# Patient Record
Sex: Male | Born: 1945
Health system: Southern US, Community
[De-identification: ages and names within clinical notes are randomized; demographics above are authoritative.]

## PROBLEM LIST (undated history)

## (undated) DIAGNOSIS — I2510 Atherosclerotic heart disease of native coronary artery without angina pectoris: Secondary | ICD-10-CM

## (undated) DIAGNOSIS — I493 Ventricular premature depolarization: Secondary | ICD-10-CM

## (undated) DIAGNOSIS — I471 Supraventricular tachycardia: Secondary | ICD-10-CM

## (undated) DIAGNOSIS — I712 Thoracic aortic aneurysm, without rupture, unspecified: Secondary | ICD-10-CM

## (undated) DIAGNOSIS — I514 Myocarditis, unspecified: Secondary | ICD-10-CM

## (undated) DIAGNOSIS — M2550 Pain in unspecified joint: Secondary | ICD-10-CM

## (undated) DIAGNOSIS — I452 Bifascicular block: Secondary | ICD-10-CM

## (undated) DIAGNOSIS — M2540 Effusion, unspecified joint: Secondary | ICD-10-CM

## (undated) DIAGNOSIS — M1990 Unspecified osteoarthritis, unspecified site: Secondary | ICD-10-CM

## (undated) DIAGNOSIS — D649 Anemia, unspecified: Secondary | ICD-10-CM

## (undated) DIAGNOSIS — I4719 Other supraventricular tachycardia: Secondary | ICD-10-CM

## (undated) DIAGNOSIS — E785 Hyperlipidemia, unspecified: Secondary | ICD-10-CM

## (undated) DIAGNOSIS — I10 Essential (primary) hypertension: Secondary | ICD-10-CM

## (undated) DIAGNOSIS — G8929 Other chronic pain: Secondary | ICD-10-CM

## (undated) DIAGNOSIS — I491 Atrial premature depolarization: Secondary | ICD-10-CM

## (undated) DIAGNOSIS — M549 Dorsalgia, unspecified: Secondary | ICD-10-CM

## (undated) HISTORY — DX: Thoracic aortic aneurysm, without rupture, unspecified: I71.20

## (undated) HISTORY — PX: COLONOSCOPY: SHX174

## (undated) HISTORY — PX: JOINT REPLACEMENT: SHX530

## (undated) HISTORY — DX: Atrial premature depolarization: I49.1

## (undated) HISTORY — PX: OTHER SURGICAL HISTORY: SHX169

## (undated) HISTORY — DX: Other supraventricular tachycardia: I47.19

## (undated) HISTORY — DX: Atherosclerotic heart disease of native coronary artery without angina pectoris: I25.10

## (undated) HISTORY — DX: Ventricular premature depolarization: I49.3

## (undated) HISTORY — DX: Supraventricular tachycardia: I47.1

## (undated) HISTORY — PX: INGUINAL HERNIA REPAIR: SUR1180

## (undated) HISTORY — DX: Myocarditis, unspecified: I51.4

## (undated) HISTORY — DX: Bifascicular block: I45.2

---

## 2007-06-27 ENCOUNTER — Ambulatory Visit (HOSPITAL_COMMUNITY): Admission: RE | Admit: 2007-06-27 | Discharge: 2007-06-27 | Payer: Self-pay | Admitting: Orthopaedic Surgery

## 2007-06-27 IMAGING — CR DG CHEST 2V
2 series · 2 of 2 positions shown · non-contrast
Comparison: None.

CLINICAL DATA: Knee osteoarthritis and synovitis. Preop respiratory exam. Hypertension. Smoker. 
 CHEST - 2 VIEW:

[view not recorded (1 of 2)]
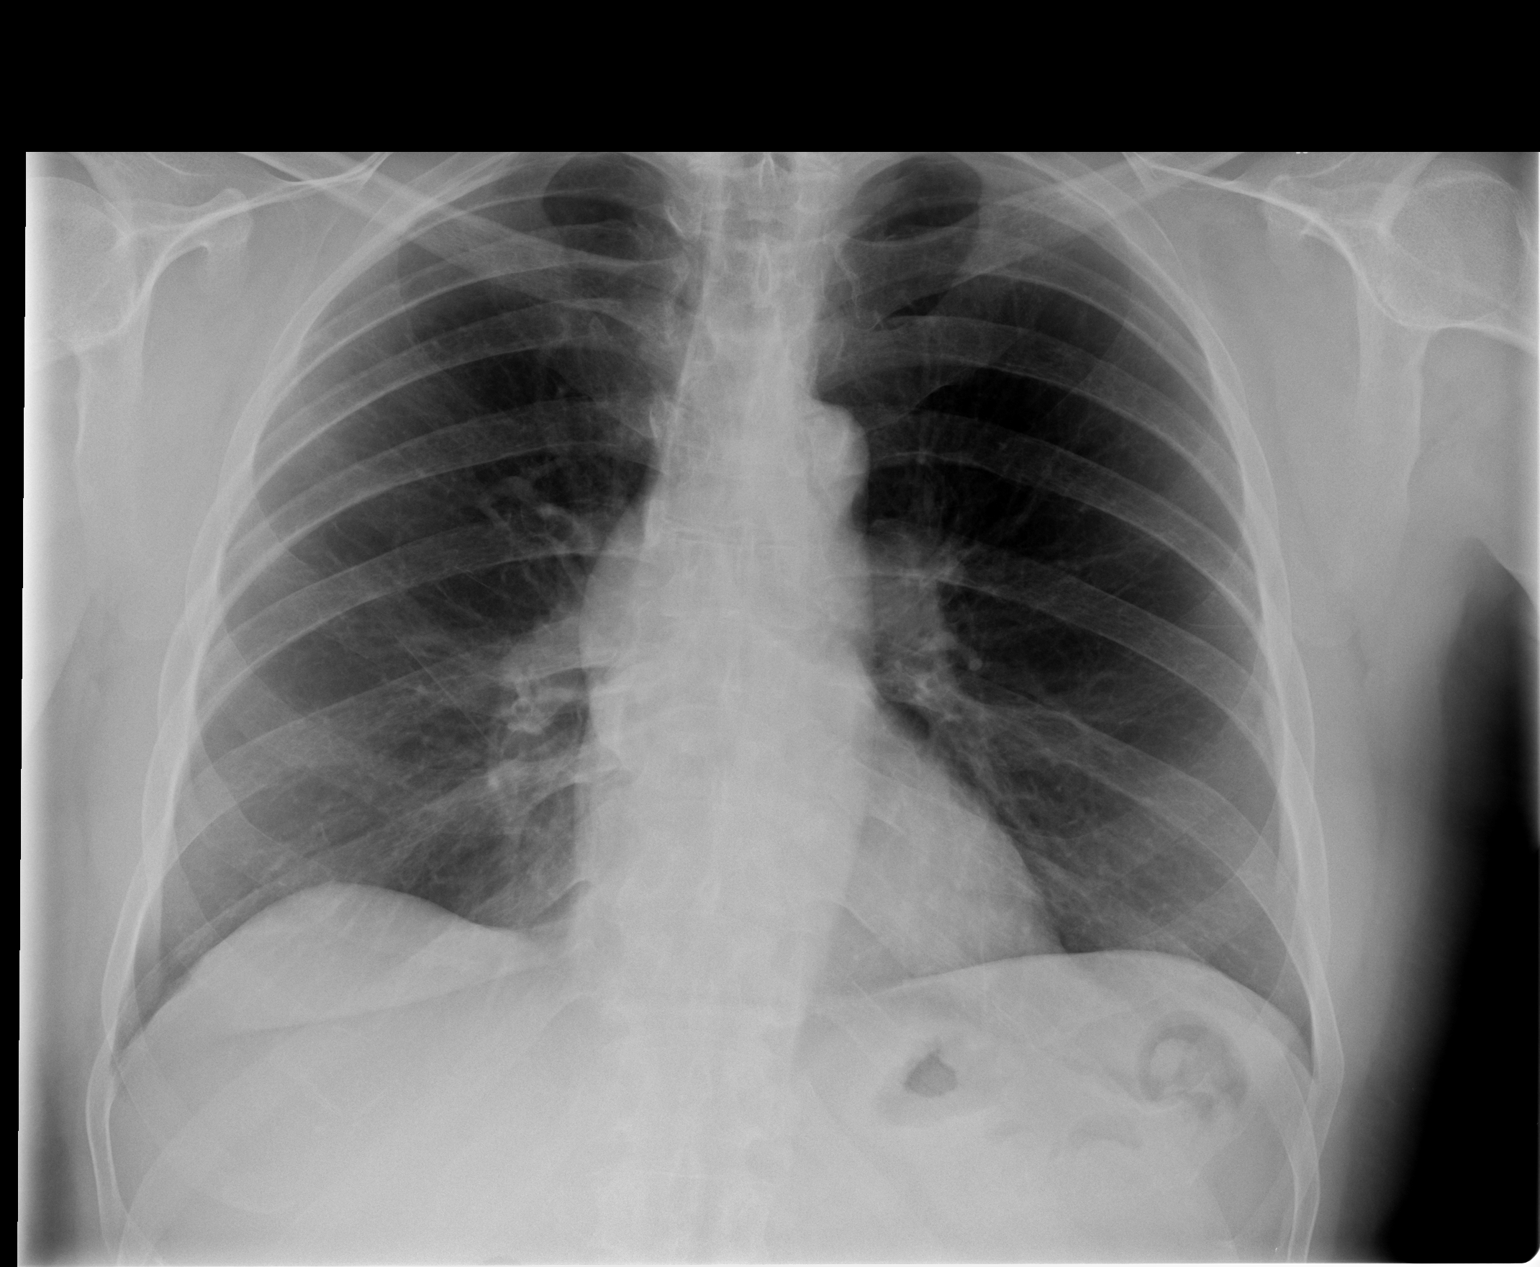

[view not recorded (2 of 2)]
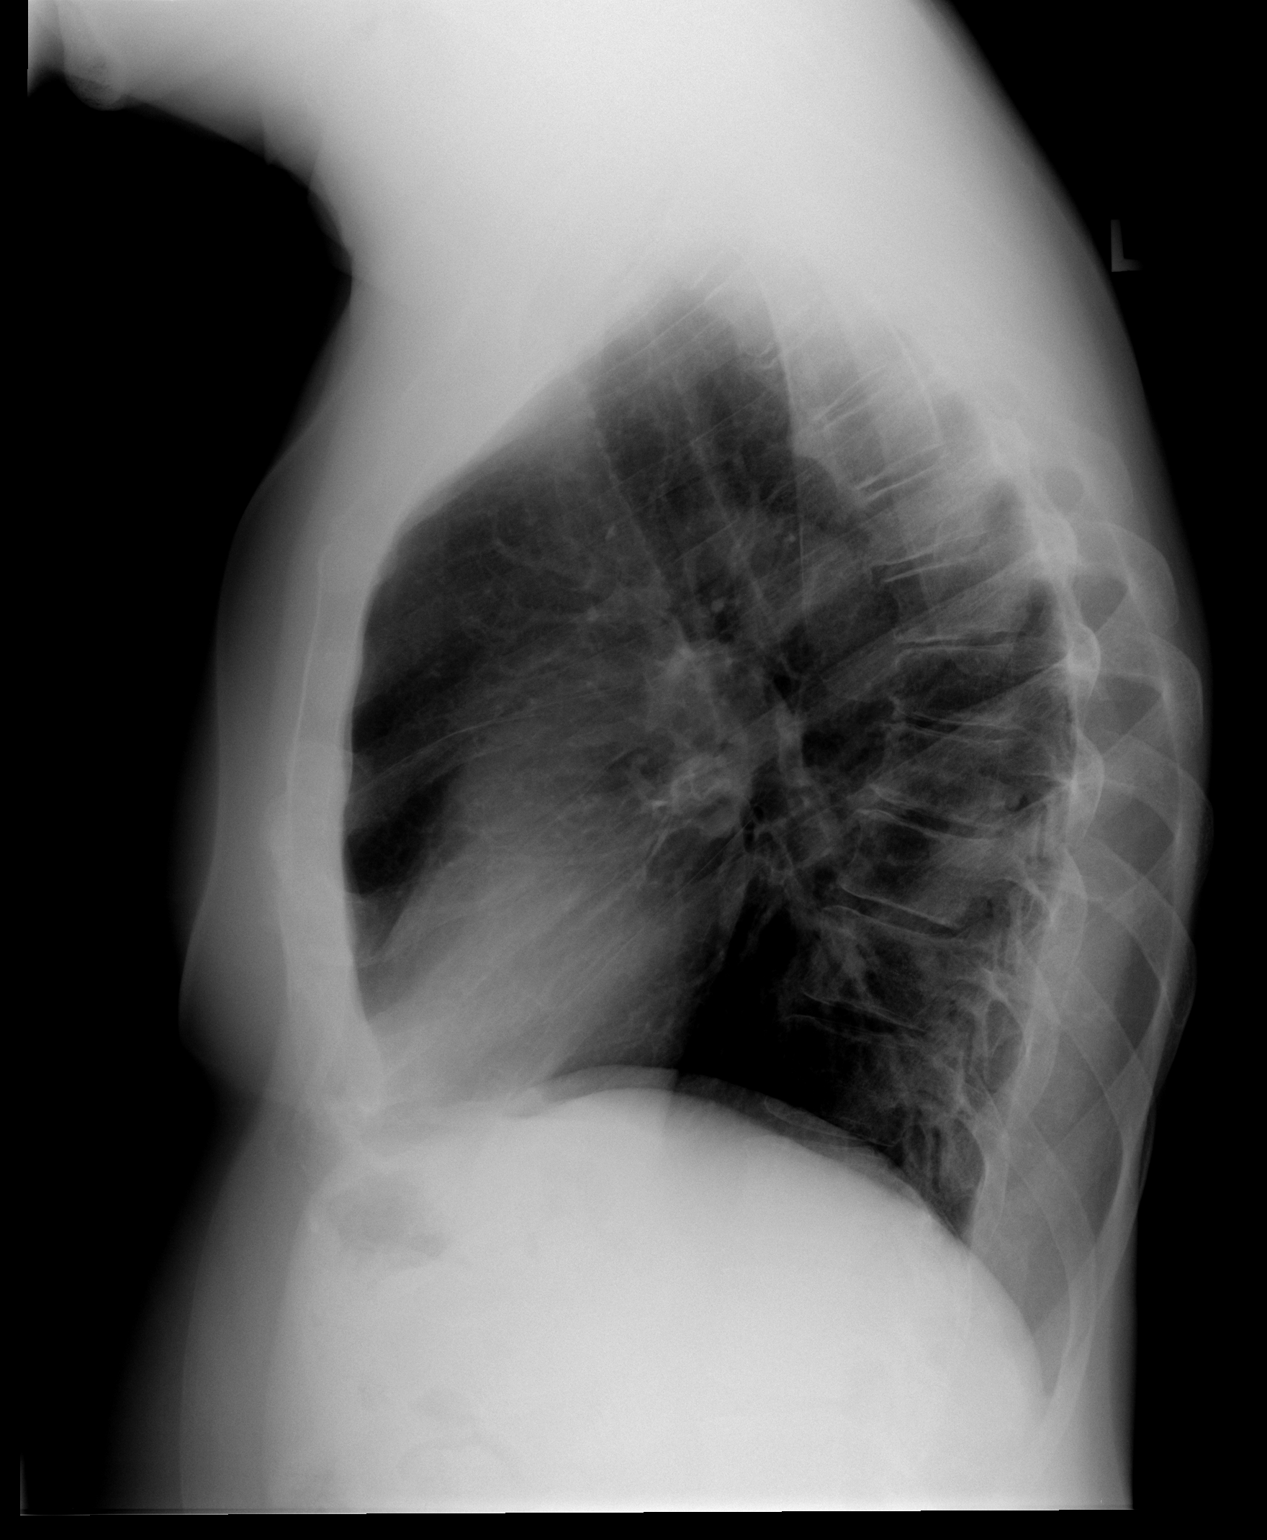

[2 of 2 positions shown; findings below may reference images not displayed]

FINDINGS: The heart size is normal. Mild tortuosity of the thoracic aorta is noted. Both lungs are clear. There is no evidence of pleural effusion. No mass or adenopathy identified.
IMPRESSION: No active cardiopulmonary disease.

## 2008-06-26 ENCOUNTER — Inpatient Hospital Stay (HOSPITAL_COMMUNITY): Admission: RE | Admit: 2008-06-26 | Discharge: 2008-06-30 | Payer: Self-pay | Admitting: Orthopaedic Surgery

## 2008-06-26 IMAGING — CR DG KNEE 1-2V PORT*R*
3 series · 3 of 3 positions shown · non-contrast
Comparison: None

CLINICAL DATA: Severe osteoarthritis of the right knee.  Status
post right knee replacement.

PORTABLE RIGHT KNEE - 1-2 VIEW

[view not recorded (1 of 3)]
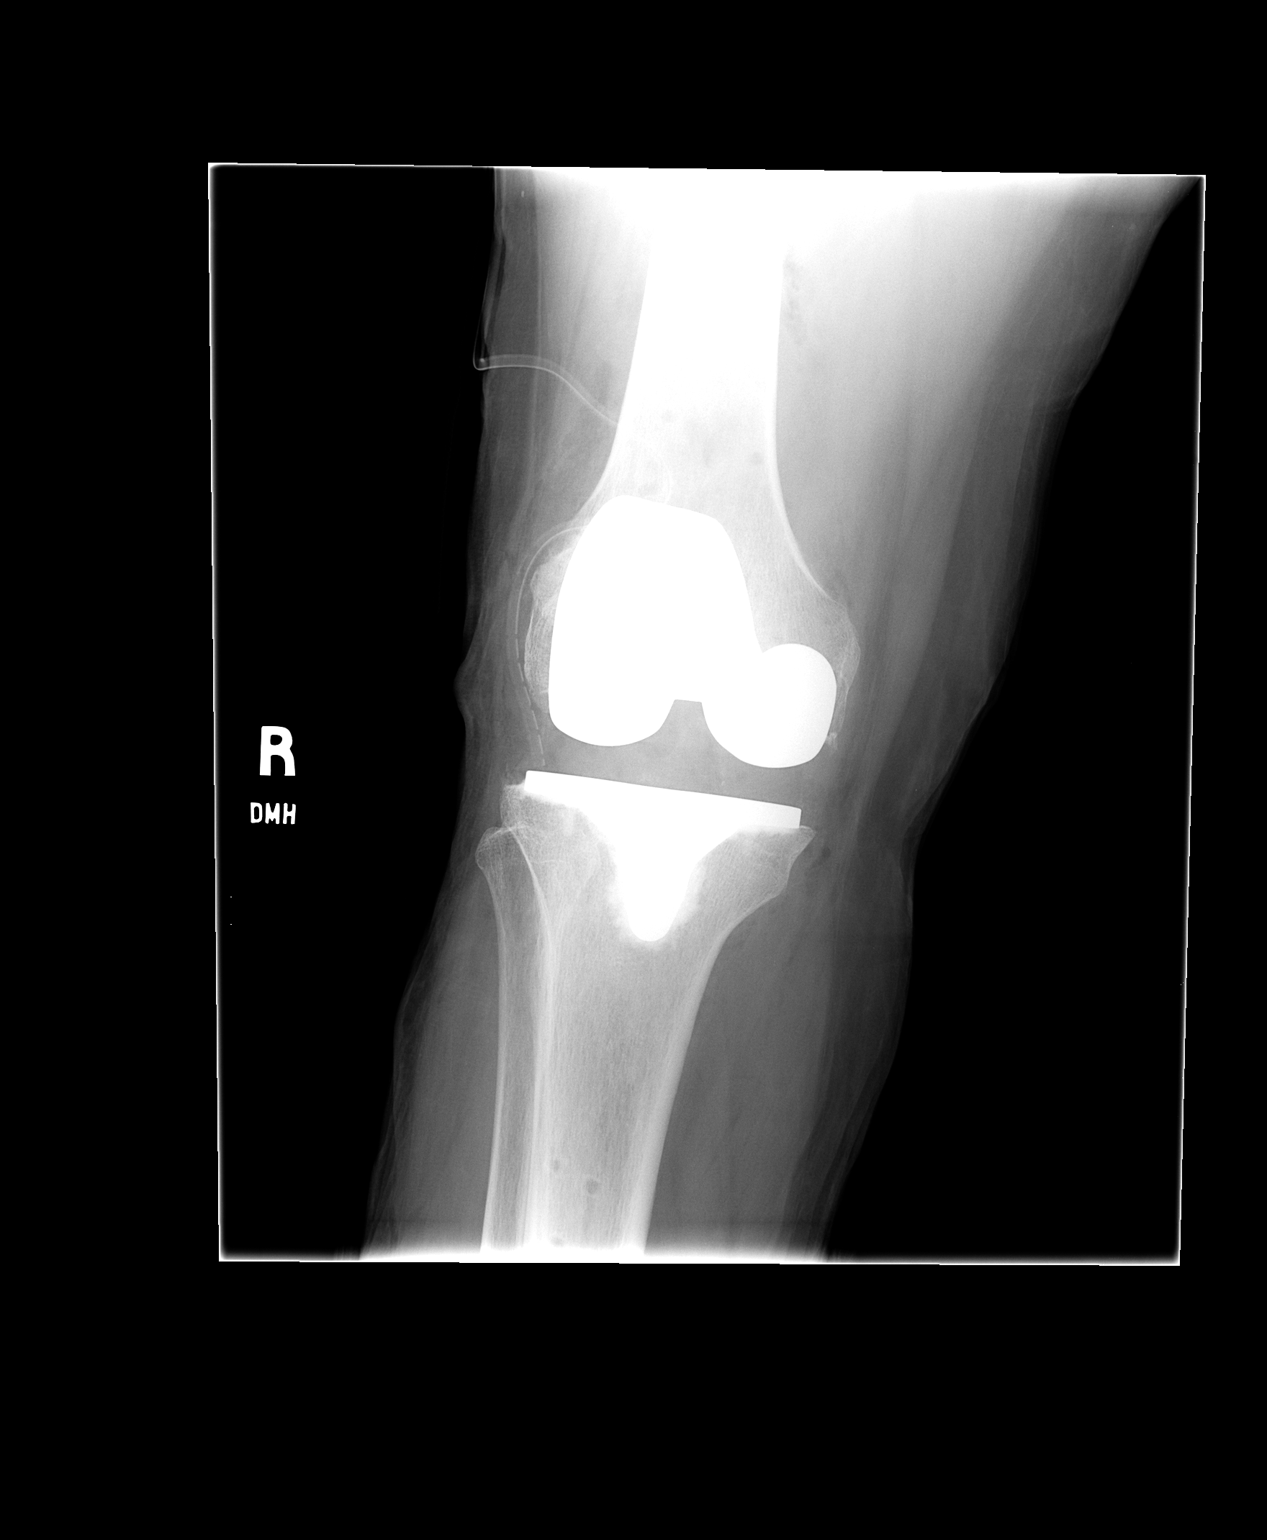

[view not recorded (2 of 3)]
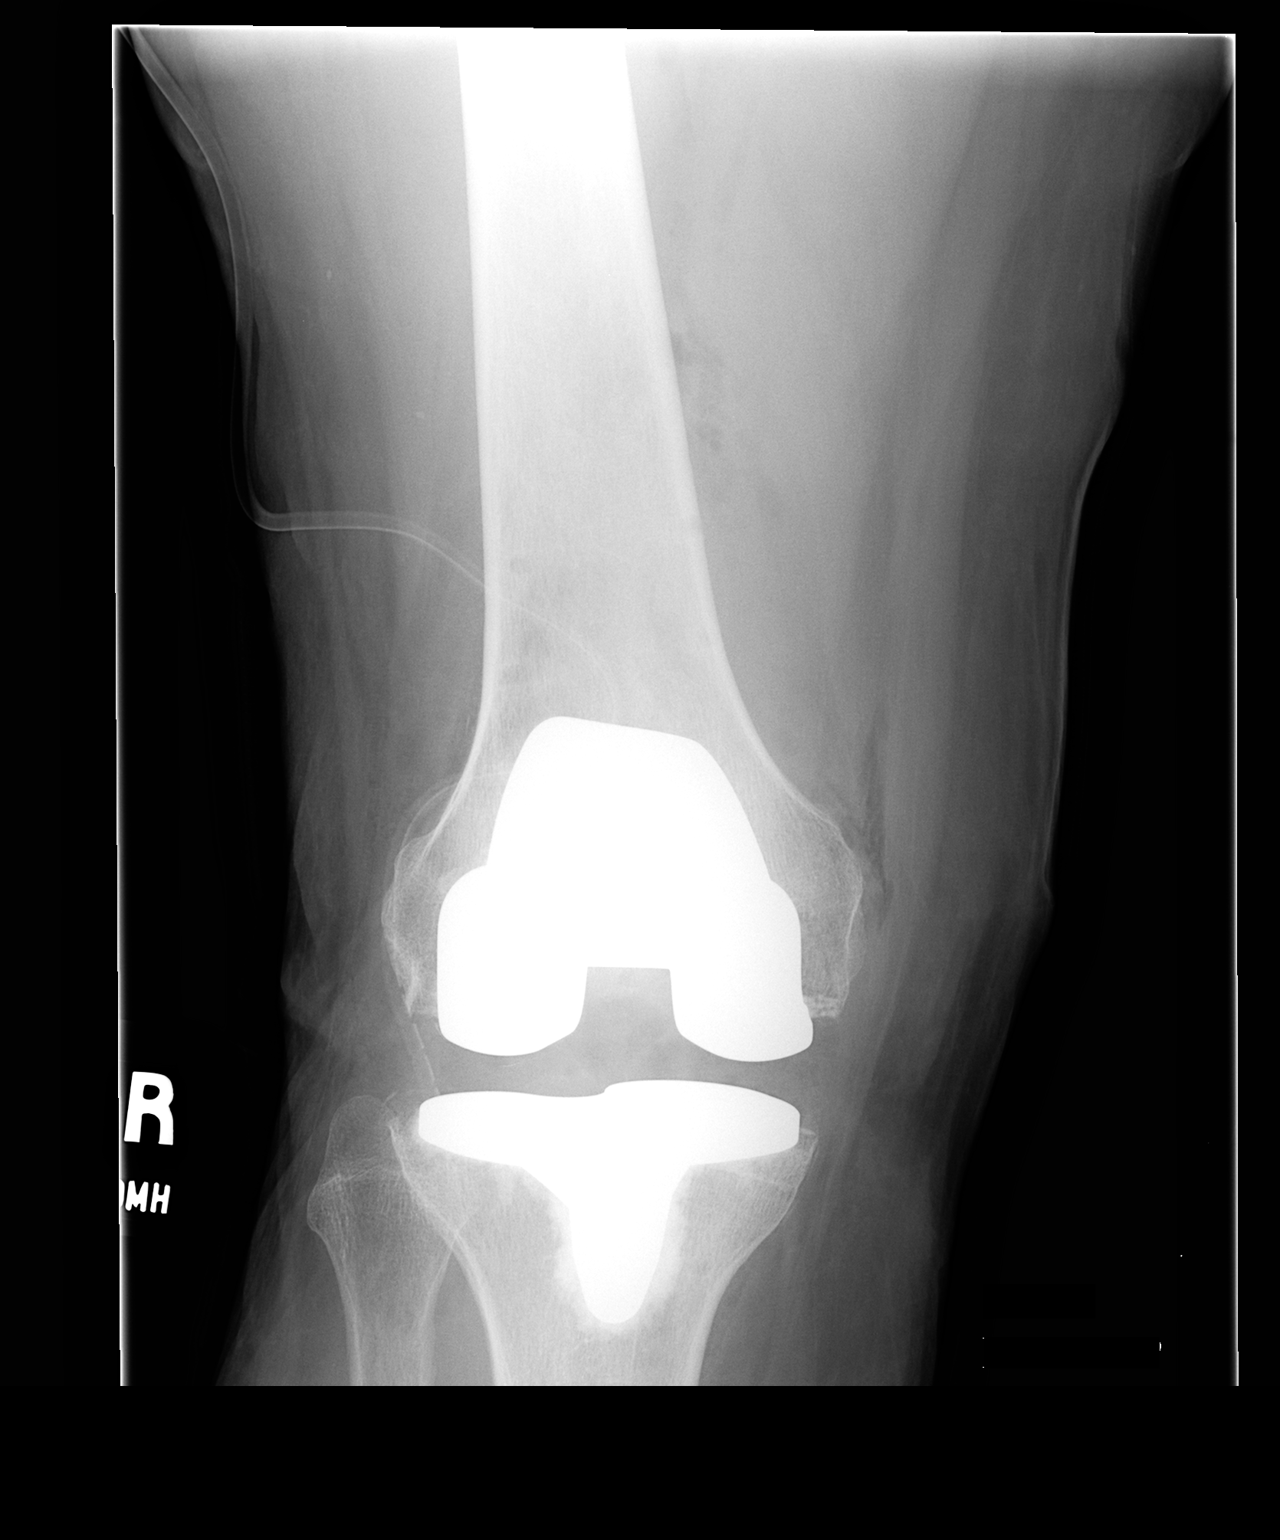

[view not recorded (3 of 3)]
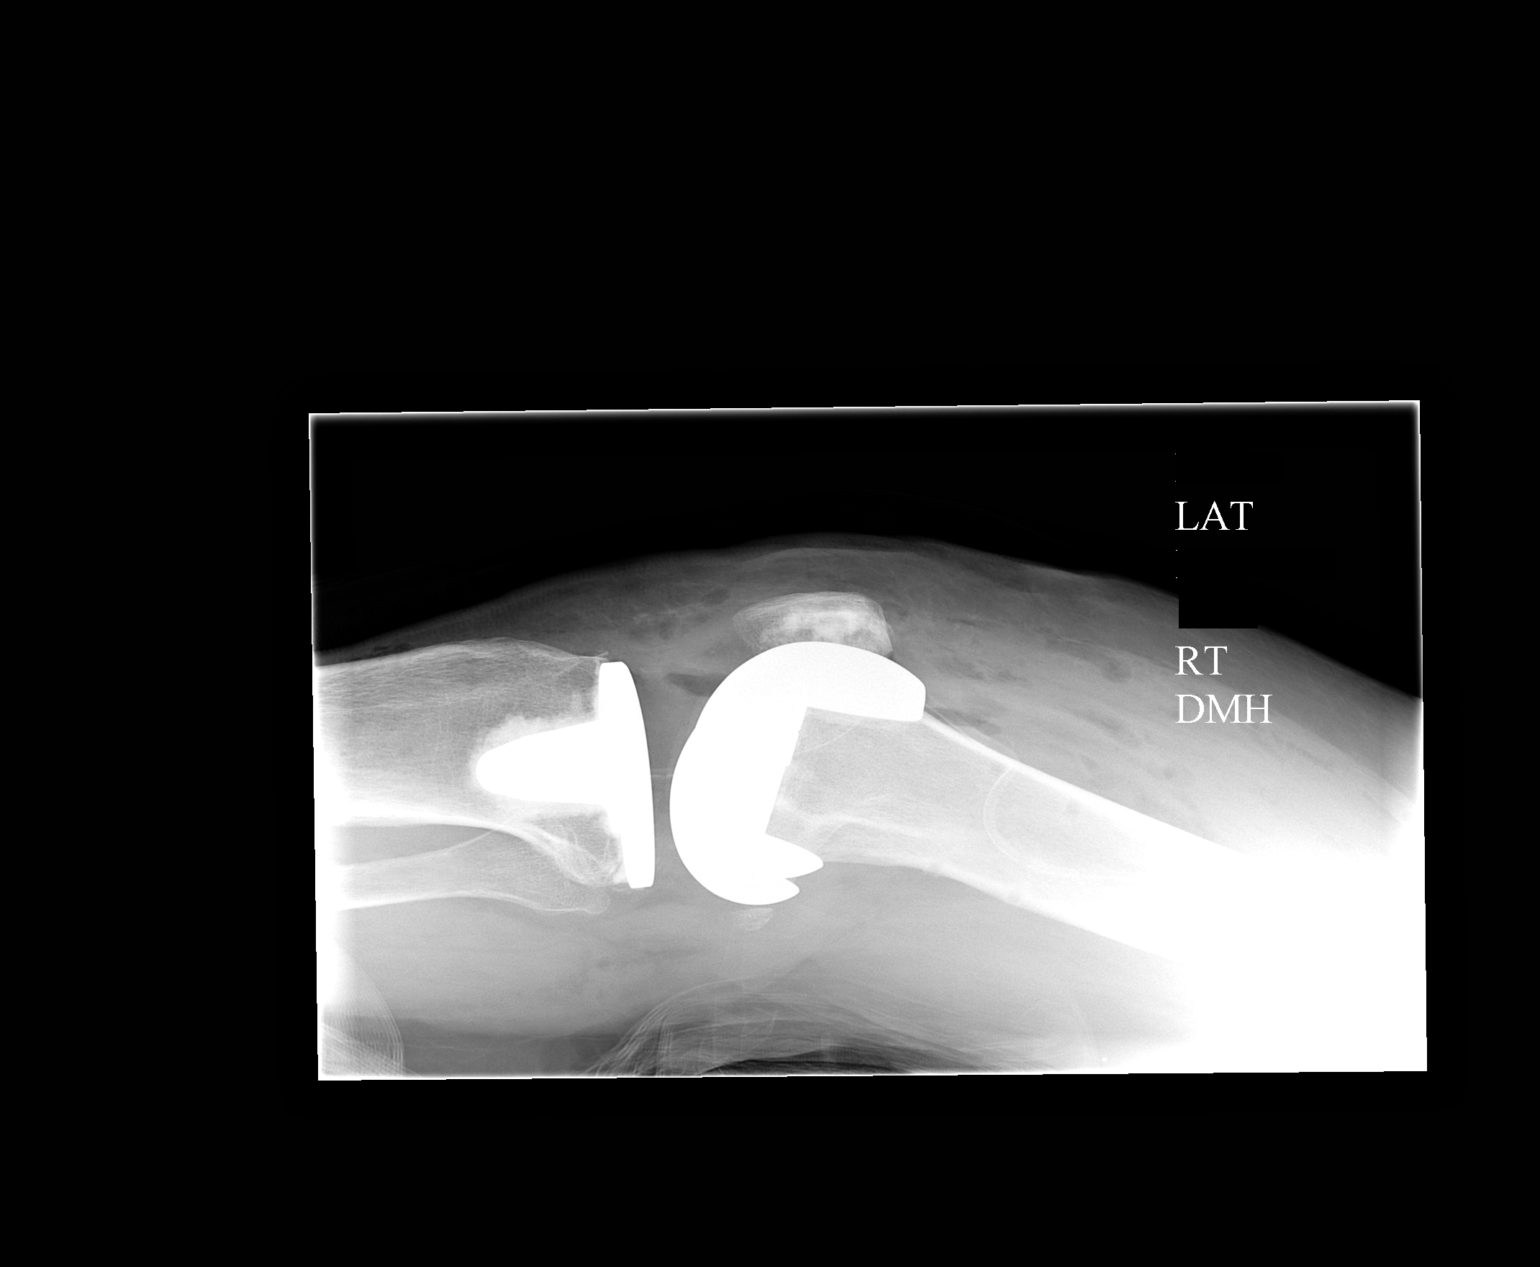

[3 of 3 positions shown; findings below may reference images not displayed]

FINDINGS: The components of the total knee replacement appear in
excellent position.  Soft tissue drain is in place.
IMPRESSION: Satisfactory appearance of the right knee after total knee
replacement.

## 2008-07-29 ENCOUNTER — Encounter: Admission: RE | Admit: 2008-07-29 | Discharge: 2008-10-22 | Payer: Self-pay | Admitting: Orthopaedic Surgery

## 2011-03-07 NOTE — Op Note (Signed)
NAME:  Ryan Craig, Ryan Craig NO.:  [id number]   MEDICAL RECORD NO.:  [id number]          PATIENT TYPE:  INP   LOCATION:  5006                         FACILITY:  [facility]   PHYSICIAN:  Ryan Craig, M.D.DATE OF BIRTH:  03-21-1946   DATE OF PROCEDURE:  06/26/2008  DATE OF DISCHARGE:                               OPERATIVE REPORT   PREOPERATIVE DIAGNOSIS:  Right knee severe degenerative joint disease  and osteoarthritis.   POSTOPERATIVE DIAGNOSIS:  Right knee severe degenerative joint disease  and osteoarthritis.   PROCEDURE:  Right total knee arthroplasty utilizing computer navigation.   IMPLANTS:  DePuy rotating platform knee with size 3 femur, size 3 tibial  tray, 12.5-mm polyethylene insert, and 35-mm polyethylene patellar  button.   SURGEON:  Ryan Panda. Magnus Ivan, MD   ASSISTANTS:  1. Maud Deed, PA-C  2. OR RNFA.   ANESTHESIA:  1. Right femoral block.  2. General.   TOURNIQUET TIME:  1 hour and 35 minutes.   ESTIMATED BLOOD LOSS:  200 mL.   COMPLICATIONS:  None.   ANTIBIOTICS:  IV Ancef 2 grams.   INDICATIONS:  Briefly, Ryan Craig is a 65 year old gentleman well known  to me.  He has had severe debilitating arthritis involving his right  knee with radiographic evidence of bone on bone wear.  Some time ago, I  did perform arthroscopic surgery on the knee to try to temporize his  pain and it helped for a while.  He then developed recurrent effusions  and I have tried to inject his knee on several occasions to get this to  calm down.  He wanted a prolong surgical intervention as long as  possible, but his pain had gotten to a point where it is greatly  affecting the activities of daily living.  It was recommended that he  undergo total knee arthroplasty.  I showed him knee model and explained  the risks and benefits of surgery including the risk of DVT and fatal PE  and he agreed to proceed with surgery after understanding these risks  and benefits.   PROCEDURE IN DETAIL:  After informed consent was obtained, the  appropriate right leg was marked.  Anesthesia was obtained with a  femoral nerve block.  He was brought to the operating room and placed  supine on the operating table.  General anesthesia was then obtained.  A  nonsterile tourniquet was placed around his right upper thigh.  His leg  was prepped and draped with DuraPrep and sterile drapes including a  sterile stockinette.  A time-out was called and he was identified as the  correct patient and the correct right knee.  I then used Esmarch to wrap  out the leg and the tourniquet was inflated to 350 mmHg pressure.  I  then made an in-line incision directly over the patella and carried this  proximally and distally.  I next performed a medial parapatellar  arthrotomy and effusion was encountered and this was suctioned out.  With the knee flexed, I then inverted the patella easily.  I cleaned  osteophytes from the femur, the tibia,  and the patella.  I removed the  ACL, PCL, and freed up tissue on the medial side due to varus deformity.  Next, I proceeded with the computer navigation portion of the case.  Through 2 small separate stab incisions, Steinmann pins were placed in  the anterior medial to posterior lateral direction of the tibia and  through the main incision several pins were put in the same direction in  the metaphysis of the femur.  I then proceed with computer navigation  portion of the case.  Once the models were made of the knee, we started  with the tibia cut.  Using computer and taking 10 mm off the high side,  I made a tibia cut.  This was with a neutral slope as well.  From this,  I was then able to feel the knee was balanced in flexion and extension  just off the tibia cut and this helped me take a distal femoral cut.  Once the cutting guide was placed in the distal femur, I made a distal  femoral cut and then I was balanced in flexion and  extension based on  these 2 cuts.  This felt good in terms of varus and valgus and the  computer showed this as well.  I then made chamfer cuts based on size 3  femur and then made the femoral box cut.  Next, I turned attention back  to the tibia.  Tibia keel and post cuts were made.  I then placed the  size 3 tibial trial followed by the size 3 femoral trial and a 10-mm  polyethylene insert.  This was felt to be unstable, so I placed a 12.5  and this was more stable.  I tried a 15, but I could not get it in, so  the 12.5 definitely felt stable.  The alignment was anatomic in terms of  flexion, extension, varus and valgus on the computer navigation.  I next  chose a size 35-mm patella.  After using calipers to measure the  patella, I measured this as size 23 and I took 40 mm off this and then  backed up with a 35-mm patella.  The holes were made for the patellar  button and I placed the trial component back in place.  With all trial  components, the knee appeared to track well with full flexion and  extension.  I then removed all trial components and copiously irrigated  the knee with pulsatile lavage using 3 L of normal saline solution.  Next, the cement was mixed and the real components were placed.  I  placed first a size 3 tibial tray which was a DePuy rotating platform  tibial tray.  The size 3 femur was placed next and I placed the real  12.5 mm polyethylene insert and cemented a 35-mm patellar button in  place.  Once the cement had dried, tourniquet was let down at 1 hour and  30 minutes and hemostasis was obtained using electrocautery.  I  irrigated the knee again.  A medium Hemovac drain was then placed  through a small lateral stab hole.  I closed the arthrotomy with  interrupted #1 Vicryl suture followed by 0 Vicryl in the deep tissue, 2-  0 Vicryl in the subcutaneous tissue, and then a running 4-0 Vicryl  subcuticular suture.  Steri-Strips were applied followed by well-padded   sterile dressing.  The Steinmann pins were removed and through the  distal tibia Steinmann pin sites were closed with  3-0 nylon interrupted  sutures.  Again under computer navigation of note, the knee appeared  straight and I was pleased with the range of motion.  After the  tourniquet was let down, again hemostasis was obtained and he had a  palpable pulse in his foot.  A well-padded sterile dressing was then  applied.  The patient was awakened, extubated, and taken to the recovery  room in stable condition.  All final counts were correct.  There were no  complications noted.      Ryan Craig, M.D.  Electronically Signed     CYB/MEDQ  D:  06/26/2008  T:  06/27/2008  Job:  [id number]

## 2011-03-07 NOTE — Discharge Summary (Signed)
NAME:  Ryan Craig, Ryan Craig NO.:  [id number]   MEDICAL RECORD NO.:  [id number]          PATIENT TYPE:  INP   LOCATION:  5006                         FACILITY:  [facility]   PHYSICIAN:  Vanita Panda. Magnus Ivan, M.D.DATE OF BIRTH:  [birth year]   DATE OF ADMISSION:  06/26/2008  DATE OF DISCHARGE:  06/30/2008                               DISCHARGE SUMMARY   ADMITTING DIAGNOSES:  Severe degenerative joint disease and  osteoarthritis, right knee.   DISCHARGE DIAGNOSES:  Severe osteoarthritis and degenerative joint  disease, right knee.   PROCEDURES:  Right total knee arthroplasty utilizing computer navigation  on April 2009.   HOSPITAL COURSE:  Briefly, Mr. Trostle is a 65 year old gentleman who was  taken to the operating room on the day of admission to undergo right  total knee arthroplasty, has previous severe degenerative joint disease  and following his right knee osteoarthritis.  He had debilitating  arthritis that did have significant impact on his activities of daily  living.  We tried to temporize this with anti-inflammatory injections  and it got to the point where he wished to proceed with total knee  replacement.  The risks and benefits were explained to him, and well  understood.  He was taken to the operating room on the day of admission  where he underwent the aforementioned procedure without complications.  For detailed description of the operation, please refer to the dictated  operative note and patient's medical record.  Postoperatively, he was  admitted to a regular orthopedic bed and started on CPM for motion of  his knee as well as DVT prophylaxis, and was started on Coumadin as  well.  Postoperatively, he began working with physical therapy and  occupation therapy and progressed along with an uneventful postoperative  course.  By the day of discharge, he was tolerating oral pain medicines  as well as oral diet.  His INR was stabilizing, and it was felt he  could  be discharged safely to home.   DISPOSITION:  To home.   DISCHARGE INSTRUCTIONS:  Advised home health, continue his CPM, and  continue working with home health physical therapy with range of motion  of his knee as well as gait training, balance, and coordination.  Home  health will also perform biweekly lab draws for adjusting his Coumadin  for a target INR of 2.0 to 3.0.  Follow up will be at the office in 2  weeks.   DISCHARGE MEDICATIONS:  1. Accupril.  2. Amlodipine.  3. Aspirin.  4. Lipitor.  5. Coumadin.  6. Oxycodone.  7. Robaxin.      Vanita Panda. Magnus Ivan, M.D.  Electronically Signed     CYB/MEDQ  D:  07/28/2008  T:  07/29/2008  Job:  [id number]

## 2011-03-07 NOTE — Op Note (Signed)
NAME:  TAVIOUS, GRIESINGER NO.:  [id number]   MEDICAL RECORD NO.:  [id number]          PATIENT TYPE:  AMB   LOCATION:  SDS                          FACILITY:  [facility]   PHYSICIAN:  Vanita Panda. Magnus Ivan, M.D.DATE OF BIRTH:  [birth year]   DATE OF PROCEDURE:  06/27/2007  DATE OF DISCHARGE:                               OPERATIVE REPORT   PREOPERATIVE DIAGNOSIS:  Right knee internal derangement with likely  chronic medial meniscal tearing and degenerative joint disease.   POSTOPERATIVE DIAGNOSES:  1. 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  2. 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   PROCEDURE:  1. Right knee arthroscopy with debridement.  2. Right knee arthroscopic chondroplasty medial femoral condyle and      patella.  3. Right knee arthroscopic partial medial meniscectomy.   SURGEON:  Doneen Poisson, MD   ANESTHESIA:  General.   ANTIBIOTICS:  1 gram IV Ancef.   BLOOD LOSS:  Minimal.   COMPLICATIONS:  None.   INDICATION:  Briefly Mr. Mcmichael is a 65 year old with exquisite medial  sided joint pain of his right knee.  I tried aspirating effusion several  times of the knee and placed a steroid injection and his pain was  intensified.  His mobility was excellent and it seems like more of his  pain is focused on the medial side.  It was recommended he undergo  arthroscopic surgery with debridement of the medial meniscus.  The risks  and benefits of this were explained to him and well understood.  He  agreed to proceed with surgery.   PROCEDURE DESCRIPTION:  After informed was obtained, the appropriate  right leg was marked.  He was brought to the operating room, placed  supine on the operating table.  General anesthesia was then obtained.  His right leg was prepped and draped with DuraPrep and sterile drapes.  A nonsterile tourniquet placed on his upper thigh but was never utilized  during the case.  A sterile stockinette  was used as well.  With the bed  raised and the knee flexed off side of the table, a lateral leg post was  utilized.  A time-out was called and he was identified as correct  patient correct extremity.  I then made anterior lateral portal just  lateral to the patellar tendon at level of inferior pole patella and our  arthroscopic cannula was inserted.  A large effusion was aspirated from  the knee.  I then put the camera in the knee and went to the medial side  and created a anterior medial portal just medial to the patellar tendon  at the same level.  Right away could see there was grade 4  chondromalacia changes of medial femoral condyle and chronic tearing  involving the entire posterior horn up to the midbody of the meniscus.  Up cutting biters and arthroscopic shaver were inserted through the  medial portal and extensive partial medial meniscectomy was carried out  and I debrided the meniscus back to stable margin.  I also used the  shaver to perform a chondroplasty of loose cartilage edges  on the medial  femoral condyle and was able to remove the loose body as well.  The ACL  was assessed and found to be intact and then went to the lateral  compartment in a figure 4 position and was able to see that the lateral  meniscus and lateral cartilage was intact.  Finally with the knee  extended, there was some just mild grade 2 to grade 3 chondromalacia  changes of the patella and I debrided this as well.  The trochlear  groove itself had minimal changes.  I then allowed fluid to lavage  through the knee and removed all instrumentation as well as aspirated  effusion from the knee.  The arthroscopy portal sites was closed with  interrupted 3-0 nylon suture with one suture in each portal site.  The  knee was then infiltrated with a mixture of 4 mg of morphine and then  0.5% Marcaine epinephrine mixture and Xeroform followed by sterile  dressing and ice pack were placed on the knee.  The patient  was  awakened, extubated and taken to recovery room in stable condition.  All  final counts were correct and there were no complications noted.  Postoperatively I will allow him to weight bear as tolerated and slowly  increase activities.      Vanita Panda. Magnus Ivan, M.D.  Electronically Signed     CYB/MEDQ  D:  06/27/2007  T:  06/27/2007  Job:  [id number]

## 2011-07-26 LAB — CBC
HCT: 24.2 — ABNORMAL LOW
HCT: 25.7 — ABNORMAL LOW
HCT: 26.6 — ABNORMAL LOW
HCT: 30.9 — ABNORMAL LOW
HCT: 36.9 — ABNORMAL LOW
Hemoglobin: 10.4 — ABNORMAL LOW
Hemoglobin: 12.4 — ABNORMAL LOW
Hemoglobin: 8.2 — ABNORMAL LOW
Hemoglobin: 8.6 — ABNORMAL LOW
Hemoglobin: 8.9 — ABNORMAL LOW
MCHC: 33.5
MCHC: 33.5
MCHC: 33.7
MCHC: 33.7
MCHC: 34
MCV: 88
MCV: 88.6
MCV: 88.9
MCV: 89.1
MCV: 90.1
Platelets: 173
Platelets: 176
Platelets: 190
Platelets: 226
Platelets: 259
RBC: 2.74 — ABNORMAL LOW
RBC: 2.89 — ABNORMAL LOW
RBC: 2.95 — ABNORMAL LOW
RBC: 3.49 — ABNORMAL LOW
RBC: 4.14 — ABNORMAL LOW
RDW: 12.6
RDW: 12.9
RDW: 12.9
RDW: 13
RDW: 13.1
WBC: 10
WBC: 10
WBC: 11.3 — ABNORMAL HIGH
WBC: 7.2
WBC: 9.2

## 2011-07-26 LAB — PROTIME-INR
INR: 1
INR: 1
INR: 1.3
INR: 1.8 — ABNORMAL HIGH
INR: 2.4 — ABNORMAL HIGH
Prothrombin Time: 13.5
Prothrombin Time: 13.8
Prothrombin Time: 16.5 — ABNORMAL HIGH
Prothrombin Time: 22.3 — ABNORMAL HIGH
Prothrombin Time: 27.9 — ABNORMAL HIGH

## 2011-07-26 LAB — BASIC METABOLIC PANEL
BUN: 11
BUN: 15
BUN: 7
BUN: 8
CO2: 23
CO2: 23
CO2: 25
CO2: 26
Calcium: 8.5
Calcium: 8.5
Calcium: 8.6
Calcium: 9.7
Chloride: 108
Chloride: 109
Chloride: 110
Chloride: 111
Creatinine, Ser: 0.79
Creatinine, Ser: 0.81
Creatinine, Ser: 0.82
Creatinine, Ser: 0.96
GFR calc Af Amer: >60
GFR calc Af Amer: >60
GFR calc Af Amer: >60
GFR calc Af Amer: >60
GFR calc non Af Amer: >60
GFR calc non Af Amer: >60
GFR calc non Af Amer: >60
GFR calc non Af Amer: >60
Glucose, Bld: 105 — ABNORMAL HIGH
Glucose, Bld: 112 — ABNORMAL HIGH
Glucose, Bld: 119 — ABNORMAL HIGH
Glucose, Bld: 120 — ABNORMAL HIGH
Potassium: 3.5
Potassium: 3.8
Potassium: 4
Potassium: 4.9
Sodium: 138
Sodium: 139
Sodium: 140
Sodium: 142

## 2011-08-04 LAB — CBC
HCT: 37.2 — ABNORMAL LOW
Hemoglobin: 12.5 — ABNORMAL LOW
MCHC: 33.6
MCV: 89
Platelets: 374
RBC: 4.18 — ABNORMAL LOW
RDW: 13.1
WBC: 9.9

## 2011-08-04 LAB — BASIC METABOLIC PANEL
BUN: 31 — ABNORMAL HIGH
CO2: 24
Calcium: 9.8
Chloride: 103
Creatinine, Ser: 1.32
GFR calc Af Amer: >60
GFR calc non Af Amer: 55 — ABNORMAL LOW
Glucose, Bld: 118 — ABNORMAL HIGH
Potassium: 5
Sodium: 135

## 2011-10-25 DIAGNOSIS — M25569 Pain in unspecified knee: Secondary | ICD-10-CM | POA: Diagnosis not present

## 2011-12-25 DIAGNOSIS — M25569 Pain in unspecified knee: Secondary | ICD-10-CM | POA: Diagnosis not present

## 2012-01-18 DIAGNOSIS — M545 Low back pain, unspecified: Secondary | ICD-10-CM | POA: Diagnosis not present

## 2012-01-18 DIAGNOSIS — M5126 Other intervertebral disc displacement, lumbar region: Secondary | ICD-10-CM | POA: Diagnosis not present

## 2012-01-18 DIAGNOSIS — M542 Cervicalgia: Secondary | ICD-10-CM | POA: Diagnosis not present

## 2012-02-08 DIAGNOSIS — M545 Low back pain, unspecified: Secondary | ICD-10-CM | POA: Diagnosis not present

## 2012-03-07 DIAGNOSIS — I10 Essential (primary) hypertension: Secondary | ICD-10-CM | POA: Diagnosis not present

## 2012-03-07 DIAGNOSIS — E785 Hyperlipidemia, unspecified: Secondary | ICD-10-CM | POA: Diagnosis not present

## 2012-03-07 DIAGNOSIS — Z1212 Encounter for screening for malignant neoplasm of rectum: Secondary | ICD-10-CM | POA: Diagnosis not present

## 2012-03-07 DIAGNOSIS — R7309 Other abnormal glucose: Secondary | ICD-10-CM | POA: Diagnosis not present

## 2012-03-07 DIAGNOSIS — Z96659 Presence of unspecified artificial knee joint: Secondary | ICD-10-CM | POA: Diagnosis not present

## 2012-03-07 DIAGNOSIS — Z79899 Other long term (current) drug therapy: Secondary | ICD-10-CM | POA: Diagnosis not present

## 2012-03-07 DIAGNOSIS — Z0000 Encounter for general adult medical examination without abnormal findings: Secondary | ICD-10-CM | POA: Diagnosis not present

## 2012-03-07 DIAGNOSIS — Z125 Encounter for screening for malignant neoplasm of prostate: Secondary | ICD-10-CM | POA: Diagnosis not present

## 2012-03-07 DIAGNOSIS — E559 Vitamin D deficiency, unspecified: Secondary | ICD-10-CM | POA: Diagnosis not present

## 2012-03-25 DIAGNOSIS — M25569 Pain in unspecified knee: Secondary | ICD-10-CM | POA: Diagnosis not present

## 2012-03-26 ENCOUNTER — Other Ambulatory Visit (HOSPITAL_COMMUNITY): Payer: Self-pay | Admitting: Orthopaedic Surgery

## 2012-03-26 DIAGNOSIS — M549 Dorsalgia, unspecified: Secondary | ICD-10-CM

## 2012-03-26 DIAGNOSIS — T84039A Mechanical loosening of unspecified internal prosthetic joint, initial encounter: Secondary | ICD-10-CM

## 2012-03-26 DIAGNOSIS — M2341 Loose body in knee, right knee: Secondary | ICD-10-CM

## 2012-03-27 DIAGNOSIS — M5126 Other intervertebral disc displacement, lumbar region: Secondary | ICD-10-CM | POA: Diagnosis not present

## 2012-03-27 DIAGNOSIS — M545 Low back pain, unspecified: Secondary | ICD-10-CM | POA: Diagnosis not present

## 2012-04-03 ENCOUNTER — Other Ambulatory Visit (HOSPITAL_COMMUNITY): Payer: Self-pay

## 2012-04-03 ENCOUNTER — Encounter (HOSPITAL_COMMUNITY): Payer: Self-pay

## 2012-04-04 ENCOUNTER — Encounter (HOSPITAL_COMMUNITY)
Admission: RE | Admit: 2012-04-04 | Discharge: 2012-04-04 | Disposition: A | Discharge status: Home / Self Care | Payer: Medicare Other | Source: Ambulatory Visit | Attending: Orthopaedic Surgery | Admitting: Orthopaedic Surgery

## 2012-04-04 DIAGNOSIS — M25569 Pain in unspecified knee: Secondary | ICD-10-CM | POA: Insufficient documentation

## 2012-04-04 DIAGNOSIS — T84039A Mechanical loosening of unspecified internal prosthetic joint, initial encounter: Secondary | ICD-10-CM

## 2012-04-04 DIAGNOSIS — M549 Dorsalgia, unspecified: Secondary | ICD-10-CM | POA: Insufficient documentation

## 2012-04-04 DIAGNOSIS — Z96659 Presence of unspecified artificial knee joint: Secondary | ICD-10-CM | POA: Insufficient documentation

## 2012-04-04 DIAGNOSIS — M2341 Loose body in knee, right knee: Secondary | ICD-10-CM

## 2012-04-04 IMAGING — NM NM BONE WHOLE BODY
2 series · 12 of 12 positions shown · non-contrast
Comparison: None

CLINICAL DATA: Knee pain.  Evaluate for loosening of the
prosthesis.  Back pain.  Question of fracture.

NUCLEAR MEDICINE WHOLE BODY BONE SCINTIGRAPHY
TECHNIQUE: Whole body anterior and posterior images were obtained
approximately 3 hours after intravenous injection of
radiopharmaceutical.
Radiopharmaceutical: [DE] KEI TECHNETIUM TC 99M
MEDRONATE IV KIT

[Series 1: fl flow and static · 4.75mm/px · 6 of 40 frames shown (1 of 2)]
[frame 4/40  full-range]
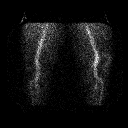
[frame 10/40  full-range]
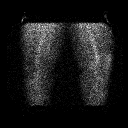
[frame 17/40  full-range]
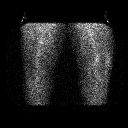
[frame 24/40  full-range]
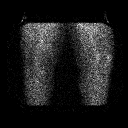
[frame 30/40  full-range]
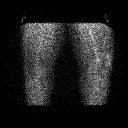
[frame 37/40  full-range]
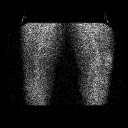

[Series 1: fl flow and static · 4.75mm/px · 6 of 40 frames shown (2 of 2)]
[frame 4/40  full-range]
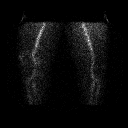
[frame 10/40  full-range]
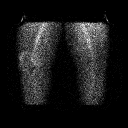
[frame 17/40  full-range]
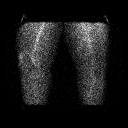
[frame 24/40  full-range]
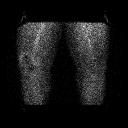
[frame 30/40  full-range]
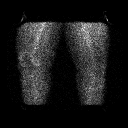
[frame 37/40  full-range]
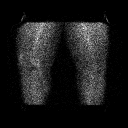

[12 of 12 positions shown; findings below may reference images not displayed]

FINDINGS: On the blood flow and perfusion images, there is no
asymmetric flow in the region of the knees.

On static images of the knees, there is increased activity in the
region of the right knee prosthesis, suggesting loosening or
infection.  Mild increased activity the region of the left knee
favors degenerative changes.

Whole body images were also performed, showing degenerative changes
in the spine.  No evidence for fracture or bone lesion.

Bilateral renal activity and bladder activity are present.
IMPRESSION: 1.  Activity in the region of the right knee prosthesis, suggesting
loosening or infection.
2.  No evidence for fracture or bone lesion within the spine.
There is activity consistent with degenerative changes in the mid
thoracic levels.

## 2012-04-04 MED ORDER — TECHNETIUM TC 99M MEDRONATE IV KIT
23.9000 | PACK | Freq: Once | INTRAVENOUS | Status: AC | PRN
Start: 2012-04-04 — End: 2012-04-04
  Administered 2012-04-04: 23.9 via INTRAVENOUS

## 2012-04-17 DIAGNOSIS — M25569 Pain in unspecified knee: Secondary | ICD-10-CM | POA: Diagnosis not present

## 2012-05-02 DIAGNOSIS — M5126 Other intervertebral disc displacement, lumbar region: Secondary | ICD-10-CM | POA: Diagnosis not present

## 2012-05-02 DIAGNOSIS — M25569 Pain in unspecified knee: Secondary | ICD-10-CM | POA: Diagnosis not present

## 2012-06-25 DIAGNOSIS — E785 Hyperlipidemia, unspecified: Secondary | ICD-10-CM | POA: Diagnosis not present

## 2012-06-25 DIAGNOSIS — Z79899 Other long term (current) drug therapy: Secondary | ICD-10-CM | POA: Diagnosis not present

## 2012-06-25 DIAGNOSIS — R6889 Other general symptoms and signs: Secondary | ICD-10-CM | POA: Diagnosis not present

## 2012-07-03 DIAGNOSIS — M66329 Spontaneous rupture of flexor tendons, unspecified upper arm: Secondary | ICD-10-CM | POA: Diagnosis not present

## 2012-07-03 DIAGNOSIS — M25569 Pain in unspecified knee: Secondary | ICD-10-CM | POA: Diagnosis not present

## 2012-09-09 DIAGNOSIS — I10 Essential (primary) hypertension: Secondary | ICD-10-CM | POA: Diagnosis not present

## 2012-09-09 DIAGNOSIS — Z79899 Other long term (current) drug therapy: Secondary | ICD-10-CM | POA: Diagnosis not present

## 2012-09-09 DIAGNOSIS — R6889 Other general symptoms and signs: Secondary | ICD-10-CM | POA: Diagnosis not present

## 2012-09-09 DIAGNOSIS — R7989 Other specified abnormal findings of blood chemistry: Secondary | ICD-10-CM | POA: Diagnosis not present

## 2012-09-09 DIAGNOSIS — E785 Hyperlipidemia, unspecified: Secondary | ICD-10-CM | POA: Diagnosis not present

## 2012-09-12 DIAGNOSIS — M66329 Spontaneous rupture of flexor tendons, unspecified upper arm: Secondary | ICD-10-CM | POA: Diagnosis not present

## 2012-09-30 DIAGNOSIS — M66329 Spontaneous rupture of flexor tendons, unspecified upper arm: Secondary | ICD-10-CM | POA: Diagnosis not present

## 2012-09-30 DIAGNOSIS — M25569 Pain in unspecified knee: Secondary | ICD-10-CM | POA: Diagnosis not present

## 2013-01-09 DIAGNOSIS — I10 Essential (primary) hypertension: Secondary | ICD-10-CM | POA: Diagnosis not present

## 2013-01-09 DIAGNOSIS — Z79899 Other long term (current) drug therapy: Secondary | ICD-10-CM | POA: Diagnosis not present

## 2013-01-09 DIAGNOSIS — E785 Hyperlipidemia, unspecified: Secondary | ICD-10-CM | POA: Diagnosis not present

## 2013-04-22 DIAGNOSIS — S83419A Sprain of medial collateral ligament of unspecified knee, initial encounter: Secondary | ICD-10-CM | POA: Diagnosis not present

## 2013-04-22 DIAGNOSIS — M7040 Prepatellar bursitis, unspecified knee: Secondary | ICD-10-CM | POA: Diagnosis not present

## 2013-04-22 DIAGNOSIS — M25569 Pain in unspecified knee: Secondary | ICD-10-CM | POA: Diagnosis not present

## 2013-04-24 DIAGNOSIS — E559 Vitamin D deficiency, unspecified: Secondary | ICD-10-CM | POA: Diagnosis not present

## 2013-04-24 DIAGNOSIS — Z79899 Other long term (current) drug therapy: Secondary | ICD-10-CM | POA: Diagnosis not present

## 2013-04-24 DIAGNOSIS — Z1212 Encounter for screening for malignant neoplasm of rectum: Secondary | ICD-10-CM | POA: Diagnosis not present

## 2013-04-24 DIAGNOSIS — I10 Essential (primary) hypertension: Secondary | ICD-10-CM | POA: Diagnosis not present

## 2013-04-24 DIAGNOSIS — Z0100 Encounter for examination of eyes and vision without abnormal findings: Secondary | ICD-10-CM | POA: Diagnosis not present

## 2013-04-24 DIAGNOSIS — E785 Hyperlipidemia, unspecified: Secondary | ICD-10-CM | POA: Diagnosis not present

## 2013-04-24 DIAGNOSIS — R7309 Other abnormal glucose: Secondary | ICD-10-CM | POA: Diagnosis not present

## 2013-04-24 DIAGNOSIS — Z23 Encounter for immunization: Secondary | ICD-10-CM | POA: Diagnosis not present

## 2013-04-24 DIAGNOSIS — Z125 Encounter for screening for malignant neoplasm of prostate: Secondary | ICD-10-CM | POA: Diagnosis not present

## 2013-04-24 DIAGNOSIS — Z0000 Encounter for general adult medical examination without abnormal findings: Secondary | ICD-10-CM | POA: Diagnosis not present

## 2013-04-29 DIAGNOSIS — K7689 Other specified diseases of liver: Secondary | ICD-10-CM | POA: Diagnosis not present

## 2013-04-29 DIAGNOSIS — R7989 Other specified abnormal findings of blood chemistry: Secondary | ICD-10-CM | POA: Diagnosis not present

## 2013-04-29 DIAGNOSIS — R109 Unspecified abdominal pain: Secondary | ICD-10-CM | POA: Diagnosis not present

## 2013-06-03 DIAGNOSIS — M25569 Pain in unspecified knee: Secondary | ICD-10-CM | POA: Diagnosis not present

## 2013-10-06 DIAGNOSIS — M25469 Effusion, unspecified knee: Secondary | ICD-10-CM | POA: Diagnosis not present

## 2013-10-06 DIAGNOSIS — M25569 Pain in unspecified knee: Secondary | ICD-10-CM | POA: Diagnosis not present

## 2013-10-31 DIAGNOSIS — M1710 Unilateral primary osteoarthritis, unspecified knee: Secondary | ICD-10-CM | POA: Diagnosis not present

## 2013-10-31 DIAGNOSIS — I10 Essential (primary) hypertension: Secondary | ICD-10-CM | POA: Diagnosis not present

## 2013-10-31 DIAGNOSIS — Z79899 Other long term (current) drug therapy: Secondary | ICD-10-CM | POA: Diagnosis not present

## 2013-10-31 DIAGNOSIS — IMO0002 Reserved for concepts with insufficient information to code with codable children: Secondary | ICD-10-CM | POA: Diagnosis not present

## 2013-10-31 DIAGNOSIS — E785 Hyperlipidemia, unspecified: Secondary | ICD-10-CM | POA: Diagnosis not present

## 2013-11-03 DIAGNOSIS — M25469 Effusion, unspecified knee: Secondary | ICD-10-CM | POA: Diagnosis not present

## 2013-11-03 DIAGNOSIS — M25569 Pain in unspecified knee: Secondary | ICD-10-CM | POA: Diagnosis not present

## 2013-11-13 DIAGNOSIS — M1710 Unilateral primary osteoarthritis, unspecified knee: Secondary | ICD-10-CM | POA: Diagnosis not present

## 2013-11-17 DIAGNOSIS — M25569 Pain in unspecified knee: Secondary | ICD-10-CM | POA: Diagnosis not present

## 2014-02-20 DIAGNOSIS — M23329 Other meniscus derangements, posterior horn of medial meniscus, unspecified knee: Secondary | ICD-10-CM | POA: Diagnosis not present

## 2014-02-23 ENCOUNTER — Encounter (HOSPITAL_COMMUNITY): Payer: Self-pay | Admitting: *Deleted

## 2014-02-23 ENCOUNTER — Other Ambulatory Visit (HOSPITAL_COMMUNITY): Payer: Self-pay | Admitting: Orthopaedic Surgery

## 2014-02-23 ENCOUNTER — Encounter (HOSPITAL_COMMUNITY): Payer: Self-pay | Admitting: Pharmacy Technician

## 2014-02-23 NOTE — Progress Notes (Signed)
02/23/14 1712  OBSTRUCTIVE SLEEP APNEA  Have you ever been diagnosed with sleep apnea through a sleep study? No  Do you snore loudly (loud enough to be heard through closed doors)?  1  Do you often feel tired, fatigued, or sleepy during the daytime? 0  Has anyone observed you stop breathing during your sleep? 0  Do you have, or are you being treated for high blood pressure? 1  BMI more than 35 kg/m2? 0  Age over 70 years old? 1  Neck circumference greater than 40 cm/16 inches? 1  Gender: 1  Obstructive Sleep Apnea Score 5  Score 4 or greater  Results sent to PCP

## 2014-02-24 ENCOUNTER — Encounter (HOSPITAL_COMMUNITY): Payer: Medicare Other | Admitting: Anesthesiology

## 2014-02-24 ENCOUNTER — Ambulatory Visit (HOSPITAL_COMMUNITY)
Admission: RE | Admit: 2014-02-24 | Discharge: 2014-02-24 | Disposition: A | Discharge status: Home / Self Care | Payer: Medicare Other | Source: Ambulatory Visit | Attending: Orthopaedic Surgery | Admitting: Orthopaedic Surgery

## 2014-02-24 ENCOUNTER — Ambulatory Visit (HOSPITAL_COMMUNITY): Payer: Medicare Other

## 2014-02-24 ENCOUNTER — Encounter (HOSPITAL_COMMUNITY): Payer: Self-pay | Admitting: *Deleted

## 2014-02-24 ENCOUNTER — Encounter (HOSPITAL_COMMUNITY)
Admission: RE | Disposition: A | Discharge status: Home / Self Care | Payer: Self-pay | Source: Ambulatory Visit | Attending: Orthopaedic Surgery

## 2014-02-24 ENCOUNTER — Ambulatory Visit (HOSPITAL_COMMUNITY): Payer: Medicare Other | Admitting: Anesthesiology

## 2014-02-24 DIAGNOSIS — I10 Essential (primary) hypertension: Secondary | ICD-10-CM | POA: Diagnosis not present

## 2014-02-24 DIAGNOSIS — M23329 Other meniscus derangements, posterior horn of medial meniscus, unspecified knee: Secondary | ICD-10-CM | POA: Diagnosis not present

## 2014-02-24 DIAGNOSIS — Z79899 Other long term (current) drug therapy: Secondary | ICD-10-CM | POA: Insufficient documentation

## 2014-02-24 DIAGNOSIS — IMO0002 Reserved for concepts with insufficient information to code with codable children: Secondary | ICD-10-CM | POA: Diagnosis not present

## 2014-02-24 DIAGNOSIS — Z96659 Presence of unspecified artificial knee joint: Secondary | ICD-10-CM | POA: Insufficient documentation

## 2014-02-24 DIAGNOSIS — M2240 Chondromalacia patellae, unspecified knee: Secondary | ICD-10-CM | POA: Diagnosis not present

## 2014-02-24 DIAGNOSIS — Z7982 Long term (current) use of aspirin: Secondary | ICD-10-CM | POA: Diagnosis not present

## 2014-02-24 DIAGNOSIS — Z87891 Personal history of nicotine dependence: Secondary | ICD-10-CM | POA: Diagnosis not present

## 2014-02-24 DIAGNOSIS — D649 Anemia, unspecified: Secondary | ICD-10-CM | POA: Insufficient documentation

## 2014-02-24 DIAGNOSIS — Z91041 Radiographic dye allergy status: Secondary | ICD-10-CM | POA: Diagnosis not present

## 2014-02-24 DIAGNOSIS — S83242A Other tear of medial meniscus, current injury, left knee, initial encounter: Secondary | ICD-10-CM

## 2014-02-24 HISTORY — DX: Anemia, unspecified: D64.9

## 2014-02-24 HISTORY — DX: Essential (primary) hypertension: I10

## 2014-02-24 HISTORY — PX: KNEE ARTHROSCOPY: SHX127

## 2014-02-24 LAB — BASIC METABOLIC PANEL
BUN: 22 mg/dL (ref 6–23)
CO2: 17 mEq/L — ABNORMAL LOW (ref 19–32)
Calcium: 9.7 mg/dL (ref 8.4–10.5)
Chloride: 102 mEq/L (ref 96–112)
Creatinine, Ser: 0.98 mg/dL (ref 0.50–1.35)
GFR calc Af Amer: >90 mL/min (ref >90)
GFR calc non Af Amer: 81 mL/min — ABNORMAL LOW (ref >90)
Glucose, Bld: 103 mg/dL — ABNORMAL HIGH (ref 70–99)
Potassium: 6.1 mEq/L — ABNORMAL HIGH (ref 3.7–5.3)
Sodium: 137 mEq/L (ref 137–147)

## 2014-02-24 LAB — CBC
HCT: 42.4 % (ref 39.0–52.0)
Hemoglobin: 14.2 g/dL (ref 13.0–17.0)
MCH: 29.2 pg (ref 26.0–34.0)
MCHC: 33.5 g/dL (ref 30.0–36.0)
MCV: 87.1 fL (ref 78.0–100.0)
Platelets: 279 10*3/uL (ref 150–400)
RBC: 4.87 MIL/uL (ref 4.22–5.81)
RDW: 12.9 % (ref 11.5–15.5)
WBC: 6.7 10*3/uL (ref 4.0–10.5)

## 2014-02-24 IMAGING — CR DG CHEST 2V
2 series · 2 of 2 positions shown · non-contrast
Comparison: Prior radiograph from [DATE]

CLINICAL DATA: Perioperative evaluation

EXAM:
CHEST  2 VIEW

[w chest pa]
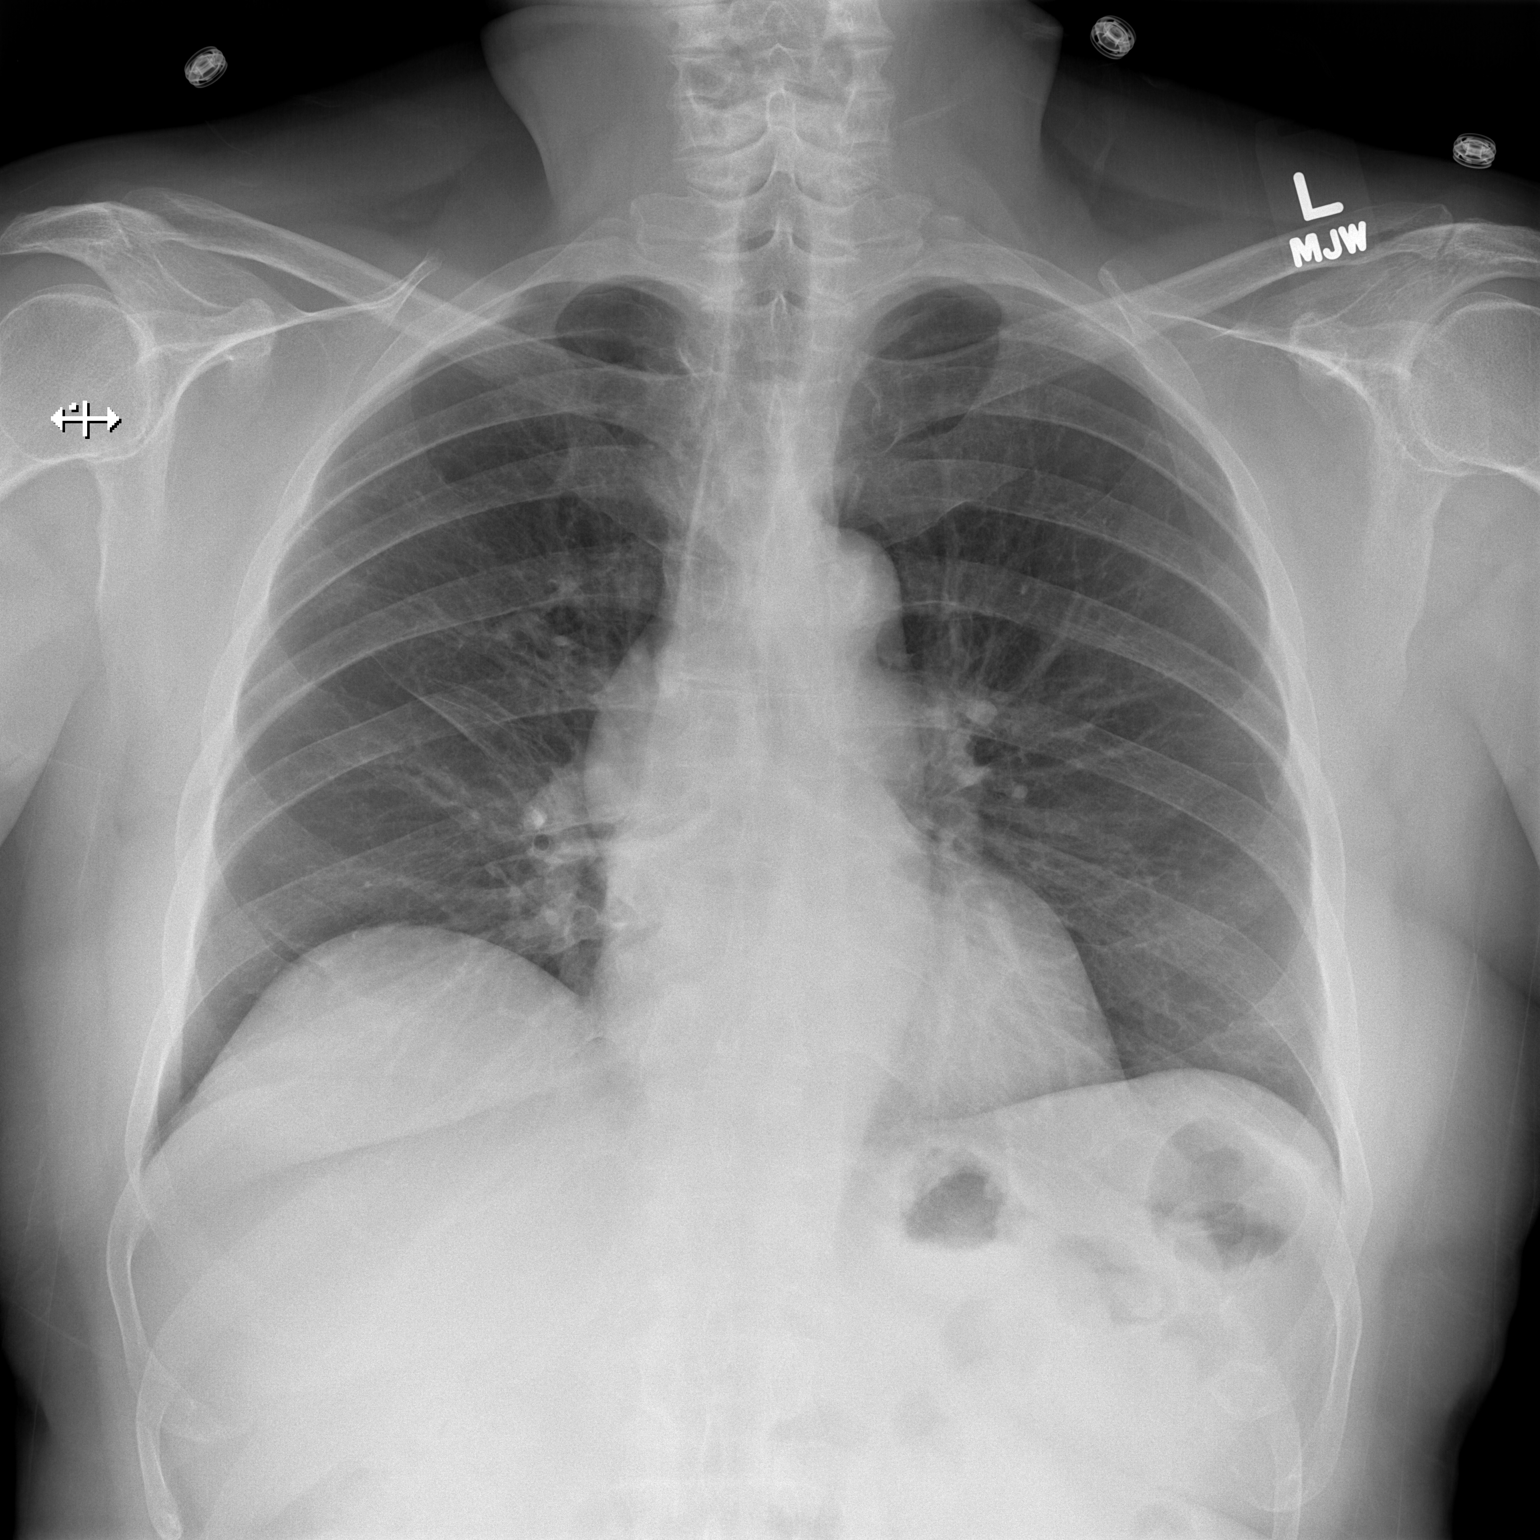

[w chest lat]
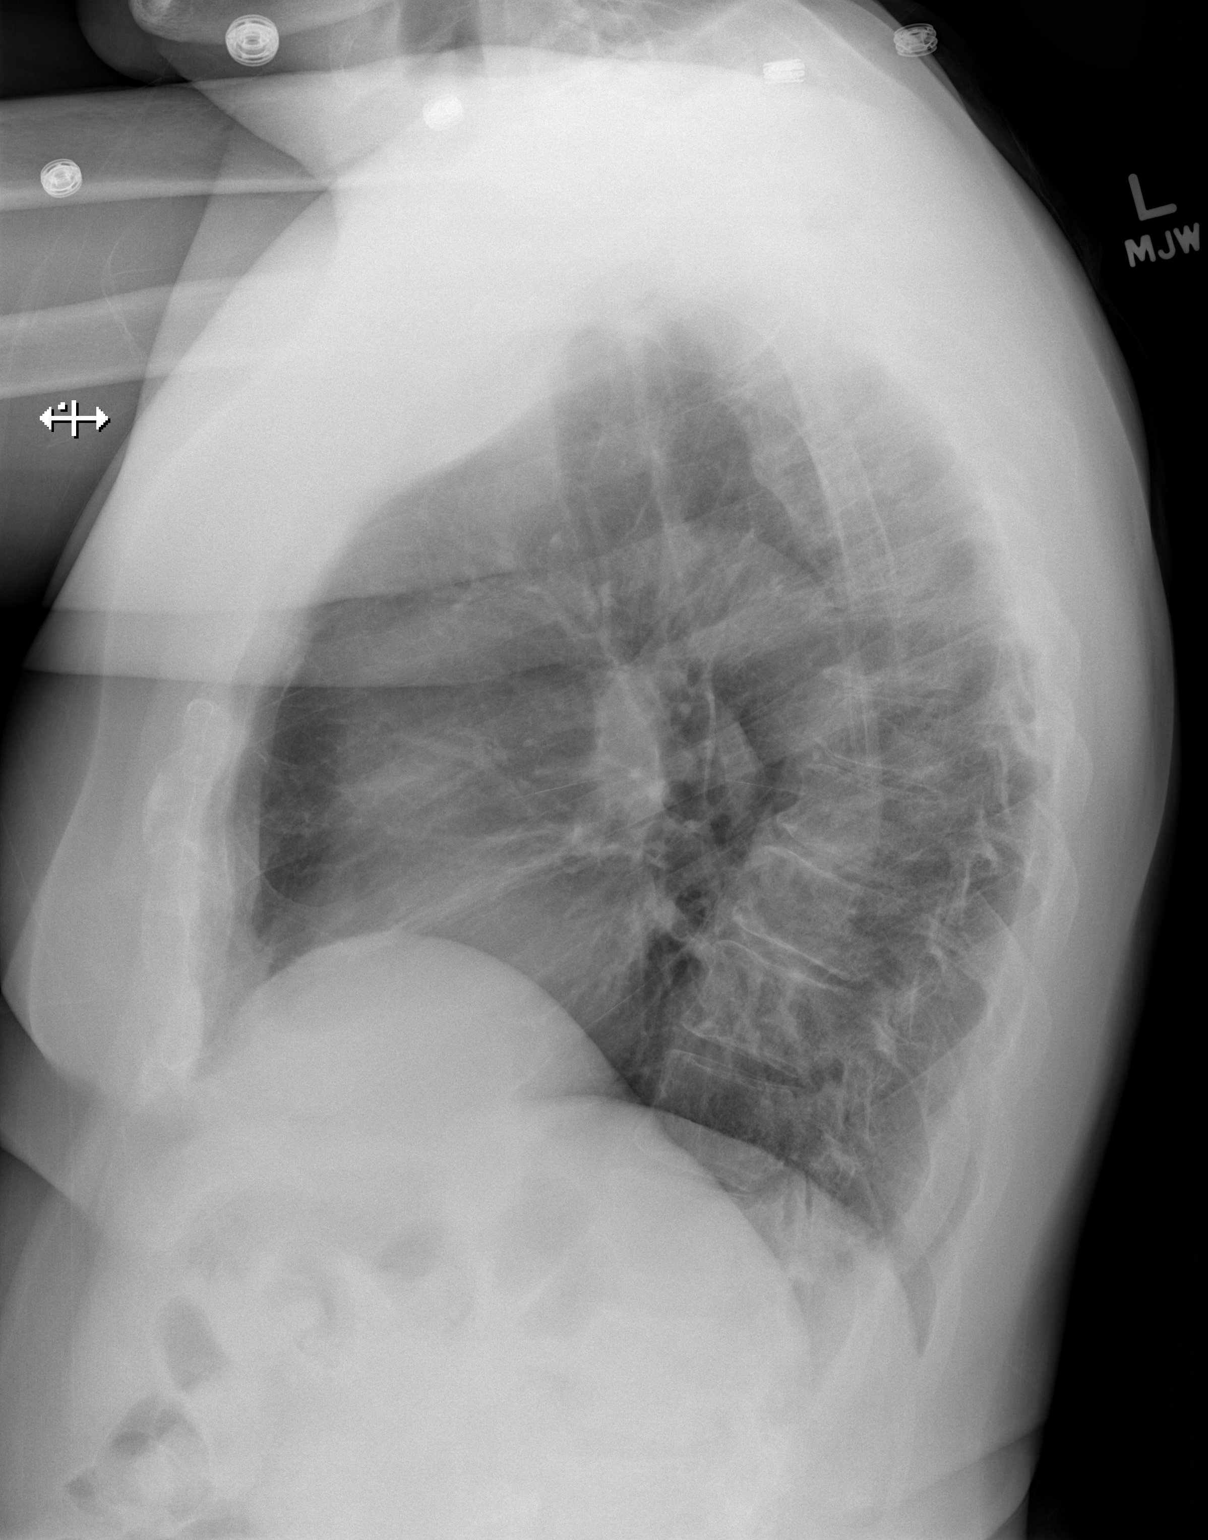

[2 of 2 positions shown; findings below may reference images not displayed]

FINDINGS: The cardiac and mediastinal silhouettes are stable in size and
contour, and remain within normal limits.

The lungs are normally inflated. No airspace consolidation, pleural
effusion, or pulmonary edema is identified. There is no
pneumothorax.

No acute osseous abnormality identified. Multilevel degenerative
changes noted within the mid thoracic spine.
IMPRESSION: No active cardiopulmonary disease.

## 2014-02-24 SURGERY — ARTHROSCOPY, KNEE
Anesthesia: General | Site: Knee | Laterality: Left

## 2014-02-24 MED ORDER — PROPOFOL 10 MG/ML IV BOLUS
INTRAVENOUS | Status: AC
  Filled 2014-02-24: qty 20

## 2014-02-24 MED ORDER — MIDAZOLAM HCL 2 MG/2ML IJ SOLN
INTRAMUSCULAR | Status: AC
  Filled 2014-02-24: qty 2

## 2014-02-24 MED ORDER — ARTIFICIAL TEARS OP OINT
TOPICAL_OINTMENT | OPHTHALMIC | Status: DC | PRN
  Administered 2014-02-24: 1 via OPHTHALMIC

## 2014-02-24 MED ORDER — FENTANYL CITRATE 0.05 MG/ML IJ SOLN
25.0000 ug | INTRAMUSCULAR | Status: DC | PRN
  Administered 2014-02-24 (×4): 25 ug via INTRAVENOUS

## 2014-02-24 MED ORDER — FENTANYL CITRATE 0.05 MG/ML IJ SOLN
INTRAMUSCULAR | Status: AC
  Filled 2014-02-24: qty 5

## 2014-02-24 MED ORDER — ROCURONIUM BROMIDE 50 MG/5ML IV SOLN
INTRAVENOUS | Status: AC
  Filled 2014-02-24: qty 1

## 2014-02-24 MED ORDER — 0.9 % SODIUM CHLORIDE (POUR BTL) OPTIME
TOPICAL | Status: DC | PRN
  Administered 2014-02-24: 1000 mL

## 2014-02-24 MED ORDER — LIDOCAINE HCL (CARDIAC) 20 MG/ML IV SOLN
INTRAVENOUS | Status: DC | PRN
  Administered 2014-02-24: 30 mg via INTRAVENOUS

## 2014-02-24 MED ORDER — CEFAZOLIN SODIUM-DEXTROSE 2-3 GM-% IV SOLR
INTRAVENOUS | Status: AC
  Administered 2014-02-24: 2 g via INTRAVENOUS
  Filled 2014-02-24: qty 50

## 2014-02-24 MED ORDER — PROPOFOL 10 MG/ML IV BOLUS
INTRAVENOUS | Status: DC | PRN
  Administered 2014-02-24: 40 mg via INTRAVENOUS
  Administered 2014-02-24: 150 mg via INTRAVENOUS

## 2014-02-24 MED ORDER — FENTANYL CITRATE 0.05 MG/ML IJ SOLN
INTRAMUSCULAR | Status: AC
  Filled 2014-02-24: qty 2

## 2014-02-24 MED ORDER — SODIUM CHLORIDE 0.9 % IR SOLN
Status: DC | PRN
  Administered 2014-02-24 (×2): 3000 mL

## 2014-02-24 MED ORDER — OXYCODONE HCL 5 MG/5ML PO SOLN: 5.0000 mg | Freq: Once | ORAL | Status: DC | PRN

## 2014-02-24 MED ORDER — OXYCODONE HCL 5 MG PO TABS: 5.0000 mg | ORAL_TABLET | Freq: Once | ORAL | Status: DC | PRN

## 2014-02-24 MED ORDER — LACTATED RINGERS IV SOLN
INTRAVENOUS | Status: DC
  Administered 2014-02-24: 50 mL/h via INTRAVENOUS

## 2014-02-24 MED ORDER — ONDANSETRON HCL 4 MG/2ML IJ SOLN
INTRAMUSCULAR | Status: DC | PRN
  Administered 2014-02-24: 4 mg via INTRAVENOUS

## 2014-02-24 MED ORDER — MEPERIDINE HCL 25 MG/ML IJ SOLN: 6.2500 mg | INTRAMUSCULAR | Status: DC | PRN

## 2014-02-24 MED ORDER — MORPHINE SULFATE 4 MG/ML IJ SOLN
INTRAMUSCULAR | Status: DC | PRN
  Administered 2014-02-24: 4 mg

## 2014-02-24 MED ORDER — FENTANYL CITRATE 0.05 MG/ML IJ SOLN
INTRAMUSCULAR | Status: DC | PRN
  Administered 2014-02-24 (×2): 50 ug via INTRAVENOUS

## 2014-02-24 MED ORDER — BUPIVACAINE HCL (PF) 0.25 % IJ SOLN
INTRAMUSCULAR | Status: DC | PRN
  Administered 2014-02-24: 30 mL

## 2014-02-24 MED ORDER — MORPHINE SULFATE 4 MG/ML IJ SOLN
INTRAMUSCULAR | Status: AC
  Filled 2014-02-24: qty 1

## 2014-02-24 MED ORDER — MIDAZOLAM HCL 5 MG/5ML IJ SOLN
INTRAMUSCULAR | Status: DC | PRN
  Administered 2014-02-24: 2 mg via INTRAVENOUS

## 2014-02-24 MED ORDER — MIDAZOLAM HCL 2 MG/2ML IJ SOLN: 0.5000 mg | Freq: Once | INTRAMUSCULAR | Status: DC | PRN

## 2014-02-24 MED ORDER — OXYCODONE-ACETAMINOPHEN 5-325 MG PO TABS: 1.0000 | ORAL_TABLET | ORAL | Status: DC | PRN

## 2014-02-24 MED ORDER — NEOSTIGMINE METHYLSULFATE 10 MG/10ML IV SOLN
INTRAVENOUS | Status: AC
  Filled 2014-02-24: qty 1

## 2014-02-24 MED ORDER — LIDOCAINE HCL (CARDIAC) 20 MG/ML IV SOLN
INTRAVENOUS | Status: AC
  Filled 2014-02-24: qty 5

## 2014-02-24 MED ORDER — CEFAZOLIN SODIUM-DEXTROSE 2-3 GM-% IV SOLR: 2.0000 g | INTRAVENOUS | Status: DC

## 2014-02-24 MED ORDER — LACTATED RINGERS IV SOLN
INTRAVENOUS | Status: DC | PRN
  Administered 2014-02-24: 14:00:00 via INTRAVENOUS

## 2014-02-24 MED ORDER — BUPIVACAINE-EPINEPHRINE (PF) 0.25% -1:200000 IJ SOLN
INTRAMUSCULAR | Status: AC
  Filled 2014-02-24: qty 30

## 2014-02-24 MED ORDER — PROMETHAZINE HCL 25 MG/ML IJ SOLN: 6.2500 mg | INTRAMUSCULAR | Status: DC | PRN

## 2014-02-24 MED ORDER — BUPIVACAINE HCL (PF) 0.25 % IJ SOLN
INTRAMUSCULAR | Status: AC
  Filled 2014-02-24: qty 30

## 2014-02-24 MED ORDER — BUPIVACAINE HCL (PF) 0.5 % IJ SOLN
INTRAMUSCULAR | Status: AC
  Filled 2014-02-24: qty 10

## 2014-02-24 SURGICAL SUPPLY — 32 items
BANDAGE ELASTIC 6 VELCRO ST LF (GAUZE/BANDAGES/DRESSINGS) ×2 IMPLANT
BLADE 10 SAFETY STRL DISP (BLADE) ×2 IMPLANT
BLADE CUTTER GATOR 3.5 (BLADE) ×2 IMPLANT
COVER SURGICAL LIGHT HANDLE (MISCELLANEOUS) ×2 IMPLANT
DRAPE ARTHROSCOPY W/POUCH 114 (DRAPES) ×2 IMPLANT
DRAPE U-SHAPE 47X51 STRL (DRAPES) ×2 IMPLANT
DRSG PAD ABDOMINAL 8X10 ST (GAUZE/BANDAGES/DRESSINGS) IMPLANT
DURAPREP 26ML APPLICATOR (WOUND CARE) ×2 IMPLANT
GAUZE XEROFORM 1X8 LF (GAUZE/BANDAGES/DRESSINGS) ×2 IMPLANT
GLOVE BIO SURGEON STRL SZ8 (GLOVE) ×2 IMPLANT
GLOVE BIOGEL PI IND STRL 8 (GLOVE) ×1 IMPLANT
GLOVE BIOGEL PI INDICATOR 8 (GLOVE) ×1
GLOVE ORTHO TXT STRL SZ7.5 (GLOVE) ×2 IMPLANT
GOWN STRL REUS W/ TWL LRG LVL3 (GOWN DISPOSABLE) ×2 IMPLANT
GOWN STRL REUS W/ TWL XL LVL3 (GOWN DISPOSABLE) ×4 IMPLANT
GOWN STRL REUS W/TWL LRG LVL3 (GOWN DISPOSABLE) ×2
GOWN STRL REUS W/TWL XL LVL3 (GOWN DISPOSABLE) ×4
KIT ROOM TURNOVER OR (KITS) ×2 IMPLANT
MANIFOLD NEPTUNE II (INSTRUMENTS) ×2 IMPLANT
NS IRRIG 1000ML POUR BTL (IV SOLUTION) ×2 IMPLANT
PACK ARTHROSCOPY DSU (CUSTOM PROCEDURE TRAY) ×2 IMPLANT
PAD ARMBOARD 7.5X6 YLW CONV (MISCELLANEOUS) ×2 IMPLANT
PADDING CAST ABS 6INX4YD NS (CAST SUPPLIES) ×1
PADDING CAST ABS COTTON 6X4 NS (CAST SUPPLIES) ×1 IMPLANT
PADDING CAST COTTON 6X4 STRL (CAST SUPPLIES) ×2 IMPLANT
SET ARTHROSCOPY TUBING (MISCELLANEOUS) ×1
SET ARTHROSCOPY TUBING LN (MISCELLANEOUS) ×1 IMPLANT
SPONGE GAUZE 4X4 12PLY (GAUZE/BANDAGES/DRESSINGS) ×2 IMPLANT
SPONGE LAP 4X18 X RAY DECT (DISPOSABLE) ×2 IMPLANT
SUT ETHILON 3 0 PS 1 (SUTURE) ×2 IMPLANT
TOWEL OR 17X26 10 PK STRL BLUE (TOWEL DISPOSABLE) ×2 IMPLANT
WRAP KNEE MAXI GEL POST OP (GAUZE/BANDAGES/DRESSINGS) ×2 IMPLANT

## 2014-02-24 NOTE — Discharge Instructions (Signed)
Increase your activities as comfort allows. You may put all of your weight on your left leg as tolerated. Elevation and ice as needed over the next several days. Leave your dressing on until tomorrow afternoon 02/25/14. Tomorrow afternoon, you can shower and get your incisions wet daily. Band-aids over your sutures daily.

## 2014-02-24 NOTE — Brief Op Note (Signed)
02/24/2014  2:49 PM  PATIENT:  [name]  68 y.o. male  PRE-OPERATIVE DIAGNOSIS:  Left knee medial meniscal tear  POST-OPERATIVE DIAGNOSIS:  Left knee medial meniscal tear  PROCEDURE:  Procedure(s): LEFT KNEE ARTHROSCOPY WITH PARTIAL MEDIAL MENISCECTOMY (Left)  SURGEON:  Surgeon(s) and Role:    * Mcarthur Rossetti, MD - Primary  PHYSICIAN ASSISTANT: Benita Stabile, PA-C  ANESTHESIA:   local and general  EBL:  Total I/O In: 500 [I.V.:500] Out: -   BLOOD ADMINISTERED:none  DRAINS: none   LOCAL MEDICATIONS USED:  MARCAINE     SPECIMEN:  No Specimen  DISPOSITION OF SPECIMEN:  N/A  COUNTS:  YES  TOURNIQUET:    DICTATION: .Other Dictation: Dictation Number [id number]  PLAN OF CARE: Discharge to home after PACU  PATIENT DISPOSITION:  PACU - hemodynamically stable.   Delay start of Pharmacological VTE agent (>24hrs) due to surgical blood loss or risk of bleeding: not applicable

## 2014-02-24 NOTE — H&P (Signed)
Ryan Craig is an 68 y.o. male.   Chief Complaint:   Left knee pain with locking/catching HPI:   67 yo male well-known to me with a symptomatic left knee medial meniscal tear confirmed with a MRI.  He has failed conservative treatment and now wishes to proceed with surgery.  Past Medical History  Diagnosis Date  . Hypertension   . Anemia     Past Surgical History  Procedure Laterality Date  . Joint replacement Right     knee  . Arthroscopic knee surgery Right   . Colonoscopy    . Inguinal hernia repair Bilateral     History reviewed. No pertinent family history. Social History:  reports that he quit smoking about 6 years ago. He has never used smokeless tobacco. He reports that he does not drink alcohol or use illicit drugs.  Allergies:  Allergies  Allergen Reactions  . Contrast Media [Iodinated Diagnostic Agents] Anaphylaxis    Medications Prior to Admission  Medication Sig Dispense Refill  . amLODipine (NORVASC) 10 MG tablet Take 10 mg by mouth daily.      Marland Kitchen aspirin EC 81 MG tablet Take 81 mg by mouth daily.      . Cholecalciferol (VITAMIN D-3 PO) Take 1 tablet by mouth daily.      Marland Kitchen HYDROcodone-acetaminophen (NORCO/VICODIN) 5-325 MG per tablet Take 1 tablet by mouth every 6 (six) hours as needed for moderate pain.      Marland Kitchen Pyridoxine HCl (VITAMIN B-6 PO) Take 1 tablet by mouth daily.      . quinapril (ACCUPRIL) 20 MG tablet Take 20 mg by mouth daily.        No results found for this or any previous visit (from the past 48 hour(s)). No results found.  Review of Systems  Musculoskeletal: Positive for joint pain.  All other systems reviewed and are negative.   There were no vitals taken for this visit. Physical Exam  Constitutional: He is oriented to person, place, and time. He appears well-developed and well-nourished.  HENT:  Head: Normocephalic and atraumatic.  Eyes: EOM are normal. Pupils are equal, round, and reactive to light.  Neck: Normal range of motion.  Neck supple.  Cardiovascular: Normal rate and regular rhythm.   Respiratory: Effort normal and breath sounds normal.  GI: Soft. Bowel sounds are normal.  Musculoskeletal:       Left knee: He exhibits decreased range of motion and swelling. Tenderness found.  Neurological: He is alert and oriented to person, place, and time.  Skin: Skin is warm and dry.  Psychiatric: He has a normal mood and affect.    Positive McMurry sign of the left knee medial side   Assessment/Plan Symptomatic medial meniscal tear left knee 1)  Given his continued knee pain with swelling, locking and catching as well as his MRI findings showing a large oblique medical meniscal tear, he wishes to proceed with arthroscopic surgery on his left knee today as an outpatient.  He understands fully the risks and benefits involved.  Ryan Craig 02/24/2014, 11:14 AM

## 2014-02-24 NOTE — Transfer of Care (Signed)
Immediate Anesthesia Transfer of Care Note  Patient: Ryan Craig  Procedure(s) Performed: Procedure(s): LEFT KNEE ARTHROSCOPY WITH PARTIAL MEDIAL MENISCECTOMY (Left)  Patient Location: PACU  Anesthesia Type:General  Level of Consciousness: awake and alert   Airway & Oxygen Therapy: Patient Spontanous Breathing and Patient connected to nasal cannula oxygen  Post-op Assessment: Report given to PACU RN and Post -op Vital signs reviewed and stable  Post vital signs: Reviewed and stable  Complications: No apparent anesthesia complications

## 2014-02-24 NOTE — Anesthesia Postprocedure Evaluation (Signed)
  Anesthesia Post-op Note  Patient: [name]  Procedure(s) Performed: Procedure(s): LEFT KNEE ARTHROSCOPY WITH PARTIAL MEDIAL MENISCECTOMY (Left)  Patient Location: PACU  Anesthesia Type:General  Level of Consciousness: awake, alert , oriented and patient cooperative  Airway and Oxygen Therapy: Patient Spontanous Breathing  Post-op Pain: none  Post-op Assessment: Post-op Vital signs reviewed, Patient's Cardiovascular Status Stable, Respiratory Function Stable, Patent Airway, No signs of Nausea or vomiting and Pain level controlled  Post-op Vital Signs: Reviewed and stable  Last Vitals:  Filed Vitals:   02/24/14 1606  BP: 150/82  Pulse: 76  Temp:   Resp:     Complications: No apparent anesthesia complications

## 2014-02-24 NOTE — Progress Notes (Signed)
Orthopedic Tech Progress Note Patient Details:  Ryan Craig 02-May-1946 [id number]  Ortho Devices Type of Ortho Device: Crutches Ortho Device/Splint Interventions: Application Viewed order from rn order list  Ryan Craig 02/24/2014, 3:37 PM

## 2014-02-24 NOTE — Anesthesia Preprocedure Evaluation (Addendum)
Anesthesia Evaluation  Patient identified by MRN, date of birth, ID band Patient awake    Reviewed: Allergy & Precautions, H&P , NPO status , Patient's Chart, lab work & pertinent test results  History of Anesthesia Complications Negative for: history of anesthetic complications  Airway Mallampati: I TM Distance: >3 FB Neck ROM: Full    Dental  (+) Edentulous Upper, Edentulous Lower   Pulmonary former smoker (quit '09),  breath sounds clear to auscultation  Pulmonary exam normal       Cardiovascular hypertension, Pt. on medications - anginaRhythm:Regular Rate:Normal     Neuro/Psych negative neurological ROS     GI/Hepatic negative GI ROS, Neg liver ROS,   Endo/Other  negative endocrine ROS  Renal/GU negative Renal ROS     Musculoskeletal   Abdominal (+) + obese,   Peds  Hematology negative hematology ROS (+)   Anesthesia Other Findings   Reproductive/Obstetrics                         Anesthesia Physical Anesthesia Plan  ASA: II  Anesthesia Plan: General   Post-op Pain Management:    Induction: Intravenous  Airway Management Planned: LMA  Additional Equipment:   Intra-op Plan:   Post-operative Plan:   Informed Consent: I have reviewed the patients History and Physical, chart, labs and discussed the procedure including the risks, benefits and alternatives for the proposed anesthesia with the patient or authorized representative who has indicated his/her understanding and acceptance.     Plan Discussed with: CRNA and Surgeon  Anesthesia Plan Comments: (Plan routine monitors, GA- LMA OK)       Anesthesia Quick Evaluation

## 2014-02-25 NOTE — Op Note (Signed)
NAME:  Ryan Craig, Ryan Craig NO.:  [id number]  MEDICAL RECORD NO.:  [id number]  LOCATION:  [facility]                         FACILITY:  [facility]  PHYSICIAN:  Lind Guest. Ninfa Linden, M.D.DATE OF BIRTH:  04/22/1946  DATE OF PROCEDURE:  02/24/2014 DATE OF DISCHARGE:  02/24/2014                              OPERATIVE REPORT   PREOPERATIVE DIAGNOSIS:  Left knee complex medial meniscal tear.  POSTOPERATIVE DIAGNOSIS:  Left knee complex medial meniscal tear.  PROCEDURE:  Left knee arthroscopy with partial medial meniscectomy.  SURGEON:  Lind Guest. Ninfa Linden, MD  ASSISTANT:  Erskine Emery, PA  ANESTHESIA: 1. General. 2. Local with mixture of 0.25% plain Sensorcaine and 4 mg of morphine.  BLOOD LOSS:  Minimal.  COMPLICATIONS:  None.  INDICATIONS:  Mr. Band is a 68 year old patient, last seen for many years now.  He has had a lot of locking and catching in his left knee and medial joint line tenderness.  Had a very conservative treatment. An MRI was obtained.  It did show a complex posterior horn to midbody medial meniscal tear with significant meniscal fragment.  This is a degenerative type of tear, but now it has become symptomatic.  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  At this point, he does wish to proceed with arthroscopic intervention given his locking, catching, daily pain, and recurrent effusions.  The risks and benefits of the surgery were explained to him in detail and he does wish to proceed.  PROCEDURE DESCRIPTION:  After informed consent was obtained, appropriate left knee was marked.  He was brought to the operating room, placed supine on the operating table.  General anesthesia was then obtained. We then prepped and draped the left thigh, knee and ankle with DuraPrep and sterile drapes including sterile stockinette.  With the bed raised a lateral leg post utilized. The left knee was flexed off the table.  Time- out was called  and he was identified as the correct patient, correct left knee.  We then made an anterolateral arthroscopy portal and inserted the cannula in the knee.  We drained fluid from the knee and then placed the camera. We did went to the medial compartment and then made an anteromedial incision.  Here we could see there was a complex medial meniscal tear, and there were areas of significant cartilage thinning on the medial femoral condyle and the medial tibial plateau. Using up cutting biter as well as Arthritic shaver we were able to debride the meniscus back performing a partial medial meniscectomy to a stable margin.  I then probed cartilage throughout the medial compartment of the knee and found to have just some areas of loose cartilage with no exposed bone.  I smoothed these out to a stable margin performing chondroplasty of the medial compartment.  The ACL had some degenerative tearing but was otherwise intact, the PCL was intact.  I went to the lateral compartment and found it to be for the most part intact.  Finally, the patellofemoral joint was assessed and it looked pretty good with minimal cartilage changes.  I then allowed fluid to lavage to the knee and drained fluid from the knee.  We  closed the portal sites with interrupted nylon suture and inserted a mixture of morphine and Marcaine into the knee joint of the portal sites.  Well- padded sterile dressing was applied.  He was awakened, extubated, and taken to recovery room in stable condition.  All final counts were correct.  There were no complications noted.  Postoperatively, we will allow him to increase his activity as tolerated and weightbearing as tolerated.  I will see him back in the office in a week.     Lind Guest. Ninfa Linden, M.D.     CYB/MEDQ  D:  02/24/2014  T:  02/25/2014  Job:  [id number]

## 2014-02-27 ENCOUNTER — Encounter (HOSPITAL_COMMUNITY): Payer: Self-pay | Admitting: Orthopaedic Surgery

## 2014-04-14 DIAGNOSIS — M1710 Unilateral primary osteoarthritis, unspecified knee: Secondary | ICD-10-CM | POA: Diagnosis not present

## 2014-04-28 DIAGNOSIS — M1710 Unilateral primary osteoarthritis, unspecified knee: Secondary | ICD-10-CM | POA: Diagnosis not present

## 2014-05-25 DIAGNOSIS — Z125 Encounter for screening for malignant neoplasm of prostate: Secondary | ICD-10-CM | POA: Diagnosis not present

## 2014-05-25 DIAGNOSIS — Z0000 Encounter for general adult medical examination without abnormal findings: Secondary | ICD-10-CM | POA: Diagnosis not present

## 2014-05-25 DIAGNOSIS — R252 Cramp and spasm: Secondary | ICD-10-CM | POA: Diagnosis not present

## 2014-05-25 DIAGNOSIS — Z79899 Other long term (current) drug therapy: Secondary | ICD-10-CM | POA: Diagnosis not present

## 2014-05-25 DIAGNOSIS — I10 Essential (primary) hypertension: Secondary | ICD-10-CM | POA: Diagnosis not present

## 2014-06-02 DIAGNOSIS — M79609 Pain in unspecified limb: Secondary | ICD-10-CM | POA: Diagnosis not present

## 2014-07-07 DIAGNOSIS — M1710 Unilateral primary osteoarthritis, unspecified knee: Secondary | ICD-10-CM | POA: Diagnosis not present

## 2014-09-07 DIAGNOSIS — M1712 Unilateral primary osteoarthritis, left knee: Secondary | ICD-10-CM | POA: Diagnosis not present

## 2014-09-25 DIAGNOSIS — Z79899 Other long term (current) drug therapy: Secondary | ICD-10-CM | POA: Diagnosis not present

## 2014-09-25 DIAGNOSIS — I10 Essential (primary) hypertension: Secondary | ICD-10-CM | POA: Diagnosis not present

## 2014-09-25 DIAGNOSIS — Z0000 Encounter for general adult medical examination without abnormal findings: Secondary | ICD-10-CM | POA: Diagnosis not present

## 2014-09-25 DIAGNOSIS — E559 Vitamin D deficiency, unspecified: Secondary | ICD-10-CM | POA: Diagnosis not present

## 2014-11-09 ENCOUNTER — Other Ambulatory Visit (HOSPITAL_COMMUNITY): Payer: Self-pay | Admitting: Orthopaedic Surgery

## 2014-11-09 DIAGNOSIS — M179 Osteoarthritis of knee, unspecified: Secondary | ICD-10-CM | POA: Diagnosis not present

## 2014-12-10 NOTE — Pre-Procedure Instructions (Signed)
Ryan Craig  12/10/2014   Your procedure is scheduled on:  Tues, Mar 1 @ 1:00 PM  Report to [facility]  at 11:00 AM.  Call this number if you have problems the morning of surgery: [phone]   Remember:   Do not eat food or drink liquids after midnight.   Take these medicines the morning of surgery with A SIP OF WATER: Amlodipine(Norvasc) and Pain Pill(if needed)              Stop taking your Aspirin a week before surgery. No Goody's,BC's,Aleve,Ibuprofen,Fish Oil,or any Herbal Medications   Do not wear jewelry  Do not wear lotions, powders, or colognes. You may wear deodorant.  Men may shave face and neck.  Do not bring valuables to the hospital.  [facility] is not responsible                  for any belongings or valuables.               Contacts, dentures or bridgework may not be worn into surgery.  Leave suitcase in the car. After surgery it may be brought to your room.  For patients admitted to the hospital, discharge time is determined by your                treatment team.               Special Instructions:  [facility] - Preparing for Surgery  Before surgery, you can play an important role.  Because skin is not sterile, your skin needs to be as free of germs as possible.  You can reduce the number of germs on you skin by washing with CHG (chlorahexidine gluconate) soap before surgery.  CHG is an antiseptic cleaner which kills germs and bonds with the skin to continue killing germs even after washing.  Please DO NOT use if you have an allergy to CHG or antibacterial soaps.  If your skin becomes reddened/irritated stop using the CHG and inform your nurse when you arrive at Short Stay.  Do not shave (including legs and underarms) for at least 48 hours prior to the first CHG shower.  You may shave your face.  Please follow these instructions carefully:   1.  Shower with CHG Soap the night before surgery and the                                morning of  Surgery.  2.  If you choose to wash your hair, wash your hair first as usual with your       normal shampoo.  3.  After you shampoo, rinse your hair and body thoroughly to remove the                      Shampoo.  4.  Use CHG as you would any other liquid soap.  You can apply chg directly       to the skin and wash gently with scrungie or a clean washcloth.  5.  Apply the CHG Soap to your body ONLY FROM THE NECK DOWN.        Do not use on open wounds or open sores.  Avoid contact with your eyes,       ears, mouth and genitals (private parts).  Wash genitals (private parts)       with your normal soap.  6.  Wash thoroughly, paying special attention to the area where your surgery        will be performed.  7.  Thoroughly rinse your body with warm water from the neck down.  8.  DO NOT shower/wash with your normal soap after using and rinsing off       the CHG Soap.  9.  Pat yourself dry with a clean towel.            10.  Wear clean pajamas.            11.  Place clean sheets on your bed the night of your first shower and do not        sleep with pets.  Day of Surgery  Do not apply any lotions/deoderants the morning of surgery.  Please wear clean clothes to the hospital/surgery center.     Please read over the following fact sheets that you were given: Pain Booklet, Coughing and Deep Breathing, MRSA Information and Surgical Site Infection Prevention

## 2014-12-11 ENCOUNTER — Encounter (HOSPITAL_COMMUNITY): Payer: Self-pay

## 2014-12-11 ENCOUNTER — Encounter (HOSPITAL_COMMUNITY)
Admission: RE | Admit: 2014-12-11 | Discharge: 2014-12-11 | Disposition: A | Discharge status: Home / Self Care | Payer: Medicare Other | Source: Ambulatory Visit | Attending: Orthopaedic Surgery | Admitting: Orthopaedic Surgery

## 2014-12-11 DIAGNOSIS — Z01812 Encounter for preprocedural laboratory examination: Secondary | ICD-10-CM | POA: Diagnosis not present

## 2014-12-11 DIAGNOSIS — M1712 Unilateral primary osteoarthritis, left knee: Secondary | ICD-10-CM | POA: Diagnosis not present

## 2014-12-11 HISTORY — DX: Effusion, unspecified joint: M25.40

## 2014-12-11 HISTORY — DX: Hyperlipidemia, unspecified: E78.5

## 2014-12-11 HISTORY — DX: Pain in unspecified joint: M25.50

## 2014-12-11 HISTORY — DX: Other chronic pain: G89.29

## 2014-12-11 HISTORY — DX: Unspecified osteoarthritis, unspecified site: M19.90

## 2014-12-11 HISTORY — DX: Dorsalgia, unspecified: M54.9

## 2014-12-11 LAB — BASIC METABOLIC PANEL
Anion gap: 8 (ref 5–15)
BUN: 12 mg/dL (ref 6–23)
CO2: 23 mmol/L (ref 19–32)
Calcium: 9.7 mg/dL (ref 8.4–10.5)
Chloride: 109 mmol/L (ref 96–112)
Creatinine, Ser: 0.96 mg/dL (ref 0.50–1.35)
GFR calc Af Amer: >90 mL/min (ref >90)
GFR calc non Af Amer: 81 mL/min — ABNORMAL LOW (ref >90)
Glucose, Bld: 93 mg/dL (ref 70–99)
Potassium: 4.7 mmol/L (ref 3.5–5.1)
Sodium: 140 mmol/L (ref 135–145)

## 2014-12-11 LAB — CBC
HCT: 39.1 % (ref 39.0–52.0)
Hemoglobin: 13 g/dL (ref 13.0–17.0)
MCH: 29 pg (ref 26.0–34.0)
MCHC: 33.2 g/dL (ref 30.0–36.0)
MCV: 87.1 fL (ref 78.0–100.0)
Platelets: 271 10*3/uL (ref 150–400)
RBC: 4.49 MIL/uL (ref 4.22–5.81)
RDW: 12.6 % (ref 11.5–15.5)
WBC: 5.6 10*3/uL (ref 4.0–10.5)

## 2014-12-11 LAB — URINALYSIS, ROUTINE W REFLEX MICROSCOPIC
Bilirubin Urine: NEGATIVE
Glucose, UA: NEGATIVE mg/dL
Hgb urine dipstick: NEGATIVE
Ketones, ur: NEGATIVE mg/dL
Leukocytes, UA: NEGATIVE
Nitrite: NEGATIVE
Protein, ur: NEGATIVE mg/dL
Specific Gravity, Urine: 1.024 (ref 1.005–1.030)
Urobilinogen, UA: 0.2 mg/dL (ref 0.0–1.0)
pH: 5.5 (ref 5.0–8.0)

## 2014-12-11 LAB — SURGICAL PCR SCREEN
MRSA, PCR: NEGATIVE
Staphylococcus aureus: NEGATIVE

## 2014-12-11 LAB — PROTIME-INR
INR: 0.94 (ref 0.00–1.49)
Prothrombin Time: 12.6 seconds (ref 11.6–15.2)

## 2014-12-11 LAB — APTT: aPTT: 29 seconds (ref 24–37)

## 2014-12-11 NOTE — Progress Notes (Addendum)
Medical Md is Dr.Robyn Sanders  EKG and CXR in epic from 02-24-14  Denies ever having a heart cath  Stress test and echo done several yrs ago

## 2014-12-21 MED ORDER — CEFAZOLIN SODIUM-DEXTROSE 2-3 GM-% IV SOLR
2.0000 g | INTRAVENOUS | Status: AC
  Administered 2014-12-22: 2 g via INTRAVENOUS
  Filled 2014-12-21: qty 50

## 2014-12-21 NOTE — Anesthesia Preprocedure Evaluation (Addendum)
Anesthesia Evaluation  Patient identified by MRN, date of birth, ID band Patient awake    Reviewed: Allergy & Precautions, NPO status , Patient's Chart, lab work & pertinent test results, reviewed documented beta blocker date and time   Airway Mallampati: II   Neck ROM: Full    Dental  (+) Edentulous Upper, Edentulous Lower   Pulmonary former smoker,  breath sounds clear to auscultation        Cardiovascular hypertension, Pt. on medications Rhythm:Regular     Neuro/Psych    GI/Hepatic negative GI ROS, Neg liver ROS,   Endo/Other  negative endocrine ROS  Renal/GU negative Renal ROS     Musculoskeletal   Abdominal (+)  Abdomen: soft.    Peds  Hematology 13/39   Anesthesia Other Findings   Reproductive/Obstetrics                           Anesthesia Physical Anesthesia Plan  ASA: II  Anesthesia Plan: General   Post-op Pain Management: MAC Combined w/ Regional for Post-op pain   Induction: Intravenous  Airway Management Planned:   Additional Equipment:   Intra-op Plan:   Post-operative Plan: Extubation in OR  Informed Consent: I have reviewed the patients History and Physical, chart, labs and discussed the procedure including the risks, benefits and alternatives for the proposed anesthesia with the patient or authorized representative who has indicated his/her understanding and acceptance.     Plan Discussed with:   Anesthesia Plan Comments:         Anesthesia Quick Evaluation

## 2014-12-21 NOTE — Progress Notes (Signed)
Pt notified of time change;to arrive at 1030

## 2014-12-22 ENCOUNTER — Inpatient Hospital Stay (HOSPITAL_COMMUNITY): Payer: Medicare Other | Admitting: Anesthesiology

## 2014-12-22 ENCOUNTER — Inpatient Hospital Stay (HOSPITAL_COMMUNITY)
Admission: RE | Admit: 2014-12-22 | Discharge: 2014-12-25 | DRG: 470 | Disposition: A | Discharge status: Home Health | Payer: Medicare Other | Source: Ambulatory Visit | Attending: Orthopaedic Surgery | Admitting: Orthopaedic Surgery

## 2014-12-22 ENCOUNTER — Encounter (HOSPITAL_COMMUNITY): Payer: Self-pay | Admitting: *Deleted

## 2014-12-22 ENCOUNTER — Encounter (HOSPITAL_COMMUNITY)
Admission: RE | Disposition: A | Discharge status: Home Health | Payer: Self-pay | Source: Ambulatory Visit | Attending: Orthopaedic Surgery

## 2014-12-22 ENCOUNTER — Inpatient Hospital Stay (HOSPITAL_COMMUNITY): Payer: Medicare Other

## 2014-12-22 DIAGNOSIS — Z471 Aftercare following joint replacement surgery: Secondary | ICD-10-CM | POA: Diagnosis not present

## 2014-12-22 DIAGNOSIS — Z79899 Other long term (current) drug therapy: Secondary | ICD-10-CM | POA: Diagnosis not present

## 2014-12-22 DIAGNOSIS — Z96651 Presence of right artificial knee joint: Secondary | ICD-10-CM | POA: Diagnosis present

## 2014-12-22 DIAGNOSIS — D649 Anemia, unspecified: Secondary | ICD-10-CM | POA: Diagnosis not present

## 2014-12-22 DIAGNOSIS — I10 Essential (primary) hypertension: Secondary | ICD-10-CM | POA: Diagnosis not present

## 2014-12-22 DIAGNOSIS — M549 Dorsalgia, unspecified: Secondary | ICD-10-CM | POA: Diagnosis present

## 2014-12-22 DIAGNOSIS — G8929 Other chronic pain: Secondary | ICD-10-CM | POA: Diagnosis present

## 2014-12-22 DIAGNOSIS — Z87891 Personal history of nicotine dependence: Secondary | ICD-10-CM

## 2014-12-22 DIAGNOSIS — G8918 Other acute postprocedural pain: Secondary | ICD-10-CM | POA: Diagnosis not present

## 2014-12-22 DIAGNOSIS — Z7982 Long term (current) use of aspirin: Secondary | ICD-10-CM

## 2014-12-22 DIAGNOSIS — E785 Hyperlipidemia, unspecified: Secondary | ICD-10-CM | POA: Diagnosis not present

## 2014-12-22 DIAGNOSIS — M1712 Unilateral primary osteoarthritis, left knee: Secondary | ICD-10-CM

## 2014-12-22 DIAGNOSIS — M25562 Pain in left knee: Secondary | ICD-10-CM | POA: Diagnosis not present

## 2014-12-22 DIAGNOSIS — Z96652 Presence of left artificial knee joint: Secondary | ICD-10-CM

## 2014-12-22 DIAGNOSIS — Z7901 Long term (current) use of anticoagulants: Secondary | ICD-10-CM | POA: Diagnosis not present

## 2014-12-22 HISTORY — PX: TOTAL KNEE ARTHROPLASTY: SHX125

## 2014-12-22 IMAGING — CR DG KNEE 1-2V PORT*L*
2 series · 2 of 2 positions shown · non-contrast
Comparison: Left knee MRI [DATE]

CLINICAL DATA: Status post total knee arthroplasty

EXAM:
PORTABLE LEFT KNEE - 1-2 VIEW

[AP]
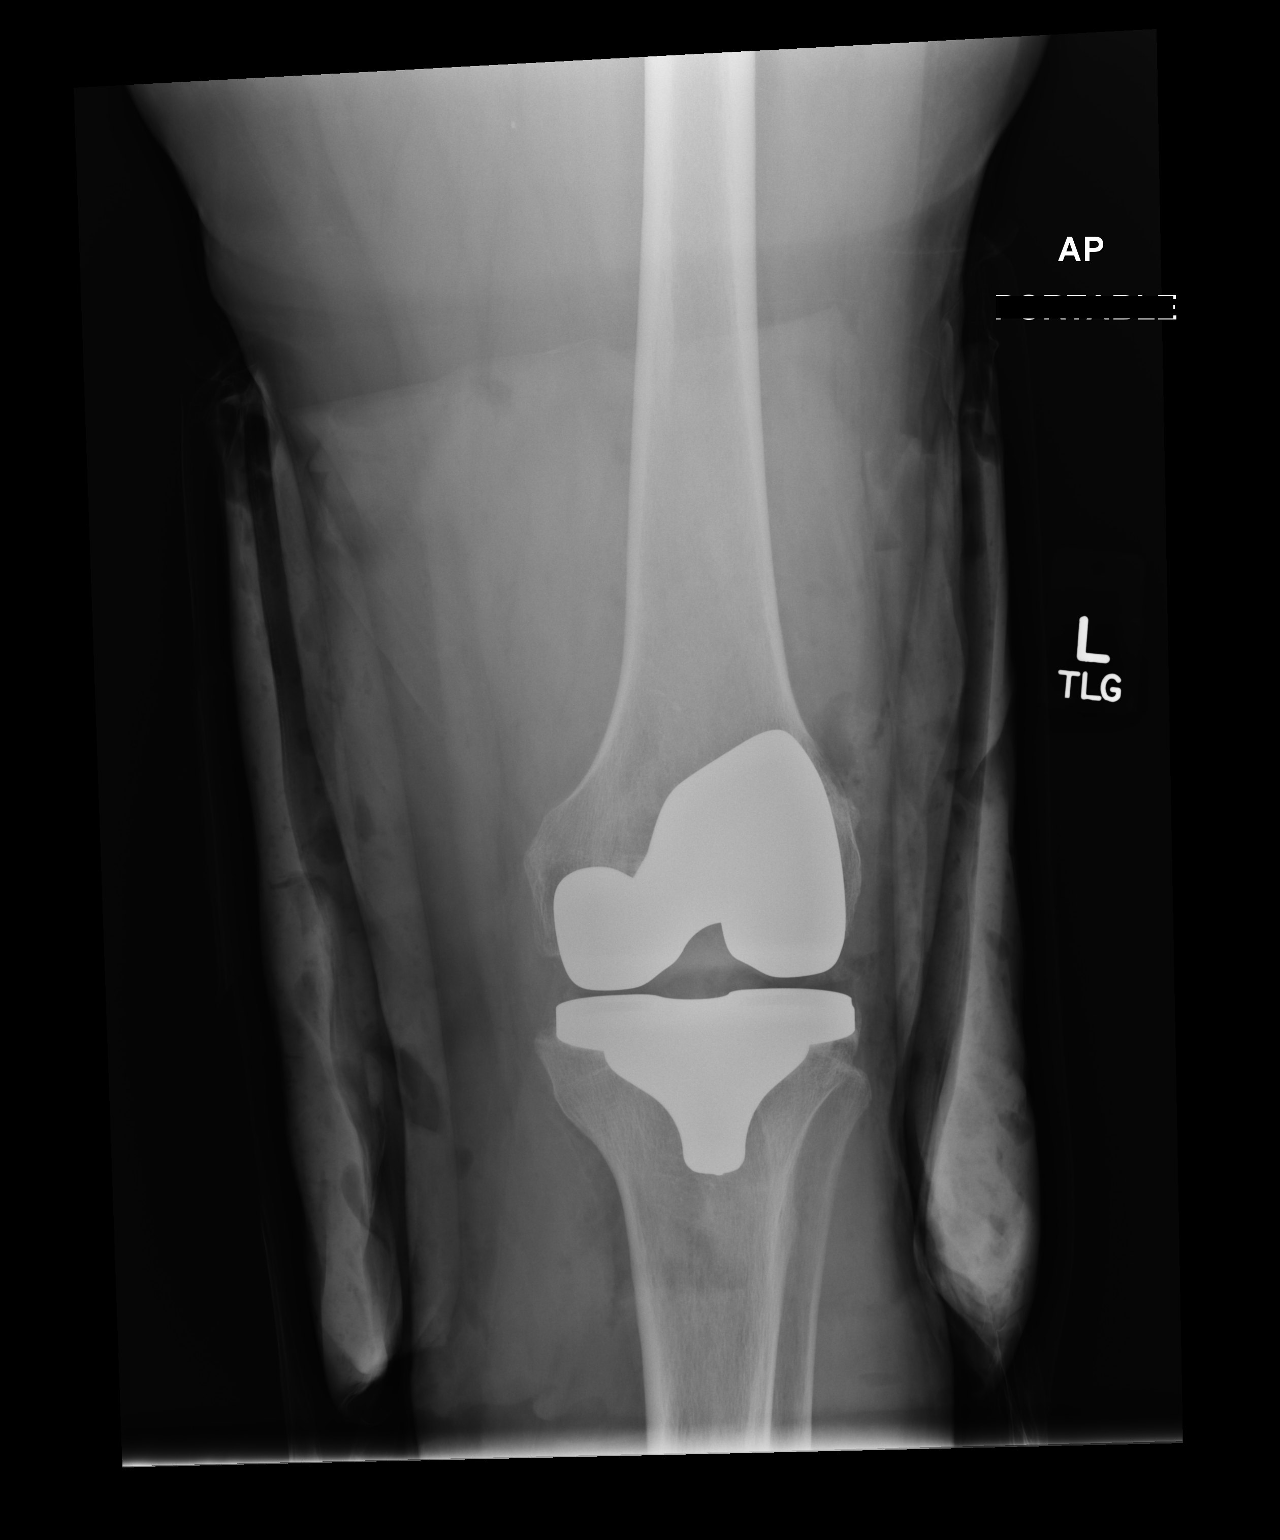

[xtable lateral]
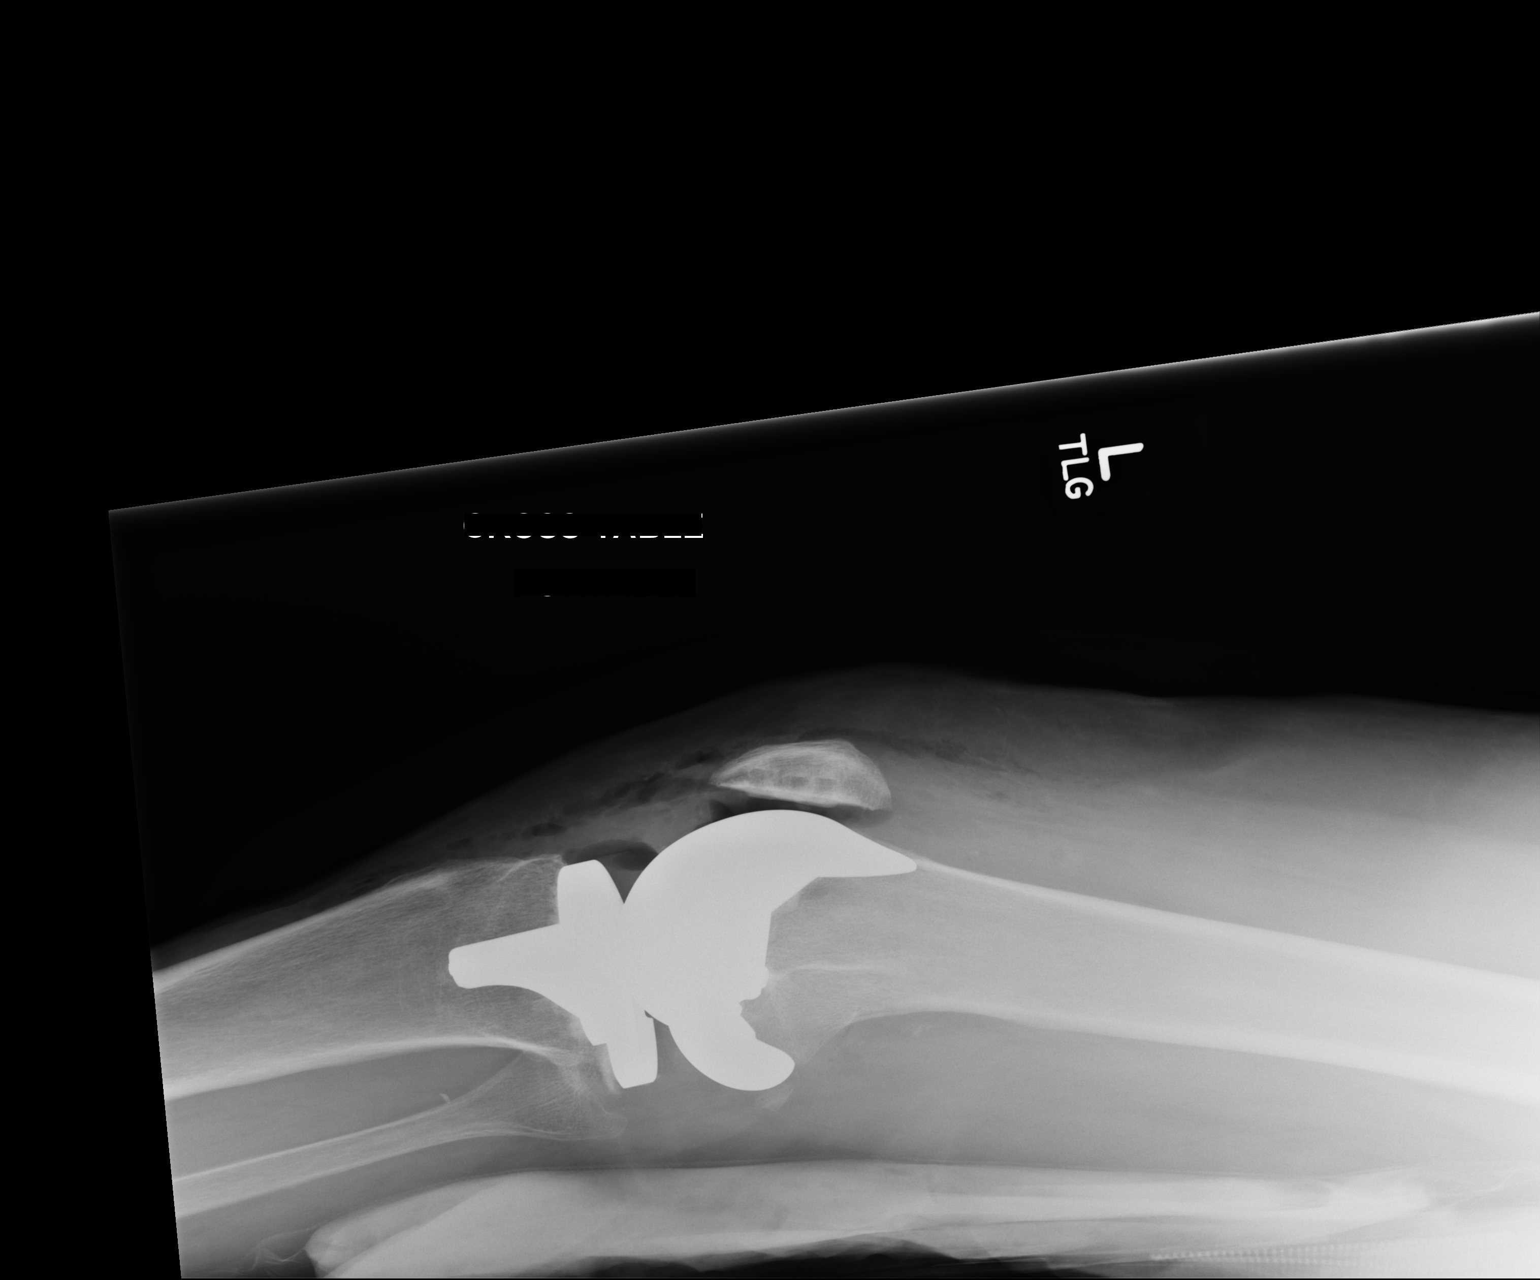

[2 of 2 positions shown; findings below may reference images not displayed]

FINDINGS: Frontal and lateral views were obtained. Patient is status post
total knee arthroplasty with femoral and tibial components appearing
well-seated. No acute fracture or dislocation. Air within the joint
is an expected postoperative finding.
IMPRESSION: Prosthetic components appear well seated. No acute fracture or
dislocation.

## 2014-12-22 SURGERY — ARTHROPLASTY, KNEE, TOTAL
Anesthesia: General | Laterality: Left

## 2014-12-22 MED ORDER — ONDANSETRON HCL 4 MG PO TABS
4.0000 mg | ORAL_TABLET | Freq: Four times a day (QID) | ORAL | Status: DC | PRN
  Administered 2014-12-23: 4 mg via ORAL
  Filled 2014-12-22: qty 1

## 2014-12-22 MED ORDER — BUPIVACAINE-EPINEPHRINE (PF) 0.5% -1:200000 IJ SOLN
INTRAMUSCULAR | Status: DC | PRN
  Administered 2014-12-22: 30 mL via PERINEURAL

## 2014-12-22 MED ORDER — ZOLPIDEM TARTRATE 5 MG PO TABS: 5.0000 mg | ORAL_TABLET | Freq: Every evening | ORAL | Status: DC | PRN

## 2014-12-22 MED ORDER — BUPIVACAINE LIPOSOME 1.3 % IJ SUSP
INTRAMUSCULAR | Status: DC | PRN
Start: 2014-12-22 — End: 2014-12-22
  Administered 2014-12-22: 20 mL

## 2014-12-22 MED ORDER — ACETAMINOPHEN 650 MG RE SUPP: 650.0000 mg | Freq: Four times a day (QID) | RECTAL | Status: DC | PRN

## 2014-12-22 MED ORDER — OXYCODONE HCL 5 MG PO TABS
5.0000 mg | ORAL_TABLET | ORAL | Status: DC | PRN
  Administered 2014-12-22: 10 mg via ORAL
  Administered 2014-12-22: 5 mg via ORAL
  Administered 2014-12-23 – 2014-12-24 (×11): 10 mg via ORAL
  Administered 2014-12-24: 5 mg via ORAL
  Administered 2014-12-25 (×2): 10 mg via ORAL
  Filled 2014-12-22 (×17): qty 2

## 2014-12-22 MED ORDER — FENTANYL CITRATE 0.05 MG/ML IJ SOLN
INTRAMUSCULAR | Status: AC
  Filled 2014-12-22: qty 2

## 2014-12-22 MED ORDER — MIDAZOLAM HCL 2 MG/2ML IJ SOLN
INTRAMUSCULAR | Status: AC
  Administered 2014-12-22: 2 mg via INTRAVENOUS
  Filled 2014-12-22: qty 2

## 2014-12-22 MED ORDER — ROCURONIUM BROMIDE 50 MG/5ML IV SOLN
INTRAVENOUS | Status: AC
  Filled 2014-12-22: qty 1

## 2014-12-22 MED ORDER — BUPIVACAINE LIPOSOME 1.3 % IJ SUSP
20.0000 mL | Freq: Once | INTRAMUSCULAR | Status: DC
  Filled 2014-12-22: qty 20

## 2014-12-22 MED ORDER — 0.9 % SODIUM CHLORIDE (POUR BTL) OPTIME
TOPICAL | Status: DC | PRN
  Administered 2014-12-22: 1000 mL

## 2014-12-22 MED ORDER — METOCLOPRAMIDE HCL 10 MG PO TABS: 5.0000 mg | ORAL_TABLET | Freq: Three times a day (TID) | ORAL | Status: DC | PRN

## 2014-12-22 MED ORDER — ACETAMINOPHEN 10 MG/ML IV SOLN
1000.0000 mg | Freq: Once | INTRAVENOUS | Status: AC
  Administered 2014-12-22: 1000 mg via INTRAVENOUS

## 2014-12-22 MED ORDER — SODIUM CHLORIDE 0.9 % IV SOLN
INTRAVENOUS | Status: DC
  Administered 2014-12-22 – 2014-12-24 (×3): via INTRAVENOUS

## 2014-12-22 MED ORDER — FENTANYL CITRATE 0.05 MG/ML IJ SOLN
INTRAMUSCULAR | Status: AC
  Filled 2014-12-22: qty 5

## 2014-12-22 MED ORDER — ONDANSETRON HCL 4 MG/2ML IJ SOLN
INTRAMUSCULAR | Status: AC
  Filled 2014-12-22: qty 2

## 2014-12-22 MED ORDER — QUINAPRIL HCL 10 MG PO TABS
40.0000 mg | ORAL_TABLET | Freq: Every day | ORAL | Status: DC
  Administered 2014-12-23 – 2014-12-24 (×2): 40 mg via ORAL
  Filled 2014-12-22 (×3): qty 4

## 2014-12-22 MED ORDER — LACTATED RINGERS IV SOLN
INTRAVENOUS | Status: DC | PRN
  Administered 2014-12-22 (×3): via INTRAVENOUS

## 2014-12-22 MED ORDER — AMLODIPINE BESYLATE 10 MG PO TABS
10.0000 mg | ORAL_TABLET | Freq: Every day | ORAL | Status: DC
  Administered 2014-12-23 – 2014-12-24 (×2): 10 mg via ORAL
  Filled 2014-12-22 (×3): qty 1

## 2014-12-22 MED ORDER — METHOCARBAMOL 500 MG PO TABS
ORAL_TABLET | ORAL | Status: AC
  Filled 2014-12-22: qty 1

## 2014-12-22 MED ORDER — SODIUM CHLORIDE 0.9 % IR SOLN
Status: DC | PRN
  Administered 2014-12-22: 3000 mL

## 2014-12-22 MED ORDER — PHENOL 1.4 % MT LIQD: 1.0000 | OROMUCOSAL | Status: DC | PRN

## 2014-12-22 MED ORDER — DIPHENHYDRAMINE HCL 12.5 MG/5ML PO ELIX: 12.5000 mg | ORAL_SOLUTION | ORAL | Status: DC | PRN

## 2014-12-22 MED ORDER — PHENYLEPHRINE HCL 10 MG/ML IJ SOLN
INTRAMUSCULAR | Status: DC | PRN
  Administered 2014-12-22 (×8): 40 ug via INTRAVENOUS
  Administered 2014-12-22: 80 ug via INTRAVENOUS
  Administered 2014-12-22: 40 ug via INTRAVENOUS
  Administered 2014-12-22 (×2): 80 ug via INTRAVENOUS

## 2014-12-22 MED ORDER — METOCLOPRAMIDE HCL 5 MG/ML IJ SOLN
5.0000 mg | Freq: Three times a day (TID) | INTRAMUSCULAR | Status: DC | PRN
  Administered 2014-12-22 – 2014-12-23 (×3): 10 mg via INTRAVENOUS
  Filled 2014-12-22 (×3): qty 2

## 2014-12-22 MED ORDER — LIDOCAINE HCL (CARDIAC) 20 MG/ML IV SOLN
INTRAVENOUS | Status: AC
  Filled 2014-12-22: qty 5

## 2014-12-22 MED ORDER — ASPIRIN EC 81 MG PO TBEC
81.0000 mg | DELAYED_RELEASE_TABLET | Freq: Every day | ORAL | Status: DC
  Administered 2014-12-22 – 2014-12-25 (×4): 81 mg via ORAL
  Filled 2014-12-22 (×4): qty 1

## 2014-12-22 MED ORDER — PROPOFOL 10 MG/ML IV BOLUS
INTRAVENOUS | Status: DC | PRN
  Administered 2014-12-22: 180 mg via INTRAVENOUS

## 2014-12-22 MED ORDER — ONDANSETRON HCL 4 MG/2ML IJ SOLN
INTRAMUSCULAR | Status: DC | PRN
  Administered 2014-12-22: 4 mg via INTRAVENOUS

## 2014-12-22 MED ORDER — VITAMIN D-3 25 MCG (1000 UT) PO CAPS: ORAL_CAPSULE | Freq: Every day | ORAL | Status: DC

## 2014-12-22 MED ORDER — DEXAMETHASONE SODIUM PHOSPHATE 4 MG/ML IJ SOLN
INTRAMUSCULAR | Status: AC
  Filled 2014-12-22: qty 1

## 2014-12-22 MED ORDER — HYDROMORPHONE HCL 1 MG/ML IJ SOLN
INTRAMUSCULAR | Status: AC
  Filled 2014-12-22: qty 1

## 2014-12-22 MED ORDER — ONDANSETRON HCL 4 MG/2ML IJ SOLN
4.0000 mg | Freq: Four times a day (QID) | INTRAMUSCULAR | Status: DC | PRN
  Administered 2014-12-22 – 2014-12-23 (×3): 4 mg via INTRAVENOUS
  Filled 2014-12-22 (×3): qty 2

## 2014-12-22 MED ORDER — CEFAZOLIN SODIUM 1-5 GM-% IV SOLN
1.0000 g | Freq: Four times a day (QID) | INTRAVENOUS | Status: AC
  Administered 2014-12-22 – 2014-12-23 (×2): 1 g via INTRAVENOUS
  Filled 2014-12-22 (×2): qty 50

## 2014-12-22 MED ORDER — ACETAMINOPHEN 325 MG PO TABS
650.0000 mg | ORAL_TABLET | Freq: Four times a day (QID) | ORAL | Status: DC | PRN
  Administered 2014-12-23: 650 mg via ORAL
  Filled 2014-12-22: qty 2

## 2014-12-22 MED ORDER — FENTANYL CITRATE 0.05 MG/ML IJ SOLN
25.0000 ug | INTRAMUSCULAR | Status: DC | PRN
  Administered 2014-12-22 (×3): 50 ug via INTRAVENOUS

## 2014-12-22 MED ORDER — LACTATED RINGERS IV SOLN
INTRAVENOUS | Status: DC
  Administered 2014-12-22: 11:00:00 via INTRAVENOUS

## 2014-12-22 MED ORDER — OXYCODONE HCL ER 10 MG PO T12A
20.0000 mg | EXTENDED_RELEASE_TABLET | Freq: Two times a day (BID) | ORAL | Status: DC
Start: 2014-12-22 — End: 2014-12-25
  Administered 2014-12-22 – 2014-12-25 (×6): 20 mg via ORAL
  Filled 2014-12-22 (×6): qty 2

## 2014-12-22 MED ORDER — METHOCARBAMOL 500 MG PO TABS
500.0000 mg | ORAL_TABLET | Freq: Four times a day (QID) | ORAL | Status: DC | PRN
Start: 2014-12-22 — End: 2014-12-25
  Administered 2014-12-22 – 2014-12-24 (×4): 500 mg via ORAL
  Filled 2014-12-22 (×5): qty 1

## 2014-12-22 MED ORDER — MEPERIDINE HCL 25 MG/ML IJ SOLN: 6.2500 mg | INTRAMUSCULAR | Status: DC | PRN

## 2014-12-22 MED ORDER — LIDOCAINE HCL (CARDIAC) 20 MG/ML IV SOLN
INTRAVENOUS | Status: DC | PRN
  Administered 2014-12-22: 100 mg via INTRAVENOUS

## 2014-12-22 MED ORDER — VITAMIN B-6 100 MG PO TABS
100.0000 mg | ORAL_TABLET | Freq: Every day | ORAL | Status: DC
  Administered 2014-12-22 – 2014-12-24 (×3): 100 mg via ORAL
  Filled 2014-12-22 (×6): qty 1

## 2014-12-22 MED ORDER — FENTANYL CITRATE 0.05 MG/ML IJ SOLN
INTRAMUSCULAR | Status: DC | PRN
  Administered 2014-12-22: 50 ug via INTRAVENOUS
  Administered 2014-12-22: 100 ug via INTRAVENOUS
  Administered 2014-12-22: 50 ug via INTRAVENOUS
  Administered 2014-12-22: 100 ug via INTRAVENOUS
  Administered 2014-12-22 (×2): 50 ug via INTRAVENOUS

## 2014-12-22 MED ORDER — MENTHOL 3 MG MT LOZG: 1.0000 | LOZENGE | OROMUCOSAL | Status: DC | PRN

## 2014-12-22 MED ORDER — PROMETHAZINE HCL 25 MG/ML IJ SOLN: 6.2500 mg | INTRAMUSCULAR | Status: DC | PRN

## 2014-12-22 MED ORDER — PHENYLEPHRINE 40 MCG/ML (10ML) SYRINGE FOR IV PUSH (FOR BLOOD PRESSURE SUPPORT)
PREFILLED_SYRINGE | INTRAVENOUS | Status: AC
  Filled 2014-12-22: qty 10

## 2014-12-22 MED ORDER — TRANEXAMIC ACID 100 MG/ML IV SOLN
1000.0000 mg | INTRAVENOUS | Status: DC
  Filled 2014-12-22: qty 10

## 2014-12-22 MED ORDER — PROPOFOL 10 MG/ML IV BOLUS
INTRAVENOUS | Status: AC
  Filled 2014-12-22: qty 20

## 2014-12-22 MED ORDER — MIDAZOLAM HCL 2 MG/2ML IJ SOLN
INTRAMUSCULAR | Status: AC
  Filled 2014-12-22: qty 2

## 2014-12-22 MED ORDER — FENTANYL CITRATE 0.05 MG/ML IJ SOLN
INTRAMUSCULAR | Status: AC
  Administered 2014-12-22: 50 ug via INTRAVENOUS
  Filled 2014-12-22: qty 2

## 2014-12-22 MED ORDER — HYDROMORPHONE HCL 1 MG/ML IJ SOLN
1.0000 mg | INTRAMUSCULAR | Status: DC | PRN
  Administered 2014-12-22 – 2014-12-23 (×4): 1 mg via INTRAVENOUS
  Filled 2014-12-22 (×4): qty 1

## 2014-12-22 MED ORDER — OXYCODONE HCL 5 MG PO TABS
ORAL_TABLET | ORAL | Status: AC
  Filled 2014-12-22: qty 1

## 2014-12-22 MED ORDER — DOCUSATE SODIUM 100 MG PO CAPS
100.0000 mg | ORAL_CAPSULE | Freq: Two times a day (BID) | ORAL | Status: DC
  Administered 2014-12-22 – 2014-12-25 (×6): 100 mg via ORAL
  Filled 2014-12-22 (×6): qty 1

## 2014-12-22 MED ORDER — RIVAROXABAN 10 MG PO TABS
10.0000 mg | ORAL_TABLET | Freq: Every day | ORAL | Status: DC
  Administered 2014-12-23 – 2014-12-25 (×3): 10 mg via ORAL
  Filled 2014-12-22 (×3): qty 1

## 2014-12-22 MED ORDER — ALUM & MAG HYDROXIDE-SIMETH 200-200-20 MG/5ML PO SUSP: 30.0000 mL | ORAL | Status: DC | PRN

## 2014-12-22 MED ORDER — HYDROMORPHONE HCL 1 MG/ML IJ SOLN
0.2500 mg | INTRAMUSCULAR | Status: DC | PRN
  Administered 2014-12-22 (×4): 0.5 mg via INTRAVENOUS

## 2014-12-22 MED ORDER — SODIUM CHLORIDE 0.9 % IJ SOLN
INTRAMUSCULAR | Status: DC | PRN
  Administered 2014-12-22: 40 mL via INTRAVENOUS

## 2014-12-22 MED ORDER — TRANEXAMIC ACID 100 MG/ML IV SOLN
1000.0000 mg | INTRAVENOUS | Status: AC
  Administered 2014-12-22: 1000 mg via INTRAVENOUS

## 2014-12-22 MED ORDER — DEXTROSE 5 % IV SOLN
500.0000 mg | Freq: Four times a day (QID) | INTRAVENOUS | Status: DC | PRN
  Administered 2014-12-23: 500 mg via INTRAVENOUS
  Filled 2014-12-22 (×2): qty 5

## 2014-12-22 SURGICAL SUPPLY — 63 items
BANDAGE ELASTIC 6 VELCRO ST LF (GAUZE/BANDAGES/DRESSINGS) ×2 IMPLANT
BANDAGE ESMARK 6X9 LF (GAUZE/BANDAGES/DRESSINGS) ×1 IMPLANT
BENZOIN TINCTURE PRP APPL 2/3 (GAUZE/BANDAGES/DRESSINGS) ×2 IMPLANT
BLADE SAG 18X100X1.27 (BLADE) ×2 IMPLANT
BNDG ESMARK 6X9 LF (GAUZE/BANDAGES/DRESSINGS) ×2
BOWL SMART MIX CTS (DISPOSABLE) IMPLANT
CAPT KNEE TOTAL 3 ×2 IMPLANT
CEMENT BONE SIMPLEX SPEEDSET (Cement) ×4 IMPLANT
COVER SURGICAL LIGHT HANDLE (MISCELLANEOUS) ×2 IMPLANT
CUFF TOURNIQUET SINGLE 34IN LL (TOURNIQUET CUFF) ×2 IMPLANT
DRAPE IMP U-DRAPE 54X76 (DRAPES) ×2 IMPLANT
DRAPE INCISE IOBAN 66X45 STRL (DRAPES) ×2 IMPLANT
DRAPE ORTHO SPLIT 77X108 STRL (DRAPES) ×2
DRAPE PROXIMA HALF (DRAPES) ×2 IMPLANT
DRAPE SURG ORHT 6 SPLT 77X108 (DRAPES) ×2 IMPLANT
DRAPE U-SHAPE 47X51 STRL (DRAPES) ×2 IMPLANT
DRSG PAD ABDOMINAL 8X10 ST (GAUZE/BANDAGES/DRESSINGS) ×2 IMPLANT
DURAPREP 26ML APPLICATOR (WOUND CARE) ×2 IMPLANT
ELECT REM PT RETURN 9FT ADLT (ELECTROSURGICAL) ×2
ELECTRODE REM PT RTRN 9FT ADLT (ELECTROSURGICAL) ×1 IMPLANT
EVACUATOR 1/8 PVC DRAIN (DRAIN) IMPLANT
FACESHIELD WRAPAROUND (MASK) ×4 IMPLANT
GAUZE SPONGE 4X4 12PLY STRL (GAUZE/BANDAGES/DRESSINGS) ×2 IMPLANT
GAUZE XEROFORM 1X8 LF (GAUZE/BANDAGES/DRESSINGS) ×2 IMPLANT
GLOVE BIO SURGEON STRL SZ8 (GLOVE) IMPLANT
GLOVE BIOGEL PI IND STRL 8 (GLOVE) IMPLANT
GLOVE BIOGEL PI INDICATOR 8 (GLOVE)
GLOVE ECLIPSE 7.0 STRL STRAW (GLOVE) IMPLANT
GLOVE ORTHO TXT STRL SZ7.5 (GLOVE) IMPLANT
GLOVE SURG SS PI 7.0 STRL IVOR (GLOVE) ×4 IMPLANT
GLOVE SURG SS PI 7.5 STRL IVOR (GLOVE) ×4 IMPLANT
GLOVE SURG SS PI 8.0 STRL IVOR (GLOVE) ×4 IMPLANT
GOWN STRL REUS W/ TWL LRG LVL3 (GOWN DISPOSABLE) ×1 IMPLANT
GOWN STRL REUS W/ TWL XL LVL3 (GOWN DISPOSABLE) ×2 IMPLANT
GOWN STRL REUS W/TWL LRG LVL3 (GOWN DISPOSABLE) ×1
GOWN STRL REUS W/TWL XL LVL3 (GOWN DISPOSABLE) ×2
HANDPIECE INTERPULSE COAX TIP (DISPOSABLE) ×1
IMMOBILIZER KNEE 22 UNIV (SOFTGOODS) ×2 IMPLANT
KIT BASIN OR (CUSTOM PROCEDURE TRAY) ×2 IMPLANT
KIT ROOM TURNOVER OR (KITS) ×2 IMPLANT
MANIFOLD NEPTUNE II (INSTRUMENTS) ×2 IMPLANT
NS IRRIG 1000ML POUR BTL (IV SOLUTION) ×2 IMPLANT
PACK TOTAL JOINT (CUSTOM PROCEDURE TRAY) ×2 IMPLANT
PACK UNIVERSAL I (CUSTOM PROCEDURE TRAY) ×2 IMPLANT
PAD ARMBOARD 7.5X6 YLW CONV (MISCELLANEOUS) ×4 IMPLANT
PADDING CAST COTTON 6X4 STRL (CAST SUPPLIES) ×2 IMPLANT
SET HNDPC FAN SPRY TIP SCT (DISPOSABLE) ×1 IMPLANT
SET PAD KNEE POSITIONER (MISCELLANEOUS) ×2 IMPLANT
STAPLER VISISTAT 35W (STAPLE) IMPLANT
STRIP CLOSURE SKIN 1/2X4 (GAUZE/BANDAGES/DRESSINGS) ×2 IMPLANT
SUCTION FRAZIER TIP 10 FR DISP (SUCTIONS) IMPLANT
SUT VIC AB 0 CT1 27 (SUTURE) ×1
SUT VIC AB 0 CT1 27XBRD ANBCTR (SUTURE) ×1 IMPLANT
SUT VIC AB 1 CT1 27 (SUTURE) ×2
SUT VIC AB 1 CT1 27XBRD ANBCTR (SUTURE) ×2 IMPLANT
SUT VIC AB 2-0 CT1 27 (SUTURE) ×2
SUT VIC AB 2-0 CT1 TAPERPNT 27 (SUTURE) ×2 IMPLANT
SUT VIC AB 4-0 PS2 27 (SUTURE) ×2 IMPLANT
TOWEL OR 17X24 6PK STRL BLUE (TOWEL DISPOSABLE) ×2 IMPLANT
TOWEL OR 17X26 10 PK STRL BLUE (TOWEL DISPOSABLE) ×2 IMPLANT
TRAY FOLEY CATH 16FRSI W/METER (SET/KITS/TRAYS/PACK) IMPLANT
WATER STERILE IRR 1000ML POUR (IV SOLUTION) ×4 IMPLANT
WRAP KNEE MAXI GEL POST OP (GAUZE/BANDAGES/DRESSINGS) ×2 IMPLANT

## 2014-12-22 NOTE — Transfer of Care (Signed)
Immediate Anesthesia Transfer of Care Note  Patient: [name]  Procedure(s) Performed: Procedure(s): LEFT TOTAL KNEE ARTHROPLASTY (Left)  Patient Location: PACU  Anesthesia Type:General  Level of Consciousness: awake, oriented and patient cooperative  Airway & Oxygen Therapy: Patient Spontanous Breathing and Patient connected to face mask oxygen  Post-op Assessment: Report given to RN and Post -op Vital signs reviewed and stable  Post vital signs: Reviewed  Last Vitals:  Filed Vitals:   12/22/14 1113  BP: 149/72  Pulse: 63  Temp:   Resp: 20    Complications: No apparent anesthesia complications

## 2014-12-22 NOTE — Progress Notes (Signed)
Orthopedic Tech Progress Note Patient Details:  Ryan Craig [birth year] [id number]  CPM Left Knee CPM Left Knee: On Left Knee Flexion (Degrees): 90 Left Knee Extension (Degrees): 0 Additional Comments: trapeze bar patient helper Viewed order from doctor's order list  Hildred Priest 12/22/2014, 3:15 PM

## 2014-12-22 NOTE — Anesthesia Procedure Notes (Addendum)
Anesthesia Regional Block:  Adductor canal block  Pre-Anesthetic Checklist: ,, timeout performed, Correct Patient, Correct Site, Correct Laterality, Correct Procedure, Correct Position, site marked, Risks and benefits discussed,  Surgical consent,  Pre-op evaluation,  At surgeon's request and post-op pain management  Laterality: Left and Lower  Prep: Maximum Sterile Barrier Precautions used       Needles:  Injection technique: Single-shot  Needle Type: Echogenic Stimulator Needle     Needle Length: 9cm 9 cm Needle Gauge: 21 and 21 G    Additional Needles:  Procedures: ultrasound guided (picture in chart) Adductor canal block Narrative:  Start time: 12/22/2014 11:32 AM End time: 12/22/2014 11:42 AM Injection made incrementally with aspirations every 5 mL.  Performed by: Personally  Anesthesiologist: Alexis Frock  Additional Notes: L AC block done with [place], 44ml .5% marcaine with epi, multiple asp, talked to patient throughout, no complications   Procedure Name: LMA Insertion Date/Time: 12/22/2014 12:38 PM Performed by: Luciana Axe K Pre-anesthesia Checklist: Patient identified, Emergency Drugs available, Suction available, Patient being monitored and Timeout performed Patient Re-evaluated:Patient Re-evaluated prior to inductionOxygen Delivery Method: Circle system utilized Preoxygenation: Pre-oxygenation with 100% oxygen Intubation Type: IV induction Ventilation: Mask ventilation without difficulty LMA: LMA inserted LMA Size: 4.0 Number of attempts: 1 Placement Confirmation: positive ETCO2,  CO2 detector and breath sounds checked- equal and bilateral Tube secured with: Tape Dental Injury: Teeth and Oropharynx as per pre-operative assessment

## 2014-12-22 NOTE — Brief Op Note (Signed)
12/22/2014  2:11 PM  PATIENT:  Ryan Craig  69 y.o. male  PRE-OPERATIVE DIAGNOSIS:  primary osteoarthritis left knee  POST-OPERATIVE DIAGNOSIS:  primary osteoarthritis left knee  PROCEDURE:  Procedure(s): LEFT TOTAL KNEE ARTHROPLASTY (Left)  SURGEON:  Surgeon(s) and Role:    * Mcarthur Rossetti, MD - Primary  PHYSICIAN ASSISTANT: Benita Stabile, PA-C  ANESTHESIA:   local, regional and general  EBL:  Total I/O In: 1000 [I.V.:1000] Out: -   BLOOD ADMINISTERED:none  DRAINS: none   LOCAL MEDICATIONS USED:  OTHER Experil  SPECIMEN:  No Specimen  DISPOSITION OF SPECIMEN:  N/A  COUNTS:  YES  TOURNIQUET:   Total Tourniquet Time Documented: Thigh (laterality) - 53 minutes Total: Thigh (laterality) - 53 minutes   DICTATION: .Other Dictation: Dictation Number [id number]  PLAN OF CARE: Admit to inpatient   PATIENT DISPOSITION:  PACU - hemodynamically stable.   Delay start of Pharmacological VTE agent (>24hrs) due to surgical blood loss or risk of bleeding: no

## 2014-12-22 NOTE — Progress Notes (Signed)
PACU IV Fentanyl not providing pain relief for pt. Dr Kalman [facility] gone) updated, new order for IV Dilaudid. Will cont to  Monitor.

## 2014-12-22 NOTE — H&P (Signed)
TOTAL KNEE ADMISSION H&P  Patient is being admitted for left total knee arthroplasty.  Subjective:  Chief Complaint:left knee pain.  HPI: Ryan Craig, 69 y.o. male, has a history of pain and functional disability in the left knee due to arthritis and has failed non-surgical conservative treatments for greater than 12 weeks to includeNSAID's and/or analgesics, corticosteriod injections, flexibility and strengthening excercises and activity modification.  Onset of symptoms was gradual, starting 3 years ago with gradually worsening course since that time. The patient noted prior procedures on the knee to include  arthroscopy on the left knee(s).  Patient currently rates pain in the left knee(s) at 10 out of 10 with activity. Patient has night pain, worsening of pain with activity and weight bearing, pain that interferes with activities of daily living, pain with passive range of motion, crepitus and joint swelling.  Patient has evidence of subchondral sclerosis, periarticular osteophytes and joint space narrowing by imaging studies. There is no active infection.  Patient Active Problem List   Diagnosis Date Noted  . Osteoarthritis of left knee 12/22/2014  . Acute medial meniscus tear of left knee 02/24/2014   Past Medical History  Diagnosis Date  . Hypertension     takes Amlodipine and Quinapril daily  . Hyperlipidemia     was on Lipitor but was taken off 3-67yrs ago by medical md bc running liver enzymes up  . Arthritis   . Joint pain   . Joint swelling   . Chronic back pain     deteriorating  . Anemia     no on any meds    Past Surgical History  Procedure Laterality Date  . Arthroscopic knee surgery Right   . Colonoscopy    . Knee arthroscopy Left 02/24/2014    Procedure: LEFT KNEE ARTHROSCOPY WITH PARTIAL MEDIAL MENISCECTOMY;  Surgeon: Mcarthur Rossetti, MD;  Location: [facility];  Service: Orthopedics;  Laterality: Left;  . Inguinal hernia repair Bilateral     x 4  . Joint  replacement Right 6 yrs ago    knee    Prescriptions prior to admission  Medication Sig Dispense Refill Last Dose  . amLODipine (NORVASC) 10 MG tablet Take 10 mg by mouth daily.   12/22/2014 at Unknown time  . aspirin EC 81 MG tablet Take 81 mg by mouth daily.   Past Week at Unknown time  . Cholecalciferol (VITAMIN D-3 PO) Take 2 tablets by mouth daily.    12/21/2014 at Unknown time  . HYDROcodone-acetaminophen (NORCO/VICODIN) 5-325 MG per tablet Take 1 tablet by mouth every 6 (six) hours as needed for moderate pain.   12/22/2014 at Unknown time  . Pyridoxine HCl (VITAMIN B-6 PO) Take 100 mg by mouth daily.    12/21/2014 at Unknown time  . quinapril (ACCUPRIL) 40 MG tablet Take 40 mg by mouth at bedtime.   12/22/2014 at Unknown time  . oxyCODONE-acetaminophen (ROXICET) 5-325 MG per tablet Take 1-2 tablets by mouth every 4 (four) hours as needed for severe pain. 60 tablet 0    Allergies  Allergen Reactions  . Contrast Media [Iodinated Diagnostic Agents] Anaphylaxis  . Latex Rash    History  Substance Use Topics  . Smoking status: Former Research scientist (life sciences)  . Smokeless tobacco: Never Used     Comment: quit smoking about 6-7ytrs ago  . Alcohol Use: No     Comment: drank over 40 years ago    History reviewed. No pertinent family history.   Review of Systems  Musculoskeletal: Positive for  joint pain.  All other systems reviewed and are negative.   Objective:  Physical Exam  Constitutional: He is oriented to person, place, and time. He appears well-developed and well-nourished.  HENT:  Head: Normocephalic and atraumatic.  Eyes: EOM are normal. Pupils are equal, round, and reactive to light.  Neck: Normal range of motion. Neck supple.  Cardiovascular: Normal rate and regular rhythm.   Respiratory: Effort normal and breath sounds normal.  GI: Soft. Bowel sounds are normal.  Musculoskeletal:       Left knee: He exhibits decreased range of motion, swelling and bony tenderness. Tenderness found. Medial  joint line and lateral joint line tenderness noted.  Neurological: He is alert and oriented to person, place, and time.  Skin: Skin is warm and dry.  Psychiatric: He has a normal mood and affect.    Vital signs in last 24 hours: Temp:  [98.1 F (36.7 C)-98.7 F (37.1 C)] 98.3 F (36.8 C) (03/01 1032) Pulse Rate:  [63-77] 63 (03/01 1113) Resp:  [12-20] 20 (03/01 1113) BP: (112-149)/(66-74) 149/72 mmHg (03/01 1113) SpO2:  [98 %-100 %] 100 % (03/01 1113) Weight:  [98.113 kg (216 lb 4.8 oz)] 98.113 kg (216 lb 4.8 oz) (03/01 0953)  Labs:   Estimated body mass index is 33.87 kg/(m^2) as calculated from the following:   Height as of this encounter: 5\' 7"  (1.702 m).   Weight as of this encounter: 98.113 kg (216 lb 4.8 oz).   Imaging Review Plain radiographs demonstrate severe degenerative joint disease of the left knee(s). The overall alignment ismild varus. The bone quality appears to be good for age and reported activity level.  Assessment/Plan:  End stage arthritis, left knee   The patient history, physical examination, clinical judgment of the provider and imaging studies are consistent with end stage degenerative joint disease of the left knee(s) and total knee arthroplasty is deemed medically necessary. The treatment options including medical management, injection therapy arthroscopy and arthroplasty were discussed at length. The risks and benefits of total knee arthroplasty were presented and reviewed. The risks due to aseptic loosening, infection, stiffness, patella tracking problems, thromboembolic complications and other imponderables were discussed. The patient acknowledged the explanation, agreed to proceed with the plan and consent was signed. Patient is being admitted for inpatient treatment for surgery, pain control, PT, OT, prophylactic antibiotics, VTE prophylaxis, progressive ambulation and ADL's and discharge planning. The patient is planning to be discharged home with home  health services

## 2014-12-23 ENCOUNTER — Encounter (HOSPITAL_COMMUNITY): Payer: Self-pay | Admitting: Orthopaedic Surgery

## 2014-12-23 LAB — BASIC METABOLIC PANEL
Anion gap: 10 (ref 5–15)
BUN: 10 mg/dL (ref 6–23)
CO2: 22 mmol/L (ref 19–32)
Calcium: 9 mg/dL (ref 8.4–10.5)
Chloride: 104 mmol/L (ref 96–112)
Creatinine, Ser: 0.85 mg/dL (ref 0.50–1.35)
GFR calc Af Amer: >90 mL/min (ref >90)
GFR calc non Af Amer: 86 mL/min — ABNORMAL LOW (ref >90)
Glucose, Bld: 136 mg/dL — ABNORMAL HIGH (ref 70–99)
Potassium: 4.4 mmol/L (ref 3.5–5.1)
Sodium: 136 mmol/L (ref 135–145)

## 2014-12-23 LAB — CBC
HCT: 37 % — ABNORMAL LOW (ref 39.0–52.0)
Hemoglobin: 12.2 g/dL — ABNORMAL LOW (ref 13.0–17.0)
MCH: 28.8 pg (ref 26.0–34.0)
MCHC: 33 g/dL (ref 30.0–36.0)
MCV: 87.5 fL (ref 78.0–100.0)
Platelets: 274 10*3/uL (ref 150–400)
RBC: 4.23 MIL/uL (ref 4.22–5.81)
RDW: 12.4 % (ref 11.5–15.5)
WBC: 13.2 10*3/uL — ABNORMAL HIGH (ref 4.0–10.5)

## 2014-12-23 NOTE — Progress Notes (Signed)
Physical Therapy Treatment Patient Details Name: Ryan Craig MRN: [id number] DOB: [birth year] Today's Date: 12/23/2014    History of Present Illness 69 y.o. male admitted to [facility] on 12/22/14 for elective L TKA.  Pt with significant PMHx of HTN, chronic back pain, anemia, and R TKA.      PT Comments    Pt is progressing well with his mobility.  He was able to progress gait into the hallway and preform more of his knee exercises this PM. He continues to make strides towards d/c home every session.  We will initiate stair training tomorrow AM.    Follow Up Recommendations  Home health PT     Equipment Recommendations  None recommended by PT    Recommendations for Other Services   NA     Precautions / Restrictions Precautions Precautions: Knee Precaution Booklet Issued: Yes (comment) Precaution Comments: reviewed no pillow under operative knee.   Required Braces or Orthoses: Knee Immobilizer - Left Knee Immobilizer - Left: Other (comment) (not ordered, but used, poor quad set) Restrictions LLE Weight Bearing: Weight bearing as tolerated    Mobility  Bed Mobility Overal bed mobility: Needs Assistance Bed Mobility: Supine to Sit;Sit to Supine     Supine to sit: Min assist Sit to supine: Min assist   General bed mobility comments: Min assist to help progress left leg over EOB.  Pt wanting wife to help pull him up to sitting and will need encouragement to do it on his own (he can physically do it).   Transfers Overall transfer level: Needs assistance Equipment used: Rolling walker (2 wheeled) Transfers: Sit to/from Stand Sit to Stand: Supervision         General transfer comment: supervision for safety. Verbal cues for reminders of safe hand placement during transitions as he likes to push off of the RW to stand.   Ambulation/Gait Ambulation/Gait assistance: Min guard Ambulation Distance (Feet): 55 Feet Assistive device: Rolling walker (2 wheeled) Gait  Pattern/deviations: Step-to pattern;Antalgic;Trunk flexed     General Gait Details: Pt with moderately antalgic gait pattern, verbal cues needed for upright posture.  Assist needed for safety due to gait pattern and the occational shap pain when he missteps.           Balance Overall balance assessment: Needs assistance Sitting-balance support: Feet supported;No upper extremity supported Sitting balance-Leahy Scale: Good     Standing balance support: Bilateral upper extremity supported;No upper extremity supported;Single extremity supported Standing balance-Leahy Scale: Fair                      Cognition Arousal/Alertness: Awake/alert Behavior During Therapy: WFL for tasks assessed/performed Overall Cognitive Status: Within Functional Limits for tasks assessed                      Exercises Total Joint Exercises Short Arc QuadSinclair Craig;Left;10 reps;Supine Hip ABduction/ADduction: AAROM;Left;10 reps;Supine Straight Leg Raises: AAROM;Left;10 reps;Supine        Pertinent Vitals/Pain Pain Assessment: 0-10 Pain Score: 7  Pain Location: left knee Pain Descriptors / Indicators: Aching;Burning Pain Intervention(s): Limited activity within patient's tolerance;Monitored during session;Premedicated before session;Repositioned;Ice applied           PT Goals (current goals can now be found in the care plan section) Acute Rehab PT Goals Patient Stated Goal: to go home at discharge Progress towards PT goals: Progressing toward goals    Frequency  7X/week    PT Plan Current plan remains appropriate  End of Session Equipment Utilized During Treatment: Left knee immobilizer Activity Tolerance: Patient limited by pain Patient left: in CPM;with call bell/phone within reach;in bed;with family/visitor present     Time: 8937-3428 PT Time Calculation (min) (ACUTE ONLY): 33 min  Charges:  $Gait Training: 8-22 mins $Therapeutic Exercise: 8-22 mins                      Ryan Craig, [place], DPT [phone]   12/23/2014, 5:08 PM

## 2014-12-23 NOTE — Progress Notes (Signed)
OT Cancellation Note  Patient Details Name: Ryan Craig MRN: [id number] DOB: [birth year]   Cancelled Treatment:    Reason Eval/Treat Not Completed: Pain limiting ability to participate. OT will follow up tomorrow to evaluate pt.   [place] Herb M   Cyndie Chime, OTR/L Occupational Therapist [phone] (pager)  12/23/2014, 5:56 PM

## 2014-12-23 NOTE — Care Management Note (Signed)
CARE MANAGEMENT NOTE 12/23/2014  Patient:  Ryan Craig, Ryan Craig   Account Number:  [id number]  Date Initiated:  12/23/2014  Documentation initiated by:  Ricki Miller  Subjective/Objective Assessment:   69 yr old male admitted with left knee osteoarthritis. Patient underwent a left total knee arthroplasty. Lives home with wife.     Action/Plan:   CM spoke with patient and wife concerning home health and DME needs . Preoperatively setup with [facility], no changes. Patient has RW and 3in1. Has support at discharge.   Anticipated DC Date:  12/24/2014   Anticipated DC Plan:  [place]  CM consult      [facility] Choice  HOME HEALTH   Choice offered to / List presented to:  C-1 Patient   DME arranged  NA        [place] arranged  HH-2 PT      [place]   Status of service:  Completed, signed off Medicare Important Message given?  NA - LOS <3 / Initial given by admissions (If response is "NO", the following Medicare IM given date fields will be blank) Date Medicare IM given:   Medicare IM given by:   Date Additional Medicare IM given:   Additional Medicare IM given by:    Discharge Disposition:  [place]  Per UR Regulation:  Reviewed for med. necessity/level of care/duration of stay  If discussed at [place] of Stay Meetings, dates discussed:    Comments:

## 2014-12-23 NOTE — Anesthesia Postprocedure Evaluation (Signed)
  Anesthesia Post-op Note  Patient: Ryan Craig  Procedure(s) Performed: Procedure(s): LEFT TOTAL KNEE ARTHROPLASTY (Left)  Patient Location: PACU  Anesthesia Type:General and block  Level of Consciousness: awake and alert   Airway and Oxygen Therapy: Patient Spontanous Breathing  Post-op Pain: moderate  Post-op Assessment: Post-op Vital signs reviewed, Patient's Cardiovascular Status Stable and Respiratory Function Stable  Post-op Vital Signs: Reviewed  Filed Vitals:   12/23/14 0521  BP: 132/69  Pulse: 81  Temp: 36.7 C  Resp: 15    Complications: No apparent anesthesia complications

## 2014-12-23 NOTE — Op Note (Signed)
NAME:  Ryan Craig, Ryan Craig NO.:  [id number]  MEDICAL RECORD NO.:  [id number]  LOCATION:  [id number]                        FACILITY:  [facility]  PHYSICIAN:  Lind Guest. Ninfa Linden, M.D.DATE OF BIRTH:  09-Jan-1946  DATE OF PROCEDURE:  12/22/2014 DATE OF DISCHARGE:                              OPERATIVE REPORT   PREOPERATIVE DIAGNOSES:  Primary osteoarthritis and degenerative joint disease of the left knee.  POSTOPERATIVE DIAGNOSES:  Primary osteoarthritis and degenerative joint disease of the left knee.  PROCEDURE:  Left total knee arthroplasty.  IMPLANTS:  Stryker Triathlon knee with size 4 femur, size 5 tibial tray, 11 mm polyethylene insert, size 35 patellar button.  SURGEON:  Lind Guest. Ninfa Linden, M.D.  ASSISTANT:  Erskine Emery, PA-C  ANESTHESIA: 1. Adductor canal block, left leg. 2. General. 3. Exparel intra-articular injection.  ANTIBIOTICS:  2 g IV Ancef.  TOURNIQUET TIME:  Less than 1 hour.  BLOOD LOSS:  Less than 200 mL.  COMPLICATIONS:  None.  INDICATIONS:  Mr. Teale is a 69 year old gentleman that I have known for many years now.  He has had a right total knee arthroplasty by me in the past.  His left knee has been painful for about 3 or 4 years now.  I have provided numerous injections of that knee and had him on pain medicines and finally performed an arthroscopic intervention.  This showed significant arthritic changes.  This helped him minimally.  At this point, he wished to proceed with a total knee arthroplasty because his daily pain has decreased mobility and has decreased quality of life. He understands the risks of acute blood loss anemia, nerve and vessel injury, fracture, infection, and DVT.  He understands the goals are decreased pain, improved mobility, and overall improved quality of life.  PROCEDURE DESCRIPTION:  After informed consent was obtained, appropriate left leg was marked.  Anesthesia obtained with adductor canal block.   He was then brought to the operating room, placed supine on the operating table.  General anesthesia was then obtained.  A nonsterile tourniquet was placed around his upper left leg.  His left leg was prepped and draped from the thigh down the toes with DuraPrep and sterile drapes including sterile stockinette.  Time-out was called and he was identified as the correct patient, correct left leg.  I then used Esmarch to wrap out the leg and tourniquet was inflated to 300 mm of pressure.  I then made a midline incision over the patella and carried this proximally and distally.  I dissected down the knee joint and performed a medial parapatellar arthrotomy.  We drained fluid from the knee and then put the knee in a flexed position.  We  removed ACL, PCL, medial and lateral meniscus, as well as osteophytes from the knee. Using the extramedullary guide for making our tibial cut, we set our tibial cut referencing off the high side, taking 9 mm off correcting for varus and valgus and neutral slope.  We then made this cut without difficulty.  We moved those guide pins.  We went to the femur and using an intramedullary hole through the notch, we set our femoral cutting guide for the left knee 5 degrees  externally rotated.  I made a 10 mm distal femoral cut.  We brought the leg and knee back down in extension and with an 11 mm extension block, I was very pleased with his extension.  We went back to the femur and used the femoral sizing guide based off the epicondylar axis and Whitesides line.  We chose a size 4 femur.  Based on this, we put the 4:1 cutting block for a 4 femur and made our anterior and posterior cuts followed by our chamfer cuts.  We then made our femoral box cut based on a size 4.  We then went back to the tibia and set our rotation of the tibial tubercle.  We trialed for a size 5 tibia and we set the tibial plate on and then made our keel punch over this.  With the trial femur and  the trial tibia in place, we put an 11 mm trial polyethylene insert.  I was pleased with the stability and range of motion.  We then made a 10 mm patellar cut and for the undersurface of the patella and drilled 3 holes for patella button size 35.  We then removed all trial components.  I irrigated the knee with normal saline solution.  We infiltrated the knee with a mixture of 20 mL of Exparel with 40 mL of saline.  We then mixed our cement and cemented the real Stryker Triathlon size 5 tibial tray, followed by the size 4 femur.  We placed the real fix bearing size 11 mm polyethylene insert and cemented the patellar button.  We removed the cement debris from the knee.  Once the cement had hardened, we irrigated the knee again with normal saline solution.  The tourniquet was let down.  Hemostasis was obtained with electrocautery.  We then closed the arthrotomy with interrupted #1 Vicryl suture followed by 0 Vicryl in the deep tissue, 2- 0 Vicryl in subcutaneous tissue, 4-0 Monocryl subcuticular stitch and Steri-Strips on the skin.  Well-padded sterile dressing was applied.  He was awakened, extubated and taken to recovery room in stable condition. All final counts were correct.  There were no complications noted.  Of note, Erskine Emery, PA-C assisted me during the entire case and his assistance was crucial for facilitating all aspects of this case.     Lind Guest. Ninfa Linden, M.D.     CYB/MEDQ  D:  12/22/2014  T:  12/23/2014  Job:  [id number]

## 2014-12-23 NOTE — Progress Notes (Signed)
Utilization review completed.  

## 2014-12-23 NOTE — Evaluation (Signed)
Physical Therapy Evaluation Patient Details Name: Ryan Craig MRN: [id number] DOB: 09-Jun-1946 Today's Date: 12/23/2014   History of Present Illness  69 y.o. male admitted to [facility] on 12/22/14 for elective L TKA.  Pt with significant PMHx of HTN, chronic back pain, anemia, and R TKA.    Clinical Impression  Pt is POD #1 and is moving well, although limited by pain and nausea this AM.  He will likely progress well enough to go home with wife's assist and HHPT at discharge.   PT to follow acutely for deficits listed below.       Follow Up Recommendations Home health PT    Equipment Recommendations  None recommended by PT    Recommendations for Other Services   NA    Precautions / Restrictions Precautions Precautions: Knee Precaution Booklet Issued: Yes (comment) Precaution Comments: handout given, WBAT status and KI donning reviewed.  Required Braces or Orthoses: Knee Immobilizer - Left Knee Immobilizer - Left: Other (comment) (no order for, on when PT entered, used for 1st session. ) Restrictions LLE Weight Bearing: Weight bearing as tolerated      Mobility  Bed Mobility Overal bed mobility: Needs Assistance Bed Mobility: Supine to Sit     Supine to sit: Min assist     General bed mobility comments: Min assist to support left leg during transition to EOB.  Pt using bedrail to pull to sitting.   Transfers Overall transfer level: Needs assistance Equipment used: Rolling walker (2 wheeled) Transfers: Sit to/from Stand Sit to Stand: Min assist         General transfer comment: Min assist to support trunk during transitions.  Pt relying heavily on upper extremity support for control.  Verbal cues for safe hand placement and RW use.   Ambulation/Gait Ambulation/Gait assistance: Min guard Ambulation Distance (Feet): 8 Feet Assistive device: Rolling walker (2 wheeled) Gait Pattern/deviations: Step-to pattern;Antalgic     General Gait Details: Pt with moderately antalgic  gait pattern.  Verbal cues for correct LE sequencing and Min guard assist for safety due to abnormal gait pattern.          Balance Overall balance assessment: Needs assistance Sitting-balance support: Feet supported;No upper extremity supported Sitting balance-Leahy Scale: Good     Standing balance support: Bilateral upper extremity supported Standing balance-Leahy Scale: Poor                               Pertinent Vitals/Pain Pain Assessment: 0-10 Pain Score: 8  Pain Location: left knee Pain Descriptors / Indicators: Aching;Burning Pain Intervention(s): Limited activity within patient's tolerance;Monitored during session;Repositioned    Home Living Family/patient expects to be discharged to:: Private residence Living Arrangements: Spouse/significant other Available Help at Discharge: Family;Available 24 hours/day Type of Home: House Home Access: Stairs to enter Entrance Stairs-Rails: None Entrance Stairs-Number of Steps: 1 (1 step in the garage, 2 in the front) Home Layout: One level Home Equipment: Walker - 2 wheels;Crutches;Cane - single point;Bedside commode;Shower seat      Prior Function Level of Independence: Independent with assistive device(s)         Comments: uses the can at times     Hand Dominance   Dominant Hand: Right    Extremity/Trunk Assessment   Upper Extremity Assessment: Defer to OT evaluation           Lower Extremity Assessment: LLE deficits/detail   LLE Deficits / Details: left leg with normal post op  pain and weakness.  Ankle 3/5, knee 2/5, hip 2+/5  Cervical / Trunk Assessment: Other exceptions  Communication   Communication: No difficulties  Cognition Arousal/Alertness: Awake/alert Behavior During Therapy: WFL for tasks assessed/performed Overall Cognitive Status: Within Functional Limits for tasks assessed                         Exercises Total Joint Exercises Ankle Circles/Pumps: AROM;Both;20  reps;Supine Quad Sets: AROM;Left;10 reps;Supine Towel Squeeze: AROM;Both;10 reps;Supine Heel Slides: AAROM;Left;10 reps;Supine      Assessment/Plan    PT Assessment Patient needs continued PT services  PT Diagnosis Difficulty walking;Generalized weakness;Abnormality of gait;Acute pain   PT Problem List Decreased strength;Decreased range of motion;Decreased activity tolerance;Decreased balance;Decreased mobility;Decreased knowledge of use of DME;Decreased knowledge of precautions;Pain  PT Treatment Interventions DME instruction;Gait training;Functional mobility training;Stair training;Therapeutic activities;Therapeutic exercise;Balance training;Neuromuscular re-education;Patient/family education;Manual techniques;Modalities   PT Goals (Current goals can be found in the Care Plan section) Acute Rehab PT Goals Patient Stated Goal: to go home at discharge PT Goal Formulation: With patient/family Time For Goal Achievement: 12/30/14 Potential to Achieve Goals: Good    Frequency 7X/week           End of Session Equipment Utilized During Treatment: Left knee immobilizer Activity Tolerance: Patient limited by fatigue;Patient limited by pain (by nausea) Patient left: in chair;with call bell/phone within reach;with family/visitor present Nurse Communication: Mobility status;Other (comment) (IV beeping)         Time: 3817-7116 PT Time Calculation (min) (ACUTE ONLY): 31 min   Charges:   PT Evaluation $Initial PT Evaluation Tier I: 1 Procedure PT Treatments $Therapeutic Activity: 8-22 mins        Donovon Micheletti B. [place], [place], DPT [phone]   12/23/2014, 12:11 PM

## 2014-12-23 NOTE — Progress Notes (Signed)
Subjective: 1 Day Post-Op Procedure(s) (LRB): LEFT TOTAL KNEE ARTHROPLASTY (Left) Patient reports pain as moderate.    Objective: Vital signs in last 24 hours: Temp:  [97 F (36.1 C)-98.7 F (37.1 C)] 98.1 F (36.7 C) (03/02 0521) Pulse Rate:  [63-93] 81 (03/02 0521) Resp:  [9-20] 15 (03/02 0521) BP: (112-149)/(61-82) 132/69 mmHg (03/02 0521) SpO2:  [94 %-100 %] 100 % (03/02 0521) Weight:  [98.113 kg (216 lb 4.8 oz)] 98.113 kg (216 lb 4.8 oz) (03/01 0953)  Intake/Output from previous day: 03/01 0701 - 03/02 0700 In: 3763.8 [P.O.:500; I.V.:3213.8; IV Piggyback:50] Out: 1925 [CVKFM:4037] Intake/Output this shift:     Recent Labs  12/23/14 0650  HGB 12.2*    Recent Labs  12/23/14 0650  WBC 13.2*  RBC 4.23  HCT 37.0*  PLT 274   No results for input(s): NA, K, CL, CO2, BUN, CREATININE, GLUCOSE, CALCIUM in the last 72 hours. No results for input(s): LABPT, INR in the last 72 hours.  Sensation intact distally Intact pulses distally Dorsiflexion/Plantar flexion intact Incision: dressing C/D/I  Assessment/Plan: 1 Day Post-Op Procedure(s) (LRB): LEFT TOTAL KNEE ARTHROPLASTY (Left) Up with therapy  Kyleena Scheirer Y 12/23/2014, 7:40 AM

## 2014-12-23 NOTE — Discharge Instructions (Signed)
Information on my medicine - XARELTO (Rivaroxaban)  This medication education was reviewed with me or my healthcare representative as part of my discharge preparation.  The pharmacist that spoke with me during my hospital stay was:  Sindy Guadeloupe, [facility]  Why was Xarelto prescribed for you? Xarelto was prescribed for you to reduce the risk of blood clots forming after orthopedic surgery. The medical term for these abnormal blood clots is venous thromboembolism (VTE).  What do you need to know about xarelto ? Take your Xarelto ONCE DAILY at the same time every day. You may take it either with or without food.  If you have difficulty swallowing the tablet whole, you may crush it and mix in applesauce just prior to taking your dose.  Take Xarelto exactly as prescribed by your doctor and DO NOT stop taking Xarelto without talking to the doctor who prescribed the medication.  Stopping without other VTE prevention medication to take the place of Xarelto may increase your risk of developing a clot.  After discharge, you should have regular check-up appointments with your healthcare provider that is prescribing your Xarelto.    What do you do if you miss a dose? If you miss a dose, take it as soon as you remember on the same day then continue your regularly scheduled once daily regimen the next day. Do not take two doses of Xarelto on the same day.   Important Safety Information A possible side effect of Xarelto is bleeding. You should call your healthcare provider right away if you experience any of the following: ? Bleeding from an injury or your nose that does not stop. ? Unusual colored urine (red or dark brown) or unusual colored stools (red or black). ? Unusual bruising for unknown reasons. ? A serious fall or if you hit your head (even if there is no bleeding).  Some medicines may interact with Xarelto and might increase your risk of bleeding while on Xarelto. To help avoid  this, consult your healthcare provider or pharmacist prior to using any new prescription or non-prescription medications, including herbals, vitamins, non-steroidal anti-inflammatory drugs (NSAIDs) and supplements.  This website has more information on Xarelto: [email or web address].[email or web address]

## 2014-12-23 NOTE — Plan of Care (Signed)
Problem: Consults Goal: Diagnosis- Total Joint Replacement Primary Total Knee     

## 2014-12-24 LAB — CBC
HCT: 33.7 % — ABNORMAL LOW (ref 39.0–52.0)
Hemoglobin: 11.3 g/dL — ABNORMAL LOW (ref 13.0–17.0)
MCH: 28.7 pg (ref 26.0–34.0)
MCHC: 33.5 g/dL (ref 30.0–36.0)
MCV: 85.5 fL (ref 78.0–100.0)
Platelets: 275 10*3/uL (ref 150–400)
RBC: 3.94 MIL/uL — ABNORMAL LOW (ref 4.22–5.81)
RDW: 12.6 % (ref 11.5–15.5)
WBC: 15.9 10*3/uL — ABNORMAL HIGH (ref 4.0–10.5)

## 2014-12-24 MED ORDER — OXYCODONE-ACETAMINOPHEN 5-325 MG PO TABS: 1.0000 | ORAL_TABLET | ORAL | Status: DC | PRN

## 2014-12-24 MED ORDER — CYCLOBENZAPRINE HCL 10 MG PO TABS: 10.0000 mg | ORAL_TABLET | Freq: Three times a day (TID) | ORAL | Status: DC | PRN

## 2014-12-24 MED ORDER — RIVAROXABAN 10 MG PO TABS: 10.0000 mg | ORAL_TABLET | Freq: Every day | ORAL | Status: DC

## 2014-12-24 NOTE — Progress Notes (Signed)
Subjective: 2 Days Post-Op Procedure(s) (LRB): LEFT TOTAL KNEE ARTHROPLASTY (Left) Patient reports pain as moderate.  No complaints. Voiding well positive flatus. Diet liquids thus far to advance today.  Objective: Vital signs in last 24 hours: Temp:  [98.7 F (37.1 C)-100.3 F (37.9 C)] 98.7 F (37.1 C) (03/03 0507) Pulse Rate:  [90-103] 103 (03/03 0507) Resp:  [14-19] 17 (03/03 0800) BP: (130-140)/(68-77) 130/77 mmHg (03/03 0507) SpO2:  [93 %-95 %] 93 % (03/03 0800)  Intake/Output from previous day: 03/02 0701 - 03/03 0700 In: 990 [P.O.:240; I.V.:750] Out: 1800 [Urine:1700; Emesis/NG output:100] Intake/Output this shift: Total I/O In: 240 [P.O.:240] Out: -    Recent Labs  12/23/14 0650 12/24/14 0558  HGB 12.2* 11.3*    Recent Labs  12/23/14 0650 12/24/14 0558  WBC 13.2* 15.9*  RBC 4.23 3.94*  HCT 37.0* 33.7*  PLT 274 275    Recent Labs  12/23/14 0650  NA 136  K 4.4  CL 104  CO2 22  BUN 10  CREATININE 0.85  GLUCOSE 136*  CALCIUM 9.0   No results for input(s): LABPT, INR in the last 72 hours.  Left leg: Sensation intact distally Intact pulses distally Dorsiflexion/Plantar flexion intact Incision: dressing C/D/I Compartment soft  Assessment/Plan: 2 Days Post-Op Procedure(s) (LRB): LEFT TOTAL KNEE ARTHROPLASTY (Left) Up with therapy  Encourage incentive spirometry Monitor WBC, temp. Leukocytosis most likely due to de margination  Probable discharge to home tomorrow.  [place], [place] 12/24/2014, 10:01 AM

## 2014-12-24 NOTE — Progress Notes (Signed)
Physical Therapy Treatment Patient Details Name: Ryan Craig MRN: [id number] DOB: February 11, 1946 Today's Date: 12/24/2014    History of Present Illness 69 y.o. male admitted to [facility] on 12/22/14 for elective L TKA.  Pt with significant PMHx of HTN, chronic back pain, anemia, and R TKA.      PT Comments    Pt was able to ambulate this afternoon w/o L knee immobilizer.  It is unclear if pt's hesitancy with ambulating farther and performing more exercises is due to unwillingness, pain, or fatigue; pt is a poor historian.  Pt will benefit from further therapy to increase independence w/ ambulatory activities.  Pt is anticipated to d/c tomorrow w/ HHPT.   Follow Up Recommendations  Home health PT     Equipment Recommendations  None recommended by PT    Recommendations for Other Services       Precautions / Restrictions Precautions Precautions: Knee Required Braces or Orthoses:  (pt did not wear KI while ambulating today) Knee Immobilizer - Left: On when out of bed or walking Restrictions Weight Bearing Restrictions: Yes LLE Weight Bearing: Weight bearing as tolerated    Mobility  Bed Mobility Overal bed mobility: Needs Assistance Bed Mobility: Supine to Sit Rolling: Min assist (with bringing LLE off of bed)            Transfers Overall transfer level: Needs assistance Equipment used: Rolling walker (2 wheeled) Transfers: Sit to/from Stand Sit to Stand: Min guard         General transfer comment: Cues for correct hand placement on walker, pt continues to want to push of center rail  Ambulation/Gait Ambulation/Gait assistance: Min guard Ambulation Distance (Feet): 60 Feet Assistive device: Rolling walker (2 wheeled) Gait Pattern/deviations: Step-through pattern;Antalgic;Decreased step length - left Gait velocity: decreased Gait velocity interpretation: Below normal speed for age/gender General Gait Details: verbal cues to straighten trunk and to keep hands on handrests  of RW instead of flexing trunk to rest.  Required several standing rest breaks 2/2 pain and fatigue of LLE.   Stairs            Wheelchair Mobility    Modified Rankin (Stroke Patients Only)       Balance Overall balance assessment: Needs assistance Sitting-balance support: Feet supported;Bilateral upper extremity supported Sitting balance-Leahy Scale: Good     Standing balance support: Bilateral upper extremity supported Standing balance-Leahy Scale: Fair                      Cognition Arousal/Alertness: Awake/alert Behavior During Therapy: WFL for tasks assessed/performed Overall Cognitive Status: Within Functional Limits for tasks assessed                      Exercises Total Joint Exercises Ankle Circles/Pumps: AROM;Both;20 reps;Seated Towel Squeeze: AROM;Both;5 reps;Seated (5 second holds) Heel Slides: AAROM;Seated;10 reps;Left Straight Leg Raises: 5 reps;AAROM;Seated;Left Knee Flexion: Seated;AROM;5 reps;Left    General Comments General comments (skin integrity, edema, etc.): It is unclear if pt's hesitancy w/ exercises and walking is 2/2 fear, pain, or fatigue, pt is a poor historian       Pertinent Vitals/Pain Pain Assessment: 0-10 Pain Score: 5  Pain Location: left knee Pain Descriptors / Indicators: Aching;Discomfort;Grimacing;Moaning Pain Intervention(s): Limited activity within patient's tolerance;Monitored during session;Premedicated before session;Repositioned;Ice applied    Home Living                      Prior Function  PT Goals (current goals can now be found in the care plan section) Progress towards PT goals: Progressing toward goals    Frequency  7X/week    PT Plan Current plan remains appropriate    Co-evaluation             End of Session Equipment Utilized During Treatment: Gait belt Activity Tolerance: Patient limited by fatigue;Patient limited by pain Patient left: in chair;with  call bell/phone within reach;with family/visitor present     Time: 1430-1510 PT Time Calculation (min) (ACUTE ONLY): 40 min  Charges:  $Gait Training: 23-37 mins $Therapeutic Exercise: 8-22 mins                    G CodesJoslyn Hy PT, [place] [phone] 12/24/2014, 4:02 PM

## 2014-12-24 NOTE — Progress Notes (Signed)
Physical Therapy Treatment Patient Details Name: Ryan Craig MRN: [id number] DOB: [birth year] Today's Date: 12/24/2014    History of Present Illness 69 y.o. male admitted to [facility] on 12/22/14 for elective L TKA.  Pt with significant PMHx of HTN, chronic back pain, anemia, and R TKA.      PT Comments    Pt was able to tolerate ambulatory (120 ft) and B TKA exercises well today.  Pt limited his ambulatory distance this morning as he knew he would be going home soon and he wanted to save his energy.  Pt is anticipating d/c home w/ HHPT today.    Follow Up Recommendations  Home health PT     Equipment Recommendations  None recommended by PT    Recommendations for Other Services       Precautions / Restrictions Precautions Precautions: Knee Precaution Comments: reviewed no pillow under operative knee.   Required Braces or Orthoses: Knee Immobilizer - Left Knee Immobilizer - Left: Other (comment) (used 2/2 pt's complaint of pain and status from PT yesterday) Restrictions Weight Bearing Restrictions: Yes LLE Weight Bearing: Weight bearing as tolerated    Mobility  Bed Mobility Overal bed mobility: Needs Assistance Bed Mobility: Sit to Supine Rolling: Min assist Sidelying to sit: Min assist   Sit to supine: Min assist (required assistance w/ bring L leg onto bed)   General bed mobility comments: Pt demonstrated grimacing and moaning during bed mobility, "Just help me"  Transfers Overall transfer level: Needs assistance Equipment used: Rolling walker (2 wheeled) Transfers: Sit to/from Stand Sit to Stand: Min assist         General transfer comment: Cues required for hand placement and overall safety  Ambulation/Gait Ambulation/Gait assistance: Min guard Ambulation Distance (Feet): 50 Feet Assistive device: Rolling walker (2 wheeled) Gait Pattern/deviations: Antalgic;Step-through pattern;Decreased step length - left;Trunk flexed Gait velocity: decreased Gait velocity  interpretation: Below normal speed for age/gender General Gait Details: verbal cues to straighten trunk and to keep hands on handrests of RW instead of flexing trunk to rest.  Required several standing rest breaks 2/2 pain and fatigue of LLE.   Stairs Stairs: Yes Stairs assistance: Min assist Stair Management: No rails;Step to pattern;Forwards;With walker (w/ walker during ascent/descent and hand held assist at top ) Number of Stairs: 1 General stair comments: Pt was hesitant to use RW for support.  Wife assisted by holding RW in place.  Verbal cues to stand upright.  Wheelchair Mobility    Modified Rankin (Stroke Patients Only)       Balance Overall balance assessment: Needs assistance Sitting-balance support: Feet supported;Single extremity supported Sitting balance-Leahy Scale: Good     Standing balance support: Bilateral upper extremity supported Standing balance-Leahy Scale: Fair                      Cognition Arousal/Alertness: Awake/alert Behavior During Therapy: WFL for tasks assessed/performed Overall Cognitive Status: Within Functional Limits for tasks assessed                      Exercises Total Joint Exercises Heel Slides: AAROM;Supine;10 reps;Left Knee Flexion: AROM;AAROM;Left;5 reps;Seated Goniometric ROM: 80 deg Flexion    General Comments General comments (skin integrity, edema, etc.): Pt became nauseous after ambulating, likely 2/2 pain medication and lack of eating (per wife), pt was encouraged to eat lunch so he feels better in the afternoon      Pertinent Vitals/Pain Pain Assessment: 0-10 Pain Score: 8  Pain Location: left  knee Pain Descriptors / Indicators: Aching;Constant;Discomfort;Grimacing;Guarding;Moaning Pain Intervention(s): Limited activity within patient's tolerance;Monitored during session;Premedicated before session;Repositioned;RN gave pain meds during session    [place] expects to be discharged  to:: Private residence Living Arrangements: Spouse/significant other Available Help at Discharge: Family;Available 24 hours/day Type of Home: House Home Access: Stairs to enter Entrance Stairs-Rails: None Home Layout: One level Home Equipment: Walker - 2 wheels;Crutches;Cane - single point;Bedside commode;Shower seat - built in      Prior Function Level of Independence: Independent with assistive device(s)      Comments: uses the can at times   PT Goals (current goals can now be found in the care plan section) Acute Rehab PT Goals Patient Stated Goal: go home tomorrow Progress towards PT goals: Progressing toward goals (moving slowly w/ L TKA exercises but otherwise, progressing )    Frequency  7X/week    PT Plan Current plan remains appropriate    Co-evaluation             End of Session Equipment Utilized During Treatment: Left knee immobilizer;Gait belt Activity Tolerance: Patient limited by fatigue;Patient limited by pain (Pt expresses 8/10 pain in L knee despite premedication) Patient left: in bed;with SCD's reapplied;with family/visitor present     Time: 1026-1100 PT Time Calculation (min) (ACUTE ONLY): 34 min  Charges:  $Gait Training: 8-22 mins $Therapeutic Exercise: 8-22 mins                    G CodesJoslyn Hy PT, [place] [phone] 12/24/2014, 11:41 AM

## 2014-12-24 NOTE — Progress Notes (Signed)
Patient has refused to wear the knee immobilizer through my shift. Stressed the importance of him wearing it. He promised he would wear it later today. Also spoke with the patient's wife about the importance of wearing the brace. She has been encouraging him to wear the brace as well

## 2014-12-24 NOTE — Evaluation (Signed)
Occupational Therapy Evaluation Patient Details Name: Ryan Craig MRN: [id number] DOB: Oct 07, 1946 Today's Date: 12/24/2014    History of Present Illness 69 y.o. male admitted to [facility] on 12/22/14 for elective L TKA.  Pt with significant PMHx of HTN, chronic back pain, anemia, and R TKA.     Clinical Impression   Patient independent> mod I PTA. Patient currently requires up to total assist for LB ADLs and min assist for functional mobility. Patient will benefit from acute OT to increase overall independence in the areas of ADLs, functional mobility, and overall safety in order to safely discharge home with wife.     Follow Up Recommendations  No OT follow up;Supervision/Assistance - 24 hour    Equipment Recommendations  Other (comment) (AE - reacher, sock aid, LH sponge, LH shoe horn)    Recommendations for Other Services  None at this time     Precautions / Restrictions Precautions Precautions: Knee Precaution Comments: reviewed no pillow under operative knee.   Required Braces or Orthoses: Knee Immobilizer - Left Restrictions Weight Bearing Restrictions: Yes LLE Weight Bearing: Weight bearing as tolerated      Mobility Bed Mobility Overal bed mobility: Needs Assistance Bed Mobility: Rolling;Sidelying to Sit Rolling: Min assist Sidelying to sit: Min assist       General bed mobility comments: Wife assisted with LLE management ("I want to get practice before I take him home"). Patient required max encouragement to engage in bed mobiliy and get OOB.   Transfers Overall transfer level: Needs assistance Equipment used: Rolling walker (2 wheeled) Transfers: Sit to/from Stand Sit to Stand: Min assist         General transfer comment: Cues required for hand placement and overall safety    Balance Overall balance assessment: Needs assistance Sitting-balance support: Feet supported;No upper extremity supported Sitting balance-Leahy Scale: Good     Standing balance  support: Bilateral upper extremity supported;During functional activity Standing balance-Leahy Scale: Fair    ADL Overall ADL's : Needs assistance/impaired Eating/Feeding: Independent;Sitting   Grooming: Set up;Sitting   Upper Body Bathing: Set up;Sitting   Lower Body Bathing: Total assistance;Sit to/from stand   Upper Body Dressing : Set up;Sitting   Lower Body Dressing: Total assistance;Sit to/from stand   Toilet Transfer: Minimal assistance;RW;Ambulation;BSC General ADL Comments: Patient with increased pain which limited his ability to participate in therapy. Wife reports patient has a "low tolerance for pain". Patient unable to reach BLEs for LB ADLs. Educated pt and wife on AE and plan to introduce and demonstrate their usage during next OT session. Also plan to educate and demonstrate walk-in shower transfers using shower seat.     Pertinent Vitals/Pain Pain Assessment: 0-10 Pain Score: 8  Pain Location: left knee Pain Descriptors / Indicators: Aching;Burning Pain Intervention(s): Monitored during session;Limited activity within patient's tolerance;Repositioned     Hand Dominance Right   Extremity/Trunk Assessment Upper Extremity Assessment Upper Extremity Assessment: Overall WFL for tasks assessed   Lower Extremity Assessment Lower Extremity Assessment: Defer to PT evaluation       Communication Communication Communication: No difficulties   Cognition Arousal/Alertness: Awake/alert Behavior During Therapy: WFL for tasks assessed/performed Overall Cognitive Status: Within Functional Limits for tasks assessed              Home Living Family/patient expects to be discharged to:: Private residence Living Arrangements: Spouse/significant other Available Help at Discharge: Family;Available 24 hours/day Type of Home: House Home Access: Stairs to enter [employer] of Steps: 1 Entrance Stairs-Rails: None Home Layout:  One level     Bathroom  Shower/Tub: Walk-in shower;Door   Bathroom Toilet: Standard     Home Equipment: Environmental consultant - 2 wheels;Crutches;Cane - single point;Bedside commode;Shower seat - built in          Prior Functioning/Environment Level of Independence: Independent with assistive device(s)        Comments: uses the can at times    OT Diagnosis: Generalized weakness;Acute pain   OT Problem List: Decreased strength;Decreased range of motion;Decreased activity tolerance;Impaired balance (sitting and/or standing);Decreased safety awareness;Decreased knowledge of use of DME or AE;Decreased knowledge of precautions;Pain   OT Treatment/Interventions: Self-care/ADL training;Therapeutic exercise;Energy conservation;DME and/or AE instruction;Balance training;Patient/family education;Therapeutic activities    OT Goals(Current goals can be found in the care plan section) Acute Rehab OT Goals Patient Stated Goal: go home tomorrow OT Goal Formulation: With patient/family Time For Goal Achievement: 12/31/14 Potential to Achieve Goals: Good ADL Goals Pt Will Perform Grooming: with modified independence;standing Pt Will Perform Upper Body Bathing: Independently;sitting Pt Will Perform Lower Body Bathing: with modified independence;sit to/from stand;with adaptive equipment Pt Will Perform Upper Body Dressing: Independently;sitting Pt Will Perform Lower Body Dressing: with modified independence;sit to/from stand;with adaptive equipment Pt Will Transfer to Toilet: with modified independence;ambulating;bedside commode Pt Will Perform Toileting - Clothing Manipulation and hygiene: with modified independence;sit to/from stand Pt Will Perform Tub/Shower Transfer: with modified independence;3 in 1;rolling walker;ambulating;Shower transfer Additional ADL Goal #1: Patient will independently adhere and verbalize knee precautions 100% of the time  OT Frequency: Min 2X/week   Barriers to D/C: None known at this time           End of Session Equipment Utilized During Treatment: Rolling walker;Left knee immobilizer CPM Left Knee CPM Left Knee: Off  Activity Tolerance: Patient limited by pain Patient left: in chair;with call bell/phone within reach;with family/visitor present   Time: 2482-5003 OT Time Calculation (min): 23 min Charges:  OT General Charges $OT Visit: 1 Procedure OT Evaluation $Initial OT Evaluation Tier I: 1 Procedure OT Treatments $Self Care/Home Management : 8-22 mins  Lasheba Stevens , MS, OTR/L, CLT Pager: [phone]  12/24/2014, 9:03 AM

## 2014-12-24 NOTE — Progress Notes (Signed)
Physical Therapy Treatment Patient Details Name: Ryan Craig MRN: [id number] DOB: 07/25/1946 Today's Date: 12/24/2014    History of Present Illness 69 y.o. male admitted to [facility] on 12/22/14 for elective L TKA.  Pt with significant PMHx of HTN, chronic back pain, anemia, and R TKA.      PT Comments    Pt's session was limited by pt's report of 8/10 pain despite premedication.  Pt ambulated 50 ft and ascend/descend 1 stair.  Attempt more exercises this afternoon as pt was limited by pain this morning.  Follow Up Recommendations  Home health PT     Equipment Recommendations  None recommended by PT    Recommendations for Other Services       Precautions / Restrictions Precautions Precautions: Knee Precaution Comments: reviewed no pillow under operative knee.   Required Braces or Orthoses: Knee Immobilizer - Left Knee Immobilizer - Left: Other (comment) (used 2/2 pt's complaint of pain and status from PT yesterday) Restrictions Weight Bearing Restrictions: Yes LLE Weight Bearing: Weight bearing as tolerated    Mobility  Bed Mobility Overal bed mobility: Needs Assistance Bed Mobility: Sit to Supine Rolling: Min assist Sidelying to sit: Min assist   Sit to supine: Min assist (required assistance w/ bring L leg onto bed)   General bed mobility comments: Pt demonstrated grimacing and moaning during bed mobility, "Just help me"  Transfers Overall transfer level: Needs assistance Equipment used: Rolling walker (2 wheeled) Transfers: Sit to/from Stand Sit to Stand: Min assist         General transfer comment: Cues required for hand placement and overall safety  Ambulation/Gait Ambulation/Gait assistance: Min guard Ambulation Distance (Feet): 50 Feet Assistive device: Rolling walker (2 wheeled) Gait Pattern/deviations: Antalgic;Step-through pattern;Decreased step length - left;Trunk flexed Gait velocity: decreased Gait velocity interpretation: Below normal speed for  age/gender General Gait Details: verbal cues to straighten trunk and to keep hands on handrests of RW instead of flexing trunk to rest.  Required several standing rest breaks 2/2 pain and fatigue of LLE.   Stairs Stairs: Yes Stairs assistance: Min assist Stair Management: No rails;Step to pattern;Forwards;With walker (w/ walker during ascent/descent and hand held assist at top ) Number of Stairs: 1 General stair comments: Pt was hesitant to use RW for support.  Wife assisted by holding RW in place.  Verbal cues to stand upright.  Wheelchair Mobility    Modified Rankin (Stroke Patients Only)       Balance Overall balance assessment: Needs assistance Sitting-balance support: Feet supported;Single extremity supported Sitting balance-Leahy Scale: Good     Standing balance support: Bilateral upper extremity supported Standing balance-Leahy Scale: Fair                      Cognition Arousal/Alertness: Awake/alert Behavior During Therapy: WFL for tasks assessed/performed Overall Cognitive Status: Within Functional Limits for tasks assessed                      Exercises Total Joint Exercises Heel Slides: AAROM;Supine;10 reps;Left Knee Flexion: AROM;AAROM;Left;5 reps;Seated Goniometric ROM: 80 deg Flexion    General Comments General comments (skin integrity, edema, etc.): Pt became nauseous after ambulating, likely 2/2 pain medication and lack of eating (per wife), pt was encouraged to eat lunch so he feels better in the afternoon      Pertinent Vitals/Pain Pain Assessment: 0-10 Pain Score: 8  Pain Location: left knee Pain Descriptors / Indicators: Aching;Constant;Discomfort;Grimacing;Guarding;Moaning Pain Intervention(s): Limited activity within patient's tolerance;Monitored during  session;Premedicated before session;Repositioned;RN gave pain meds during session    [place] expects to be discharged to:: Private residence Living  Arrangements: Spouse/significant other Available Help at Discharge: Family;Available 24 hours/day Type of Home: House Home Access: Stairs to enter Entrance Stairs-Rails: None Home Layout: One level Home Equipment: Walker - 2 wheels;Crutches;Cane - single point;Bedside commode;Shower seat - built in      Prior Function Level of Independence: Independent with assistive device(s)      Comments: uses the can at times   PT Goals (current goals can now be found in the care plan section) Acute Rehab PT Goals Patient Stated Goal: go home tomorrow Progress towards PT goals: Progressing toward goals (moving slowly w/ L TKA exercises but otherwise, progressing )    Frequency  7X/week    PT Plan Current plan remains appropriate    Co-evaluation             End of Session Equipment Utilized During Treatment: Left knee immobilizer;Gait belt Activity Tolerance: Patient limited by fatigue;Patient limited by pain (Pt expresses 8/10 pain in L knee despite premedication) Patient left: in bed;with SCD's reapplied;with family/visitor present     Time: 1026-1100 PT Time Calculation (min) (ACUTE ONLY): 34 min  Charges:  $Gait Training: 8-22 mins $Therapeutic Exercise: 8-22 mins                    G CodesJoslyn Hy PT, [place] [phone] 12/24/2014, 11:26 AM

## 2014-12-25 LAB — CBC
HCT: 30.3 % — ABNORMAL LOW (ref 39.0–52.0)
Hemoglobin: 10.1 g/dL — ABNORMAL LOW (ref 13.0–17.0)
MCH: 29 pg (ref 26.0–34.0)
MCHC: 33.3 g/dL (ref 30.0–36.0)
MCV: 87.1 fL (ref 78.0–100.0)
Platelets: 247 10*3/uL (ref 150–400)
RBC: 3.48 MIL/uL — ABNORMAL LOW (ref 4.22–5.81)
RDW: 12.9 % (ref 11.5–15.5)
WBC: 14.1 10*3/uL — ABNORMAL HIGH (ref 4.0–10.5)

## 2014-12-25 NOTE — Progress Notes (Signed)
Occupational Therapy Treatment Patient Details Name: Ryan Craig MRN: [id number] DOB: [birth year] Today's Date: 12/25/2014    History of present illness 69 y.o. male admitted to [facility] on 12/22/14 for elective L TKA.  Pt with significant PMHx of HTN, chronic back pain, anemia, and R TKA.     OT comments  Patient progressing towards OT goals, continue plan of care for now. Patient's wife present during session and supportive, stating she will be available to assist 24/7. Discharge plan changed > HHOT secondary to patient's increased need of assistance with tasks due to decreased strength and increased pain.    Follow Up Recommendations  Home health OT;Supervision/Assistance - 24 hour    Equipment Recommendations  Other (comment) (AE - reacher, sock aid, LH shoe horn, LH sponge)    Recommendations for Other Services  None at this time    Precautions / Restrictions Precautions Precautions: Knee Precaution Comments: reviewed no pillow under operative knee.   Restrictions Weight Bearing Restrictions: Yes LLE Weight Bearing: Weight bearing as tolerated     Mobility Bed Mobility Overal bed mobility: Needs Assistance Bed Mobility: Rolling;Sidelying to Sit Rolling: Min assist Sidelying to sit: Min assist       General bed mobility comments: Min assist needed for LLE management and cues needed for technique and overall safety  Transfers Overall transfer level: Needs assistance Equipment used: Rolling walker (2 wheeled) Transfers: Sit to/from Stand Sit to Stand: Min guard General transfer comment: Cues needed for correct hand placement and overall safety     Balance Overall balance assessment: Needs assistance Sitting-balance support: No upper extremity supported;Feet supported Sitting balance-Leahy Scale: Good     Standing balance support: Bilateral upper extremity supported;During functional activity Standing balance-Leahy Scale: Poor    ADL   Tub/ Shower Transfer: Moderate  assistance;3 in 1;Ambulation;Rolling walker;Walk-in shower     General ADL Comments: Demonstrated and had patient teach back use of AE (reacher, sock aid, LH shoe horn, LH sponge). Patient then performed simulated walk-in shower using RW and BSC. Wife present and states she can assist at home. Patient requires up to mod assist for shower stall transfer. Believe patient will benefit from [facility] to increase overall independence with BADLs and IADLs and improve overall safety in these areas.      Cognition   Behavior During Therapy: WFL for tasks assessed/performed Overall Cognitive Status: Within Functional Limits for tasks assessed                Pertinent Vitals/ Pain       Pain Assessment: 0-10 Pain Score: 8  Pain Location: lef knee Pain Descriptors / Indicators: Aching;Discomfort;Grimacing;Sore Pain Intervention(s): Monitored during session;Repositioned         Frequency Min 2X/week     Progress Toward Goals  OT Goals(current goals can now be found in the care plan section)  Progress towards OT goals: Progressing toward goals     Plan Discharge plan needs to be updated       End of Session Equipment Utilized During Treatment: Rolling walker CPM Left Knee CPM Left Knee: On Left Knee Flexion (Degrees): 50 Left Knee Extension (Degrees): 0   Activity Tolerance Patient tolerated treatment well   Patient Left in chair;with call bell/phone within reach;with family/visitor present     Time: 4967-5916 OT Time Calculation (min): 26 min  Charges: OT General Charges $OT Visit: 1 Procedure OT Treatments $Self Care/Home Management : 23-37 mins  Elana Jian , MS, OTR/L, CLT Pager: [phone]  12/25/2014, 9:01 AM

## 2014-12-25 NOTE — Progress Notes (Signed)
Physical Therapy Treatment Patient Details Name: Ryan Craig MRN: [id number] DOB: 1946/03/07 Today's Date: 12/25/2014    History of Present Illness 69 y.o. male admitted to [facility] on 12/22/14 for elective L TKA.  Pt with significant PMHx of HTN, chronic back pain, anemia, and R TKA.      PT Comments    Pt demonstrated improvement in pain tolerance today and was able to ambulate farther (90 ft) this session.  PT emphasized to pt the importance of allowing his L knee to bend during sit<>stand as this remains one of pt's greatest deficits.  Pt is anticipating d/c this morning.   Follow Up Recommendations  Home health PT     Equipment Recommendations  None recommended by PT    Recommendations for Other Services       Precautions / Restrictions Precautions Precautions: Knee Precaution Comments: reviewed no pillow under operative knee.   Restrictions Weight Bearing Restrictions: Yes LLE Weight Bearing: Weight bearing as tolerated    Mobility  Bed Mobility Overal bed mobility: Needs Assistance Bed Mobility: Rolling;Sidelying to Sit Rolling: Min assist Sidelying to sit: Min assist       General bed mobility comments: Min assist needed for LLE management and cues needed for technique and overall safety  Transfers Overall transfer level: Needs assistance Equipment used: Rolling walker (2 wheeled) Transfers: Sit to/from Stand Sit to Stand: Min guard         General transfer comment: Several verbal cues to allow L knee to bend some during sit<>stand; pt is reluctant to do so 2/2 pain, emphasized importance of working toward goal of 90 deg F of L knee  Ambulation/Gait Ambulation/Gait assistance: Min guard Ambulation Distance (Feet): 90 Feet Assistive device: Rolling walker (2 wheeled) Gait Pattern/deviations: Step-through pattern;Decreased step length - right;Decreased step length - left;Antalgic;Trunk flexed Gait velocity: decreased Gait velocity interpretation: Below  normal speed for age/gender General Gait Details: verbal cues to straighten trunk   Stairs            Wheelchair Mobility    Modified Rankin (Stroke Patients Only)       Balance Overall balance assessment: Needs assistance Sitting-balance support: No upper extremity supported;Feet supported Sitting balance-Leahy Scale: Good     Standing balance support: No upper extremity supported (while standing) Standing balance-Leahy Scale: Fair                      Cognition Arousal/Alertness: Awake/alert Behavior During Therapy: WFL for tasks assessed/performed Overall Cognitive Status: Within Functional Limits for tasks assessed                      Exercises Total Joint Exercises Ankle Circles/Pumps: AROM;Both;20 reps;Seated Towel Squeeze: AROM;Left;10 reps;Seated (5 second holds) Heel Slides: AAROM;Seated;10 reps;Left Straight Leg Raises: 5 reps;AAROM;Seated;Left Knee Flexion: AROM;AAROM;Left;5 reps;Seated Goniometric ROM: 8-70    General Comments        Pertinent Vitals/Pain Pain Assessment: 0-10 Pain Score: 6  Pain Location: L knee Pain Descriptors / Indicators: Aching;Grimacing;Discomfort Pain Intervention(s): Limited activity within patient's tolerance;Monitored during session;Repositioned    Home Living                      Prior Function            PT Goals (current goals can now be found in the care plan section) Acute Rehab PT Goals Patient Stated Goal: go home today Progress towards PT goals: Progressing toward goals    Frequency  7X/week    PT Plan Current plan remains appropriate    Co-evaluation             End of Session Equipment Utilized During Treatment: Gait belt Activity Tolerance: Patient limited by fatigue;Patient limited by pain Patient left: in chair;with call bell/phone within reach;with family/visitor present     Time: 4709-6283 PT Time Calculation (min) (ACUTE ONLY): 33 min  Charges:   $Gait Training: 8-22 mins $Therapeutic Exercise: 8-22 mins                    G CodesJoslyn Craig PT, [place] [phone] 12/25/2014, 10:38 AM

## 2014-12-25 NOTE — Progress Notes (Signed)
Subjective: 3 Days Post-Op Procedure(s) (LRB): LEFT TOTAL KNEE ARTHROPLASTY (Left) Patient reports pain as moderate.    Objective: Vital signs in last 24 hours: Temp:  [99.2 F (37.3 C)-100.8 F (38.2 C)] 99.2 F (37.3 C) (03/04 0539) Pulse Rate:  [100-114] 100 (03/04 0539) Resp:  [15-18] 15 (03/04 0539) BP: (101-118)/(48-67) 105/48 mmHg (03/04 0539) SpO2:  [92 %-94 %] 93 % (03/04 0539)  Intake/Output from previous day: 03/03 0701 - 03/04 0700 In: 580 [P.O.:580] Out: 350 [Urine:350] Intake/Output this shift:     Recent Labs  12/23/14 0650 12/24/14 0558  HGB 12.2* 11.3*    Recent Labs  12/23/14 0650 12/24/14 0558  WBC 13.2* 15.9*  RBC 4.23 3.94*  HCT 37.0* 33.7*  PLT 274 275    Recent Labs  12/23/14 0650  NA 136  K 4.4  CL 104  CO2 22  BUN 10  CREATININE 0.85  GLUCOSE 136*  CALCIUM 9.0   No results for input(s): LABPT, INR in the last 72 hours.  Sensation intact distally Intact pulses distally Dorsiflexion/Plantar flexion intact Incision: scant drainage No cellulitis present Compartment soft  Assessment/Plan: 3 Days Post-Op Procedure(s) (LRB): LEFT TOTAL KNEE ARTHROPLASTY (Left) Up with therapy Discharge home with home health today.  Wilmar Prabhakar Y 12/25/2014, 6:56 AM

## 2014-12-25 NOTE — Discharge Summary (Signed)
Patient ID: Ryan Craig MRN: [id number] DOB/AGE: [birth year] 69 y.o.  Admit date: 12/22/2014 Discharge date: 12/25/2014  Admission Diagnoses:  Principal Problem:   Osteoarthritis of left knee Active Problems:   Status post total left knee replacement   Discharge Diagnoses:  Same  Past Medical History  Diagnosis Date  . Hypertension     takes Amlodipine and Quinapril daily  . Hyperlipidemia     was on Lipitor but was taken off 3-45yrs ago by medical md bc running liver enzymes up  . Arthritis   . Joint pain   . Joint swelling   . Chronic back pain     deteriorating  . Anemia     no on any meds    Surgeries: Procedure(s): LEFT TOTAL KNEE ARTHROPLASTY on 12/22/2014   Consultants:    Discharged Condition: Improved  Hospital Course: Ryan Craig is an 69 y.o. male who was admitted 12/22/2014 for operative treatment ofOsteoarthritis of left knee. Patient has severe unremitting pain that affects sleep, daily activities, and work/hobbies. After pre-op clearance the patient was taken to the operating room on 12/22/2014 and underwent  Procedure(s): LEFT TOTAL KNEE ARTHROPLASTY.    Patient was given perioperative antibiotics: Anti-infectives    Start     Dose/Rate Route Frequency Ordered Stop   12/22/14 1700  ceFAZolin (ANCEF) IVPB 1 g/50 mL premix     1 g 100 mL/hr over 30 Minutes Intravenous Every 6 hours 12/22/14 1653 12/23/14 0131   12/22/14 0600  ceFAZolin (ANCEF) IVPB 2 g/50 mL premix     2 g 100 mL/hr over 30 Minutes Intravenous On call to O.R. 12/21/14 1409 12/22/14 1238       Patient was given sequential compression devices, early ambulation, and chemoprophylaxis to prevent DVT.  Patient benefited maximally from hospital stay and there were no complications.    Recent vital signs: Patient Vitals for the past 24 hrs:  BP Temp Temp src Pulse Resp SpO2  12/25/14 0539 (!) 105/48 mmHg 99.2 F (37.3 C) Oral 100 15 93 %  12/25/14 0400 - - - - 17 -  12/25/14 0000 - - - -  16 -  12/24/14 2000 - - - - 17 -  12/24/14 1923 118/67 mmHg 99.9 F (37.7 C) Oral (!) 107 18 93 %  12/24/14 1429 101/62 mmHg (!) 100.8 F (38.2 C) Oral (!) 114 18 92 %  12/24/14 1200 - - - - 16 94 %  12/24/14 0800 - - - - 17 93 %     Recent laboratory studies:  Recent Labs  12/23/14 0650 12/24/14 0558  WBC 13.2* 15.9*  HGB 12.2* 11.3*  HCT 37.0* 33.7*  PLT 274 275  NA 136  --   K 4.4  --   CL 104  --   CO2 22  --   BUN 10  --   CREATININE 0.85  --   GLUCOSE 136*  --   CALCIUM 9.0  --      Discharge Medications:     Medication List    STOP taking these medications        HYDROcodone-acetaminophen 5-325 MG per tablet  Commonly known as:  NORCO/VICODIN      TAKE these medications        amLODipine 10 MG tablet  Commonly known as:  NORVASC  Take 10 mg by mouth daily.     aspirin EC 81 MG tablet  Take 81 mg by mouth daily.     cyclobenzaprine 10 MG tablet  Commonly known as:  FLEXERIL  Take 1 tablet (10 mg total) by mouth 3 (three) times daily as needed for muscle spasms.     oxyCODONE-acetaminophen 5-325 MG per tablet  Commonly known as:  ROXICET  Take 1-2 tablets by mouth every 4 (four) hours as needed for severe pain.     quinapril 40 MG tablet  Commonly known as:  ACCUPRIL  Take 40 mg by mouth at bedtime.     rivaroxaban 10 MG Tabs tablet  Commonly known as:  XARELTO  Take 1 tablet (10 mg total) by mouth daily with breakfast.     VITAMIN B-6 PO  Take 100 mg by mouth daily.     VITAMIN D-3 PO  Take 2 tablets by mouth daily.        Diagnostic Studies: Dg Knee Left Port  12/22/2014   CLINICAL DATA:  Status post total knee arthroplasty  EXAM: PORTABLE LEFT KNEE - 1-2 VIEW  COMPARISON:  Left knee MRI November 13, 2013  FINDINGS: Frontal and lateral views were obtained. Patient is status post total knee arthroplasty with femoral and tibial components appearing well-seated. No acute fracture or dislocation. Air within the joint is an expected  postoperative finding.  IMPRESSION: Prosthetic components appear well seated. No acute fracture or dislocation.   Electronically Signed   By: Lowella Grip III M.D.   On: 12/22/2014 15:19    Disposition: 01-Home or Self Care      Discharge Instructions    Call MD / Call 911    Complete by:  As directed   If you experience chest pain or shortness of breath, CALL 911 and be transported to the hospital emergency room.  If you develope a fever above 101 F, pus (white drainage) or increased drainage or redness at the wound, or calf pain, call your surgeon's office.     Constipation Prevention    Complete by:  As directed   Drink plenty of fluids.  Prune juice may be helpful.  You may use a stool softener, such as Colace (over the counter) 100 mg twice a day.  Use MiraLax (over the counter) for constipation as needed.     Diet - low sodium heart healthy    Complete by:  As directed      Discharge instructions    Complete by:  As directed   Increase activities as comfort allows. Work on knee motion as much as possible. Do take an over the counter stool softener (colace or miralax) twice daily. You can get your current knee dressing wet daily in the shower. Leave your current dressing on until your outpatient follow-up.     Discharge patient    Complete by:  As directed      Increase activity slowly as tolerated    Complete by:  As directed            Follow-up Information    Follow up with Mcarthur Rossetti, MD In 2 weeks.   Specialty:  Orthopedic Surgery   Contact information:   [place] [address] [phone]        Signed: Mcarthur Rossetti 12/25/2014, 6:57 AM

## 2014-12-26 DIAGNOSIS — Z471 Aftercare following joint replacement surgery: Secondary | ICD-10-CM | POA: Diagnosis not present

## 2014-12-26 DIAGNOSIS — I10 Essential (primary) hypertension: Secondary | ICD-10-CM | POA: Diagnosis not present

## 2014-12-26 DIAGNOSIS — M1990 Unspecified osteoarthritis, unspecified site: Secondary | ICD-10-CM | POA: Diagnosis not present

## 2014-12-26 DIAGNOSIS — Z87891 Personal history of nicotine dependence: Secondary | ICD-10-CM | POA: Diagnosis not present

## 2014-12-26 DIAGNOSIS — Z6833 Body mass index (BMI) 33.0-33.9, adult: Secondary | ICD-10-CM | POA: Diagnosis not present

## 2014-12-26 DIAGNOSIS — Z96653 Presence of artificial knee joint, bilateral: Secondary | ICD-10-CM | POA: Diagnosis not present

## 2014-12-28 DIAGNOSIS — Z96653 Presence of artificial knee joint, bilateral: Secondary | ICD-10-CM | POA: Diagnosis not present

## 2014-12-28 DIAGNOSIS — Z471 Aftercare following joint replacement surgery: Secondary | ICD-10-CM | POA: Diagnosis not present

## 2014-12-28 DIAGNOSIS — M1990 Unspecified osteoarthritis, unspecified site: Secondary | ICD-10-CM | POA: Diagnosis not present

## 2014-12-28 DIAGNOSIS — Z87891 Personal history of nicotine dependence: Secondary | ICD-10-CM | POA: Diagnosis not present

## 2014-12-28 DIAGNOSIS — I10 Essential (primary) hypertension: Secondary | ICD-10-CM | POA: Diagnosis not present

## 2014-12-28 DIAGNOSIS — Z6833 Body mass index (BMI) 33.0-33.9, adult: Secondary | ICD-10-CM | POA: Diagnosis not present

## 2014-12-30 DIAGNOSIS — Z96653 Presence of artificial knee joint, bilateral: Secondary | ICD-10-CM | POA: Diagnosis not present

## 2014-12-30 DIAGNOSIS — M1990 Unspecified osteoarthritis, unspecified site: Secondary | ICD-10-CM | POA: Diagnosis not present

## 2014-12-30 DIAGNOSIS — Z6833 Body mass index (BMI) 33.0-33.9, adult: Secondary | ICD-10-CM | POA: Diagnosis not present

## 2014-12-30 DIAGNOSIS — Z471 Aftercare following joint replacement surgery: Secondary | ICD-10-CM | POA: Diagnosis not present

## 2014-12-30 DIAGNOSIS — Z87891 Personal history of nicotine dependence: Secondary | ICD-10-CM | POA: Diagnosis not present

## 2014-12-30 DIAGNOSIS — I10 Essential (primary) hypertension: Secondary | ICD-10-CM | POA: Diagnosis not present

## 2015-01-01 DIAGNOSIS — Z6833 Body mass index (BMI) 33.0-33.9, adult: Secondary | ICD-10-CM | POA: Diagnosis not present

## 2015-01-01 DIAGNOSIS — Z96653 Presence of artificial knee joint, bilateral: Secondary | ICD-10-CM | POA: Diagnosis not present

## 2015-01-01 DIAGNOSIS — Z87891 Personal history of nicotine dependence: Secondary | ICD-10-CM | POA: Diagnosis not present

## 2015-01-01 DIAGNOSIS — Z471 Aftercare following joint replacement surgery: Secondary | ICD-10-CM | POA: Diagnosis not present

## 2015-01-01 DIAGNOSIS — I10 Essential (primary) hypertension: Secondary | ICD-10-CM | POA: Diagnosis not present

## 2015-01-01 DIAGNOSIS — M1990 Unspecified osteoarthritis, unspecified site: Secondary | ICD-10-CM | POA: Diagnosis not present

## 2015-01-04 DIAGNOSIS — I10 Essential (primary) hypertension: Secondary | ICD-10-CM | POA: Diagnosis not present

## 2015-01-04 DIAGNOSIS — Z471 Aftercare following joint replacement surgery: Secondary | ICD-10-CM | POA: Diagnosis not present

## 2015-01-04 DIAGNOSIS — M1990 Unspecified osteoarthritis, unspecified site: Secondary | ICD-10-CM | POA: Diagnosis not present

## 2015-01-04 DIAGNOSIS — Z96653 Presence of artificial knee joint, bilateral: Secondary | ICD-10-CM | POA: Diagnosis not present

## 2015-01-04 DIAGNOSIS — Z6833 Body mass index (BMI) 33.0-33.9, adult: Secondary | ICD-10-CM | POA: Diagnosis not present

## 2015-01-04 DIAGNOSIS — Z87891 Personal history of nicotine dependence: Secondary | ICD-10-CM | POA: Diagnosis not present

## 2015-01-05 DIAGNOSIS — Z96652 Presence of left artificial knee joint: Secondary | ICD-10-CM | POA: Diagnosis not present

## 2015-01-06 DIAGNOSIS — M1990 Unspecified osteoarthritis, unspecified site: Secondary | ICD-10-CM | POA: Diagnosis not present

## 2015-01-06 DIAGNOSIS — Z6833 Body mass index (BMI) 33.0-33.9, adult: Secondary | ICD-10-CM | POA: Diagnosis not present

## 2015-01-06 DIAGNOSIS — Z96653 Presence of artificial knee joint, bilateral: Secondary | ICD-10-CM | POA: Diagnosis not present

## 2015-01-06 DIAGNOSIS — Z87891 Personal history of nicotine dependence: Secondary | ICD-10-CM | POA: Diagnosis not present

## 2015-01-06 DIAGNOSIS — Z471 Aftercare following joint replacement surgery: Secondary | ICD-10-CM | POA: Diagnosis not present

## 2015-01-06 DIAGNOSIS — I10 Essential (primary) hypertension: Secondary | ICD-10-CM | POA: Diagnosis not present

## 2015-01-08 DIAGNOSIS — Z96653 Presence of artificial knee joint, bilateral: Secondary | ICD-10-CM | POA: Diagnosis not present

## 2015-01-08 DIAGNOSIS — Z87891 Personal history of nicotine dependence: Secondary | ICD-10-CM | POA: Diagnosis not present

## 2015-01-08 DIAGNOSIS — M1990 Unspecified osteoarthritis, unspecified site: Secondary | ICD-10-CM | POA: Diagnosis not present

## 2015-01-08 DIAGNOSIS — I10 Essential (primary) hypertension: Secondary | ICD-10-CM | POA: Diagnosis not present

## 2015-01-08 DIAGNOSIS — Z471 Aftercare following joint replacement surgery: Secondary | ICD-10-CM | POA: Diagnosis not present

## 2015-01-08 DIAGNOSIS — Z6833 Body mass index (BMI) 33.0-33.9, adult: Secondary | ICD-10-CM | POA: Diagnosis not present

## 2015-01-11 DIAGNOSIS — Z6833 Body mass index (BMI) 33.0-33.9, adult: Secondary | ICD-10-CM | POA: Diagnosis not present

## 2015-01-11 DIAGNOSIS — Z87891 Personal history of nicotine dependence: Secondary | ICD-10-CM | POA: Diagnosis not present

## 2015-01-11 DIAGNOSIS — Z471 Aftercare following joint replacement surgery: Secondary | ICD-10-CM | POA: Diagnosis not present

## 2015-01-11 DIAGNOSIS — Z96653 Presence of artificial knee joint, bilateral: Secondary | ICD-10-CM | POA: Diagnosis not present

## 2015-01-11 DIAGNOSIS — M1990 Unspecified osteoarthritis, unspecified site: Secondary | ICD-10-CM | POA: Diagnosis not present

## 2015-01-11 DIAGNOSIS — I10 Essential (primary) hypertension: Secondary | ICD-10-CM | POA: Diagnosis not present

## 2015-01-13 DIAGNOSIS — Z96653 Presence of artificial knee joint, bilateral: Secondary | ICD-10-CM | POA: Diagnosis not present

## 2015-01-13 DIAGNOSIS — I10 Essential (primary) hypertension: Secondary | ICD-10-CM | POA: Diagnosis not present

## 2015-01-13 DIAGNOSIS — Z87891 Personal history of nicotine dependence: Secondary | ICD-10-CM | POA: Diagnosis not present

## 2015-01-13 DIAGNOSIS — M1990 Unspecified osteoarthritis, unspecified site: Secondary | ICD-10-CM | POA: Diagnosis not present

## 2015-01-13 DIAGNOSIS — Z6833 Body mass index (BMI) 33.0-33.9, adult: Secondary | ICD-10-CM | POA: Diagnosis not present

## 2015-01-13 DIAGNOSIS — Z471 Aftercare following joint replacement surgery: Secondary | ICD-10-CM | POA: Diagnosis not present

## 2015-01-15 DIAGNOSIS — Z6833 Body mass index (BMI) 33.0-33.9, adult: Secondary | ICD-10-CM | POA: Diagnosis not present

## 2015-01-15 DIAGNOSIS — I10 Essential (primary) hypertension: Secondary | ICD-10-CM | POA: Diagnosis not present

## 2015-01-15 DIAGNOSIS — Z87891 Personal history of nicotine dependence: Secondary | ICD-10-CM | POA: Diagnosis not present

## 2015-01-15 DIAGNOSIS — M1990 Unspecified osteoarthritis, unspecified site: Secondary | ICD-10-CM | POA: Diagnosis not present

## 2015-01-15 DIAGNOSIS — Z471 Aftercare following joint replacement surgery: Secondary | ICD-10-CM | POA: Diagnosis not present

## 2015-01-15 DIAGNOSIS — Z96653 Presence of artificial knee joint, bilateral: Secondary | ICD-10-CM | POA: Diagnosis not present

## 2015-01-18 ENCOUNTER — Ambulatory Visit: Payer: Medicare Other | Attending: Orthopaedic Surgery

## 2015-01-18 DIAGNOSIS — R262 Difficulty in walking, not elsewhere classified: Secondary | ICD-10-CM | POA: Diagnosis not present

## 2015-01-18 DIAGNOSIS — M25562 Pain in left knee: Secondary | ICD-10-CM | POA: Insufficient documentation

## 2015-01-18 DIAGNOSIS — R29898 Other symptoms and signs involving the musculoskeletal system: Secondary | ICD-10-CM | POA: Diagnosis not present

## 2015-01-18 DIAGNOSIS — R609 Edema, unspecified: Secondary | ICD-10-CM | POA: Diagnosis not present

## 2015-01-18 NOTE — Therapy (Signed)
[place], [place], [address] Phone: [phone]   Fax:  [phone]  Physical Therapy Evaluation  Patient Details  Name: Ryan Craig MRN: [id number] Date of Birth: [birth year] Referring Provider:  Mcarthur Rossetti*  Encounter Date: 01/18/2015      PT End of Session - 01/18/15 0951    Visit Number 1   Number of Visits 24   Date for PT Re-Evaluation 03/29/15   PT Start Time 0920   PT Stop Time 1010   PT Time Calculation (min) 50 min   Activity Tolerance Patient tolerated treatment well;Patient limited by pain   Behavior During Therapy [facility] for tasks assessed/performed      Past Medical History  Diagnosis Date  . Hypertension     takes Amlodipine and Quinapril daily  . Hyperlipidemia     was on Lipitor but was taken off 3-51yrs ago by medical md bc running liver enzymes up  . Arthritis   . Joint pain   . Joint swelling   . Chronic back pain     deteriorating  . Anemia     no on any meds    Past Surgical History  Procedure Laterality Date  . Arthroscopic knee surgery Right   . Colonoscopy    . Knee arthroscopy Left 02/24/2014    Procedure: LEFT KNEE ARTHROSCOPY WITH PARTIAL MEDIAL MENISCECTOMY;  Surgeon: Mcarthur Rossetti, MD;  Location: [place];  Service: Orthopedics;  Laterality: Left;  . Inguinal hernia repair Bilateral     x 4  . Joint replacement Right 6 yrs ago    knee  . Total knee arthroplasty Left 12/22/2014    Procedure: LEFT TOTAL KNEE ARTHROPLASTY;  Surgeon: Mcarthur Rossetti, MD;  Location: [place];  Service: Orthopedics;  Laterality: Left;    There were no vitals filed for this visit.  Visit Diagnosis:  Pain, joint, knee, left - Plan: PT plan of care cert/re-cert  Edema - Plan: PT plan of care cert/re-cert  Difficulty walking - Plan: PT plan of care cert/re-cert  Weakness of left lower extremity - Plan: PT plan of care cert/re-cert      Subjective Assessment  - 01/18/15 0920    Symptoms Pain in LT anee from surgery.    Pertinent History LT knee problems for a while. He had a scope in past summer 2/15 and  now TKA on LT. Also has had RT TKA   Limitations Walking;Standing   How long can you sit comfortably? All day   How long can you stand comfortably? 15 min   How long can you walk comfortably? 15 min   Patient Stated Goals Get knee to work right to return to gardening, yard work   Currently in Pain? Yes   Pain Score 5   no pain at rest with knee extended sitting   Pain Location Knee   Pain Orientation Left   Pain Descriptors / Indicators Aching   Pain Type Surgical pain   Pain Onset 1 to 4 weeks ago   Pain Frequency Constant   Aggravating Factors  Standing and walking, stretching   Pain Relieving Factors Medicaition, rest, ice   Multiple Pain Sites No            [facility] PT Assessment - 01/18/15 0925    Assessment   Medical Diagnosis LT TKA   Onset Date 12/22/14   Prior Therapy HHPT   Precautions   Precautions None   Restrictions   Weight  Bearing Restrictions No   Balance Screen   Has the patient fallen in the past 6 months Yes   How many times? 3  LT knee buckled   Has the patient had a decrease in activity level because of a fear of falling?  Yes   Is the patient reluctant to leave their home because of a fear of falling?  Yes   [place] Private residence   Living Arrangements Spouse/significant other   Type of [place]   Prior Function   Level of [place] with basic ADLs   Leisure Gardening   Cognition   Overall Cognitive Status Within Functional Limits for tasks assessed   Observation/Other Assessments   Observations Edema LT knee 50 CM  rt 46 cm   ROM / Strength   AROM / PROM / Strength AROM;PROM;Strength   AROM   AROM Assessment Site Knee   Right/Left Knee Right;Left   Right Knee Extension -10   Left Knee Extension -28   Left Knee Flexion 88   PROM   PROM  Assessment Site Knee   Right/Left Knee Left   Left Knee Extension -8   Left Knee Flexion 90   Strength   Strength Assessment Site Knee   Right/Left Knee Left   Left Knee Flexion 4+/5   Left Knee Extension 4-/5   Palpation   Palpation decreased patella mobility all planes and swelling noted   Ambulation/Gait   Ambulation/Gait Yes   Ambulation/Gait Assistance 6: Modified independent (Device/Increase time)   Assistive device Straight cane   Ambulation Surface Level;Indoor   Gait velocity 3.6 ft/sec   Functional Gait  Assessment   Gait assessed  Yes                   [facility] Adult PT Treatment/Exercise - 01/18/15 0947    Exercises   Exercises Knee/Hip   Knee/Hip Exercises: Aerobic   Stationary Bike Nustep L5  LE only 5 min   Modalities   Modalities Cryotherapy   Cryotherapy   Number Minutes Cryotherapy 15 Minutes   Cryotherapy Location Knee   Type of Cryotherapy --  game ready                PT Education - 01/18/15 0951    Education provided Yes   Education Details POC   Person(s) Educated Patient   Methods Explanation   Comprehension Verbalized understanding          PT Short Term Goals - 01/18/15 0957    PT SHORT TERM GOAL #1   Title Independent wiht intial HEp   Baseline Need review of HHPT program   Time 2   Period Weeks   Status New   PT SHORT TERM GOAL #2   Title Active LT knee extension to -10 degrees for improved stability with walking   Time 4   Period Weeks   Status New   PT SHORT TERM GOAL #3   Title Active LT knee flexion to 110 degrees for improved gait   Time 4   Period Weeks   Status New   PT SHORT TERM GOAL #4   Title Decreased edema to 48 cm Lt knee   Time 4   Period Weeks   Status New           PT Long Term Goals - 01/18/15 9562    PT LONG TERM GOAL #1   Title independent with all HEP issued as of last visit  Time 10   Period Weeks   Status New   PT LONG TERM GOAL #2   Title walk without device in and  out of home   Time 10   Period Weeks   Status New   PT LONG TERM GOAL #3   Title Improve active LT knee flexion to 120 degrees to allow return to gardening with squatting and mowing grass   Time 10   Period Weeks   Status New   PT LONG TERM GOAL #4   Title Active LT knee exteniosn to -5 degrees to demo improved strength and knee stability with walking.    PT LONG TERM GOAL #5   Title Edema reduced to 46 CM to allow for increased LT knee flexion   Time 10   Period Weeks   Status New   Additional Long Term Goals   Additional Long Term Goals Yes   PT LONG TERM GOAL #6   Title He will be able to walk and stand for 45 min on more for home/yard tasks   Time 10   Period Weeks   Status New               Plan - 2015/02/11 2992    Clinical Impression Statement Mr Salzman with edema, limited range andstrength limiting activity at home   Pt will benefit from skilled therapeutic intervention in order to improve on the following deficits Difficulty walking;Decreased strength;Decreased range of motion;Decreased endurance;Pain;Increased edema   Rehab Potential Good   PT Frequency 3x / week   PT Duration 4 weeks  then 2x/week for 6 weeks   PT Treatment/Interventions Therapeutic exercise;Gait training;Patient/family education;Passive range of motion;Manual techniques;Manual lymph drainage;Cryotherapy;Neuromuscular re-education   PT Next Visit Plan REview HEP from HHPT and progress stretching   PT Home Exercise Plan Cont HhPT program   Consulted and Agree with Plan of Care Patient          G-Codes - 02/11/15 1003    Functional Assessment Tool Used FOTO   Functional Limitation Mobility: Walking and moving around   Mobility: Walking and Moving Around Current Status [id number]) At least 20 percent but less than 40 percent impaired, limited or restricted   Mobility: Walking and Moving Around Goal Status [phone]) At least 1 percent but less than 20 percent impaired, limited or restricted        Problem List Patient Active Problem List   Diagnosis Date Noted  . Osteoarthritis of left knee 12/22/2014  . Status post total left knee replacement 12/22/2014  . Acute medial meniscus tear of left knee 02/24/2014    Darrel Hoover PT 02-11-15, 10:06 AM  [facility] [address] [place], [place], [address] Phone: [phone]   Fax:  [phone]

## 2015-01-18 NOTE — Patient Instructions (Signed)
Asked patient to bring  In HEP

## 2015-01-20 ENCOUNTER — Ambulatory Visit: Payer: Medicare Other | Admitting: Physical Therapy

## 2015-01-20 DIAGNOSIS — R29898 Other symptoms and signs involving the musculoskeletal system: Secondary | ICD-10-CM

## 2015-01-20 DIAGNOSIS — R609 Edema, unspecified: Secondary | ICD-10-CM | POA: Diagnosis not present

## 2015-01-20 DIAGNOSIS — M25562 Pain in left knee: Secondary | ICD-10-CM | POA: Diagnosis not present

## 2015-01-20 DIAGNOSIS — R262 Difficulty in walking, not elsewhere classified: Secondary | ICD-10-CM | POA: Diagnosis not present

## 2015-01-20 NOTE — Therapy (Addendum)
[place], [place], [address] Phone: [phone]   Fax:  [phone]  Physical Therapy Treatment  Patient Details  Name: Ryan Craig MRN: [id number] Date of Birth: November 18, 1945 Referring Provider:  Glendale Chard, MD  Encounter Date: 01/20/2015        PT End of Session - 01/20/15 1536    Activity Tolerance Patient tolerated treatment well;Patient limited by pain    Visit #:2 Number of visits: 24 Date of re-eval:  03/29/2015 Time in 14 15  Time out 24  Past Medical History  Diagnosis Date  . Hypertension     takes Amlodipine and Quinapril daily  . Hyperlipidemia     was on Lipitor but was taken off 3-3yrs ago by medical md bc running liver enzymes up  . Arthritis   . Joint pain   . Joint swelling   . Chronic back pain     deteriorating  . Anemia     no on any meds    Past Surgical History  Procedure Laterality Date  . Arthroscopic knee surgery Right   . Colonoscopy    . Knee arthroscopy Left 02/24/2014    Procedure: LEFT KNEE ARTHROSCOPY WITH PARTIAL MEDIAL MENISCECTOMY;  Surgeon: Mcarthur Rossetti, MD;  Location: [place];  Service: Orthopedics;  Laterality: Left;  . Inguinal hernia repair Bilateral     x 4  . Joint replacement Right 6 yrs ago    knee  . Total knee arthroplasty Left 12/22/2014    Procedure: LEFT TOTAL KNEE ARTHROPLASTY;  Surgeon: Mcarthur Rossetti, MD;  Location: [place];  Service: Orthopedics;  Laterality: Left;    There were no vitals filed for this visit.  Visit Diagnosis:  Edema  Pain, joint, knee, left  Weakness of left lower extremity      Subjective Assessment - 01/20/15 1438    Symptoms Not sleeping well, doing home exercises. 2-3/10 pain   Currently in Pain? Yes   Pain Score 3    Pain Location Knee   Pain Orientation Left   Pain Descriptors / Indicators Aching   Pain Type Surgical pain   Aggravating Factors  Standing,                         [facility] Adult PT Treatment/Exercise - 01/20/15 1420    Knee/Hip Exercises: Aerobic   Stationary Bike Nu step  focus on stretches using arms   Knee/Hip Exercises: Seated   Heel Slides 10 reps  foot on towel   Knee/Hip Exercises: Supine   Quad Sets 10 reps  small then smaller rolled towel under knee 10 reps 2 sets,    Short Arc Quad Sets 3 sets  0,2,5 LBS.   Heel Slides 10 reps  with overpressure   Patellar Mobs Taught wife how to do   Knee Extension --  10 reps passive and AA ROM   Cryotherapy   Number Minutes Cryotherapy 15 Minutes   Cryotherapy Location Knee   Type of Cryotherapy --  cold pack   Manual Therapy   Manual Therapy --  PROM Flex/Ext,patellar mobs, retrograde start proximal thigh                PT Education - 01/20/15 1533    Education provided Yes   Education Details Sleeping positions, use of pillows, remove tape if irritating, Standing step step   Person(s) Educated Patient;Spouse   Methods Explanation;Demonstration;Handout   Comprehension Verbalized understanding  PT Short Term Goals - 01/20/15 1539    PT SHORT TERM GOAL #1   Title Independent wiht intial HEp   Time 2   Period Weeks   Status Achieved   PT SHORT TERM GOAL #2   Title Active LT knee extension to -10 degrees for improved stability with walking   Time 4   Period Weeks   Status On-going   PT SHORT TERM GOAL #3   Time 4   Period Weeks   Status On-going   PT SHORT TERM GOAL #4   Title Decreased edema to 48 cm Lt knee   Time 4   Period Weeks   Status Unable to assess           PT Long Term Goals - 01/18/15 0959    PT LONG TERM GOAL #1   Title independent with all HEP issued as of last visit   Time 10   Period Weeks   Status New   PT LONG TERM GOAL #2   Title walk without device in and out of home   Time 10   Period Weeks   Status New   PT LONG TERM GOAL #3   Title Improve active LT knee flexion to 120 degrees to  allow return to gardening with squatting and mowing grass   Time 10   Period Weeks   Status New   PT LONG TERM GOAL #4   Title Active LT knee exteniosn to -5 degrees to demo improved strength and knee stability with walking.    PT LONG TERM GOAL #5   Title Edema reduced to 46 CM to allow for increased LT knee flexion   Time 10   Period Weeks   Status New   Additional Long Term Goals   Additional Long Term Goals Yes   PT LONG TERM GOAL #6   Title He will be able to walk and stand for 45 min on more for home/yard tasks   Time 10   Period Weeks   Status New               Plan - 01/20/15 1536    Clinical Impression Statement Premedicated for pain, PT is still painful, 6/10 pain post session.  Patella more mobile. 85-90 best flexion.  Passive extension good,  Quads inhibited..Wife helpful.   PT Next Visit Plan assess taping, re tape if needed.  Try step ups, standing step stretch, strap mobs for flexion   Consulted and Agree with Plan of Care Patient        Problem List Patient Active Problem List   Diagnosis Date Noted  . Osteoarthritis of left knee 12/22/2014  . Status post total left knee replacement 12/22/2014  . Acute medial meniscus tear of left knee 02/24/2014   Melvenia Needles, PTA 01/20/2015 3:42 PM Phone: [phone] Fax: [phone]   [facility] 01/20/2015, 3:42 PM  [facility] [address] [place], [place], [address] Phone: [phone]   Fax:  [phone]

## 2015-01-25 ENCOUNTER — Ambulatory Visit: Payer: Medicare Other | Attending: Orthopaedic Surgery | Admitting: Physical Therapy

## 2015-01-25 DIAGNOSIS — R262 Difficulty in walking, not elsewhere classified: Secondary | ICD-10-CM | POA: Insufficient documentation

## 2015-01-25 DIAGNOSIS — R609 Edema, unspecified: Secondary | ICD-10-CM | POA: Insufficient documentation

## 2015-01-25 DIAGNOSIS — M25562 Pain in left knee: Secondary | ICD-10-CM | POA: Insufficient documentation

## 2015-01-25 DIAGNOSIS — R29898 Other symptoms and signs involving the musculoskeletal system: Secondary | ICD-10-CM | POA: Insufficient documentation

## 2015-01-25 NOTE — Therapy (Signed)
[place], [place], [address] Phone: [phone]   Fax:  [phone]  Physical Therapy Treatment  Patient Details  Name: Ryan Craig MRN: [id number] Date of Birth: 06-17-1946 Referring Provider:  Glendale Chard, MD  Encounter Date: 01/25/2015      PT End of Session - 01/25/15 0855    Visit Number 3   Number of Visits 24   Date for PT Re-Evaluation 03/29/15   PT Start Time 0845   PT Stop Time 0940   PT Time Calculation (min) 55 min   Activity Tolerance Patient tolerated treatment well   Behavior During Therapy [facility] for tasks assessed/performed      Past Medical History  Diagnosis Date  . Hypertension     takes Amlodipine and Quinapril daily  . Hyperlipidemia     was on Lipitor but was taken off 3-59yrs ago by medical md bc running liver enzymes up  . Arthritis   . Joint pain   . Joint swelling   . Chronic back pain     deteriorating  . Anemia     no on any meds    Past Surgical History  Procedure Laterality Date  . Arthroscopic knee surgery Right   . Colonoscopy    . Knee arthroscopy Left 02/24/2014    Procedure: LEFT KNEE ARTHROSCOPY WITH PARTIAL MEDIAL MENISCECTOMY;  Surgeon: Mcarthur Rossetti, MD;  Location: [place];  Service: Orthopedics;  Laterality: Left;  . Inguinal hernia repair Bilateral     x 4  . Joint replacement Right 6 yrs ago    knee  . Total knee arthroplasty Left 12/22/2014    Procedure: LEFT TOTAL KNEE ARTHROPLASTY;  Surgeon: Mcarthur Rossetti, MD;  Location: [place];  Service: Orthopedics;  Laterality: Left;    There were no vitals filed for this visit.  Visit Diagnosis:  Edema  Pain, joint, knee, left  Weakness of left lower extremity  Difficulty walking      Subjective Assessment - 01/25/15 0851    Subjective things are about the same since the last visit. " I still have pain in the knee at night making it difficult to sleep"   Currently in Pain? Yes   Pain Score 4   took pain pill this morning   Pain Location Knee   Pain Orientation Left   Pain Descriptors / Indicators Aching;Sore;Tightness   Pain Type Surgical pain   Pain Onset 1 to 4 weeks ago   Pain Frequency Constant            [facility] PT Assessment - 01/25/15 0001    AROM   Left Knee Extension -2   Left Knee Flexion 83                   [facility] Adult PT Treatment/Exercise - 01/25/15 0853    Knee/Hip Exercises: Aerobic   Stationary Bike Nu Step L5 x 8 min   Knee/Hip Exercises: Supine   Quad Sets 10 reps;AROM;Strengthening;Left  holding for 3 sec with towel under heel.    Short Arc Quad Sets AROM;Strengthening;Left;10 reps;2 sets  with 2#   Heel Slides 15 reps;AAROM;Left  with strap   Modalities   Modalities Cryotherapy   Cryotherapy   Number Minutes Cryotherapy 15 Minutes   Cryotherapy Location Knee   Type of Cryotherapy Other (comment)   Manual Therapy   Manual Therapy Joint mobilization;Massage   Joint Mobilization grade 2 tibiofem grade 2 A<>P, Grade 2-3 patellar mobs in  all dir   Massage cross friction massage                 PT Education - 01/25/15 1101    Education provided Yes   Education Details flexing and straightening knee in the car   Person(s) Educated Patient;Spouse   Methods Explanation   Comprehension Verbalized understanding          PT Short Term Goals - 01/20/15 1539    PT SHORT TERM GOAL #1   Title Independent wiht intial HEp   Time 2   Period Weeks   Status Achieved   PT SHORT TERM GOAL #2   Title Active LT knee extension to -10 degrees for improved stability with walking   Time 4   Period Weeks   Status On-going   PT SHORT TERM GOAL #3   Time 4   Period Weeks   Status On-going   PT SHORT TERM GOAL #4   Title Decreased edema to 48 cm Lt knee   Time 4   Period Weeks   Status Unable to assess           PT Long Term Goals - 01/18/15 0959    PT LONG TERM GOAL #1   Title independent with all HEP  issued as of last visit   Time 10   Period Weeks   Status New   PT LONG TERM GOAL #2   Title walk without device in and out of home   Time 10   Period Weeks   Status New   PT LONG TERM GOAL #3   Title Improve active LT knee flexion to 120 degrees to allow return to gardening with squatting and mowing grass   Time 10   Period Weeks   Status New   PT LONG TERM GOAL #4   Title Active LT knee exteniosn to -5 degrees to demo improved strength and knee stability with walking.    PT LONG TERM GOAL #5   Title Edema reduced to 46 CM to allow for increased LT knee flexion   Time 10   Period Weeks   Status New   Additional Long Term Goals   Additional Long Term Goals Yes   PT LONG TERM GOAL #6   Title He will be able to walk and stand for 45 min on more for home/yard tasks   Time 10   Period Weeks   Status New               Plan - 01/25/15 1103    Clinical Impression Statement Tr[place] continues to demonstrate pain with L knee at end range at -2 to 83 with significant edema. Performed some cross friction massage to assist with incision mobility. PT asked if pt could come in shorts next visit  to help with better access for STM and edema reduction techniques.    PT Next Visit Plan assess taping, re tape if needed.  Try step ups, standing step stretch, strap mobs for flexion   PT Home Exercise Plan same as previous        Problem List Patient Active Problem List   Diagnosis Date Noted  . Osteoarthritis of left knee 12/22/2014  . Status post total left knee replacement 12/22/2014  . Acute medial meniscus tear of left knee 02/24/2014   Starr Lake PT, DPT, LAT, ATC  01/25/2015  12:09 PM    [facility] [address] [place], [place], [address] Phone:  [phone]   Fax:  [phone]

## 2015-01-27 ENCOUNTER — Ambulatory Visit: Payer: Medicare Other

## 2015-01-27 DIAGNOSIS — R609 Edema, unspecified: Secondary | ICD-10-CM

## 2015-01-27 DIAGNOSIS — M25662 Stiffness of left knee, not elsewhere classified: Secondary | ICD-10-CM

## 2015-01-27 DIAGNOSIS — R262 Difficulty in walking, not elsewhere classified: Secondary | ICD-10-CM

## 2015-01-27 DIAGNOSIS — M25562 Pain in left knee: Secondary | ICD-10-CM

## 2015-01-27 DIAGNOSIS — R29898 Other symptoms and signs involving the musculoskeletal system: Secondary | ICD-10-CM | POA: Diagnosis not present

## 2015-01-27 NOTE — Therapy (Signed)
[place], [place], [address] Phone: [phone]   Fax:  [phone]  Physical Therapy Treatment  Patient Details  Name: Ryan Craig MRN: [id number] Date of Birth: 1946/05/06 Referring Provider:  Glendale Chard, MD  Encounter Date: 01/27/2015      PT End of Session - 01/27/15 0842    Visit Number 4   Date for PT Re-Evaluation 03/29/15   PT Start Time 0840   PT Stop Time 0935   PT Time Calculation (min) 55 min   Activity Tolerance Patient limited by lethargy   Behavior During Therapy [facility] for tasks assessed/performed      Past Medical History  Diagnosis Date  . Hypertension     takes Amlodipine and Quinapril daily  . Hyperlipidemia     was on Lipitor but was taken off 3-30yrs ago by medical md bc running liver enzymes up  . Arthritis   . Joint pain   . Joint swelling   . Chronic back pain     deteriorating  . Anemia     no on any meds    Past Surgical History  Procedure Laterality Date  . Arthroscopic knee surgery Right   . Colonoscopy    . Knee arthroscopy Left 02/24/2014    Procedure: LEFT KNEE ARTHROSCOPY WITH PARTIAL MEDIAL MENISCECTOMY;  Surgeon: Mcarthur Rossetti, MD;  Location: [place];  Service: Orthopedics;  Laterality: Left;  . Inguinal hernia repair Bilateral     x 4  . Joint replacement Right 6 yrs ago    knee  . Total knee arthroplasty Left 12/22/2014    Procedure: LEFT TOTAL KNEE ARTHROPLASTY;  Surgeon: Mcarthur Rossetti, MD;  Location: [place];  Service: Orthopedics;  Laterality: Left;    There were no vitals filed for this visit.  Visit Diagnosis:  Pain, joint, knee, left  Edema  Weakness of left lower extremity  Difficulty walking  Joint stiffness of left lower leg      Subjective Assessment - 01/27/15 0846    Subjective Took pain pill so pain 4-5/10 now   Currently in Pain? Yes   Pain Score 5    Multiple Pain Sites No                        [facility] Adult PT Treatment/Exercise - 01/27/15 0849    Knee/Hip Exercises: Aerobic   Stationary Bike Nu Step L5 x 8 min   Knee/Hip Exercises: Supine   Short Arc Quad Sets AROM;Strengthening;Left;1 set   Short Arc Quad Sets Limitations 30 reps 5 sec hold and 15 reps hold 5 sec 4 pounds   Heel Slides AROM;Left;1 set;5 reps   Heel Slides Limitations 30 sec hold   Knee Flexion Left;1 set;5 reps  30 sec stretch   Cryotherapy   Number Minutes Cryotherapy 12 Minutes   Cryotherapy Location Knee   Type of Cryotherapy Ice pack   Manual Therapy   Manual Therapy Massage   Joint Mobilization patella mobs all directions   Massage STW to LT quad with also  retro grade massage                  PT Short Term Goals - 01/20/15 1539    PT SHORT TERM GOAL #1   Title Independent wiht intial HEp   Time 2   Period Weeks   Status Achieved   PT SHORT TERM GOAL #2   Title Active LT knee extension to -  10 degrees for improved stability with walking   Time 4   Period Weeks   Status On-going   PT SHORT TERM GOAL #3   Time 4   Period Weeks   Status On-going   PT SHORT TERM GOAL #4   Title Decreased edema to 48 cm Lt knee   Time 4   Period Weeks   Status Unable to assess           PT Long Term Goals - 01/18/15 0959    PT LONG TERM GOAL #1   Title independent with all HEP issued as of last visit   Time 10   Period Weeks   Status New   PT LONG TERM GOAL #2   Title walk without device in and out of home   Time 10   Period Weeks   Status New   PT LONG TERM GOAL #3   Title Improve active LT knee flexion to 120 degrees to allow return to gardening with squatting and mowing grass   Time 10   Period Weeks   Status New   PT LONG TERM GOAL #4   Title Active LT knee exteniosn to -5 degrees to demo improved strength and knee stability with walking.    PT LONG TERM GOAL #5   Title Edema reduced to 46 CM to allow for increased LT knee flexion   Time 10   Period Weeks   Status New    Additional Long Term Goals   Additional Long Term Goals Yes   PT LONG TERM GOAL #6   Title He will be able to walk and stand for 45 min on more for home/yard tasks   Time 10   Period Weeks   Status New               Plan - 01/27/15 0920    Clinical Impression Statement Flexion range improved. PAin limiting  but he is able to work with [place] during session.    PT Next Visit Plan Contnue manual and cold. Try some closed chain exercises.    Consulted and Agree with Plan of Care Patient        Problem List Patient Active Problem List   Diagnosis Date Noted  . Osteoarthritis of left knee 12/22/2014  . Status post total left knee replacement 12/22/2014  . Acute medial meniscus tear of left knee 02/24/2014    Darrel Hoover PT 01/27/2015, 9:23 AM  [place] [address] [place], [place], [address] Phone: [phone]   Fax:  [phone]

## 2015-01-29 ENCOUNTER — Ambulatory Visit: Payer: Medicare Other

## 2015-01-29 DIAGNOSIS — R609 Edema, unspecified: Secondary | ICD-10-CM

## 2015-01-29 DIAGNOSIS — R29898 Other symptoms and signs involving the musculoskeletal system: Secondary | ICD-10-CM | POA: Diagnosis not present

## 2015-01-29 DIAGNOSIS — M25562 Pain in left knee: Secondary | ICD-10-CM

## 2015-01-29 DIAGNOSIS — R262 Difficulty in walking, not elsewhere classified: Secondary | ICD-10-CM | POA: Diagnosis not present

## 2015-01-29 NOTE — Therapy (Signed)
[place], [place], [address] Phone: [phone]   Fax:  [phone]  Physical Therapy Treatment  Patient Details  Name: Ryan Craig MRN: [id number] Date of Birth: August 14, 1946 Referring Provider:  Glendale Chard, MD  Encounter Date: 01/29/2015      PT End of Session - 01/29/15 0915    Visit Number 5   Number of Visits 24   Date for PT Re-Evaluation 03/29/15   PT Start Time 0830   PT Stop Time 0932   PT Time Calculation (min) 62 min   Activity Tolerance Patient tolerated treatment well   Behavior During Therapy [facility] for tasks assessed/performed      Past Medical History  Diagnosis Date  . Hypertension     takes Amlodipine and Quinapril daily  . Hyperlipidemia     was on Lipitor but was taken off 3-59yrs ago by medical md bc running liver enzymes up  . Arthritis   . Joint pain   . Joint swelling   . Chronic back pain     deteriorating  . Anemia     no on any meds    Past Surgical History  Procedure Laterality Date  . Arthroscopic knee surgery Right   . Colonoscopy    . Knee arthroscopy Left 02/24/2014    Procedure: LEFT KNEE ARTHROSCOPY WITH PARTIAL MEDIAL MENISCECTOMY;  Surgeon: Mcarthur Rossetti, MD;  Location: [place];  Service: Orthopedics;  Laterality: Left;  . Inguinal hernia repair Bilateral     x 4  . Joint replacement Right 6 yrs ago    knee  . Total knee arthroplasty Left 12/22/2014    Procedure: LEFT TOTAL KNEE ARTHROPLASTY;  Surgeon: Mcarthur Rossetti, MD;  Location: [place];  Service: Orthopedics;  Laterality: Left;    There were no vitals filed for this visit.  Visit Diagnosis:  Pain, joint, knee, left  Edema  Weakness of left lower extremity      Subjective Assessment - 01/29/15 0833    Subjective Sore  and haven't slept well.    Patient is accompained by: Family member   Currently in Pain? Yes   Pain Score 6    Multiple Pain Sites No                        [facility] Adult PT Treatment/Exercise - 01/29/15 0832    Knee/Hip Exercises: Aerobic   Stationary Bike Nu Step L5 x 8 min   Knee/Hip Exercises: Seated   Long Arc Quad Strengthening;Left;1 set;15 reps   Long Arc Quad Weight 3 lbs.   Knee/Hip Exercises: Supine   Quad Sets 10 reps;AROM;Strengthening;Left   Other Supine Knee Exercises legs on ball flexion and extension   Knee/Hip Exercises: Prone   Hamstring Curl 1 set;15 reps   Hamstring Curl Limitations 3 pounds   Straight Leg Raises Strengthening;Left;1 set  30 reps   Straight Leg Raises Limitations 3 pounds   Cryotherapy   Number Minutes Cryotherapy 15 Minutes   Cryotherapy Location Knee   Type of Cryotherapy --  game ready   Manual Therapy   Massage STW with retro massage and patella mobs and scar tissue mobs. He stated his leg felt pretty good after                  PT Short Term Goals - 01/20/15 1539    PT SHORT TERM GOAL #1   Title Independent wiht intial HEp   Time  2   Period Weeks   Status Achieved   PT SHORT TERM GOAL #2   Title Active LT knee extension to -10 degrees for improved stability with walking   Time 4   Period Weeks   Status On-going   PT SHORT TERM GOAL #3   Time 4   Period Weeks   Status On-going   PT SHORT TERM GOAL #4   Title Decreased edema to 48 cm Lt knee   Time 4   Period Weeks   Status Unable to assess           PT Long Term Goals - 01/18/15 0959    PT LONG TERM GOAL #1   Title independent with all HEP issued as of last visit   Time 10   Period Weeks   Status New   PT LONG TERM GOAL #2   Title walk without device in and out of home   Time 10   Period Weeks   Status New   PT LONG TERM GOAL #3   Title Improve active LT knee flexion to 120 degrees to allow return to gardening with squatting and mowing grass   Time 10   Period Weeks   Status New   PT LONG TERM GOAL #4   Title Active LT knee exteniosn to -5 degrees to demo improved  strength and knee stability with walking.    PT LONG TERM GOAL #5   Title Edema reduced to 46 CM to allow for increased LT knee flexion   Time 10   Period Weeks   Status New   Additional Long Term Goals   Additional Long Term Goals Yes   PT LONG TERM GOAL #6   Title He will be able to walk and stand for 45 min on more for home/yard tasks   Time 10   Period Weeks   Status New               Plan - 01/29/15 8182    Clinical Impression Statement Limited with range today as I felt he would be better served with pain reduction and strengthening today. He appeared to be more comfortable post session   PT Next Visit Plan Contnue manual and cold. Try some closed chain exercises. Strengthening, closed chain exer   PT Home Exercise Plan same as previous   Consulted and Agree with Plan of Care Patient        Problem List Patient Active Problem List   Diagnosis Date Noted  . Osteoarthritis of left knee 12/22/2014  . Status post total left knee replacement 12/22/2014  . Acute medial meniscus tear of left knee 02/24/2014    Darrel Hoover PT 01/29/2015, 9:19 AM  [facility] [address] [place], [place], [address] Phone: [phone]   Fax:  [phone]

## 2015-02-01 ENCOUNTER — Ambulatory Visit: Payer: Medicare Other

## 2015-02-01 DIAGNOSIS — R609 Edema, unspecified: Secondary | ICD-10-CM

## 2015-02-01 DIAGNOSIS — R29898 Other symptoms and signs involving the musculoskeletal system: Secondary | ICD-10-CM | POA: Diagnosis not present

## 2015-02-01 DIAGNOSIS — M25562 Pain in left knee: Secondary | ICD-10-CM

## 2015-02-01 DIAGNOSIS — R262 Difficulty in walking, not elsewhere classified: Secondary | ICD-10-CM | POA: Diagnosis not present

## 2015-02-01 DIAGNOSIS — M25662 Stiffness of left knee, not elsewhere classified: Secondary | ICD-10-CM

## 2015-02-01 NOTE — Therapy (Signed)
[place], [place], [address] Phone: [phone]   Fax:  [phone]  Physical Therapy Treatment  Patient Details  Name: Ryan Craig MRN: [id number] Date of Birth: May 14, 1946 Referring Provider:  Glendale Chard, MD  Encounter Date: 02/01/2015      PT End of Session - 02/01/15 0916    Visit Number 6   Date for PT Re-Evaluation 03/29/15   PT Start Time 0830   PT Stop Time 0930   PT Time Calculation (min) 60 min   Activity Tolerance Patient tolerated treatment well   Behavior During Therapy [facility] for tasks assessed/performed      Past Medical History  Diagnosis Date  . Hypertension     takes Amlodipine and Quinapril daily  . Hyperlipidemia     was on Lipitor but was taken off 3-71yrs ago by medical md bc running liver enzymes up  . Arthritis   . Joint pain   . Joint swelling   . Chronic back pain     deteriorating  . Anemia     no on any meds    Past Surgical History  Procedure Laterality Date  . Arthroscopic knee surgery Right   . Colonoscopy    . Knee arthroscopy Left 02/24/2014    Procedure: LEFT KNEE ARTHROSCOPY WITH PARTIAL MEDIAL MENISCECTOMY;  Surgeon: Mcarthur Rossetti, MD;  Location: [place];  Service: Orthopedics;  Laterality: Left;  . Inguinal hernia repair Bilateral     x 4  . Joint replacement Right 6 yrs ago    knee  . Total knee arthroplasty Left 12/22/2014    Procedure: LEFT TOTAL KNEE ARTHROPLASTY;  Surgeon: Mcarthur Rossetti, MD;  Location: [place];  Service: Orthopedics;  Laterality: Left;    There were no vitals filed for this visit.  Visit Diagnosis:  Pain, joint, knee, left  Edema  Weakness of left lower extremity  Difficulty walking  Joint stiffness of left lower leg      Subjective Assessment - 02/01/15 0845    Subjective Doing ok today. Won't win any races.    Patient is accompained by: Family member   Currently in Pain? Yes   Pain Score 4    Multiple Pain Sites No                       [facility] Adult PT Treatment/Exercise - 02/01/15 0851    Knee/Hip Exercises: Aerobic   Stationary Bike Worked on ROM with bike x 8 min   Knee/Hip Exercises: Standing   Wall Squat 15 reps;1 set;5 seconds   Other Standing Knee Exercises single leg hip hinge x 8 RT and LT , sidestepping LT andRT 430 feet x 3 each. RT and LT.    Knee/Hip Exercises: Supine   Other Supine Knee Exercises LE extension  and flexion x 20  with legs on orange ball   Cryotherapy   Number Minutes Cryotherapy 15 Minutes   Cryotherapy Location Knee   Type of Cryotherapy --  Game ready   Manual Therapy   Manual Therapy Passive ROM   Joint Mobilization patella mobs all directions and with AP glides for flexion Gr 3-4   Passive ROM Extension range with lower leg on ball x 50 reps 5-10 sec each and withQS x 50 reps                  PT Short Term Goals - 01/20/15 1539    PT SHORT TERM  GOAL #1   Title Independent wiht intial HEp   Time 2   Period Weeks   Status Achieved   PT SHORT TERM GOAL #2   Title Active LT knee extension to -10 degrees for improved stability with walking   Time 4   Period Weeks   Status On-going   PT SHORT TERM GOAL #3   Time 4   Period Weeks   Status On-going   PT SHORT TERM GOAL #4   Title Decreased edema to 48 cm Lt knee   Time 4   Period Weeks   Status Unable to assess           PT Long Term Goals - 01/18/15 0959    PT LONG TERM GOAL #1   Title independent with all HEP issued as of last visit   Time 10   Period Weeks   Status New   PT LONG TERM GOAL #2   Title walk without device in and out of home   Time 10   Period Weeks   Status New   PT LONG TERM GOAL #3   Title Improve active LT knee flexion to 120 degrees to allow return to gardening with squatting and mowing grass   Time 10   Period Weeks   Status New   PT LONG TERM GOAL #4   Title Active LT knee exteniosn to -5 degrees to demo improved  strength and knee stability with walking.    PT LONG TERM GOAL #5   Title Edema reduced to 46 CM to allow for increased LT knee flexion   Time 10   Period Weeks   Status New   Additional Long Term Goals   Additional Long Term Goals Yes   PT LONG TERM GOAL #6   Title He will be able to walk and stand for 45 min on more for home/yard tasks   Time 10   Period Weeks   Status New               Plan - 02/01/15 0916    Clinical Impression Statement Less pain to start today so did not do heavy stetching . Extension passive -5 or better and flexion 100 degree s or better.    PT Next Visit Plan Continue closed chain exercises, game ready and manual . Measure edema dna ROM   PT Home Exercise Plan Add closed chain exercises next visit   Consulted and Agree with Plan of Care Patient;Family member/caregiver        Problem List Patient Active Problem List   Diagnosis Date Noted  . Osteoarthritis of left knee 12/22/2014  . Status post total left knee replacement 12/22/2014  . Acute medial meniscus tear of left knee 02/24/2014    Darrel Hoover PT 02/01/2015, 9:19 AM  [facility] [address] [place], [place], [address] Phone: [phone]   Fax:  [phone]

## 2015-02-03 ENCOUNTER — Ambulatory Visit: Payer: Medicare Other

## 2015-02-03 DIAGNOSIS — R609 Edema, unspecified: Secondary | ICD-10-CM

## 2015-02-03 DIAGNOSIS — R262 Difficulty in walking, not elsewhere classified: Secondary | ICD-10-CM

## 2015-02-03 DIAGNOSIS — R29898 Other symptoms and signs involving the musculoskeletal system: Secondary | ICD-10-CM | POA: Diagnosis not present

## 2015-02-03 DIAGNOSIS — M25662 Stiffness of left knee, not elsewhere classified: Secondary | ICD-10-CM

## 2015-02-03 DIAGNOSIS — M25562 Pain in left knee: Secondary | ICD-10-CM

## 2015-02-03 NOTE — Therapy (Signed)
[place], [place], [address] Phone: [phone]   Fax:  [phone]  Physical Therapy Treatment  Patient Details  Name: Ryan Craig MRN: [id number] Date of Birth: [birth year] Referring Provider:  Glendale Chard, MD  Encounter Date: 02/03/2015      PT End of Session - 02/03/15 0917    Visit Number 7   Number of Visits 24   Date for PT Re-Evaluation 03/29/15   PT Start Time 0845   PT Stop Time 0930   PT Time Calculation (min) 45 min   Activity Tolerance Patient tolerated treatment well   Behavior During Therapy [facility] for tasks assessed/performed      Past Medical History  Diagnosis Date  . Hypertension     takes Amlodipine and Quinapril daily  . Hyperlipidemia     was on Lipitor but was taken off 3-5yrs ago by medical md bc running liver enzymes up  . Arthritis   . Joint pain   . Joint swelling   . Chronic back pain     deteriorating  . Anemia     no on any meds    Past Surgical History  Procedure Laterality Date  . Arthroscopic knee surgery Right   . Colonoscopy    . Knee arthroscopy Left 02/24/2014    Procedure: LEFT KNEE ARTHROSCOPY WITH PARTIAL MEDIAL MENISCECTOMY;  Surgeon: Mcarthur Rossetti, MD;  Location: [place];  Service: Orthopedics;  Laterality: Left;  . Inguinal hernia repair Bilateral     x 4  . Joint replacement Right 6 yrs ago    knee  . Total knee arthroplasty Left 12/22/2014    Procedure: LEFT TOTAL KNEE ARTHROPLASTY;  Surgeon: Mcarthur Rossetti, MD;  Location: [place];  Service: Orthopedics;  Laterality: Left;    There were no vitals filed for this visit.  Visit Diagnosis:  Pain, joint, knee, left  Edema  Weakness of left lower extremity  Difficulty walking  Joint stiffness of left lower leg      Subjective Assessment - 02/03/15 0917    Subjective Doing ok today . stiff   Currently in Pain? Yes   Pain Score 3    Multiple Pain Sites No                        [facility] Adult PT Treatment/Exercise - 02/03/15 0857    Transfers   Transfers Sit to Stand;Stand to Sit   Sit to Stand 6: Modified independent (Device/Increase time)  10 reps   Stand to Sit 6: Modified independent (Device/Increase time)  10 reps   Knee/Hip Exercises: Aerobic   Stationary Bike Nustep L4 10 min LE only   Knee/Hip Exercises: Standing   Lateral Step Up Left;15 reps;Hand Hold: 1;Step Height: 4";1 set   Knee/Hip Exercises: Seated   Long Arc Quad 1 set;Strengthening;Left  25 reps   Long Arc Quad Weight 5 lbs.   Knee/Hip Exercises: Supine   Quad Sets AROM;Strengthening;Left;15 reps  10 sec hold   Knee/Hip Exercises: Prone   Hamstring Curl 1 set;15 reps   Hamstring Curl Limitations 5 pounds   Cryotherapy   Number Minutes Cryotherapy 12 Minutes   Cryotherapy Location Knee   Type of Cryotherapy --  game ready   Manual Therapy   Passive ROM flexion stretching i n prone with active flexion 108 degrees post                  PT Short  Term Goals - 01/20/15 1539    PT SHORT TERM GOAL #1   Title Independent wiht intial HEp   Time 2   Period Weeks   Status Achieved   PT SHORT TERM GOAL #2   Title Active LT knee extension to -10 degrees for improved stability with walking   Time 4   Period Weeks   Status On-going   PT SHORT TERM GOAL #3   Time 4   Period Weeks   Status On-going   PT SHORT TERM GOAL #4   Title Decreased edema to 48 cm Lt knee   Time 4   Period Weeks   Status Unable to assess           PT Long Term Goals - 01/18/15 0959    PT LONG TERM GOAL #1   Title independent with all HEP issued as of last visit   Time 10   Period Weeks   Status New   PT LONG TERM GOAL #2   Title walk without device in and out of home   Time 10   Period Weeks   Status New   PT LONG TERM GOAL #3   Title Improve active LT knee flexion to 120 degrees to allow return to gardening with squatting and mowing grass   Time 10    Period Weeks   Status New   PT LONG TERM GOAL #4   Title Active LT knee exteniosn to -5 degrees to demo improved strength and knee stability with walking.    PT LONG TERM GOAL #5   Title Edema reduced to 46 CM to allow for increased LT knee flexion   Time 10   Period Weeks   Status New   Additional Long Term Goals   Additional Long Term Goals Yes   PT LONG TERM GOAL #6   Title He will be able to walk and stand for 45 min on more for home/yard tasks   Time 10   Period Weeks   Status New               Plan - 02/03/15 0917    Clinical Impression Statement Active flexion range improved and pain less. Progressing toward goals   PT Next Visit Plan Continue closed chain exercises, game ready and manual . Measure edema dna ROM   Consulted and Agree with Plan of Care Patient        Problem List Patient Active Problem List   Diagnosis Date Noted  . Osteoarthritis of left knee 12/22/2014  . Status post total left knee replacement 12/22/2014  . Acute medial meniscus tear of left knee 02/24/2014    Darrel Hoover PT 02/03/2015, 9:19 AM  [facility] [address] [place], [place], [address] Phone: [phone]   Fax:  [phone]

## 2015-02-05 ENCOUNTER — Ambulatory Visit: Payer: Medicare Other

## 2015-02-05 DIAGNOSIS — M25562 Pain in left knee: Secondary | ICD-10-CM

## 2015-02-05 DIAGNOSIS — R609 Edema, unspecified: Secondary | ICD-10-CM

## 2015-02-05 DIAGNOSIS — R262 Difficulty in walking, not elsewhere classified: Secondary | ICD-10-CM | POA: Diagnosis not present

## 2015-02-05 DIAGNOSIS — R29898 Other symptoms and signs involving the musculoskeletal system: Secondary | ICD-10-CM | POA: Diagnosis not present

## 2015-02-05 DIAGNOSIS — M25662 Stiffness of left knee, not elsewhere classified: Secondary | ICD-10-CM

## 2015-02-05 NOTE — Patient Instructions (Signed)
Asked spouse to pressure knee in to extension at home for 1-2 min 3x/day.

## 2015-02-05 NOTE — Therapy (Signed)
[place], [place], [address] Phone: [phone]   Fax:  [phone]  Physical Therapy Treatment  Patient Details  Name: Ryan Craig MRN: [id number] Date of Birth: 1946/08/01 Referring Provider:  Glendale Chard, MD  Encounter Date: 02/05/2015      PT End of Session - 02/05/15 4196    Activity Tolerance Patient tolerated treatment well   Behavior During Therapy [facility] for tasks assessed/performed      Past Medical History  Diagnosis Date  . Hypertension     takes Amlodipine and Quinapril daily  . Hyperlipidemia     was on Lipitor but was taken off 3-32yrs ago by medical md bc running liver enzymes up  . Arthritis   . Joint pain   . Joint swelling   . Chronic back pain     deteriorating  . Anemia     no on any meds    Past Surgical History  Procedure Laterality Date  . Arthroscopic knee surgery Right   . Colonoscopy    . Knee arthroscopy Left 02/24/2014    Procedure: LEFT KNEE ARTHROSCOPY WITH PARTIAL MEDIAL MENISCECTOMY;  Surgeon: Mcarthur Rossetti, MD;  Location: [place];  Service: Orthopedics;  Laterality: Left;  . Inguinal hernia repair Bilateral     x 4  . Joint replacement Right 6 yrs ago    knee  . Total knee arthroplasty Left 12/22/2014    Procedure: LEFT TOTAL KNEE ARTHROPLASTY;  Surgeon: Mcarthur Rossetti, MD;  Location: [place];  Service: Orthopedics;  Laterality: Left;    There were no vitals filed for this visit.  Visit Diagnosis:  Edema  Pain, joint, knee, left  Joint stiffness of left lower leg          [facility] PT Assessment - 02/05/15 0854    AROM   Left Knee Extension -12   Left Knee Flexion 112   PROM   Left Knee Extension 0   Left Knee Flexion 115                   [facility] Adult PT Treatment/Exercise - 02/05/15 0851    Knee/Hip Exercises: Aerobic   Stationary Bike ustep L4 11 min LE only   Cryotherapy   Number Minutes Cryotherapy 15 Minutes   Cryotherapy Location Knee   Type of Cryotherapy --  game ready   Manual Therapy   Joint Mobilization patella mobs all directions and with AP glides for flexion Gr 3-4   Passive ROM flexion stretching in prone with oscilations at end range to tolerance with int tib rotation                PT Education - 02/05/15 0923    Education provided Yes   Education Details extension stretch   Person(s) Educated Patient;Spouse   Methods Explanation;Demonstration;Verbal cues   Comprehension Returned demonstration          PT Short Term Goals - 01/20/15 1539    PT SHORT TERM GOAL #1   Title Independent wiht intial HEp   Time 2   Period Weeks   Status Achieved   PT SHORT TERM GOAL #2   Title Active LT knee extension to -10 degrees for improved stability with walking   Time 4   Period Weeks   Status On-going   PT SHORT TERM GOAL #3   Time 4   Period Weeks   Status On-going   PT SHORT TERM GOAL #4   Title  Decreased edema to 48 cm Lt knee   Time 4   Period Weeks   Status Unable to assess           PT Long Term Goals - 01/18/15 0959    PT LONG TERM GOAL #1   Title independent with all HEP issued as of last visit   Time 10   Period Weeks   Status New   PT LONG TERM GOAL #2   Title walk without device in and out of home   Time 10   Period Weeks   Status New   PT LONG TERM GOAL #3   Title Improve active LT knee flexion to 120 degrees to allow return to gardening with squatting and mowing grass   Time 10   Period Weeks   Status New   PT LONG TERM GOAL #4   Title Active LT knee exteniosn to -5 degrees to demo improved strength and knee stability with walking.    PT LONG TERM GOAL #5   Title Edema reduced to 46 CM to allow for increased LT knee flexion   Time 10   Period Weeks   Status New   Additional Long Term Goals   Additional Long Term Goals Yes   PT LONG TERM GOAL #6   Title He will be able to walk and stand for 45 min on more for home/yard tasks   Time 10    Period Weeks   Status New               Plan - 02/05/15 3704    Clinical Impression Statement Range continues to progress  with end range pain. Cont with edema. Progressing toward goals   PT Next Visit Plan Continue closed chain exercises, game ready and manual . Measure edema and ROM   Consulted and Agree with Plan of Care Patient        Problem List Patient Active Problem List   Diagnosis Date Noted  . Osteoarthritis of left knee 12/22/2014  . Status post total left knee replacement 12/22/2014  . Acute medial meniscus tear of left knee 02/24/2014    Darrel Hoover PT 02/05/2015, 10:07 AM  [facility] [address] [place], [place], [address] Phone: [phone]   Fax:  [phone]

## 2015-02-08 ENCOUNTER — Ambulatory Visit: Payer: Medicare Other

## 2015-02-08 DIAGNOSIS — R29898 Other symptoms and signs involving the musculoskeletal system: Secondary | ICD-10-CM

## 2015-02-08 DIAGNOSIS — M25562 Pain in left knee: Secondary | ICD-10-CM | POA: Diagnosis not present

## 2015-02-08 DIAGNOSIS — R262 Difficulty in walking, not elsewhere classified: Secondary | ICD-10-CM | POA: Diagnosis not present

## 2015-02-08 DIAGNOSIS — R609 Edema, unspecified: Secondary | ICD-10-CM | POA: Diagnosis not present

## 2015-02-08 DIAGNOSIS — M25662 Stiffness of left knee, not elsewhere classified: Secondary | ICD-10-CM

## 2015-02-08 NOTE — Patient Instructions (Signed)
Strengthening: Wall Slide   Leaning on wall, slowly lower buttocks until thighs are parallel to floor. Hold __5-10__ seconds. Tighten thigh muscles and return. Repeat _15-25___ times per set. Do _1-2___ sets per session. Do _1-2Gluteus Medius Strength: Wall Push   Bend uninvolved knee up and press against wall. Bend involved knee slightly and squeeze buttocks. Repeat _5-15___ times or for ____ minutes. Do __1-2__ sessions per day.HOLD 5-10 SEC  http://cc.exer.us/9   Copyright  VHI. All rights reserved.  Gluteus Medius Strength: Wall Push   Bend uninvolved knee up and press against wall. Bend involved knee slightly and squeeze buttocks. Repeat ____ times or for ____ minutes. Do ____ sessions per day.  http://cc.exer.us/9   Copyright  VHI. All rights reserved.  ___ sessions per day.  http://orth.exer.us/630   Copyright  VHI. All rights reserved.

## 2015-02-08 NOTE — Therapy (Signed)
[place], [place], [address] Phone: [phone]   Fax:  [phone]  Physical Therapy Treatment  Patient Details  Name: Ryan Craig MRN: [id number] Date of Birth: 05-22-1946 Referring Provider:  Glendale Chard, MD  Encounter Date: 02/08/2015      PT End of Session - 02/08/15 1333    Visit Number 9   Number of Visits 24   Date for PT Re-Evaluation 03/29/15   PT Start Time 0125   PT Stop Time 0215   PT Time Calculation (min) 50 min   Activity Tolerance Patient tolerated treatment well   Behavior During Therapy [facility] for tasks assessed/performed      Past Medical History  Diagnosis Date  . Hypertension     takes Amlodipine and Quinapril daily  . Hyperlipidemia     was on Lipitor but was taken off 3-8yr ago by medical md bc running liver enzymes up  . Arthritis   . Joint pain   . Joint swelling   . Chronic back pain     deteriorating  . Anemia     no on any meds    Past Surgical History  Procedure Laterality Date  . Arthroscopic knee surgery Right   . Colonoscopy    . Knee arthroscopy Left 02/24/2014    Procedure: LEFT KNEE ARTHROSCOPY WITH PARTIAL MEDIAL MENISCECTOMY;  Surgeon: CMcarthur Rossetti MD;  [place]  Service: Orthopedics;  Laterality: Left;  . Inguinal hernia repair Bilateral     x 4  . Joint replacement Right 6 yrs ago    knee  . Total knee arthroplasty Left 12/22/2014    Procedure: LEFT TOTAL KNEE ARTHROPLASTY;  Surgeon: CMcarthur Rossetti MD;  [place]  Service: Orthopedics;  Laterality: Left;    There were no vitals filed for this visit.  Visit Diagnosis:  Edema  Joint stiffness of left lower leg  Weakness of left lower extremity      Subjective Assessment - 02/08/15 1327    Subjective Leg doing good. One problem. Back went out for a day.  MD said this was last week of PT as he is doing well. Back OK now. .     Currently in Pain? Yes   Pain  Score 3   today   Multiple Pain Sites No                         [facility] Adult PT Treatment/Exercise - 02/08/15 1330    Knee/Hip Exercises: Aerobic   Stationary Bike Nustep L3 8 min LE only   Knee/Hip Exercises: Standing   Lateral Step Up Left;1 set;20 reps;Hand Hold: 1   Wall Squat 1 set;15 reps;5 seconds  isssued for HEP   Other Standing Knee Exercises single leg hip abduciton x10 reps RT and LT 3-5 sec    Knee/Hip Exercises: Seated   Long Arc Quad Strengthening;Left;1 set  30 reps   Long Arc Quad Weight 6 lbs.   Knee/Hip Exercises: Supine   Bridges Strengthening;Both;1 set;15 reps   Manual Therapy   Passive ROM extension stretching and patella mobs.                 PT Education - 02/08/15 1351    Education provided Yes   Education Details closed chain exercise.   Person(s) Educated Patient;Spouse   Methods Explanation;Demonstration;Handout;Verbal cues   Comprehension Returned demonstration          PT  Short Term Goals - 02/08/15 1334    PT SHORT TERM GOAL #2   Title Active LT knee extension to -10 degrees for improved stability with walking   Status On-going   PT SHORT TERM GOAL #3   Title Active LT knee flexion to 110 degrees for improved gait   Status Achieved   PT SHORT TERM GOAL #4   Title Decreased edema to 48 cm Lt knee  49 cm today   Status On-going           PT Long Term Goals - 02/08/15 1338    PT LONG TERM GOAL #2   Title walk without device in and out of home  walking with cand in community for safety/balance   Status Partially Met   PT LONG TERM GOAL #3   Title Improve active LT knee flexion to 120 degrees to allow return to gardening with squatting and mowing grass  112   Status Partially Met   PT LONG TERM GOAL #4   Title Active LT knee exteniosn to -5 degrees to demo improved strength and knee stability with walking.   -15   Status On-going   PT LONG TERM GOAL #5   Title Edema reduced to 46 CM to allow for  increased LT knee flexion   Status On-going   PT LONG TERM GOAL #6   Title He will be able to walk and stand for 45 min on more for home/yard tasks   Status Achieved               Plan - 02/08/15 1334    Clinical Impression Statement Finish after this week per MD.   PT Next Visit Plan Continue closed chain exercises, game ready and manual . Measure edema and ROM   Consulted and Agree with Plan of Care Patient        Problem List Patient Active Problem List   Diagnosis Date Noted  . Osteoarthritis of left knee 12/22/2014  . Status post total left knee replacement 12/22/2014  . Acute medial meniscus tear of left knee 02/24/2014    Darrel Hoover PT 02/08/2015, 2:12 PM  [facility] [address] [place], [place], [address] Phone: [phone]   Fax:  [phone]

## 2015-02-10 ENCOUNTER — Ambulatory Visit: Payer: Medicare Other

## 2015-02-10 DIAGNOSIS — R262 Difficulty in walking, not elsewhere classified: Secondary | ICD-10-CM | POA: Diagnosis not present

## 2015-02-10 DIAGNOSIS — M25562 Pain in left knee: Secondary | ICD-10-CM | POA: Diagnosis not present

## 2015-02-10 DIAGNOSIS — R29898 Other symptoms and signs involving the musculoskeletal system: Secondary | ICD-10-CM | POA: Diagnosis not present

## 2015-02-10 DIAGNOSIS — R609 Edema, unspecified: Secondary | ICD-10-CM

## 2015-02-10 NOTE — Therapy (Signed)
[place], [place], [address] Phone: [phone]   Fax:  [phone]  Physical Therapy Treatment  Patient Details  Name: Ryan Craig MRN: [id number] Date of Birth: May 28, 1946 Referring Provider:  Glendale Chard, MD  Encounter Date: 02/10/2015      PT End of Session - 02/10/15 0913    Visit Number 10   Number of Visits 24   Date for PT Re-Evaluation 03/29/15   PT Start Time 0835   PT Stop Time 0930   PT Time Calculation (min) 55 min   Activity Tolerance Patient tolerated treatment well   Behavior During Therapy [facility] for tasks assessed/performed      Past Medical History  Diagnosis Date  . Hypertension     takes Amlodipine and Quinapril daily  . Hyperlipidemia     was on Lipitor but was taken off 3-5yr ago by medical md bc running liver enzymes up  . Arthritis   . Joint pain   . Joint swelling   . Chronic back pain     deteriorating  . Anemia     no on any meds    Past Surgical History  Procedure Laterality Date  . Arthroscopic knee surgery Right   . Colonoscopy    . Knee arthroscopy Left 02/24/2014    Procedure: LEFT KNEE ARTHROSCOPY WITH PARTIAL MEDIAL MENISCECTOMY;  Surgeon: CMcarthur Rossetti MD;  [place]  Service: Orthopedics;  Laterality: Left;  . Inguinal hernia repair Bilateral     x 4  . Joint replacement Right 6 yrs ago    knee  . Total knee arthroplasty Left 12/22/2014    Procedure: LEFT TOTAL KNEE ARTHROPLASTY;  Surgeon: CMcarthur Rossetti MD;  [place]  Service: Orthopedics;  Laterality: Left;    There were no vitals filed for this visit.  Visit Diagnosis:  Edema  Weakness of left lower extremity  Pain, joint, knee, left  Difficulty walking      Subjective Assessment - 02/10/15 0850    Subjective Doing OK . Discharge end of week.                          [facility] PT Treatment/Exercise - 02/10/15 0850    Knee/Hip  Exercises: Aerobic   Stationary Bike Nustep L3 8 min LE only   Knee/Hip Exercises: Standing   Lateral Step Up Left;1 set;20 reps;Hand Hold: 1;Step Height: 8"   Wall Squat 1 set;15 reps;5 seconds   Wall Squat Limitations also did TKE at wall after wall squat x15   Other Standing Knee Exercises single leg hip abduciton x10 reps RT and LT 3-5 sec    Knee/Hip Exercises: Seated   Long Arc Quad Strengthening;Left;1 set   LIllinois Tool WorksWeight 8 lbs.  40 reps and 5 sec hold   Knee/Hip Exercises: Supine   Bridges Strengthening;Both;1 set;10 reps   Bridges Limitations Also did 10 reps  single leg hip lifts RT and LT    Cryotherapy   Number Minutes Cryotherapy 15 Minutes   Cryotherapy Location Knee   Type of Cryotherapy --  game ready                  PT Short Term Goals - 02/08/15 1334    PT SHORT TERM GOAL #2   Title Active LT knee extension to -10 degrees for improved stability with walking   Status On-going   PT SHORT TERM GOAL #  3   Title Active LT knee flexion to 110 degrees for improved gait   Status Achieved   PT SHORT TERM GOAL #4   Title Decreased edema to 48 cm Lt knee  49 cm today   Status On-going           PT Long Term Goals - 02/08/15 1338    PT LONG TERM GOAL #2   Title walk without device in and out of home  walking with cand in community for safety/balance   Status Partially Met   PT LONG TERM GOAL #3   Title Improve active LT knee flexion to 120 degrees to allow return to gardening with squatting and mowing grass  112   Status Partially Met   PT LONG TERM GOAL #4   Title Active LT knee exteniosn to -5 degrees to demo improved strength and knee stability with walking.   -15   Status On-going   PT LONG TERM GOAL #5   Title Edema reduced to 46 CM to allow for increased LT knee flexion   Status On-going   PT LONG TERM GOAL #6   Title He will be able to walk and stand for 45 min on more for home/yard tasks   Status Achieved                Plan - February 13, 2015 0914    Clinical Impression Statement Doing well. Continue with edema and pain but functionally progressing.    PT Frequency 1x / week   PT Next Visit Plan Discharge next visit   Consulted and Agree with Plan of Care Patient          G-Codes - 2015-02-13 5615    Functional Assessment Tool Used FOTO   Functional Limitation Mobility: Walking and moving around   Mobility: Walking and Moving Around Current Status [phone]) At least 40 percent but less than 60 percent impaired, limited or restricted   Mobility: Walking and Moving Around Goal Status [phone]) At least 40 percent but less than 60 percent impaired, limited or restricted      Problem List Patient Active Problem List   Diagnosis Date Noted  . Osteoarthritis of left knee 12/22/2014  . Status post total left knee replacement 12/22/2014  . Acute medial meniscus tear of left knee 02/24/2014    Darrel Hoover PT Feb 13, 2015, 9:35 AM  [facility] [address] [place], [place], [address] Phone: [phone]   Fax:  [phone]

## 2015-02-12 ENCOUNTER — Ambulatory Visit: Payer: Medicare Other | Admitting: Physical Therapy

## 2015-02-12 DIAGNOSIS — R262 Difficulty in walking, not elsewhere classified: Secondary | ICD-10-CM | POA: Diagnosis not present

## 2015-02-12 DIAGNOSIS — M25662 Stiffness of left knee, not elsewhere classified: Secondary | ICD-10-CM

## 2015-02-12 DIAGNOSIS — R29898 Other symptoms and signs involving the musculoskeletal system: Secondary | ICD-10-CM

## 2015-02-12 DIAGNOSIS — M25562 Pain in left knee: Secondary | ICD-10-CM

## 2015-02-12 DIAGNOSIS — R609 Edema, unspecified: Secondary | ICD-10-CM

## 2015-02-12 NOTE — Patient Instructions (Signed)
   Warrene Kapfer PT, DPT, LAT, ATC  [facility] Phone: [phone]     

## 2015-02-12 NOTE — Therapy (Signed)
[place], [place], [address] Phone: [phone]   Fax:  [phone]  Physical Therapy Treatment/ Discharge summary  Patient Details  Name: Ryan Craig MRN: [id number] Date of Birth: 1946/07/07 Referring Provider:  Glendale Chard, MD  Encounter Date: 02/12/2015      PT End of Session - 02/12/15 0910    Visit Number 11   Number of Visits 24   Date for PT Re-Evaluation 03/29/15   PT Start Time 0845   PT Stop Time 0915   PT Time Calculation (min) 30 min   Activity Tolerance Patient tolerated treatment well   Behavior During Therapy [facility] for tasks assessed/performed      Past Medical History  Diagnosis Date  . Hypertension     takes Amlodipine and Quinapril daily  . Hyperlipidemia     was on Lipitor but was taken off 3-61yr ago by medical md bc running liver enzymes up  . Arthritis   . Joint pain   . Joint swelling   . Chronic back pain     deteriorating  . Anemia     no on any meds    Past Surgical History  Procedure Laterality Date  . Arthroscopic knee surgery Right   . Colonoscopy    . Knee arthroscopy Left 02/24/2014    Procedure: LEFT KNEE ARTHROSCOPY WITH PARTIAL MEDIAL MENISCECTOMY;  Surgeon: CMcarthur Rossetti MD;  [place]  Service: Orthopedics;  Laterality: Left;  . Inguinal hernia repair Bilateral     x 4  . Joint replacement Right 6 yrs ago    knee  . Total knee arthroplasty Left 12/22/2014    Procedure: LEFT TOTAL KNEE ARTHROPLASTY;  Surgeon: CMcarthur Rossetti MD;  [place]  Service: Orthopedics;  Laterality: Left;    There were no vitals filed for this visit.  Visit Diagnosis:  Edema  Weakness of left lower extremity  Pain, joint, knee, left  Difficulty walking  Joint stiffness of left lower leg      Subjective Assessment - 02/12/15 0849    Subjective pt reports that he is doing well, but takes a pain pill before treatment but the pain  isn't what it used to do.    How long can you sit comfortably? all day   How long can you stand comfortably? 45 min   How long can you walk comfortably? 1 hour   Currently in Pain? Yes   Pain Score 3    Pain Location Knee   Pain Orientation Left   Pain Descriptors / Indicators Aching;Sore;Tightness   Pain Type Surgical pain   Pain Onset More than a month ago   Pain Frequency Intermittent   Aggravating Factors  doing alot of exercise in combination and walking   Pain Relieving Factors medication, rest, ice, exercises, massage            [facility] PT Assessment - 02/12/15 0852    AROM   Left Knee Extension -10   Left Knee Flexion 112   PROM   Left Knee Extension 0   Left Knee Flexion 119   Strength   Left Knee Flexion 4+/5   Left Knee Extension 4+/5                             PT Education - 02/12/15 0909    Education provided Yes   Education Details advanced HEP, Discharge from PT.  Person(s) Educated Patient   Methods Explanation   Comprehension Verbalized understanding          PT Short Term Goals - 02/15/15 0857    PT SHORT TERM GOAL #1   Title Independent wiht intial HEp   Baseline Need review of HHPT program   Time 2   Period Weeks   Status Achieved   PT SHORT TERM GOAL #2   Title Active LT knee extension to -10 degrees for improved stability with walking   Time 4   Period Weeks   Status Achieved   PT SHORT TERM GOAL #3   Title Active LT knee flexion to 110 degrees for improved gait   Time 4   Period Weeks   Status Achieved   PT SHORT TERM GOAL #4   Title Decreased edema to 48 cm Lt knee   Time 4   Period Weeks   Status On-going           PT Long Term Goals - 2015-02-15 2774    PT LONG TERM GOAL #1   Title independent with all HEP issued as of last visit   Time 10   Period Weeks   Status Achieved   PT LONG TERM GOAL #2   Title walk without device in and out of home   Time 10   Period Weeks   Status Partially Met    PT LONG TERM GOAL #3   Time 10   Period Weeks   Status Partially Met   PT LONG TERM GOAL #4   Title Active LT knee exteniosn to -5 degrees to demo improved strength and knee stability with walking.    Status Partially Met   PT LONG TERM GOAL #5   Title Edema reduced to 46 CM to allow for increased LT knee flexion   Time 10   Period Weeks   Status Partially Met   PT LONG TERM GOAL #6   Title He will be able to walk and stand for 45 min on more for home/yard tasks   Time 10   Period Weeks   Status Achieved               Plan - 15-Feb-2015 0911    Clinical Impression Statement Ryan Craig has made great progress with decreased L knee pain, AROM to -10 to 112,   and  increased strength to overall 4+/5. Swelling has dropped to 47.5 @ joint line, 10cm above 52.8 cm, and 10 cm below 40cm. He met STG 2 and 4, and partially met LTG 2,3 and 4. He is able to maintain his current level of function and no longer requires skilled physical therapy and will be discharged today.    PT Home Exercise Plan heel raises, tandem balance in corner   Consulted and Agree with Plan of Care Patient;Family member/caregiver   Family Member Consulted wife          G-Codes - 02/15/15 0916    Functional Assessment Tool Used FOTO   Functional Limitation Mobility: Walking and moving around   Mobility: Walking and Moving Around Goal Status [phone]) At least 40 percent but less than 60 percent impaired, limited or restricted   Mobility: Walking and Moving Around Discharge Status [phone]) At least 40 percent but less than 60 percent impaired, limited or restricted      Problem List Patient Active Problem List   Diagnosis Date Noted  . Osteoarthritis of left knee 12/22/2014  . Status post total left knee  replacement 12/22/2014  . Acute medial meniscus tear of left knee 02/24/2014       PHYSICAL THERAPY DISCHARGE SUMMARY  Visits from Start of Care: 11  Current functional level related to goals /  functional outcomes: FOTO 56    Remaining deficits: See goals   Education / Equipment: HEP handout  Plan: Patient agrees to discharge.  Patient goals were partially met. Patient is being discharged due to meeting the stated rehab goals.  ?????        Starr Lake PT, DPT, LAT, ATC  02/12/2015  9:17 AM    [facility] [address] [place], [place], [address] Phone: [phone]   Fax:  [phone]

## 2015-03-29 DIAGNOSIS — Z79899 Other long term (current) drug therapy: Secondary | ICD-10-CM | POA: Diagnosis not present

## 2015-03-29 DIAGNOSIS — Z87891 Personal history of nicotine dependence: Secondary | ICD-10-CM | POA: Diagnosis not present

## 2015-03-29 DIAGNOSIS — I10 Essential (primary) hypertension: Secondary | ICD-10-CM | POA: Diagnosis not present

## 2015-03-29 DIAGNOSIS — M179 Osteoarthritis of knee, unspecified: Secondary | ICD-10-CM | POA: Diagnosis not present

## 2015-07-20 ENCOUNTER — Ambulatory Visit (HOSPITAL_COMMUNITY): Payer: Medicare Other

## 2015-07-20 ENCOUNTER — Other Ambulatory Visit (HOSPITAL_COMMUNITY): Payer: Self-pay | Admitting: Orthopaedic Surgery

## 2015-07-20 DIAGNOSIS — R52 Pain, unspecified: Secondary | ICD-10-CM

## 2015-07-20 DIAGNOSIS — M7989 Other specified soft tissue disorders: Secondary | ICD-10-CM | POA: Diagnosis not present

## 2015-07-20 DIAGNOSIS — Z96652 Presence of left artificial knee joint: Secondary | ICD-10-CM | POA: Diagnosis not present

## 2015-07-21 ENCOUNTER — Ambulatory Visit (HOSPITAL_COMMUNITY)
Admission: RE | Admit: 2015-07-21 | Discharge: 2015-07-21 | Disposition: A | Discharge status: Home / Self Care | Payer: Medicare Other | Source: Ambulatory Visit | Attending: Orthopaedic Surgery | Admitting: Orthopaedic Surgery

## 2015-07-21 DIAGNOSIS — M7989 Other specified soft tissue disorders: Secondary | ICD-10-CM | POA: Diagnosis not present

## 2015-07-21 DIAGNOSIS — R52 Pain, unspecified: Secondary | ICD-10-CM | POA: Diagnosis not present

## 2015-07-21 DIAGNOSIS — I10 Essential (primary) hypertension: Secondary | ICD-10-CM | POA: Insufficient documentation

## 2015-07-21 DIAGNOSIS — E785 Hyperlipidemia, unspecified: Secondary | ICD-10-CM | POA: Insufficient documentation

## 2015-07-21 NOTE — Progress Notes (Signed)
Preliminary results by tech - Bilateral Venous Duplex Lower Ext. Completed. Negative for deep and superficial vein thrombosis in the lower extremities.  Oda Cogan, BS, RDMS, RVT

## 2015-09-13 DIAGNOSIS — M1712 Unilateral primary osteoarthritis, left knee: Secondary | ICD-10-CM | POA: Diagnosis not present

## 2015-09-30 DIAGNOSIS — Z0000 Encounter for general adult medical examination without abnormal findings: Secondary | ICD-10-CM | POA: Diagnosis not present

## 2015-09-30 DIAGNOSIS — R7303 Prediabetes: Secondary | ICD-10-CM | POA: Diagnosis not present

## 2015-09-30 DIAGNOSIS — I10 Essential (primary) hypertension: Secondary | ICD-10-CM | POA: Diagnosis not present

## 2015-09-30 DIAGNOSIS — E784 Other hyperlipidemia: Secondary | ICD-10-CM | POA: Diagnosis not present

## 2015-09-30 DIAGNOSIS — Z79899 Other long term (current) drug therapy: Secondary | ICD-10-CM | POA: Diagnosis not present

## 2015-09-30 DIAGNOSIS — E559 Vitamin D deficiency, unspecified: Secondary | ICD-10-CM | POA: Diagnosis not present

## 2016-01-19 DIAGNOSIS — Z96652 Presence of left artificial knee joint: Secondary | ICD-10-CM | POA: Diagnosis not present

## 2016-01-19 DIAGNOSIS — M25511 Pain in right shoulder: Secondary | ICD-10-CM | POA: Diagnosis not present

## 2016-02-15 DIAGNOSIS — M25411 Effusion, right shoulder: Secondary | ICD-10-CM | POA: Diagnosis not present

## 2016-02-28 DIAGNOSIS — M25511 Pain in right shoulder: Secondary | ICD-10-CM | POA: Diagnosis not present

## 2016-03-30 DIAGNOSIS — I10 Essential (primary) hypertension: Secondary | ICD-10-CM | POA: Diagnosis not present

## 2016-03-30 DIAGNOSIS — R350 Frequency of micturition: Secondary | ICD-10-CM | POA: Diagnosis not present

## 2016-03-30 DIAGNOSIS — Z87891 Personal history of nicotine dependence: Secondary | ICD-10-CM | POA: Diagnosis not present

## 2016-08-30 ENCOUNTER — Other Ambulatory Visit (INDEPENDENT_AMBULATORY_CARE_PROVIDER_SITE_OTHER): Payer: Self-pay | Admitting: Physician Assistant

## 2016-08-30 NOTE — Telephone Encounter (Signed)
Please advise 

## 2016-10-02 DIAGNOSIS — E559 Vitamin D deficiency, unspecified: Secondary | ICD-10-CM | POA: Diagnosis not present

## 2016-10-02 DIAGNOSIS — Z87891 Personal history of nicotine dependence: Secondary | ICD-10-CM | POA: Diagnosis not present

## 2016-10-02 DIAGNOSIS — I10 Essential (primary) hypertension: Secondary | ICD-10-CM | POA: Diagnosis not present

## 2016-10-02 DIAGNOSIS — Z1211 Encounter for screening for malignant neoplasm of colon: Secondary | ICD-10-CM | POA: Diagnosis not present

## 2016-10-02 DIAGNOSIS — Z0000 Encounter for general adult medical examination without abnormal findings: Secondary | ICD-10-CM | POA: Diagnosis not present

## 2016-12-18 ENCOUNTER — Other Ambulatory Visit (INDEPENDENT_AMBULATORY_CARE_PROVIDER_SITE_OTHER): Payer: Self-pay | Admitting: Physician Assistant

## 2017-01-03 ENCOUNTER — Encounter (INDEPENDENT_AMBULATORY_CARE_PROVIDER_SITE_OTHER): Payer: Self-pay

## 2017-01-03 ENCOUNTER — Ambulatory Visit (INDEPENDENT_AMBULATORY_CARE_PROVIDER_SITE_OTHER): Payer: Medicare Other | Admitting: Physician Assistant

## 2017-01-03 ENCOUNTER — Encounter (INDEPENDENT_AMBULATORY_CARE_PROVIDER_SITE_OTHER): Payer: Self-pay | Admitting: Physician Assistant

## 2017-01-03 ENCOUNTER — Ambulatory Visit (INDEPENDENT_AMBULATORY_CARE_PROVIDER_SITE_OTHER): Payer: Medicare Other

## 2017-01-03 DIAGNOSIS — M25561 Pain in right knee: Secondary | ICD-10-CM | POA: Diagnosis not present

## 2017-01-03 NOTE — Progress Notes (Signed)
Office Visit Note   Patient: Ryan Craig           Date of Birth: 01/07/1946           MRN: [id number] Visit Date: 01/03/2017              Requested by: Glendale Chard, MD [address] [place], [place] [address] PCP: Maximino Greenland, MD   Assessment & Plan: Visit Diagnoses:  1. Acute pain of right knee     Plan: Work on range of motion of the knee. Alternate heat and ice. Follow-up if he develops any mechanical symptoms and these are reviewed with him. Otherwise follow-up as needed.   Follow-Up Instructions: Return if symptoms worsen or fail to improve.   Orders:  Orders Placed This Encounter  Procedures  . XR KNEE 3 VIEW RIGHT   No orders of the defined types were placed in this encounter.     Procedures: No procedures performed   Clinical Data: No additional findings.   Subjective: Chief Complaint  Patient presents with  . Right Knee - Pain, Injury    Patient here today for right knee pain. Patient slipped and fell on ice 01/02/17.  Now complains of having severe right knee pain and swelling. Pain worse with ambulation.  Reports that the knee swelled after he fell while scraping ice off the car. He's having no mechanical symptoms of the knee at this point in time.   Injury     Review of Systems   Objective: Vital Signs: There were no vitals taken for this visit.  Physical Exam  Ortho Exam Right leg he has full extension he's able to do a straight leg raise. No instability valgus varus stressing he is nontender over the medial lateral joint line. Surgical incisions well-healed no signs of dehiscence or abrasion erythema or effusion. He has excellent range of motion of the knee without pain. Tenderness over the patella tibial tendon. Specialty Comments:  No specialty comments available.  Imaging: Xr Knee 3 View Right  Result Date: 01/03/2017 3 views right knee shows no acute fracture. Total knee arthroplasty components without any  evidence of hardware failure or loosening. Small effusion/hematoma anterior patella tibial region. No bony abnormalities    PMFS History: Patient Active Problem List   Diagnosis Date Noted  . Osteoarthritis of left knee 12/22/2014  . Status post total left knee replacement 12/22/2014  . Acute medial meniscus tear of left knee 02/24/2014   Past Medical History:  Diagnosis Date  . Anemia    no on any meds  . Arthritis   . Chronic back pain    deteriorating  . Hyperlipidemia    was on Lipitor but was taken off 3-73yrs ago by medical md bc running liver enzymes up  . Hypertension    takes Amlodipine and Quinapril daily  . Joint pain   . Joint swelling     No family history on file.  Past Surgical History:  Procedure Laterality Date  . arthroscopic knee surgery Right   . COLONOSCOPY    . INGUINAL HERNIA REPAIR Bilateral    x 4  . JOINT REPLACEMENT Right 6 yrs ago   knee  . KNEE ARTHROSCOPY Left 02/24/2014   Procedure: LEFT KNEE ARTHROSCOPY WITH PARTIAL MEDIAL MENISCECTOMY;  Surgeon: Mcarthur Rossetti, MD;  Location: [place];  Service: Orthopedics;  Laterality: Left;  . TOTAL KNEE ARTHROPLASTY Left 12/22/2014   Procedure: LEFT TOTAL KNEE ARTHROPLASTY;  Surgeon: Mcarthur Rossetti,  MD;  Location: [facility];  Service: Orthopedics;  Laterality: Left;   Social History   Occupational History  . Not on file.   Social History Main Topics  . Smoking status: Former Research scientist (life sciences)  . Smokeless tobacco: Never Used     Comment: quit smoking about 6-7ytrs ago  . Alcohol use No     Comment: drank over 40 years ago  . Drug use: No  . Sexual activity: Yes

## 2017-04-02 DIAGNOSIS — I10 Essential (primary) hypertension: Secondary | ICD-10-CM | POA: Diagnosis not present

## 2017-04-02 DIAGNOSIS — E669 Obesity, unspecified: Secondary | ICD-10-CM | POA: Diagnosis not present

## 2017-04-02 DIAGNOSIS — Z6835 Body mass index (BMI) 35.0-35.9, adult: Secondary | ICD-10-CM | POA: Diagnosis not present

## 2017-05-15 ENCOUNTER — Other Ambulatory Visit: Payer: Self-pay

## 2017-10-08 DIAGNOSIS — E669 Obesity, unspecified: Secondary | ICD-10-CM | POA: Diagnosis not present

## 2017-10-08 DIAGNOSIS — I10 Essential (primary) hypertension: Secondary | ICD-10-CM | POA: Diagnosis not present

## 2017-10-08 DIAGNOSIS — Z1211 Encounter for screening for malignant neoplasm of colon: Secondary | ICD-10-CM | POA: Diagnosis not present

## 2017-10-08 DIAGNOSIS — E559 Vitamin D deficiency, unspecified: Secondary | ICD-10-CM | POA: Diagnosis not present

## 2017-10-08 DIAGNOSIS — Z0000 Encounter for general adult medical examination without abnormal findings: Secondary | ICD-10-CM | POA: Diagnosis not present

## 2017-10-08 DIAGNOSIS — R7303 Prediabetes: Secondary | ICD-10-CM | POA: Diagnosis not present

## 2017-10-08 DIAGNOSIS — Z6835 Body mass index (BMI) 35.0-35.9, adult: Secondary | ICD-10-CM | POA: Diagnosis not present

## 2017-10-08 DIAGNOSIS — R351 Nocturia: Secondary | ICD-10-CM | POA: Diagnosis not present

## 2017-10-27 ENCOUNTER — Ambulatory Visit (HOSPITAL_COMMUNITY)
Admission: EM | Admit: 2017-10-27 | Discharge: 2017-10-27 | Disposition: A | Discharge status: Home / Self Care | Payer: Medicare Other | Attending: Family Medicine | Admitting: Family Medicine

## 2017-10-27 ENCOUNTER — Encounter (HOSPITAL_COMMUNITY): Payer: Self-pay | Admitting: Emergency Medicine

## 2017-10-27 DIAGNOSIS — H9202 Otalgia, left ear: Secondary | ICD-10-CM

## 2017-10-27 DIAGNOSIS — M542 Cervicalgia: Secondary | ICD-10-CM

## 2017-10-27 MED ORDER — FLUTICASONE PROPIONATE 50 MCG/ACT NA SUSP: 1.0000 | Freq: Every day | NASAL | 2 refills | Status: DC

## 2017-10-27 NOTE — ED Provider Notes (Signed)
[facility]    CSN: [id number] Arrival date & time: 10/27/17  1209     History   Chief Complaint Chief Complaint  Patient presents with  . Otalgia    HPI Ryan Craig is a 72 y.o. male.   Suresh presents with his wife with complaints of left neck ear and temple shooting pain which has been intermittent for the past three days but seems to have increased in frequency. Took aleve two days ago but has not taken any since. Without any recent trauma, falls, MVC. States had similar years ago and believes it was an ear infection. Without fevers. Denies URI symptoms. Without dizziness, vision changes, headache, known ill contacts, weakness, confusion. Pain is shooting, when it comes it is 10/10. It is not constant.    ROS per HPI.       Past Medical History:  Diagnosis Date  . Anemia    no on any meds  . Arthritis   . Chronic back pain    deteriorating  . Hyperlipidemia    was on Lipitor but was taken off 3-60yrs ago by medical md bc running liver enzymes up  . Hypertension    takes Amlodipine and Quinapril daily  . Joint pain   . Joint swelling     Patient Active Problem List   Diagnosis Date Noted  . Osteoarthritis of left knee 12/22/2014  . Status post total left knee replacement 12/22/2014  . Acute medial meniscus tear of left knee 02/24/2014    Past Surgical History:  Procedure Laterality Date  . arthroscopic knee surgery Right   . COLONOSCOPY    . INGUINAL HERNIA REPAIR Bilateral    x 4  . JOINT REPLACEMENT Right 6 yrs ago   knee  . KNEE ARTHROSCOPY Left 02/24/2014   Procedure: LEFT KNEE ARTHROSCOPY WITH PARTIAL MEDIAL MENISCECTOMY;  Surgeon: Mcarthur Rossetti, MD;  Location: [place];  Service: Orthopedics;  Laterality: Left;  . TOTAL KNEE ARTHROPLASTY Left 12/22/2014   Procedure: LEFT TOTAL KNEE ARTHROPLASTY;  Surgeon: Mcarthur Rossetti, MD;  Location: [place];  Service: Orthopedics;  Laterality: Left;       Home Medications    Prior  to Admission medications   Medication Sig Start Date End Date Taking? Authorizing Provider  amLODipine (NORVASC) 10 MG tablet Take 10 mg by mouth daily.   Yes [provider]  aspirin EC 81 MG tablet Take 81 mg by mouth daily.   Yes [provider]  Cholecalciferol (VITAMIN D-3 PO) Take 2 tablets by mouth daily.    Yes [provider]  Pyridoxine HCl (VITAMIN B-6 PO) Take 100 mg by mouth daily.    Yes [provider]  quinapril (ACCUPRIL) 40 MG tablet Take 40 mg by mouth at bedtime.   Yes [provider]  fluticasone (FLONASE) 50 MCG/ACT nasal spray Place 1 spray into both nostrils daily. 10/27/17   Zigmund Gottron, NP    Family History History reviewed. No pertinent family history.  Social History Social History   Tobacco Use  . Smoking status: Former Research scientist (life sciences)  . Smokeless tobacco: Never Used  . Tobacco comment: quit smoking about 6-7ytrs ago  Substance Use Topics  . Alcohol use: No    Comment: drank over 40 years ago  . Drug use: No     Allergies   Contrast media [iodinated diagnostic agents] and Latex   Review of Systems Review of Systems   Physical Exam Triage Vital Signs ED Triage Vitals [10/27/17  1247]  Enc Vitals Group     BP (!) 147/82     Pulse Rate 87     Resp 16     Temp 98.4 F (36.9 C)     Temp Source Oral     SpO2 96 %     Weight      Height      Head Circumference      Peak Flow      Pain Score      Pain Loc      Pain Edu?      Excl. in [place]?    No data found.  Updated Vital Signs BP (!) 147/82 (BP Location: Right Arm) Comment: reported to [facility]MA [employer]  Pulse 87   Temp 98.4 F (36.9 C) (Oral)   Resp 16   SpO2 96%   Visual Acuity Right Eye Distance:   Left Eye Distance:   Bilateral Distance:    Right Eye Near:   Left Eye Near:    Bilateral Near:     Physical Exam  Constitutional: He is oriented to person, place, and time. He appears well-developed and well-nourished.  HENT:  Head:  Normocephalic and atraumatic.  Right Ear: Tympanic membrane, external ear and ear canal normal.  Left Ear: Tympanic membrane, external ear and ear canal normal.  Nose: Nose normal. Right sinus exhibits no maxillary sinus tenderness and no frontal sinus tenderness. Left sinus exhibits no maxillary sinus tenderness and no frontal sinus tenderness.  Mouth/Throat: Uvula is midline, oropharynx is clear and moist and mucous membranes are normal.  Without palpable lymphadenopathy or reproducible pain to left neck posterior to ear; left temple non tender; left ear non tender  Eyes: Conjunctivae are normal. Pupils are equal, round, and reactive to light.  Neck: Normal range of motion.  Cardiovascular: Normal rate and regular rhythm.  Pulmonary/Chest: Effort normal and breath sounds normal.  Musculoskeletal: Normal range of motion. He exhibits no tenderness.  Lymphadenopathy:    He has no cervical adenopathy.  Neurological: He is alert and oriented to person, place, and time. No cranial nerve deficit or sensory deficit. Coordination normal.  Skin: Skin is warm and dry.  Vitals reviewed.    UC Treatments / Results  Labs (all labs ordered are listed, but only abnormal results are displayed) Labs Reviewed - No data to display  EKG  EKG Interpretation None       Radiology No results found.  Procedures Procedures (including critical care time)  Medications Ordered in UC Medications - No data to display   Initial Impression / Assessment and Plan / UC Course  I have reviewed the triage vital signs and the nursing notes.  Pertinent labs & imaging results that were available during my care of the patient were reviewed by me and considered in my medical decision making (see chart for details).     Occasional shooting ear/neck/temple pain without acute findings on exam. Without history of trauma, without vision changes. Supportive cares at this time, naproxen and flonase. Return  precautions provided. Patient verbalized understanding and agreeable to plan.  Ambulatory out of clinic without difficulty.    Final Clinical Impressions(s) / UC Diagnoses   Final diagnoses:  Left ear pain  Neck pain    ED Discharge Orders        Ordered    fluticasone (FLONASE) 50 MCG/ACT nasal spray  Daily     10/27/17 1313       Controlled Substance Prescriptions [place] Controlled Substance Registry  consulted? Not Applicable   Zigmund Gottron, NP 10/27/17 1323

## 2017-10-27 NOTE — Discharge Instructions (Signed)
Please continue with naproxen, take twice a day with food. Daily nasal spray has been prescribed.  If your symptoms worsen, develop increased headache, fevers, vision change, confusion, weakness, dizziness please go to the Er. If no improvement of your symptoms in the next 3-5 days please return to be seen or see your primary care provider.

## 2017-10-27 NOTE — ED Triage Notes (Signed)
PT C/O: intermittent left ear pain and left side of neck pain  ONSET: 3-4 days  DENIES: fevers  TAKING MEDS: aleve   A&O x4... NAD... Ambulatory

## 2017-12-17 ENCOUNTER — Ambulatory Visit (HOSPITAL_COMMUNITY)
Admission: EM | Admit: 2017-12-17 | Discharge: 2017-12-17 | Disposition: A | Discharge status: Home / Self Care | Payer: Medicare Other | Attending: Family Medicine | Admitting: Family Medicine

## 2017-12-17 ENCOUNTER — Encounter (HOSPITAL_COMMUNITY): Payer: Self-pay | Admitting: Emergency Medicine

## 2017-12-17 DIAGNOSIS — J0100 Acute maxillary sinusitis, unspecified: Secondary | ICD-10-CM

## 2017-12-17 DIAGNOSIS — I10 Essential (primary) hypertension: Secondary | ICD-10-CM

## 2017-12-17 MED ORDER — AMOXICILLIN-POT CLAVULANATE 875-125 MG PO TABS: 1.0000 | ORAL_TABLET | Freq: Two times a day (BID) | ORAL | 0 refills | Status: DC

## 2017-12-17 NOTE — ED Provider Notes (Signed)
[place]    CSN: [id number] Arrival date & time: 12/17/17  0957     History   Chief Complaint Chief Complaint  Patient presents with  . URI    HPI Ryan Craig is a 72 y.o. male.   72 year old male presents with nasal congestion, cough and chest congestion with dark colored mucus and irritated throat for over 1 week. Also vomited last week due to cough. Denies any fever or diarrhea. Has taken Theraflu and Coricidin with some success. Exposed to possible flu by family over 1 week ago. Has history of HTN, arthritis and anemia and takes medication for HTN daily.    The history is provided by the patient.    Past Medical History:  Diagnosis Date  . Anemia    no on any meds  . Arthritis   . Chronic back pain    deteriorating  . Hyperlipidemia    was on Lipitor but was taken off 3-58yrs ago by medical md bc running liver enzymes up  . Hypertension    takes Amlodipine and Quinapril daily  . Joint pain   . Joint swelling     Patient Active Problem List   Diagnosis Date Noted  . Osteoarthritis of left knee 12/22/2014  . Status post total left knee replacement 12/22/2014  . Acute medial meniscus tear of left knee 02/24/2014    Past Surgical History:  Procedure Laterality Date  . arthroscopic knee surgery Right   . COLONOSCOPY    . INGUINAL HERNIA REPAIR Bilateral    x 4  . JOINT REPLACEMENT Right 6 yrs ago   knee  . KNEE ARTHROSCOPY Left 02/24/2014   Procedure: LEFT KNEE ARTHROSCOPY WITH PARTIAL MEDIAL MENISCECTOMY;  Surgeon: Mcarthur Rossetti, MD;  Location: [facility];  Service: Orthopedics;  Laterality: Left;  . TOTAL KNEE ARTHROPLASTY Left 12/22/2014   Procedure: LEFT TOTAL KNEE ARTHROPLASTY;  Surgeon: Mcarthur Rossetti, MD;  Location: [facility];  Service: Orthopedics;  Laterality: Left;       Home Medications    Prior to Admission medications   Medication Sig Start Date End Date Taking? Authorizing Provider  amLODipine (NORVASC) 10 MG tablet  Take 10 mg by mouth daily.   Yes [provider]  aspirin EC 81 MG tablet Take 81 mg by mouth daily.   Yes [provider]  Cholecalciferol (VITAMIN D-3 PO) Take 2 tablets by mouth daily.    Yes [provider]  fluticasone (FLONASE) 50 MCG/ACT nasal spray Place 1 spray into both nostrils daily. 10/27/17  Yes Burky, Lanelle Bal B, NP  Pyridoxine HCl (VITAMIN B-6 PO) Take 100 mg by mouth daily.    Yes [provider]  amoxicillin-clavulanate (AUGMENTIN) 875-125 MG tablet Take 1 tablet by mouth every 12 (twelve) hours for 7 days. 12/17/17 12/24/17  Katy Apo, NP  quinapril (ACCUPRIL) 40 MG tablet Take 40 mg by mouth at bedtime.    [provider]    Family History No family history on file.  Social History Social History   Tobacco Use  . Smoking status: Former Research scientist (life sciences)  . Smokeless tobacco: Never Used  . Tobacco comment: quit smoking about 6-7ytrs ago  Substance Use Topics  . Alcohol use: No    Comment: drank over 40 years ago  . Drug use: No     Allergies   Contrast media [iodinated diagnostic agents] and Latex   Review of Systems Review of Systems  Constitutional: Positive for appetite change and fatigue.  Negative for activity change, chills and fever.  HENT: Positive for congestion, sinus pressure and sore throat. Negative for ear discharge, ear pain, facial swelling, mouth sores, nosebleeds, rhinorrhea, sinus pain, sneezing and trouble swallowing.   Eyes: Negative for pain, discharge, redness and itching.  Respiratory: Positive for cough. Negative for chest tightness, shortness of breath and wheezing.   Cardiovascular: Negative for chest pain.  Gastrointestinal: Positive for vomiting. Negative for abdominal pain, diarrhea and nausea.  Musculoskeletal: Negative for arthralgias, myalgias, neck pain and neck stiffness.  Skin: Negative for rash and wound.  Neurological: Negative for dizziness, seizures, syncope, weakness, light-headedness  and headaches.  Hematological: Negative for adenopathy. Does not bruise/bleed easily.     Physical Exam Triage Vital Signs ED Triage Vitals  Enc Vitals Group     BP 12/17/17 1009 (!) 158/92     Pulse Rate 12/17/17 1009 78     Resp 12/17/17 1009 16     Temp 12/17/17 1009 98.2 F (36.8 C)     Temp Source 12/17/17 1009 Oral     SpO2 12/17/17 1009 98 %     Weight 12/17/17 1008 218 lb (98.9 kg)     Height --      Head Circumference --      Peak Flow --      Pain Score 12/17/17 1008 0     Pain Loc --      Pain Edu? --      Excl. in [place]? --    No data found.  Updated Vital Signs BP (!) 158/92   Pulse 78   Temp 98.2 F (36.8 C) (Oral)   Resp 16   Wt 218 lb (98.9 kg)   SpO2 98%   BMI 34.14 kg/m   Visual Acuity Right Eye Distance:   Left Eye Distance:   Bilateral Distance:    Right Eye Near:   Left Eye Near:    Bilateral Near:     Physical Exam  Constitutional: He is oriented to person, place, and time. He appears well-developed and well-nourished. He appears ill. No distress.  HENT:  Head: Normocephalic and atraumatic.  Right Ear: Hearing, tympanic membrane, external ear and ear canal normal.  Left Ear: Hearing, tympanic membrane, external ear and ear canal normal.  Nose: Mucosal edema and rhinorrhea present. Right sinus exhibits maxillary sinus tenderness. Right sinus exhibits no frontal sinus tenderness. Left sinus exhibits maxillary sinus tenderness. Left sinus exhibits no frontal sinus tenderness.  Mouth/Throat: Uvula is midline and mucous membranes are normal. He has dentures. Posterior oropharyngeal erythema present.  Eyes: Conjunctivae and EOM are normal.  Neck: Normal range of motion. Neck supple.  Cardiovascular: Normal rate, regular rhythm and normal heart sounds.  No murmur heard. Pulmonary/Chest: Effort normal and breath sounds normal. No stridor. No respiratory distress. He has no decreased breath sounds. He has no wheezes. He has no rhonchi.    Lymphadenopathy:    He has no cervical adenopathy.  Neurological: He is alert and oriented to person, place, and time.  Skin: Skin is warm and dry. Capillary refill takes less than 2 seconds. No rash noted.  Psychiatric: He has a normal mood and affect. His behavior is normal. Judgment and thought content normal.     UC Treatments / Results  Labs (all labs ordered are listed, but only abnormal results are displayed) Labs Reviewed - No data to display  EKG  EKG Interpretation None       Radiology No results found.  Procedures Procedures (  including critical care time)  Medications Ordered in UC Medications - No data to display   Initial Impression / Assessment and Plan / UC Course  I have reviewed the triage vital signs and the nursing notes.  Pertinent labs & imaging results that were available during my care of the patient were reviewed by me and considered in my medical decision making (see chart for details).    Discussed with patient that he probably has a sinus infection. Recommend start Augmentin 875mg  twice a day as directed. May continue Coricidin as directed for congestion. May also use Flonase 1 spray each nostril twice a day as needed. Increase fluid intake to help loosen up mucus. Continue to monitor blood pressure- may be elevated today due to illness. Follow-up with his PCP in 1 week if not improving or sooner if symptoms worsen.     Final Clinical Impressions(s) / UC Diagnoses   Final diagnoses:  Acute non-recurrent maxillary sinusitis  Elevated blood pressure reading with diagnosis of hypertension    ED Discharge Orders        Ordered    amoxicillin-clavulanate (AUGMENTIN) 875-125 MG tablet  Every 12 hours     12/17/17 1027       Controlled Substance Prescriptions [place] Controlled Substance Registry consulted? Not Applicable   Katy Apo, NP 12/17/17 2316

## 2017-12-17 NOTE — Discharge Instructions (Addendum)
Recommend start Augmentin 875mg  twice a day as directed. May continue Coricidin as directed for congestion. May also use Flonase 1 spray each nostril twice a day as needed. Increase fluid intake to help loosen up mucus. Follow-up with your PCP in 1 week if not improving or sooner if symptoms worsen.

## 2017-12-17 NOTE — ED Triage Notes (Signed)
PT reports cough and congestion since last week. PT has been taking theraflu and coricidin.

## 2017-12-24 ENCOUNTER — Ambulatory Visit (HOSPITAL_COMMUNITY)
Admission: EM | Admit: 2017-12-24 | Discharge: 2017-12-24 | Disposition: A | Discharge status: Home / Self Care | Payer: Medicare Other | Attending: Emergency Medicine | Admitting: Emergency Medicine

## 2017-12-24 ENCOUNTER — Encounter (HOSPITAL_COMMUNITY): Payer: Self-pay | Admitting: Emergency Medicine

## 2017-12-24 DIAGNOSIS — J019 Acute sinusitis, unspecified: Secondary | ICD-10-CM

## 2017-12-24 MED ORDER — CETIRIZINE HCL 10 MG PO CAPS: 10.0000 mg | ORAL_CAPSULE | Freq: Every day | ORAL | 0 refills | Status: DC

## 2017-12-24 MED ORDER — AMOXICILLIN-POT CLAVULANATE 875-125 MG PO TABS: 1.0000 | ORAL_TABLET | Freq: Two times a day (BID) | ORAL | 0 refills | Status: AC

## 2017-12-24 NOTE — ED Triage Notes (Signed)
Pt here for recheck of URI sx; pt sts finished antibiotics and still having some cough

## 2017-12-24 NOTE — Discharge Instructions (Signed)
Please continue Augmentin for 3 more days.   The cough may linger for 1-2 weeks after.   You may add in a daily allergy pill to help with congestion.

## 2017-12-24 NOTE — ED Provider Notes (Addendum)
[place]    CSN: [id number] Arrival date & time: 12/24/17  1015     History   Chief Complaint Chief Complaint  Patient presents with  . URI    HPI Ryan Craig is a 72 y.o. male hx of HTN, Patient is presenting with URI symptoms- congestion, cough, denies sore throat. Patient's main complaints are  Persistent cough. Symptoms have been going on for approximately 2 weeks.  He recently finished a 7-day course of Augmentin, states that he is relatively improved, besides the persistent cough and congestion.  Is also using Coricidin, TheraFlu, Flonase.  Denies fever, nausea, vomiting, diarrhea. Denies shortness of breath and chest pain.  Overall he is relatively improved from last week, no longer having fever, nausea vomiting.   HPI  Past Medical History:  Diagnosis Date  . Anemia    no on any meds  . Arthritis   . Chronic back pain    deteriorating  . Hyperlipidemia    was on Lipitor but was taken off 3-69yrs ago by medical md bc running liver enzymes up  . Hypertension    takes Amlodipine and Quinapril daily  . Joint pain   . Joint swelling     Patient Active Problem List   Diagnosis Date Noted  . Osteoarthritis of left knee 12/22/2014  . Status post total left knee replacement 12/22/2014  . Acute medial meniscus tear of left knee 02/24/2014    Past Surgical History:  Procedure Laterality Date  . arthroscopic knee surgery Right   . COLONOSCOPY    . INGUINAL HERNIA REPAIR Bilateral    x 4  . JOINT REPLACEMENT Right 6 yrs ago   knee  . KNEE ARTHROSCOPY Left 02/24/2014   Procedure: LEFT KNEE ARTHROSCOPY WITH PARTIAL MEDIAL MENISCECTOMY;  Surgeon: Mcarthur Rossetti, MD;  Location: [facility];  Service: Orthopedics;  Laterality: Left;  . TOTAL KNEE ARTHROPLASTY Left 12/22/2014   Procedure: LEFT TOTAL KNEE ARTHROPLASTY;  Surgeon: Mcarthur Rossetti, MD;  Location: [facility];  Service: Orthopedics;  Laterality: Left;       Home Medications    Prior to  Admission medications   Medication Sig Start Date End Date Taking? Authorizing Provider  amLODipine (NORVASC) 10 MG tablet Take 10 mg by mouth daily.    [provider]  amoxicillin-clavulanate (AUGMENTIN) 875-125 MG tablet Take 1 tablet by mouth every 12 (twelve) hours for 3 days. 12/24/17 12/27/17  Cougar Imel C, PA-C  aspirin EC 81 MG tablet Take 81 mg by mouth daily.    [provider]  Cetirizine HCl 10 MG CAPS Take 1 capsule (10 mg total) by mouth daily for 10 days. 12/24/17 01/03/18  Kiree Dejarnette C, PA-C  Cholecalciferol (VITAMIN D-3 PO) Take 2 tablets by mouth daily.     [provider]  fluticasone (FLONASE) 50 MCG/ACT nasal spray Place 1 spray into both nostrils daily. 10/27/17   Zigmund Gottron, NP  Pyridoxine HCl (VITAMIN B-6 PO) Take 100 mg by mouth daily.     [provider]  quinapril (ACCUPRIL) 40 MG tablet Take 40 mg by mouth at bedtime.    [provider]    Family History History reviewed. No pertinent family history.  Social History Social History   Tobacco Use  . Smoking status: Former Research scientist (life sciences)  . Smokeless tobacco: Never Used  . Tobacco comment: quit smoking about 6-7ytrs ago  Substance Use Topics  . Alcohol use: No    Comment: drank over 40 years ago  .  Drug use: No     Allergies   Contrast media [iodinated diagnostic agents] and Latex   Review of Systems Review of Systems  Constitutional: Negative for activity change, appetite change, fatigue and fever.  HENT: Positive for congestion. Negative for ear pain, postnasal drip, rhinorrhea, sinus pressure and sore throat.   Eyes: Negative for pain and itching.  Respiratory: Positive for cough. Negative for shortness of breath.   Cardiovascular: Negative for chest pain.  Gastrointestinal: Negative for abdominal pain, diarrhea, nausea and vomiting.  Musculoskeletal: Negative for myalgias.  Skin: Negative for rash.  Neurological: Negative for dizziness,  light-headedness and headaches.     Physical Exam Triage Vital Signs ED Triage Vitals [12/24/17 1137]  Enc Vitals Group     BP (!) 161/88     Pulse Rate 79     Resp 18     Temp 98.3 F (36.8 C)     Temp Source Oral     SpO2 99 %     Weight      Height      Head Circumference      Peak Flow      Pain Score      Pain Loc      Pain Edu?      Excl. in [place]?    No data found.  Updated Vital Signs BP (!) 161/88 (BP Location: Right Arm)   Pulse 79   Temp 98.3 F (36.8 C) (Oral)   Resp 18   SpO2 99%   Visual Acuity Right Eye Distance:   Left Eye Distance:   Bilateral Distance:    Right Eye Near:   Left Eye Near:    Bilateral Near:     Physical Exam  Constitutional: He appears well-developed and well-nourished.  HENT:  Head: Normocephalic and atraumatic.  Bilateral TMs nonerythematous, nasal mucosa erythematous and swollen turbinates, posterior oropharynx mildly erythematous, no tonsillar enlargement or exudate.  Eyes: Conjunctivae are normal.  Neck: Neck supple.  Cardiovascular: Normal rate and regular rhythm.  No murmur heard. Pulmonary/Chest: Effort normal and breath sounds normal. No respiratory distress.  Breathing comfortably at rest, CTA BL, no adventitious sounds appreciated  Abdominal: Soft. There is no tenderness.  Musculoskeletal: He exhibits no edema.  Neurological: He is alert.  Skin: Skin is warm and dry.  Psychiatric: He has a normal mood and affect.  Nursing note and vitals reviewed.    UC Treatments / Results  Labs (all labs ordered are listed, but only abnormal results are displayed) Labs Reviewed - No data to display  EKG  EKG Interpretation None       Radiology No results found.  Procedures Procedures (including critical care time)  Medications Ordered in UC Medications - No data to display   Initial Impression / Assessment and Plan / UC Course  I have reviewed the triage vital signs and the nursing notes.  Pertinent  labs & imaging results that were available during my care of the patient were reviewed by me and considered in my medical decision making (see chart for details).     Patient finished 7-day course of Augmentin yesterday, will provide 3 more days to provide full 10 day course, advised that his cough may linger for 1-2 weeks after URI, as long as things are progressing and improving expect this to resolve on its own.  He should not need further antibiotics.  May add in daily allergy pill to help with congestion.  Overall he is improving, vital signs stable,  lungs clear on exam, will defer chest x-ray for now.  Discussed strict return precautions. Patient verbalized understanding and is agreeable with plan.   Final Clinical Impressions(s) / UC Diagnoses   Final diagnoses:  Acute sinusitis with symptoms > 10 days    ED Discharge Orders        Ordered    amoxicillin-clavulanate (AUGMENTIN) 875-125 MG tablet  Every 12 hours    Comments:  Patient previously on a 7 day course of this- providing 3 extra days for a 10 days course.   12/24/17 1213    Cetirizine HCl 10 MG CAPS  Daily     12/24/17 1213       Controlled Substance Prescriptions [place] Controlled Substance Registry consulted? Not Applicable   Janith Lima, PA-C 12/24/17 1218    Dontae Minerva, [place] C, [place] 12/24/17 1220

## 2017-12-31 DIAGNOSIS — Z1211 Encounter for screening for malignant neoplasm of colon: Secondary | ICD-10-CM | POA: Diagnosis not present

## 2017-12-31 DIAGNOSIS — I10 Essential (primary) hypertension: Secondary | ICD-10-CM | POA: Diagnosis not present

## 2017-12-31 DIAGNOSIS — K625 Hemorrhage of anus and rectum: Secondary | ICD-10-CM | POA: Diagnosis not present

## 2018-03-06 ENCOUNTER — Encounter (INDEPENDENT_AMBULATORY_CARE_PROVIDER_SITE_OTHER): Payer: Self-pay | Admitting: Orthopaedic Surgery

## 2018-03-06 ENCOUNTER — Ambulatory Visit (INDEPENDENT_AMBULATORY_CARE_PROVIDER_SITE_OTHER): Payer: Medicare Other | Admitting: Orthopaedic Surgery

## 2018-03-06 ENCOUNTER — Ambulatory Visit (INDEPENDENT_AMBULATORY_CARE_PROVIDER_SITE_OTHER): Payer: Medicare Other

## 2018-03-06 DIAGNOSIS — Z96653 Presence of artificial knee joint, bilateral: Secondary | ICD-10-CM

## 2018-03-06 DIAGNOSIS — M25561 Pain in right knee: Secondary | ICD-10-CM

## 2018-03-06 DIAGNOSIS — M25562 Pain in left knee: Secondary | ICD-10-CM

## 2018-03-06 MED ORDER — ACETAMINOPHEN-CODEINE #3 300-30 MG PO TABS: 1.0000 | ORAL_TABLET | Freq: Three times a day (TID) | ORAL | 0 refills | Status: DC | PRN

## 2018-03-06 NOTE — Progress Notes (Signed)
Office Visit Note   Patient: Ryan Craig           Date of Birth: May 18, 1946           MRN: [id number] Visit Date: 03/06/2018              Requested by: Glendale Chard, [address] [place], [place] [address] PCP: Glendale Chard, MD   Assessment & Plan: Visit Diagnoses:  1. Pain in both knees, unspecified chronicity   2. History of bilateral knee replacement     Plan: At this point will obtain a three-phase bone scan to rule out prosthetic loosening.  We will see him back after the study is done.  I will send in some Tylenol with codeine to take at night.  Follow-Up Instructions: Return in about 2 weeks (around 03/20/2018).   Orders:  Orders Placed This Encounter  Procedures  . XR Knee 1-2 Views Left  . XR Knee 1-2 Views Right   No orders of the defined types were placed in this encounter.     Procedures: No procedures performed   Clinical Data: No additional findings.   Subjective: Chief Complaint  Patient presents with  . Left Knee - Pain  The patient is well-known to [place].  He has a history of bilateral knee replacements done years apart.  His most recent left knee was replaced 3 years ago.  He says the knee does throb at night and gives him problems.  He has a cane.  He has no issues with his right knee.  His left knee that bothers him quite a bit.  He feels like his activities of daily living are being detrimentally affected due to knee pain.  Is mainly a throbbing pain he says at night.  HPI  Review of Systems He has had no recent illnesses.  Objective: Vital Signs: There were no vitals taken for this visit.  Physical Exam He is alert and oriented x3 and in no acute distress Ortho Exam Examination of both knees show no effusion no warmth.  He has good range of motion of both knees but the left knee is painful to him throughout the arc of motion.  Feels stable on my exam. Specialty Comments:  No specialty comments  available.  Imaging: Xr Knee 1-2 Views Left  Result Date: 03/06/2018 2 views of the left knee show total knee arthroplasty with some scar tissue in the soft tissue anterior to the knee joint  Xr Knee 1-2 Views Right  Result Date: 03/06/2018 2 views of the right knee show total knee arthroplasty with no complicating features.    PMFS History: Patient Active Problem List   Diagnosis Date Noted  . Osteoarthritis of left knee 12/22/2014  . Status post total left knee replacement 12/22/2014  . Acute medial meniscus tear of left knee 02/24/2014   Past Medical History:  Diagnosis Date  . Anemia    no on any meds  . Arthritis   . Chronic back pain    deteriorating  . Hyperlipidemia    was on Lipitor but was taken off 3-54yrs ago by medical md bc running liver enzymes up  . Hypertension    takes Amlodipine and Quinapril daily  . Joint pain   . Joint swelling     History reviewed. No pertinent family history.  Past Surgical History:  Procedure Laterality Date  . arthroscopic knee surgery Right   . COLONOSCOPY    . INGUINAL HERNIA REPAIR Bilateral  x 4  . JOINT REPLACEMENT Right 6 yrs ago   knee  . KNEE ARTHROSCOPY Left 02/24/2014   Procedure: LEFT KNEE ARTHROSCOPY WITH PARTIAL MEDIAL MENISCECTOMY;  Surgeon: Mcarthur Rossetti, MD;  Location: [facility];  Service: Orthopedics;  Laterality: Left;  . TOTAL KNEE ARTHROPLASTY Left 12/22/2014   Procedure: LEFT TOTAL KNEE ARTHROPLASTY;  Surgeon: Mcarthur Rossetti, MD;  Location: [facility];  Service: Orthopedics;  Laterality: Left;   Social History   Occupational History  . Not on file  Tobacco Use  . Smoking status: Former Research scientist (life sciences)  . Smokeless tobacco: Never Used  . Tobacco comment: quit smoking about 6-7ytrs ago  Substance and Sexual Activity  . Alcohol use: No    Comment: drank over 40 years ago  . Drug use: No  . Sexual activity: Yes

## 2018-03-07 ENCOUNTER — Other Ambulatory Visit (INDEPENDENT_AMBULATORY_CARE_PROVIDER_SITE_OTHER): Payer: Self-pay

## 2018-03-07 DIAGNOSIS — G8929 Other chronic pain: Secondary | ICD-10-CM

## 2018-03-07 DIAGNOSIS — M25562 Pain in left knee: Principal | ICD-10-CM

## 2018-03-11 ENCOUNTER — Telehealth (INDEPENDENT_AMBULATORY_CARE_PROVIDER_SITE_OTHER): Payer: Self-pay | Admitting: *Deleted

## 2018-03-11 NOTE — Telephone Encounter (Signed)
Pt is scheduled at [facility] for 3 phase bone scan on Tues May 28 at 11am for the injection and then back again at 2:00pm for the scan. I called pt and left vm to return my call for appt information.

## 2018-03-12 NOTE — Telephone Encounter (Signed)
Pt aware of appt.

## 2018-03-19 ENCOUNTER — Encounter (HOSPITAL_COMMUNITY)
Admission: RE | Admit: 2018-03-19 | Discharge: 2018-03-19 | Disposition: A | Discharge status: Home / Self Care | Payer: Medicare Other | Source: Ambulatory Visit | Attending: Orthopaedic Surgery | Admitting: Orthopaedic Surgery

## 2018-03-19 DIAGNOSIS — M25562 Pain in left knee: Secondary | ICD-10-CM | POA: Insufficient documentation

## 2018-03-19 DIAGNOSIS — R948 Abnormal results of function studies of other organs and systems: Secondary | ICD-10-CM | POA: Diagnosis not present

## 2018-03-19 DIAGNOSIS — G8929 Other chronic pain: Secondary | ICD-10-CM | POA: Diagnosis not present

## 2018-03-19 IMAGING — NM NM BONE 3 PHASE
8 series · 18 of 18 positions shown · non-contrast
Comparison: Bone scan [DATE].  Radiographs [DATE].

CLINICAL DATA: Pain and swelling in the left knee for 1.5 weeks.
History of bilateral total knee arthroplasty.

EXAM:
NUCLEAR MEDICINE 3-PHASE BONE SCAN
TECHNIQUE: Radionuclide angiographic images, immediate static blood pool
images, and 3-hour delayed static images were obtained of the knees
after intravenous injection of radiopharmaceutical.
RADIOPHARMACEUTICALS:  21.0 mCi [TW] MDP IV

[Series 1: flow · 2.07mm/px · 6 of 48 frames shown (1 of 2)]
[frame 5/48]
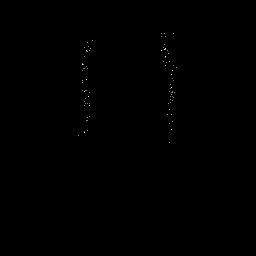
[frame 13/48  full-range]
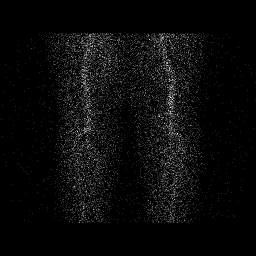
[frame 21/48  full-range]
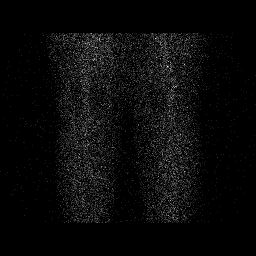
[frame 29/48  full-range]
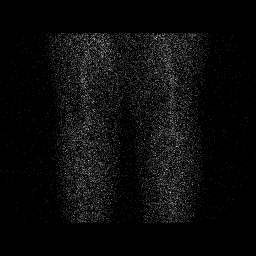
[frame 37/48  full-range]
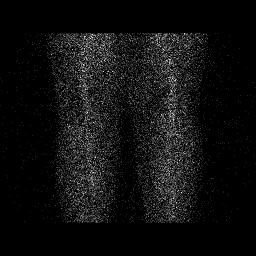
[frame 45/48  full-range]
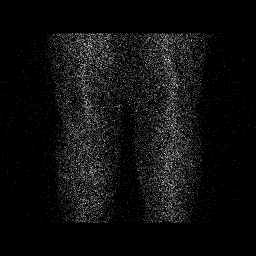

[Series 1: flow · 2.07mm/px · 6 of 48 frames shown (2 of 2)]
[frame 5/48]
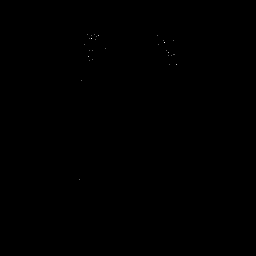
[frame 13/48  full-range]
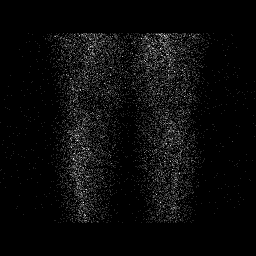
[frame 21/48  full-range]
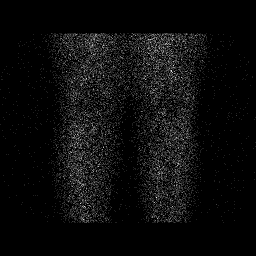
[frame 29/48  full-range]
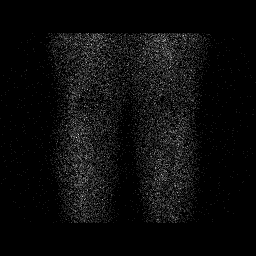
[frame 37/48  full-range]
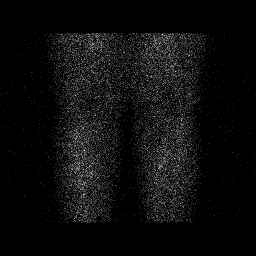
[frame 45/48  full-range]
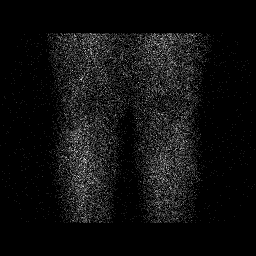

[Series 2: blood pool · 2.07mm/px · 1 of 1 slices shown (1 of 4)]
[im 1/1  full-range]
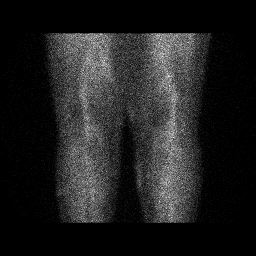

[Series 2: blood pool · 2.07mm/px · 1 of 1 slices shown (2 of 4)]
[im 1/1  full-range]
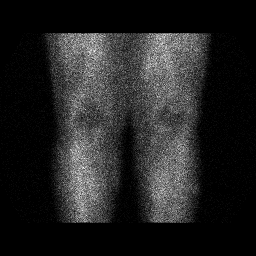

[Series 3: lat bp · 2.07mm/px · 1 of 1 slices shown (1 of 2)]
[im 1/1  full-range]
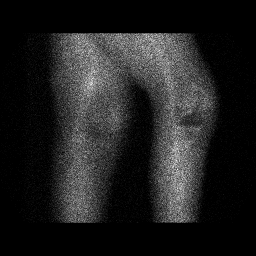

[Series 3: lat bp · 2.07mm/px · 1 of 1 slices shown (2 of 2)]
[im 1/1  full-range]
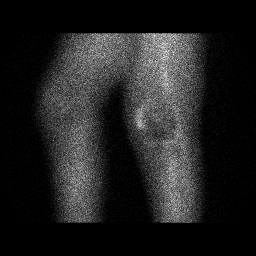

[Series 4: blood pool · 2.07mm/px · 1 of 1 slices shown (3 of 4)]
[im 1/1  full-range]
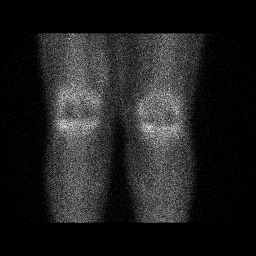

[Series 5: blood pool · 2.07mm/px · 1 of 1 slices shown (4 of 4)]
[im 1/1]
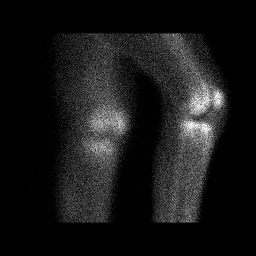

[18 of 18 positions shown; findings below may reference images not displayed]

FINDINGS: Vascular phase: Normal symmetric perfusion to both knees.

Blood pool phase: The blood pool activity appears normal on the
anterior and posterior images. There is mildly increased activity
associated with the left patella on the lateral view.

Delayed phase: Mild asymmetric delayed phase activity associated
with the left patella. The femoral and tibial activity is within
normal limits status post bilateral total knee arthroplasty.
IMPRESSION: 1. Mild asymmetric blood pool and delayed phase activity associated
with the left patella, likely reactive or stress related.
2. No evidence of prosthetic loosening or infection.

## 2018-03-19 MED ORDER — TECHNETIUM TC 99M MEDRONATE IV KIT
20.0000 | PACK | Freq: Once | INTRAVENOUS | Status: AC | PRN
  Administered 2018-03-19: 20 via INTRAVENOUS

## 2018-03-20 ENCOUNTER — Encounter (INDEPENDENT_AMBULATORY_CARE_PROVIDER_SITE_OTHER): Payer: Self-pay | Admitting: Orthopaedic Surgery

## 2018-03-20 ENCOUNTER — Ambulatory Visit (INDEPENDENT_AMBULATORY_CARE_PROVIDER_SITE_OTHER): Payer: Medicare Other | Admitting: Orthopaedic Surgery

## 2018-03-20 DIAGNOSIS — M25562 Pain in left knee: Secondary | ICD-10-CM | POA: Diagnosis not present

## 2018-03-20 MED ORDER — ACETAMINOPHEN-CODEINE #3 300-30 MG PO TABS: 1.0000 | ORAL_TABLET | Freq: Three times a day (TID) | ORAL | 0 refills | Status: DC | PRN

## 2018-03-20 NOTE — Progress Notes (Signed)
Office Visit Note   Patient: Ryan Craig           Date of Birth: [birth year]           MRN: [id number] Visit Date: 03/20/2018              Requested by: Glendale Chard, [address] [place], [place] [address] PCP: Glendale Chard, MD   Assessment & Plan: Visit Diagnoses:  1. Acute pain of left knee     Plan: Send him to physical therapy for strengthening, home exercise program modalities to the left knee.  He will follow-up with [place] 6 weeks check his progress lack of.  Questions were encouraged and answered by Dr. Ninfa Linden and myself today.  Follow-Up Instructions: Return in about 6 weeks (around 05/01/2018).   Orders:  No orders of the defined types were placed in this encounter.  Meds ordered this encounter  Medications  . acetaminophen-codeine (TYLENOL #3) 300-30 MG tablet    Sig: Take 1-2 tablets by mouth every 8 (eight) hours as needed for moderate pain.    Dispense:  40 tablet    Refill:  0      Procedures: No procedures performed   Clinical Data: No additional findings.   Subjective: Chief Complaint  Patient presents with  . Left Knee - Follow-up    HPI Ryan Craig returns today follow-up of his left knee status post bone scan.  Continues to have pain mainly medial aspect of the knee.  He does report that he did have a fall onto the knee sometime ago.  States the Tylenol No. 3 did help with his pain.  Is having no mechanical symptoms of the knee. Bone scan results reviewed with patient and show no signs of loosening of the knee replacement components.   .  Review of Systems  See HPI otherwise negative   Objective: Vital Signs: There were no vitals taken for this visit.  Physical Exam  Constitutional: He is oriented to person, place, and time. He appears well-developed and well-nourished. No distress.  Pulmonary/Chest: Effort normal.  Neurological: He is alert and oriented to person, place, and time.  Skin: He is not diaphoretic.     Ortho Exam Bilateral knees no effusion abnormal warmth erythema.  Good range of motion of both knees.  Tenderness over the left medial collateral ligament region.  No instability valgus varus stressing of either knee however valgus stressing of the left knee causes pain medial collateral ligament region.  He has slight tenderness over the peripatellar region of the left knee only. Specialty Comments:  No specialty comments available.  Imaging: Nm Bone Scan 3 Phase  Result Date: 03/19/2018 CLINICAL DATA:  Pain and swelling in the left knee for 1.5 weeks. History of bilateral total knee arthroplasty. EXAM: NUCLEAR MEDICINE 3-PHASE BONE SCAN TECHNIQUE: Radionuclide angiographic images, immediate static blood pool images, and 3-hour delayed static images were obtained of the knees after intravenous injection of radiopharmaceutical. RADIOPHARMACEUTICALS:  21.0 mCi Tc-22m MDP IV COMPARISON:  Bone scan 04/04/2012.  Radiographs 03/06/2018. FINDINGS: Vascular phase: Normal symmetric perfusion to both knees. Blood pool phase: The blood pool activity appears normal on the anterior and posterior images. There is mildly increased activity associated with the left patella on the lateral view. Delayed phase: Mild asymmetric delayed phase activity associated with the left patella. The femoral and tibial activity is within normal limits status post bilateral total knee arthroplasty. IMPRESSION: 1. Mild asymmetric blood pool and delayed phase activity associated  with the left patella, likely reactive or stress related. 2. No evidence of prosthetic loosening or infection. Electronically Signed   By: Richardean Sale M.D.   On: 03/19/2018 15:17     PMFS History: Patient Active Problem List   Diagnosis Date Noted  . Osteoarthritis of left knee 12/22/2014  . Status post total left knee replacement 12/22/2014  . Acute medial meniscus tear of left knee 02/24/2014   Past Medical History:  Diagnosis Date  . Anemia     no on any meds  . Arthritis   . Chronic back pain    deteriorating  . Hyperlipidemia    was on Lipitor but was taken off 3-13yrs ago by medical md bc running liver enzymes up  . Hypertension    takes Amlodipine and Quinapril daily  . Joint pain   . Joint swelling     No family history on file.  Past Surgical History:  Procedure Laterality Date  . arthroscopic knee surgery Right   . COLONOSCOPY    . INGUINAL HERNIA REPAIR Bilateral    x 4  . JOINT REPLACEMENT Right 6 yrs ago   knee  . KNEE ARTHROSCOPY Left 02/24/2014   Procedure: LEFT KNEE ARTHROSCOPY WITH PARTIAL MEDIAL MENISCECTOMY;  Surgeon: Mcarthur Rossetti, MD;  Location: [facility];  Service: Orthopedics;  Laterality: Left;  . TOTAL KNEE ARTHROPLASTY Left 12/22/2014   Procedure: LEFT TOTAL KNEE ARTHROPLASTY;  Surgeon: Mcarthur Rossetti, MD;  Location: [facility];  Service: Orthopedics;  Laterality: Left;   Social History   Occupational History  . Not on file  Tobacco Use  . Smoking status: Former Research scientist (life sciences)  . Smokeless tobacco: Never Used  . Tobacco comment: quit smoking about 6-7ytrs ago  Substance and Sexual Activity  . Alcohol use: No    Comment: drank over 40 years ago  . Drug use: No  . Sexual activity: Yes

## 2018-03-22 ENCOUNTER — Other Ambulatory Visit (INDEPENDENT_AMBULATORY_CARE_PROVIDER_SITE_OTHER): Payer: Self-pay

## 2018-03-22 DIAGNOSIS — M25562 Pain in left knee: Principal | ICD-10-CM

## 2018-03-22 DIAGNOSIS — G8929 Other chronic pain: Secondary | ICD-10-CM

## 2018-04-08 DIAGNOSIS — M25562 Pain in left knee: Secondary | ICD-10-CM | POA: Diagnosis not present

## 2018-04-08 DIAGNOSIS — R351 Nocturia: Secondary | ICD-10-CM | POA: Diagnosis not present

## 2018-04-08 DIAGNOSIS — I10 Essential (primary) hypertension: Secondary | ICD-10-CM | POA: Diagnosis not present

## 2018-04-08 DIAGNOSIS — R7303 Prediabetes: Secondary | ICD-10-CM | POA: Diagnosis not present

## 2018-04-09 ENCOUNTER — Other Ambulatory Visit (INDEPENDENT_AMBULATORY_CARE_PROVIDER_SITE_OTHER): Payer: Self-pay | Admitting: Orthopaedic Surgery

## 2018-04-09 ENCOUNTER — Encounter: Payer: Self-pay | Admitting: Physical Therapy

## 2018-04-09 ENCOUNTER — Ambulatory Visit: Payer: Medicare Other | Attending: Orthopaedic Surgery | Admitting: Physical Therapy

## 2018-04-09 DIAGNOSIS — M25562 Pain in left knee: Secondary | ICD-10-CM | POA: Diagnosis not present

## 2018-04-09 DIAGNOSIS — M6281 Muscle weakness (generalized): Secondary | ICD-10-CM | POA: Diagnosis not present

## 2018-04-09 NOTE — Telephone Encounter (Signed)
Please advise 

## 2018-04-09 NOTE — Therapy (Signed)
[place], [place], [address] Phone: [phone]   Fax:  [phone]  Physical Therapy Evaluation  Patient Details  Name: Ryan Craig MRN: [id number] Date of Birth: 25-Sep-1946 Referring Provider: Dr Jean Rosenthal    Encounter Date: 04/09/2018  PT End of Session - 04/09/18 1421    Visit Number  1    Number of Visits  1    Date for PT Re-Evaluation  04/10/18    Authorization Type  Medicare    PT Start Time  1517    PT Stop Time  1048    PT Time Calculation (min)  33 min    Activity Tolerance  Patient tolerated treatment well    Behavior During Therapy  [facility] for tasks assessed/performed       Past Medical History:  Diagnosis Date  . Anemia    no on any meds  . Arthritis   . Chronic back pain    deteriorating  . Hyperlipidemia    was on Lipitor but was taken off 3-86yrs ago by medical md bc running liver enzymes up  . Hypertension    takes Amlodipine and Quinapril daily  . Joint pain   . Joint swelling     Past Surgical History:  Procedure Laterality Date  . arthroscopic knee surgery Right   . COLONOSCOPY    . INGUINAL HERNIA REPAIR Bilateral    x 4  . JOINT REPLACEMENT Right 6 yrs ago   knee  . KNEE ARTHROSCOPY Left 02/24/2014   Procedure: LEFT KNEE ARTHROSCOPY WITH PARTIAL MEDIAL MENISCECTOMY;  Surgeon: Mcarthur Rossetti, MD;  Location: [facility];  Service: Orthopedics;  Laterality: Left;  . TOTAL KNEE ARTHROPLASTY Left 12/22/2014   Procedure: LEFT TOTAL KNEE ARTHROPLASTY;  Surgeon: Mcarthur Rossetti, MD;  Location: [facility];  Service: Orthopedics;  Laterality: Left;    There were no vitals filed for this visit.   Subjective Assessment - 04/09/18 1020    Subjective  Patient had a fall about a month ago. He hit his left knee and had a bruise. His pain has improved significantly. His pain was up the medial portion of the knee.He reports his knee pain has resolved.    Pertinent  History  bilateral knee replacement     Limitations  Walking;Standing    How long can you walk comfortably?  Every once and a while the right knee will give out.     Diagnostic tests  Bone Scan: left knee: Stress reaction of the left knee     Patient Stated Goals  to have no pain his knee.     Currently in Pain?  No/denies No more then normal pain     Pain Score  0-No pain    Pain Location  Knee    Pain Orientation  Left    Pain Descriptors / Indicators  Aching    Pain Type  Chronic pain    Pain Onset  More than a month ago    Pain Frequency  Constant    Aggravating Factors   standing for long periods of time     Pain Relieving Factors  rest     Effect of Pain on Daily Activities  difficulty perfroming ADL's     Multiple Pain Sites  No         [facility] PT Assessment - 04/09/18 0001      Assessment   Medical Diagnosis  Left Knee Pain     Referring Provider  Dr Jean Rosenthal     Onset Date/Surgical Date  -- 1 month prior     Hand Dominance  Right    Next MD Visit  6 weeks     Prior Therapy  For bilateral knee replacements       Precautions   Precautions  None      Restrictions   Weight Bearing Restrictions  No      Balance Screen   Has the patient fallen in the past 6 months  Yes    How many times?  1 slipped on grass while mowing a hill     Has the patient had a decrease in activity level because of a fear of falling?   No    Is the patient reluctant to leave their home because of a fear of falling?   No      Home Environment   Additional Comments  1 step into the house       Prior Function   Level of Independence  Independent    Vocation  Retired    Biomedical scientist  Still walking       Cognition   Overall Cognitive Status  Within Functional Limits for tasks assessed    Attention  Focused    Focused Attention  Appears intact    Memory  Appears intact    Awareness  Appears intact    Problem Solving  Appears intact      Sensation   Light Touch   Appears Intact      Coordination   Gross Motor Movements are Fluid and Coordinated  Yes    Fine Motor Movements are Fluid and Coordinated  Yes      ROM / Strength   AROM / PROM / Strength  AROM;PROM;Strength      AROM   AROM Assessment Site  Knee    Right/Left Knee  Right;Left    Right Knee Extension  0    Right Knee Flexion  116    Left Knee Extension  0    Left Knee Flexion  105      PROM   PROM Assessment Site  Hip;Knee    Right/Left Knee  Right;Left    Right Knee Extension  0    Right Knee Flexion  116    Left Knee Extension  0    Left Knee Flexion  110      Strength   Strength Assessment Site  Knee;Hip    Right/Left Hip  Right;Left    Right Hip Flexion  5/5    Right Hip ABduction  5/5    Right Hip ADduction  5/5    Left Hip Flexion  4/5    Right/Left Knee  Right;Left    Right Knee Flexion  5/5    Right Knee Extension  5/5    Left Knee Flexion  5/5    Left Knee Extension  4+/5                Objective measurements completed on examination: See above findings.              PT Education - 04/09/18 1421    Education Details  Reviewed HEP; symptom mangement; hip flexor strengthening     Person(s) Educated  Patient    Methods  Explanation;Tactile cues;Demonstration;Verbal cues;Handout    Comprehension  Verbalized understanding;Returned demonstration;Verbal cues required;Tactile cues required;Need further instruction       PT Short Term Goals - 04/09/18 1555  PT SHORT TERM GOAL #1   Title  Patient will be independent with HEP     Time  1    Period  Days    Status  Achieved    Target Date  04/09/18                Plan - 04/09/18 1423    Clinical Impression Statement  Patient is a 73 year old male with left kneepain. At this point he reports his pain has resolved. He feels minor weakness compared to the right knee. He has decreased hip flexion strength and knee extension strength compared to the right. His left knee passive  flexion is limited but comprable to the right. He feels comfortable with exercising at home. He would like an HEP then to discharge. He was indepndent with his HEP after treatment.     History and Personal Factors relevant to plan of care:  bilateral knee replacements     Clinical Presentation  Stable    Clinical Decision Making  Low    Rehab Potential  Good    PT Frequency  One time visit    PT Treatment/Interventions  ADLs/Self Care Home Management;Therapeutic exercise;Patient/family education    PT Next Visit Plan  1x visit     PT Home Exercise Plan  See patient instructions     Consulted and Agree with Plan of Care  Patient       Patient will benefit from skilled therapeutic intervention in order to improve the following deficits and impairments:  Abnormal gait, Pain, Decreased activity tolerance  Visit Diagnosis: Acute pain of left knee  Muscle weakness (generalized)     Problem List Patient Active Problem List   Diagnosis Date Noted  . Osteoarthritis of left knee 12/22/2014  . Status post total left knee replacement 12/22/2014  . Acute medial meniscus tear of left knee 02/24/2014    Carney Living PT DPT  04/09/2018, 8:41 PM  [facility] [address] [place], [place], [address] Phone: [phone]   Fax:  [phone]  Name: Ryan Craig MRN: [id number] Date of Birth: Sep 27, 1946

## 2018-04-09 NOTE — Therapy (Deleted)
[place], [place], [address] Phone: [phone]   Fax:  [phone]  Physical Therapy Treatment  Patient Details  Name: Ryan Craig MRN: [id number] Date of Birth: 05-27-1946 Referring Provider: Dr Jean Rosenthal    Encounter Date: 04/09/2018  PT End of Session - 04/09/18 1421    Visit Number  1    Number of Visits  1    Date for PT Re-Evaluation  04/10/18    Authorization Type  Medicare    PT Start Time  5027    PT Stop Time  1048    PT Time Calculation (min)  33 min    Activity Tolerance  Patient tolerated treatment well    Behavior During Therapy  [facility] for tasks assessed/performed       Past Medical History:  Diagnosis Date  . Anemia    no on any meds  . Arthritis   . Chronic back pain    deteriorating  . Hyperlipidemia    was on Lipitor but was taken off 3-32yrs ago by medical md bc running liver enzymes up  . Hypertension    takes Amlodipine and Quinapril daily  . Joint pain   . Joint swelling     Past Surgical History:  Procedure Laterality Date  . arthroscopic knee surgery Right   . COLONOSCOPY    . INGUINAL HERNIA REPAIR Bilateral    x 4  . JOINT REPLACEMENT Right 6 yrs ago   knee  . KNEE ARTHROSCOPY Left 02/24/2014   Procedure: LEFT KNEE ARTHROSCOPY WITH PARTIAL MEDIAL MENISCECTOMY;  Surgeon: Mcarthur Rossetti, MD;  Location: [facility];  Service: Orthopedics;  Laterality: Left;  . TOTAL KNEE ARTHROPLASTY Left 12/22/2014   Procedure: LEFT TOTAL KNEE ARTHROPLASTY;  Surgeon: Mcarthur Rossetti, MD;  Location: [facility];  Service: Orthopedics;  Laterality: Left;    There were no vitals filed for this visit.  Subjective Assessment - 04/09/18 1020    Subjective  Patient had a fall about a month ago. He hit his left knee and had a bruise. His pain has improved significantly. His pain was up the medial portion of the knee.     Pertinent History  bilateral knee replacement     Limitations  Walking;Standing    How long can you walk comfortably?  Every once and a while the right knee will give out.     Diagnostic tests  Bone Scan: left knee: Stress reaction of the left knee     Patient Stated Goals  to have no pain his knee.     Currently in Pain?  No/denies No more then normal pain     Pain Score  0-No pain    Pain Location  Knee    Pain Orientation  Left    Pain Descriptors / Indicators  Aching    Pain Type  Chronic pain    Pain Onset  More than a month ago    Pain Frequency  Constant    Aggravating Factors   standing for long periods of time     Pain Relieving Factors  rest     Effect of Pain on Daily Activities  difficulty perfroming ADL's     Multiple Pain Sites  No         [facility] PT Assessment - 04/09/18 0001      Assessment   Medical Diagnosis  Left Knee Pain     Referring Provider  Dr Jean Rosenthal  Onset Date/Surgical Date  -- 1 month prior     Hand Dominance  Right    Next MD Visit  6 weeks     Prior Therapy  For bilateral knee replacements       Precautions   Precautions  None      Restrictions   Weight Bearing Restrictions  No      Balance Screen   Has the patient fallen in the past 6 months  Yes    How many times?  1 slipped on grass while mowing a hill     Has the patient had a decrease in activity level because of a fear of falling?   No    Is the patient reluctant to leave their home because of a fear of falling?   No      Home Environment   Additional Comments  1 step into the house       Prior Function   Level of Independence  Independent    Vocation  Retired    Biomedical scientist  Still walking       Cognition   Overall Cognitive Status  Within Functional Limits for tasks assessed    Attention  Focused    Focused Attention  Appears intact    Memory  Appears intact    Awareness  Appears intact    Problem Solving  Appears intact      Sensation   Light Touch  Appears Intact      Coordination   Gross  Motor Movements are Fluid and Coordinated  Yes    Fine Motor Movements are Fluid and Coordinated  Yes      ROM / Strength   AROM / PROM / Strength  AROM;PROM;Strength      AROM   AROM Assessment Site  Knee    Right/Left Knee  Right;Left    Right Knee Extension  0    Right Knee Flexion  116    Left Knee Extension  0    Left Knee Flexion  105      PROM   PROM Assessment Site  Hip;Knee    Right/Left Knee  Right;Left    Right Knee Extension  0    Right Knee Flexion  116    Left Knee Extension  0    Left Knee Flexion  110      Strength   Strength Assessment Site  Knee;Hip    Right/Left Hip  Right;Left    Right Hip Flexion  5/5    Right Hip ABduction  5/5    Right Hip ADduction  5/5    Left Hip Flexion  4/5    Right/Left Knee  Right;Left    Right Knee Flexion  5/5    Right Knee Extension  5/5    Left Knee Flexion  5/5    Left Knee Extension  4+/5                           PT Education - 04/09/18 1421    Education Details  Reviewed HEP; symptom mangement; hip flexor strengthening     Person(s) Educated  Patient    Methods  Explanation;Tactile cues;Demonstration;Verbal cues;Handout    Comprehension  Verbalized understanding;Returned demonstration;Verbal cues required;Tactile cues required;Need further instruction       PT Short Term Goals - 04/09/18 1555      PT SHORT TERM GOAL #1   Title  Patient will be independent with  HEP     Time  1    Period  Days    Status  Achieved    Target Date  04/09/18               Plan - 04/09/18 1423    Clinical Impression Statement  Patient is a 72 year old male with left kneepain. At this point he reports his pain has resolved. He feels minor weakness compared to the right knee. He has decreased hip flexion strength and knee extension strength compared to the right. His left knee passive flexion is limited but comprable to the right. He feels comfortable with exercising at home. He would like an HEP then to  discharge. He was indepndent with his HEP after treatment.     History and Personal Factors relevant to plan of care:  bilateral knee replacements     Clinical Presentation  Stable    Clinical Decision Making  Low    Rehab Potential  Good    PT Frequency  One time visit    PT Treatment/Interventions  ADLs/Self Care Home Management;Therapeutic exercise;Patient/family education    PT Next Visit Plan  1x visit     PT Home Exercise Plan  See patient instructions     Consulted and Agree with Plan of Care  Patient       Patient will benefit from skilled therapeutic intervention in order to improve the following deficits and impairments:  Abnormal gait, Pain, Decreased activity tolerance  Visit Diagnosis: Acute pain of left knee  Muscle weakness (generalized)     Problem List Patient Active Problem List   Diagnosis Date Noted  . Osteoarthritis of left knee 12/22/2014  . Status post total left knee replacement 12/22/2014  . Acute medial meniscus tear of left knee 02/24/2014    Carney Living PT DPT  04/09/2018, 8:41 PM  [facility] [address] [place], [place], [address] Phone: [phone]   Fax:  [phone]  Name: Ryan Craig MRN: [id number] Date of Birth: August 02, 1946

## 2018-04-29 ENCOUNTER — Ambulatory Visit (INDEPENDENT_AMBULATORY_CARE_PROVIDER_SITE_OTHER): Payer: Medicare Other | Admitting: Orthopaedic Surgery

## 2018-08-27 DIAGNOSIS — K635 Polyp of colon: Secondary | ICD-10-CM | POA: Diagnosis not present

## 2018-08-27 DIAGNOSIS — K5730 Diverticulosis of large intestine without perforation or abscess without bleeding: Secondary | ICD-10-CM | POA: Diagnosis not present

## 2018-08-27 DIAGNOSIS — Z1211 Encounter for screening for malignant neoplasm of colon: Secondary | ICD-10-CM | POA: Diagnosis not present

## 2018-08-27 DIAGNOSIS — D123 Benign neoplasm of transverse colon: Secondary | ICD-10-CM | POA: Diagnosis not present

## 2018-08-27 LAB — HM COLONOSCOPY

## 2018-08-30 ENCOUNTER — Encounter: Payer: Self-pay | Admitting: Nurse Practitioner

## 2018-09-09 ENCOUNTER — Telehealth: Payer: Self-pay

## 2018-09-09 NOTE — Telephone Encounter (Signed)
Patient called asking if we are doing flu shots or should he go to the pharmacy.  Returned pt call and advised him that we are doing flu if he would like to come in and get one I could schedule him for a nurse visit patient stated he is going out of town next week for thanksgiving and would like to wait until he comes back. Advised him to give [place] a call to make sure we still have some available. YRL,RMA

## 2018-09-24 ENCOUNTER — Other Ambulatory Visit: Payer: Self-pay | Admitting: Internal Medicine

## 2018-09-27 ENCOUNTER — Ambulatory Visit (INDEPENDENT_AMBULATORY_CARE_PROVIDER_SITE_OTHER): Payer: Medicare Other

## 2018-09-27 DIAGNOSIS — Z23 Encounter for immunization: Secondary | ICD-10-CM | POA: Diagnosis not present

## 2018-09-27 NOTE — Progress Notes (Signed)
The patient was given a high dose flu vaccination.

## 2018-10-09 ENCOUNTER — Ambulatory Visit: Payer: Medicare Other

## 2018-10-09 ENCOUNTER — Ambulatory Visit: Payer: Medicare Other | Admitting: Nurse Practitioner

## 2018-10-11 ENCOUNTER — Other Ambulatory Visit: Payer: Self-pay | Admitting: Nurse Practitioner

## 2018-10-11 ENCOUNTER — Ambulatory Visit: Payer: Medicare Other | Admitting: Nurse Practitioner

## 2018-10-11 MED ORDER — AMLODIPINE BESYLATE 10 MG PO TABS: 10.0000 mg | ORAL_TABLET | Freq: Every day | ORAL | 0 refills | Status: DC

## 2018-10-15 ENCOUNTER — Encounter: Payer: Self-pay | Admitting: Nurse Practitioner

## 2018-10-22 ENCOUNTER — Encounter: Payer: Self-pay | Admitting: Internal Medicine

## 2018-10-22 ENCOUNTER — Ambulatory Visit (INDEPENDENT_AMBULATORY_CARE_PROVIDER_SITE_OTHER): Payer: Medicare Other | Admitting: Internal Medicine

## 2018-10-22 VITALS — BP 136/76 | HR 71 | Temp 97.8°F | Ht 66.0 in | Wt 221.6 lb

## 2018-10-22 DIAGNOSIS — I10 Essential (primary) hypertension: Secondary | ICD-10-CM | POA: Diagnosis not present

## 2018-10-22 DIAGNOSIS — R9431 Abnormal electrocardiogram [ECG] [EKG]: Secondary | ICD-10-CM | POA: Diagnosis not present

## 2018-10-22 DIAGNOSIS — Z0000 Encounter for general adult medical examination without abnormal findings: Secondary | ICD-10-CM | POA: Diagnosis not present

## 2018-10-22 DIAGNOSIS — I4510 Unspecified right bundle-branch block: Secondary | ICD-10-CM | POA: Diagnosis not present

## 2018-10-22 DIAGNOSIS — Z125 Encounter for screening for malignant neoplasm of prostate: Secondary | ICD-10-CM

## 2018-10-22 LAB — POCT URINALYSIS DIPSTICK
Bilirubin, UA: NEGATIVE
Blood, UA: NEGATIVE
Glucose, UA: NEGATIVE
Ketones, UA: NEGATIVE
Leukocytes, UA: NEGATIVE
Nitrite, UA: NEGATIVE
Protein, UA: NEGATIVE
Spec Grav, UA: 1.025 (ref 1.010–1.025)
Urobilinogen, UA: 0.2 E.U./dL
pH, UA: 5.5 (ref 5.0–8.0)

## 2018-10-22 LAB — POCT UA - MICROALBUMIN
Albumin/Creatinine Ratio, Urine, POC: 30
Creatinine, POC: 300 mg/dL
Microalbumin Ur, POC: 10 mg/L

## 2018-10-22 NOTE — Progress Notes (Signed)
Subjective:   Ryan Craig is a 72 y.o. male who presents for Medicare Annual/Subsequent preventive examination.      Objective:    Vitals: BP 136/76 (BP Location: Left Arm, Patient Position: Sitting, Cuff Size: Large)   Pulse 71   Temp 97.8 F (36.6 C) (Oral)   Ht 5\' 6"  (1.676 m)   Wt 221 lb 9.6 oz (100.5 kg)   SpO2 97%   BMI 35.77 kg/m   Body mass index is 35.77 kg/m.  Advanced Directives 01/18/2015 12/23/2014 12/11/2014 02/24/2014  Does Patient Have a Medical Advance Directive? Yes Yes Yes Patient has advance directive, copy not in chart  Type of Advance Directive [place];Living will Living will Living will [place];Living will  Does patient want to make changes to medical advance directive? - No - Patient declined - -  Copy of [place] in Chart? - No - copy requested No - copy requested -    Tobacco Social History   Tobacco Use  Smoking Status Former Smoker  Smokeless Tobacco Former Systems developer  Tobacco Comment   quit smoking about 6-7ytrs ago     Counseling given: Not Answered Comment: quit smoking about 6-7ytrs ago  Past Medical History:  Diagnosis Date  . Anemia    no on any meds  . Arthritis   . Chronic back pain    deteriorating  . Hyperlipidemia    was on Lipitor but was taken off 3-54yrs ago by medical md bc running liver enzymes up  . Hypertension    takes Amlodipine and Quinapril daily  . Joint pain   . Joint swelling    Past Surgical History:  Procedure Laterality Date  . arthroscopic knee surgery Right   . COLONOSCOPY    . INGUINAL HERNIA REPAIR Bilateral    x 4  . JOINT REPLACEMENT Right 6 yrs ago   knee  . KNEE ARTHROSCOPY Left 02/24/2014   Procedure: LEFT KNEE ARTHROSCOPY WITH PARTIAL MEDIAL MENISCECTOMY;  Surgeon: Mcarthur Rossetti, MD;  Location: [place];  Service: Orthopedics;  Laterality: Left;  . TOTAL KNEE ARTHROPLASTY Left 12/22/2014   Procedure: LEFT TOTAL KNEE ARTHROPLASTY;   Surgeon: Mcarthur Rossetti, MD;  Location: [place];  Service: Orthopedics;  Laterality: Left;   Family History  Problem Relation Age of Onset  . Heart attack Mother   . Cancer Maternal Aunt   . Cancer Paternal Aunt   . Cancer Cousin    Social History   Socioeconomic History  . Marital status: Married    Spouse name: Not on file  . Number of children: Not on file  . Years of education: Not on file  . Highest education level: Not on file  Occupational History  . Not on file  Social Needs  . Financial resource strain: Not on file  . Food insecurity:    Worry: Not on file    Inability: Not on file  . Transportation needs:    Medical: Not on file    Non-medical: Not on file  Tobacco Use  . Smoking status: Former Research scientist (life sciences)  . Smokeless tobacco: Former Systems developer  . Tobacco comment: quit smoking about 6-7ytrs ago  Substance and Sexual Activity  . Alcohol use: No    Comment: drank over 40 years ago  . Drug use: No  . Sexual activity: Yes  Lifestyle  . Physical activity:    Days per week: Not on file    Minutes per session: Not on  file  . Stress: Not on file  Relationships  . Social connections:    Talks on phone: Not on file    Gets together: Not on file    Attends religious service: Not on file    Active member of club or organization: Not on file    Attends meetings of clubs or organizations: Not on file    Relationship status: Not on file  Other Topics Concern  . Not on file  Social History Narrative  . Not on file    Outpatient Encounter Medications as of 10/22/2018  Medication Sig  . acetaminophen-codeine (TYLENOL #3) 300-30 MG tablet TAKE 1 OR 2 TABLETS BY MOUTH EVERY 8 HOURS AS NEEDED FOR MODERATE PAIN  . amLODipine (NORVASC) 10 MG tablet Take 1 tablet (10 mg total) by mouth daily.  Marland Kitchen aspirin EC 81 MG tablet Take 81 mg by mouth daily.  . Cholecalciferol (VITAMIN D-3 PO) Take 2 tablets by mouth daily.   . fluticasone (FLONASE) 50 MCG/ACT nasal spray Place 1 spray  into both nostrils daily.  . Pyridoxine HCl (VITAMIN B-6 PO) Take 100 mg by mouth daily.   . quinapril (ACCUPRIL) 40 MG tablet Take 40 mg by mouth at bedtime.  . [DISCONTINUED] Cetirizine HCl 10 MG CAPS Take 1 capsule (10 mg total) by mouth daily for 10 days.   No facility-administered encounter medications on file as of 10/22/2018.     Activities of Daily Living In your present state of health, do you have any difficulty performing the following activities: 10/22/2018  Hearing? N  Vision? N  Difficulty concentrating or making decisions? N  Walking or climbing stairs? Y  Dressing or bathing? N  Doing errands, shopping? N  Some recent data might be hidden    Patient Care Team: Glendale Chard, MD as PCP - General (Internal Medicine)   Assessment:   This is a routine wellness examination for Ryan Craig.  Goals   None     Fall Risk Fall Risk  10/22/2018 09/27/2018 05/15/2017  Falls in the past year? 1 0 Yes  Comment - - Emmi Telephone Survey: data to providers prior to load  Number falls in past yr: 0 - 1  Comment - - Emmi Telephone Survey Actual Response = 1  Injury with Fall? 0 - No   Depression Screen PHQ 2/9 Scores 10/22/2018 09/27/2018  PHQ - 2 Score 0 0    Cognitive Function- normal MMSE - Mini Mental State Exam 10/22/2018  Orientation to time 5  Orientation to Place 5  Registration 3  Attention/ Calculation 5  Language- name 2 objects 2  Language- read & follow direction 1  Write a sentence 1  Copy design 1        Immunization History  Administered Date(s) Administered  . Influenza, High Dose Seasonal PF 09/27/2018   Screening Tests Health Maintenance  Topic Date Due  . Hepatitis C Screening  [birth year]  . PNA vac Low Risk Adult (2 of 2 - PPSV23) 04/24/2014  . TETANUS/TDAP  04/25/2023  . COLONOSCOPY  08/27/2028  . INFLUENZA VACCINE  Completed         SYLVIA RODRIGUEZ-SOUTHWORTH, PA-C  10/24/2018    Subjective:     Patient ID: Ryan Craig ,  male    DOB: 07/01/1946 , 72 y.o.   MRN: [id number]   CC - here for Medicare visit and Physical  HPI  Pt is here for Medicare Wellness visit and complete physical.   Past Medical History:  Diagnosis  Date  . Anemia    no on any meds  . Arthritis   . Chronic back pain    deteriorating  . Hyperlipidemia    was on Lipitor but was taken off 3-23yrs ago by medical md bc running liver enzymes up  . Hypertension    takes Amlodipine and Quinapril daily  . Joint pain   . Joint swelling      Family History  Problem Relation Age of Onset  . Heart attack Mother   . Cancer Maternal Aunt   . Cancer Paternal Aunt   . Cancer Cousin      Current Outpatient Medications:  .  acetaminophen-codeine (TYLENOL #3) 300-30 MG tablet, TAKE 1 OR 2 TABLETS BY MOUTH EVERY 8 HOURS AS NEEDED FOR MODERATE PAIN, Disp: 40 tablet, Rfl: 0 .  amLODipine (NORVASC) 10 MG tablet, Take 1 tablet (10 mg total) by mouth daily., Disp: 30 tablet, Rfl: 0 .  aspirin EC 81 MG tablet, Take 81 mg by mouth daily., Disp: , Rfl:  .  Cholecalciferol (VITAMIN D-3 PO), Take 2 tablets by mouth daily. , Disp: , Rfl:  .  fluticasone (FLONASE) 50 MCG/ACT nasal spray, Place 1 spray into both nostrils daily., Disp: 16 g, Rfl: 2 .  Pyridoxine HCl (VITAMIN B-6 PO), Take 100 mg by mouth daily. , Disp: , Rfl:  .  quinapril (ACCUPRIL) 40 MG tablet, Take 40 mg by mouth at bedtime., Disp: , Rfl:    Allergies  Allergen Reactions  . Contrast Media [Iodinated Diagnostic Agents] Anaphylaxis  . Latex Rash     Review of Systems  Constitutional: Negative.   HENT: Negative.   Eyes: Negative.   Respiratory: Negative.   Cardiovascular: Negative.   Gastrointestinal: Negative.   Endocrine: Positive for cold intolerance.       Which he always feels cold  Genitourinary: Negative.   Musculoskeletal: Positive for back pain.       Which is chronic  Skin: Positive for rash.       Has a dry skin area for months, is not new  Allergic/Immunologic:  Negative.   Neurological: Negative.   Hematological: Negative.   Psychiatric/Behavioral: Negative.      Today's Vitals   10/22/18 0916  BP: 136/76  Pulse: 71  Temp: 97.8 F (36.6 C)  TempSrc: Oral  SpO2: 97%  Weight: 221 lb 9.6 oz (100.5 kg)  Height: 5\' 6"  (1.676 m)   Body mass index is 35.77 kg/m.   Objective:  Physical Exam  BP 136/76 (BP Location: Left Arm, Patient Position: Sitting, Cuff Size: Large)   Pulse 71   Temp 97.8 F (36.6 C) (Oral)   Ht 5\' 6"  (1.676 m)   Wt 221 lb 9.6 oz (100.5 kg)   SpO2 97%   BMI 35.77 kg/m   General Appearance:    Alert, cooperative, no distress, appears stated age  Head:    Normocephalic, without obvious abnormality, atraumatic  Eyes:    PERRL, conjunctiva/corneas clear, EOM's intact, both eyes       Ears:    Normal TM's and external ear canals, both ears  Nose:   Nares normal, septum midline, mucosa normal, no drainage   or sinus tenderness  Throat:   Lips, mucosa, and tongue normal; teeth and gums normal  Neck:   Supple, symmetrical, trachea midline, no adenopathy;       thyroid:  No enlargement/tenderness/nodules; no carotid   bruit   Back:     Symmetric, no curvature, ROM normal,  no CVA tenderness  Lungs:     Clear to auscultation bilaterally, respirations unlabored  Chest wall:    No tenderness or deformity  Heart:    Regular rate and rhythm, S1 and S2 normal, no murmur, rub   or gallop  Abdomen:     Soft, non-tender, bowel sounds active all four quadrants,    no masses, no organomegaly  Genitalia:  declined  Rectal:  declined  Extremities:   Extremities normal, atraumatic, no cyanosis or edema. Has enlarged knee joints with well healed scars.   Pulses:   2+ and symmetric all extremities  Skin:   Skin color, texture, turgor normal, no rashes or lesions  Lymph nodes:   Cervical, supraclavicular, and axillary nodes normal  Neurologic:   CNII-XII intact. Normal strength, sensation and reflexes      Throughout. Normal  [place]. Unable to do tandem gait   Assessment And Plan:     1. Encounter for Medicare annual wellness exam- routine. FU 1y  2. Screening for prostate cancer- screen. PSA ordered  3. Essential hypertension- stable. May continue same meds.  - POCT Urinalysis Dipstick (81002) - POCT UA - Microalbumin - EKG 12-Lead - CMP14 + Anion Gap - CBC no Diff - Lipid Profile - TSH - T4, Free - T3, free  4. General medical exam- routine. FU 1y - TSH - T4, Free - T3, free  5. Abnormal EKG- new R BBB which was not present on prior EKG from last year.  - Ambulatory referral to Cardiology   [facility], PA-C

## 2018-10-22 NOTE — Progress Notes (Addendum)
Subjective:   Ryan Craig is a 72 y.o. male who presents for Medicare Annual/Subsequent preventive examination.     Objective:    Vitals: BP 136/76 (BP Location: Left Arm, Patient Position: Sitting, Cuff Size: Large)   Pulse 71   Temp 97.8 F (36.6 C) (Oral)   Ht 5\' 6"  (1.676 m)   Wt 221 lb 9.6 oz (100.5 kg)   SpO2 97%   BMI 35.77 kg/m   Body mass index is 35.77 kg/m.  Advanced Directives 01/18/2015 12/23/2014 12/11/2014 02/24/2014  Does Patient Have a Medical Advance Directive? Yes Yes Yes Patient has advance directive, copy not in chart  Type of Advance Directive [place];Living will Living will Living will [place];Living will  Does patient want to make changes to medical advance directive? - No - Patient declined - -  Copy of [place] in Chart? - No - copy requested No - copy requested -    Tobacco Social History   Tobacco Use  Smoking Status Former Smoker  Smokeless Tobacco Former Systems developer  Tobacco Comment   quit smoking about 6-7ytrs ago     Counseling given: Not Answered Comment: quit smoking about 6-7ytrs ago   Past Medical History:  Diagnosis Date  . Anemia    no on any meds  . Arthritis   . Chronic back pain    deteriorating  . Hyperlipidemia    was on Lipitor but was taken off 3-44yrs ago by medical md bc running liver enzymes up  . Hypertension    takes Amlodipine and Quinapril daily  . Joint pain   . Joint swelling    Past Surgical History:  Procedure Laterality Date  . arthroscopic knee surgery Right   . COLONOSCOPY    . INGUINAL HERNIA REPAIR Bilateral    x 4  . JOINT REPLACEMENT Right 6 yrs ago   knee  . KNEE ARTHROSCOPY Left 02/24/2014   Procedure: LEFT KNEE ARTHROSCOPY WITH PARTIAL MEDIAL MENISCECTOMY;  Surgeon: Mcarthur Rossetti, MD;  Location: [place];  Service: Orthopedics;  Laterality: Left;  . TOTAL KNEE ARTHROPLASTY Left 12/22/2014   Procedure: LEFT TOTAL KNEE ARTHROPLASTY;   Surgeon: Mcarthur Rossetti, MD;  Location: [place];  Service: Orthopedics;  Laterality: Left;   Family History  Problem Relation Age of Onset  . Heart attack Mother   . Cancer Maternal Aunt   . Cancer Paternal Aunt   . Cancer Cousin    Social History   Socioeconomic History  . Marital status: Married    Spouse name: Not on file  . Number of children: Not on file  . Years of education: Not on file  . Highest education level: Not on file  Occupational History  . Not on file  Social Needs  . Financial resource strain: Not on file  . Food insecurity:    Worry: Not on file    Inability: Not on file  . Transportation needs:    Medical: Not on file    Non-medical: Not on file  Tobacco Use  . Smoking status: Former Research scientist (life sciences)  . Smokeless tobacco: Former Systems developer  . Tobacco comment: quit smoking about 6-7ytrs ago  Substance and Sexual Activity  . Alcohol use: No    Comment: drank over 40 years ago  . Drug use: No  . Sexual activity: Yes  Lifestyle  . Physical activity:    Days per week: Not on file    Minutes per session: Not on  file  . Stress: Not on file  Relationships  . Social connections:    Talks on phone: Not on file    Gets together: Not on file    Attends religious service: Not on file    Active member of club or organization: Not on file    Attends meetings of clubs or organizations: Not on file    Relationship status: Not on file  Other Topics Concern  . Not on file  Social History Narrative  . Not on file    Outpatient Encounter Medications as of 10/22/2018  Medication Sig  . acetaminophen-codeine (TYLENOL #3) 300-30 MG tablet TAKE 1 OR 2 TABLETS BY MOUTH EVERY 8 HOURS AS NEEDED FOR MODERATE PAIN  . amLODipine (NORVASC) 10 MG tablet Take 1 tablet (10 mg total) by mouth daily.  Marland Kitchen aspirin EC 81 MG tablet Take 81 mg by mouth daily.  . Cholecalciferol (VITAMIN D-3 PO) Take 2 tablets by mouth daily.   . fluticasone (FLONASE) 50 MCG/ACT nasal spray Place 1 spray  into both nostrils daily.  . Pyridoxine HCl (VITAMIN B-6 PO) Take 100 mg by mouth daily.   . quinapril (ACCUPRIL) 40 MG tablet Take 40 mg by mouth at bedtime.  . [DISCONTINUED] Cetirizine HCl 10 MG CAPS Take 1 capsule (10 mg total) by mouth daily for 10 days.   No facility-administered encounter medications on file as of 10/22/2018.     Activities of Daily Living In your present state of health, do you have any difficulty performing the following activities: 10/22/2018  Hearing? N  Vision? N  Difficulty concentrating or making decisions? N  Walking or climbing stairs? Y  Dressing or bathing? N  Doing errands, shopping? N  Some recent data might be hidden    Patient Care Team: Glendale Chard, MD as PCP - General (Internal Medicine)   Assessment:   This is a routine wellness examination for Ryan Craig.      Goals   None     Fall Risk Fall Risk  10/22/2018 09/27/2018 05/15/2017  Falls in the past year? 1 0 Yes  Comment - - Emmi Telephone Survey: data to providers prior to load  Number falls in past yr: 0 - 1  Comment - - Emmi Telephone Survey Actual Response = 1  Injury with Fall? 0 - No    Timed Get Up and Go Performed: < 30 seconds  Depression Screen PHQ 2/9 Scores 10/22/2018 09/27/2018  PHQ - 2 Score 0 0    Cognitive Function MMSE - Mini Mental State Exam 10/22/2018  Orientation to time 5  Orientation to Place 5  Registration 3  Attention/ Calculation 5  Language- name 2 objects 2  Language- read & follow direction 1  Write a sentence 1  Copy design 1        Immunization History  Administered Date(s) Administered  . Influenza, High Dose Seasonal PF 09/27/2018   Screening Tests Health Maintenance  Topic Date Due  . Hepatitis C Screening  Mar 28, 1946  . TETANUS/TDAP  12/09/1962  . PNA vac Low Risk Adult (1 of 2 - PCV13) 12/09/2008  . COLONOSCOPY  08/27/2028  . INFLUENZA VACCINE  Completed   Cancer Screenings: Colorectal: had colonoscopy 08/2018,  report pending.   Additional Screenings:  Hepatitis C Screening: done 09/04/17   UA- dip was neg.  Wilkes Potvin RODRIGUEZ-SOUTHWORTH, PA-C  10/22/2018

## 2018-10-23 LAB — CMP14 + ANION GAP
ALT: 13 IU/L (ref 0–44)
AST: 20 IU/L (ref 0–40)
Albumin/Globulin Ratio: 1.8 (ref 1.2–2.2)
Albumin: 4.2 g/dL (ref 3.5–4.8)
Alkaline Phosphatase: 72 IU/L (ref 39–117)
Anion Gap: 14 mmol/L (ref 10.0–18.0)
BUN/Creatinine Ratio: 15 (ref 10–24)
BUN: 14 mg/dL (ref 8–27)
Bilirubin Total: 0.6 mg/dL (ref 0.0–1.2)
CO2: 22 mmol/L (ref 20–29)
Calcium: 10.3 mg/dL — ABNORMAL HIGH (ref 8.6–10.2)
Chloride: 103 mmol/L (ref 96–106)
Creatinine, Ser: 0.92 mg/dL (ref 0.76–1.27)
GFR calc Af Amer: 94 mL/min/{1.73_m2} (ref >59)
GFR calc non Af Amer: 82 mL/min/{1.73_m2} (ref >59)
Globulin, Total: 2.3 g/dL (ref 1.5–4.5)
Glucose: 105 mg/dL — ABNORMAL HIGH (ref 65–99)
Potassium: 5 mmol/L (ref 3.5–5.2)
Sodium: 139 mmol/L (ref 134–144)
Total Protein: 6.5 g/dL (ref 6.0–8.5)

## 2018-10-23 LAB — TSH: TSH: 0.971 u[IU]/mL (ref 0.450–4.500)

## 2018-10-23 LAB — PSA, TOTAL AND FREE
PSA, Free Pct: 50 %
PSA, Free: 0.05 ng/mL
Prostate Specific Ag, Serum: 0.1 ng/mL (ref 0.0–4.0)

## 2018-10-23 LAB — LIPID PANEL
Chol/HDL Ratio: 3.6 ratio (ref 0.0–5.0)
Cholesterol, Total: 196 mg/dL (ref 100–199)
HDL: 55 mg/dL (ref >39)
LDL Calculated: 115 mg/dL — ABNORMAL HIGH (ref 0–99)
Triglycerides: 131 mg/dL (ref 0–149)
VLDL Cholesterol Cal: 26 mg/dL (ref 5–40)

## 2018-10-23 LAB — CBC
Hematocrit: 39.4 % (ref 37.5–51.0)
Hemoglobin: 13 g/dL (ref 13.0–17.7)
MCH: 27.8 pg (ref 26.6–33.0)
MCHC: 33 g/dL (ref 31.5–35.7)
MCV: 84 fL (ref 79–97)
Platelets: 327 10*3/uL (ref 150–450)
RBC: 4.68 x10E6/uL (ref 4.14–5.80)
RDW: 12.3 % (ref 12.3–15.4)
WBC: 6.8 10*3/uL (ref 3.4–10.8)

## 2018-10-23 LAB — T4, FREE: Free T4: 1.16 ng/dL (ref 0.82–1.77)

## 2018-10-23 LAB — T3, FREE: T3, Free: 3 pg/mL (ref 2.0–4.4)

## 2018-10-24 ENCOUNTER — Other Ambulatory Visit: Payer: Self-pay | Admitting: Internal Medicine

## 2018-10-24 NOTE — Progress Notes (Unsigned)
Please call pt and find out where he had his colonoscopy last month and request the report please.

## 2018-11-20 ENCOUNTER — Other Ambulatory Visit: Payer: Self-pay | Admitting: Internal Medicine

## 2018-11-20 NOTE — Progress Notes (Unsigned)
Please call pt and find out where he had his colonoscopy last month and request the report please.

## 2018-11-21 ENCOUNTER — Ambulatory Visit (INDEPENDENT_AMBULATORY_CARE_PROVIDER_SITE_OTHER): Payer: Medicare Other | Admitting: Cardiology

## 2018-11-21 ENCOUNTER — Encounter: Payer: Self-pay | Admitting: Cardiology

## 2018-11-21 VITALS — BP 136/80 | HR 70 | Ht 66.0 in | Wt 225.8 lb

## 2018-11-21 DIAGNOSIS — E669 Obesity, unspecified: Secondary | ICD-10-CM

## 2018-11-21 DIAGNOSIS — I444 Left anterior fascicular block: Secondary | ICD-10-CM | POA: Diagnosis not present

## 2018-11-21 DIAGNOSIS — I4510 Unspecified right bundle-branch block: Secondary | ICD-10-CM

## 2018-11-21 DIAGNOSIS — R9431 Abnormal electrocardiogram [ECG] [EKG]: Secondary | ICD-10-CM

## 2018-11-21 DIAGNOSIS — Z87891 Personal history of nicotine dependence: Secondary | ICD-10-CM

## 2018-11-21 DIAGNOSIS — I452 Bifascicular block: Secondary | ICD-10-CM

## 2018-11-21 NOTE — Patient Instructions (Addendum)
Medication Instructions:  The current medical regimen is effective;  continue present plan and medications.  If you need a refill on your cardiac medications before your next appointment, please call your pharmacy.   Testing: Your physician has requested that you have an echocardiogram. Echocardiography is a painless test that uses sound waves to create images of your heart. It provides your doctor with information about the size and shape of your heart and how well your heart's chambers and valves are working. This procedure takes approximately one hour. There are no restrictions for this procedure.  Follow-Up: At [facility], you and your health needs are our priority.  As part of our continuing mission to provide you with exceptional heart care, we have created designated Provider Care Teams.  These Care Teams include your primary Cardiologist (physician) and Advanced Practice Providers (APPs -  Physician Assistants and Nurse Practitioners) who all work together to provide you with the care you need, when you need it. You will need a follow up appointment in 12 months.  Please call our office 2 months in advance to schedule this appointment.  You may see Candee Furbish, MD or one of the following Advanced Practice Providers on your designated Care Team:   Truitt Merle, NP Cecilie Kicks, NP . Kathyrn Drown, NP  Thank you for choosing [facility]!!

## 2018-11-21 NOTE — Progress Notes (Signed)
Cardiology Office Note:    Date:  11/21/2018   ID:  Ryan Craig, DOB August 14, 1946, MRN [id number]  PCP:  Glendale Chard, MD  Cardiologist:  Candee Furbish, MD  Electrophysiologist:  None   Referring MD: Rodriguez-Southworth, S*     History of Present Illness:    Ryan Craig is a 73 y.o. male here for evaluation of abnormal EKG, newly discovered right bundle branch block and left anterior fascicular block.     He has had many prior visits to [facility] secondary to left knee pain.  His mother had heart attack in her 43s.  Hence family history of CAD. He quit smoking about 10 years ago.  Occasionally will feel fluttering-like sensation but nothing prolonged.  Overall he is not having any high risk symptoms such as syncope angina, significant shortness of breath.  He does walk [place] and sometimes does have to sit down at the top of the hill to rest because of shortness of breath but certainly this is not severe to him.  He is having no chest discomfort with these sensations.    Past Medical History:  Diagnosis Date  . Anemia    no on any meds  . Arthritis   . Chronic back pain    deteriorating  . Hyperlipidemia    was on Lipitor but was taken off 3-44yrs ago by medical md bc running liver enzymes up  . Hypertension    takes Amlodipine and Quinapril daily  . Joint pain   . Joint swelling     Past Surgical History:  Procedure Laterality Date  . arthroscopic knee surgery Right   . COLONOSCOPY    . INGUINAL HERNIA REPAIR Bilateral    x 4  . JOINT REPLACEMENT Right 6 yrs ago   knee  . KNEE ARTHROSCOPY Left 02/24/2014   Procedure: LEFT KNEE ARTHROSCOPY WITH PARTIAL MEDIAL MENISCECTOMY;  Surgeon: Mcarthur Rossetti, MD;  Location: [facility];  Service: Orthopedics;  Laterality: Left;  . TOTAL KNEE ARTHROPLASTY Left 12/22/2014   Procedure: LEFT TOTAL KNEE ARTHROPLASTY;  Surgeon: Mcarthur Rossetti, MD;  Location: [facility];  Service: Orthopedics;  Laterality:  Left;    Current Medications: Current Meds  Medication Sig  . amLODipine (NORVASC) 10 MG tablet Take 1 tablet (10 mg total) by mouth daily.  Marland Kitchen aspirin EC 81 MG tablet Take 81 mg by mouth daily.  . Cholecalciferol (VITAMIN D-3 PO) Take 2 tablets by mouth daily.   . fluticasone (FLONASE) 50 MCG/ACT nasal spray Place 1 spray into both nostrils daily.  . quinapril (ACCUPRIL) 40 MG tablet Take 40 mg by mouth at bedtime.     Allergies:   Contrast media [iodinated diagnostic agents] and Latex   Social History   Socioeconomic History  . Marital status: Married    Spouse name: Not on file  . Number of children: Not on file  . Years of education: Not on file  . Highest education level: Not on file  Occupational History  . Not on file  Social Needs  . Financial resource strain: Not on file  . Food insecurity:    Worry: Not on file    Inability: Not on file  . Transportation needs:    Medical: Not on file    Non-medical: Not on file  Tobacco Use  . Smoking status: Former Research scientist (life sciences)  . Smokeless tobacco: Former Systems developer  . Tobacco comment: quit smoking about 6-7ytrs ago  Substance and Sexual Activity  . Alcohol use: No  Comment: drank over 40 years ago  . Drug use: No  . Sexual activity: Yes  Lifestyle  . Physical activity:    Days per week: Not on file    Minutes per session: Not on file  . Stress: Not on file  Relationships  . Social connections:    Talks on phone: Not on file    Gets together: Not on file    Attends religious service: Not on file    Active member of club or organization: Not on file    Attends meetings of clubs or organizations: Not on file    Relationship status: Not on file  Other Topics Concern  . Not on file  Social History Narrative  . Not on file     Family History: The patient's family history includes Cancer in his cousin, maternal aunt, and paternal aunt; Heart attack in his mother.  ROS:   Please see the history of present illness.      Denies any fevers chills nausea vomiting syncope bleeding all other systems reviewed and are negative.  EKGs/Labs/Other Studies Reviewed:    The following studies were reviewed today: Prior office notes reviewed, EKG personally reviewed and interpreted, lab work reviewed  EKG:  EKG is not ordered today.  Prior EKG described as above right bundle branch block sinus rhythm left anterior fascicular block.  Recent Labs: 10/22/2018: ALT 13; BUN 14; Creatinine, Ser 0.92; Hemoglobin 13.0; Platelets 327; Potassium 5.0; Sodium 139; TSH 0.971  Recent Lipid Panel    Component Value Date/Time   CHOL 196 10/22/2018 1037   TRIG 131 10/22/2018 1037   HDL 55 10/22/2018 1037   CHOLHDL 3.6 10/22/2018 1037   LDLCALC 115 (H) 10/22/2018 1037    Physical Exam:    VS:  BP 136/80   Pulse 70   Ht 5\' 6"  (1.676 m)   Wt 225 lb 12.8 oz (102.4 kg)   BMI 36.45 kg/m     Wt Readings from Last 3 Encounters:  11/21/18 225 lb 12.8 oz (102.4 kg)  10/22/18 221 lb 9.6 oz (100.5 kg)  09/27/18 220 lb 12.8 oz (100.2 kg)     GEN:  Well nourished, well developed in no acute distress HEENT: Normal NECK: No JVD; No carotid bruits LYMPHATICS: No lymphadenopathy CARDIAC: RRR, no murmurs, rubs, gallops RESPIRATORY:  Clear to auscultation without rales, wheezing or rhonchi  ABDOMEN: Soft, non-tender, non-distended MUSCULOSKELETAL:  No edema; No deformity  SKIN: Warm and dry NEUROLOGIC:  Alert and oriented x 3 PSYCHIATRIC:  Normal affect   ASSESSMENT:    1. Abnormal EKG   2. Right bundle branch block   3. Left anterior fascicular block   4. Bifascicular block   5. Former smoker   6. Obesity (BMI 30-39.9)    PLAN:    In order of problems listed above:  Abnormal EKG with right bundle branch block, left anterior fascicular block, bifascicular block - His EKG does show signs of worsening conduction system.  Thankfully, he does not have any high risk symptoms such as frank syncope.  This may deteriorate in  the future to need for pacemaker implantation if significant deterioration continues or bradycardia occurs or frank syncope.  For now, try to avoid excessive AV nodal blocking agents.  Continue to monitor hypertension, lipids and promote exercise.  Last LDL was 115.  Not significantly elevated.  He does take a low-dose aspirin.  Knee pain has limited the past. Explained to him that a bundle branch block does not  correlate with a blocked artery.  Obviously if he starts to have any symptoms that are more worrisome for exertional angina he will let us know.  For the time being, continue with aggressive secondary prevention. -I will check an echocardiogram to ensure proper structure and function of his heart.  Essential hypertension -Amlodipine controlling.  Obesity -Continue to work on weight loss.  This will help his knees as well as his heart.  Former smoker - Likely contributing to his dyspnea.  Conditioning also playing a role.  Given his history, would like to see him back in 1 year.  Of course if his symptoms were to change, i.e. bradycardia, worsening shortness of breath, syncope he will let us know.  Medication Adjustments/Labs and Tests Ordered: Current medicines are reviewed at length with the patient today.  Concerns regarding medicines are outlined above.  Orders Placed This Encounter  Procedures  . EKG 12-Lead  . ECHOCARDIOGRAM COMPLETE   No orders of the defined types were placed in this encounter.   Patient Instructions  Medication Instructions:  The current medical regimen is effective;  continue present plan and medications.  If you need a refill on your cardiac medications before your next appointment, please call your pharmacy.   Testing: Your physician has requested that you have an echocardiogram. Echocardiography is a painless test that uses sound waves to create images of your heart. It provides your doctor with information about the size and shape of your heart  and how well your heart's chambers and valves are working. This procedure takes approximately one hour. There are no restrictions for this procedure.  Follow-Up: At [facility], you and your health needs are our priority.  As part of our continuing mission to provide you with exceptional heart care, we have created designated Provider Care Teams.  These Care Teams include your primary Cardiologist (physician) and Advanced Practice Providers (APPs -  Physician Assistants and Nurse Practitioners) who all work together to provide you with the care you need, when you need it. You will need a follow up appointment in 12 months.  Please call our office 2 months in advance to schedule this appointment.  You may see Candee Furbish, MD or one of the following Advanced Practice Providers on your designated Care Team:   Truitt Merle, NP Cecilie Kicks, NP . Kathyrn Drown, NP  Thank you for choosing [facility]!!         Signed, Candee Furbish, MD  11/21/2018 8:49 AM    [place]

## 2018-11-29 ENCOUNTER — Ambulatory Visit (HOSPITAL_COMMUNITY): Payer: Medicare Other | Attending: Cardiology

## 2018-11-29 DIAGNOSIS — R9431 Abnormal electrocardiogram [ECG] [EKG]: Secondary | ICD-10-CM | POA: Insufficient documentation

## 2018-12-04 ENCOUNTER — Other Ambulatory Visit: Payer: Self-pay | Admitting: Nurse Practitioner

## 2018-12-18 ENCOUNTER — Other Ambulatory Visit: Payer: Self-pay | Admitting: Nurse Practitioner

## 2018-12-21 ENCOUNTER — Other Ambulatory Visit: Payer: Self-pay | Admitting: Nurse Practitioner

## 2018-12-23 ENCOUNTER — Other Ambulatory Visit: Payer: Self-pay

## 2018-12-23 MED ORDER — TRIAMCINOLONE ACETONIDE 0.1 % EX CREA: TOPICAL_CREAM | CUTANEOUS | 0 refills | Status: DC

## 2018-12-23 NOTE — Telephone Encounter (Signed)
Pt is requesting a refill on this meds is this okay          Will file in chart as: triamcinolone cream (KENALOG) 0.1 %       Sig: APPLY TOPICALLY A THIN LAYER TO THE AFFECTED AREA(S) TWICE A DAY   Disp:  454 g  Refills:  0

## 2018-12-23 NOTE — Telephone Encounter (Signed)
Yes you may refill with 3 refills

## 2018-12-27 ENCOUNTER — Telehealth: Payer: Self-pay

## 2018-12-27 NOTE — Telephone Encounter (Signed)
Error

## 2019-01-13 ENCOUNTER — Other Ambulatory Visit: Payer: Self-pay | Admitting: Nurse Practitioner

## 2019-01-22 ENCOUNTER — Other Ambulatory Visit: Payer: Self-pay

## 2019-01-22 ENCOUNTER — Ambulatory Visit (INDEPENDENT_AMBULATORY_CARE_PROVIDER_SITE_OTHER): Payer: Medicare Other | Admitting: Nurse Practitioner

## 2019-01-22 ENCOUNTER — Encounter: Payer: Self-pay | Admitting: Nurse Practitioner

## 2019-01-22 VITALS — BP 130/78 | HR 70 | Temp 98.3°F | Ht 64.8 in | Wt 223.2 lb

## 2019-01-22 DIAGNOSIS — R05 Cough: Secondary | ICD-10-CM

## 2019-01-22 DIAGNOSIS — R053 Chronic cough: Secondary | ICD-10-CM

## 2019-01-22 DIAGNOSIS — I10 Essential (primary) hypertension: Secondary | ICD-10-CM | POA: Diagnosis not present

## 2019-01-22 DIAGNOSIS — I119 Hypertensive heart disease without heart failure: Secondary | ICD-10-CM | POA: Insufficient documentation

## 2019-01-22 DIAGNOSIS — R0689 Other abnormalities of breathing: Secondary | ICD-10-CM

## 2019-01-22 MED ORDER — AZITHROMYCIN 250 MG PO TABS: ORAL_TABLET | ORAL | 0 refills | Status: AC

## 2019-01-22 NOTE — Progress Notes (Signed)
Subjective:     Patient ID: Ryan Craig , male    DOB: 1946/01/28 , 73 y.o.   MRN: [id number]   Chief Complaint  Patient presents with  . Follow-up    went to heart specialist and he has a leak in his aorta   . Allergies    chest congestion    HPI  Congestion - chest congestion for the more than a month and a half.  Will have the chest congestion in am and pm.  Has not been exposed to anyone who has been ill.   Hypertension  This is a chronic problem. The current episode started more than 1 year ago. The problem is controlled. Pertinent negatives include no anxiety, chest pain, headaches, palpitations or shortness of breath. There are no associated agents to hypertension. Past treatments include ACE inhibitors and calcium channel blockers. The current treatment provides no improvement. There are no compliance problems.  There is no history of angina. There is no history of chronic renal disease.     Past Medical History:  Diagnosis Date  . Anemia    no on any meds  . Arthritis   . Chronic back pain    deteriorating  . Hyperlipidemia    was on Lipitor but was taken off 3-67yr ago by medical md bc running liver enzymes up  . Hypertension    takes Amlodipine and Quinapril daily  . Joint pain   . Joint swelling      Family History  Problem Relation Age of Onset  . Heart attack Mother   . Cancer Maternal Aunt   . Cancer Paternal Aunt   . Cancer Cousin      Current Outpatient Medications:  .  amLODipine (NORVASC) 10 MG tablet, TAKE 1 TABLET BY MOUTH  DAILY, Disp: 30 tablet, Rfl: 0 .  aspirin EC 81 MG tablet, Take 81 mg by mouth daily., Disp: , Rfl:  .  Cholecalciferol (VITAMIN D-3 PO), Take 2 tablets by mouth daily. , Disp: , Rfl:  .  fluticasone (FLONASE) 50 MCG/ACT nasal spray, Place 1 spray into both nostrils daily., Disp: 16 g, Rfl: 2 .  quinapril (ACCUPRIL) 40 MG tablet, TAKE 1 TABLET BY MOUTH  DAILY, Disp: 90 tablet, Rfl: 0 .  triamcinolone cream (KENALOG) 0.1 %,  APPLY TOPICALLY A THIN LAYER TO THE AFFECTED AREA(S) TWICE A DAY, Disp: 454 g, Rfl: 0   Allergies  Allergen Reactions  . Contrast Media [Iodinated Diagnostic Agents] Anaphylaxis  . Latex Rash     Review of Systems  Constitutional: Negative for fatigue.  Respiratory: Positive for cough. Negative for shortness of breath and wheezing.   Cardiovascular: Negative.  Negative for chest pain, palpitations and leg swelling.  Endocrine: Negative for polydipsia, polyphagia and polyuria.  Neurological: Negative for dizziness and headaches.     Today's Vitals   01/22/19 0915  BP: 130/78  Pulse: 70  Temp: 98.3 F (36.8 C)  TempSrc: Oral  SpO2: 95%  Weight: 223 lb 3.2 oz (101.2 kg)  Height: 5' 4.8" (1.646 m)   Body mass index is 37.37 kg/m.   Objective:  Physical Exam Vitals signs reviewed.  Constitutional:      Appearance: Normal appearance.  Neck:     Musculoskeletal: Normal range of motion.  Cardiovascular:     Rate and Rhythm: Normal rate and regular rhythm.     Pulses: Normal pulses.     Heart sounds: Normal heart sounds. No murmur.  Pulmonary:     Effort:  Pulmonary effort is normal. No respiratory distress.     Breath sounds: Normal breath sounds. No wheezing or rhonchi.  Skin:    General: Skin is warm and dry.     Capillary Refill: Capillary refill takes less than 2 seconds.  Neurological:     General: No focal deficit present.     Mental Status: He is alert and oriented to person, place, and time.  Psychiatric:        Mood and Affect: Mood normal.        Behavior: Behavior normal.        Thought Content: Thought content normal.        Judgment: Judgment normal.         Assessment And Plan:    1. Essential hypertension  Chronic, stable  Continue with current medication - BMP8+eGFR - Lipid Profile  2. Persistent cough  6 week history of persistent cough  Right upper lung with decreased breath sounds will treat with azithromycin - azithromycin  (ZITHROMAX Z-PAK) 250 MG tablet; Take 2 tablets (500 mg) on  Day 1,  followed by 1 tablet (250 mg) once daily on Days 2 through 5.  Dispense: 6 each; Refill: 0   [name], FNP

## 2019-01-23 ENCOUNTER — Other Ambulatory Visit: Payer: Self-pay | Admitting: Nurse Practitioner

## 2019-01-23 DIAGNOSIS — E875 Hyperkalemia: Secondary | ICD-10-CM

## 2019-01-23 LAB — BMP8+EGFR
BUN/Creatinine Ratio: 16 (ref 10–24)
BUN: 15 mg/dL (ref 8–27)
CO2: 20 mmol/L (ref 20–29)
Calcium: 10 mg/dL (ref 8.6–10.2)
Chloride: 102 mmol/L (ref 96–106)
Creatinine, Ser: 0.91 mg/dL (ref 0.76–1.27)
GFR calc Af Amer: 95 mL/min/{1.73_m2}
GFR calc non Af Amer: 82 mL/min/{1.73_m2}
Glucose: 104 mg/dL — ABNORMAL HIGH (ref 65–99)
Potassium: 5.4 mmol/L — ABNORMAL HIGH (ref 3.5–5.2)
Sodium: 140 mmol/L (ref 134–144)

## 2019-01-23 LAB — LIPID PANEL
Chol/HDL Ratio: 4 ratio (ref 0.0–5.0)
Cholesterol, Total: 214 mg/dL — ABNORMAL HIGH (ref 100–199)
HDL: 54 mg/dL
LDL Calculated: 135 mg/dL — ABNORMAL HIGH (ref 0–99)
Triglycerides: 123 mg/dL (ref 0–149)
VLDL Cholesterol Cal: 25 mg/dL (ref 5–40)

## 2019-01-30 ENCOUNTER — Other Ambulatory Visit: Payer: Self-pay

## 2019-01-30 ENCOUNTER — Other Ambulatory Visit: Payer: Medicare Other

## 2019-01-30 DIAGNOSIS — E875 Hyperkalemia: Secondary | ICD-10-CM | POA: Diagnosis not present

## 2019-01-31 LAB — BMP8+EGFR
BUN/Creatinine Ratio: 20 (ref 10–24)
BUN: 20 mg/dL (ref 8–27)
CO2: 15 mmol/L — ABNORMAL LOW (ref 20–29)
Calcium: 10.2 mg/dL (ref 8.6–10.2)
Chloride: 109 mmol/L — ABNORMAL HIGH (ref 96–106)
Creatinine, Ser: 1.01 mg/dL (ref 0.76–1.27)
GFR calc Af Amer: 84 mL/min/{1.73_m2} (ref >59)
GFR calc non Af Amer: 72 mL/min/{1.73_m2} (ref >59)
Glucose: 107 mg/dL — ABNORMAL HIGH (ref 65–99)
Potassium: 5.2 mmol/L (ref 3.5–5.2)
Sodium: 146 mmol/L — ABNORMAL HIGH (ref 134–144)

## 2019-02-08 ENCOUNTER — Other Ambulatory Visit: Payer: Self-pay | Admitting: Internal Medicine

## 2019-02-08 ENCOUNTER — Other Ambulatory Visit: Payer: Self-pay | Admitting: Nurse Practitioner

## 2019-03-13 ENCOUNTER — Other Ambulatory Visit: Payer: Self-pay | Admitting: Nurse Practitioner

## 2019-04-15 ENCOUNTER — Other Ambulatory Visit: Payer: Self-pay

## 2019-04-15 MED ORDER — AMLODIPINE BESYLATE 10 MG PO TABS: 10.0000 mg | ORAL_TABLET | Freq: Every day | ORAL | 0 refills | Status: DC

## 2019-04-19 ENCOUNTER — Other Ambulatory Visit: Payer: Self-pay | Admitting: Nurse Practitioner

## 2019-05-30 ENCOUNTER — Telehealth: Payer: Self-pay | Admitting: Orthopaedic Surgery

## 2019-05-30 NOTE — Telephone Encounter (Signed)
Pt called in said he is having a lot of arthirits pain in both hands and is wondering if there is something you can prescribe him for the pain.  Please have that sent to [facility] [address] in [place]. [phone]

## 2019-05-30 NOTE — Telephone Encounter (Signed)
IC advised would be next week due to Dr Ninfa Linden being out of office until that time.

## 2019-06-02 NOTE — Telephone Encounter (Signed)
For hand arthritis, I recommended he get Voltaren gel over[facility]the[facility]counter [facility] and rub this on his hands 2-3 times daily.  This is really the only thing that I recommend for hand arthritis and the pain from hand arthritis.

## 2019-06-02 NOTE — Telephone Encounter (Signed)
Patient aware of the below message  

## 2019-06-02 NOTE — Telephone Encounter (Signed)
See below

## 2019-07-06 ENCOUNTER — Other Ambulatory Visit: Payer: Self-pay | Admitting: Nurse Practitioner

## 2019-07-23 ENCOUNTER — Telehealth: Payer: Self-pay | Admitting: Nurse Practitioner

## 2019-07-23 NOTE — Chronic Care Management (AMB) (Signed)
Chronic Care Management   Note  07/23/2019 Name: Ryan Craig MRN: [id number] DOB: 1946/10/14  Ryan Craig is a 73 y.o. year old male who is a primary care patient of [name], [place]. I reached out to [name] by phone today in response to a referral sent by [name] health plan.     Ryan Craig was given information about Chronic Care Management services today including:  1. CCM service includes personalized support from designated clinical staff supervised by his physician, including individualized plan of care and coordination with other care providers 2. 24/7 contact phone numbers for assistance for urgent and routine care needs. 3. Service will only be billed when office clinical staff spend 20 minutes or more in a month to coordinate care. 4. Only one practitioner may furnish and bill the service in a calendar month. 5. The patient may stop CCM services at any time (effective at the end of the month) by phone call to the office staff. 6. The patient will be responsible for cost sharing (co-pay) of up to 20% of the service fee (after annual deductible is met).  Patient agreed to services and verbal consent obtained.   Follow up plan: Telephone appointment with CCM team member scheduled for: 08/22/2019  [place]  [email or web address]   ??[phone]

## 2019-07-23 NOTE — Chronic Care Management (AMB) (Signed)
°  Chronic Care Management   Outreach Note  07/23/2019 Name: Ryan Craig MRN: [id number] DOB: July 17, 1946  Referred by: [name], FNP Reason for referral : Chronic Care Management (Initial CCM outreach was unsuccessful. )   An unsuccessful telephone outreach was attempted today. The patient was referred to the case management team by for assistance with care management and care coordination.   Follow Up Plan: A HIPPA compliant phone message was left for the patient providing contact information and requesting a return call.  The care management team will reach out to the patient again over the next 7 days.  If patient returns call to provider office, please advise to call [place] [facility] [place]  [email or web address]   ??[phone]

## 2019-07-24 ENCOUNTER — Ambulatory Visit: Payer: Medicare Other | Admitting: Nurse Practitioner

## 2019-07-30 NOTE — Chronic Care Management (AMB) (Signed)
Chronic Care Management  ° °Note ° °07/30/2019 °Name: Ryan Craig MRN: [id number] DOB: 07/28/1946 ° °Ryan Craig is a 73 y.o. year old male who is a primary care patient of Moore, Janece, FNP. I reached out to Dashaun Batterson by phone today in response to a referral sent by [name]'s health plan.    ° °Mr. Riede was given information about Chronic Care Management services today including:  °1. CCM service includes personalized support from designated clinical staff supervised by his physician, including individualized plan of care and coordination with other care providers °2. 24/7 contact phone numbers for assistance for urgent and routine care needs. °3. Service will only be billed when office clinical staff spend 20 minutes or more in a month to coordinate care. °4. Only one practitioner may furnish and bill the service in a calendar month. °5. The patient may stop CCM services at any time (effective at the end of the month) by phone call to the office staff. °6. The patient will be responsible for cost sharing (co-pay) of up to 20% of the service fee (after annual deductible is met). ° °Patient did not agree to enrollment in care management services and does not wish to consider at this time. ° °Follow up plan: °The patient has been provided with contact information for the chronic care management team and has been advised to call with any health related questions or concerns.  ° °Bernice Cicero °Care Guide • [facility] °[place]   [facility]  °[email or web address]   ??[phone]   ° ° ° °

## 2019-08-05 ENCOUNTER — Other Ambulatory Visit: Payer: Self-pay

## 2019-08-05 ENCOUNTER — Encounter: Payer: Self-pay | Admitting: Nurse Practitioner

## 2019-08-05 ENCOUNTER — Ambulatory Visit (INDEPENDENT_AMBULATORY_CARE_PROVIDER_SITE_OTHER): Payer: Medicare Other | Admitting: Internal Medicine

## 2019-08-05 VITALS — BP 122/70 | HR 65 | Temp 97.9°F | Ht 64.8 in | Wt 225.0 lb

## 2019-08-05 DIAGNOSIS — Z23 Encounter for immunization: Secondary | ICD-10-CM | POA: Diagnosis not present

## 2019-08-05 DIAGNOSIS — R7309 Other abnormal glucose: Secondary | ICD-10-CM

## 2019-08-05 DIAGNOSIS — I10 Essential (primary) hypertension: Secondary | ICD-10-CM

## 2019-08-05 DIAGNOSIS — R351 Nocturia: Secondary | ICD-10-CM | POA: Diagnosis not present

## 2019-08-05 DIAGNOSIS — Z6837 Body mass index (BMI) 37.0-37.9, adult: Secondary | ICD-10-CM

## 2019-08-05 DIAGNOSIS — E7800 Pure hypercholesterolemia, unspecified: Secondary | ICD-10-CM

## 2019-08-05 NOTE — Progress Notes (Signed)
Subjective:     Patient ID: Ryan Craig , male    DOB: 04-Nov-1945 , 73 y.o.   MRN: [id number]   Chief Complaint  Patient presents with  . Hypertension    HPI  Hypertension This is a chronic problem. The current episode started more than 1 year ago. The problem has been gradually improving since onset. The problem is controlled. Pertinent negatives include no blurred vision, chest pain, palpitations or shortness of breath. Past treatments include ACE inhibitors. The current treatment provides moderate improvement.     Past Medical History:  Diagnosis Date  . Anemia    no on any meds  . Arthritis   . Chronic back pain    deteriorating  . Hyperlipidemia    was on Lipitor but was taken off 3-36yr ago by medical md bc running liver enzymes up  . Hypertension    takes Amlodipine and Quinapril daily  . Joint pain   . Joint swelling      Family History  Problem Relation Age of Onset  . Heart attack Mother   . Cancer Maternal Aunt   . Cancer Paternal Aunt   . Cancer Cousin      Current Outpatient Medications:  .  amLODipine (NORVASC) 10 MG tablet, TAKE 1 TABLET BY MOUTH  DAILY, Disp: 30 tablet, Rfl: 2 .  aspirin EC 81 MG tablet, Take 81 mg by mouth daily., Disp: , Rfl:  .  Cholecalciferol (VITAMIN D-3 PO), Take 2 tablets by mouth daily. , Disp: , Rfl:  .  fluticasone (FLONASE) 50 MCG/ACT nasal spray, Place 1 spray into both nostrils daily. (Patient not taking: Reported on 08/05/2019), Disp: 16 g, Rfl: 2 .  quinapril (ACCUPRIL) 40 MG tablet, TAKE 1 TABLET BY MOUTH  DAILY, Disp: 90 tablet, Rfl: 0 .  triamcinolone cream (KENALOG) 0.1 %, APPLY TOPICALLY A THIN LAYER TO THE AFFECTED AREA(S) TWICE A DAY, Disp: 454 g, Rfl: 0   Allergies  Allergen Reactions  . Contrast Media [Iodinated Diagnostic Agents] Anaphylaxis  . Latex Rash     Review of Systems  Constitutional: Negative.   Eyes: Negative for blurred vision.  Respiratory: Negative.  Negative for shortness of breath.    Cardiovascular: Negative.  Negative for chest pain and palpitations.  Gastrointestinal: Negative.   Genitourinary:       He reports having nocturia. 1-2 times per night. Wants to have prostate bloodwork done.   Neurological: Negative.   Psychiatric/Behavioral: Negative.      Today's Vitals   08/05/19 0841  BP: 122/70  Pulse: 65  Temp: 97.9 F (36.6 C)  TempSrc: Oral  Weight: 225 lb (102.1 kg)  Height: 5' 4.8" (1.646 m)  PainSc: 0-No pain   Body mass index is 37.67 kg/m.   Objective:  Physical Exam Vitals signs and nursing note reviewed.  Constitutional:      Appearance: Normal appearance.  Cardiovascular:     Rate and Rhythm: Normal rate and regular rhythm.     Heart sounds: Normal heart sounds.  Pulmonary:     Effort: Pulmonary effort is normal.     Breath sounds: Normal breath sounds.  Skin:    General: Skin is warm.  Neurological:     General: No focal deficit present.     Mental Status: He is alert.  Psychiatric:        Mood and Affect: Mood normal.         Assessment And Plan:     1. Essential hypertension  Chronic,  well controlled. He will continue with current meds. He is encouraged to avoid adding salt to his foods.  He will rto in Jan 2021 for his next physical examination.   - CMP14+EGFR  2. Pure hypercholesterolemia  We reviewed his previous labwork. I will check his thyroid fxn today. He is encouraged to increase his daily activity, avoid fried foods and increase his fiber intake.   - TSH  3. Other abnormal glucose  HIS A1C HAS BEEN ELEVATED IN THE PAST. I WILL CHECK AN A1C, BMET TODAY. HE WAS ENCOURAGED TO AVOID SUGARY BEVERAGES AND PROCESSED FOODS INCLUDNG BREADS, RICE AND PASTA.  - Hemoglobin A1c  4. Nocturia  I will check PSA as requested. He is advised to stop drinking fluids at least 2 hours prior to bedtime.  - PSA  5. Class 2 severe obesity due to excess calories with serious comorbidity and body mass index (BMI) of 37.0 to  37.9 in adult [facility])  He is encouraged to strive for BMI less than 32 to decrease cardiac risk. He is encouraged to increase daily activity and avoid sugary beverages.   6. Need for influenza vaccination  - Flu vaccine HIGH DOSE PF (Fluzone High dose)        Ryan Greenland, MD    THE PATIENT IS ENCOURAGED TO PRACTICE SOCIAL DISTANCING DUE TO THE COVID-19 PANDEMIC.

## 2019-08-06 LAB — PSA: Prostate Specific Ag, Serum: 0.1 ng/mL (ref 0.0–4.0)

## 2019-08-06 LAB — CMP14+EGFR
ALT: 13 IU/L (ref 0–44)
AST: 16 IU/L (ref 0–40)
Albumin/Globulin Ratio: 2 (ref 1.2–2.2)
Albumin: 4.2 g/dL (ref 3.7–4.7)
Alkaline Phosphatase: 72 IU/L (ref 39–117)
BUN/Creatinine Ratio: 18 (ref 10–24)
BUN: 18 mg/dL (ref 8–27)
Bilirubin Total: 0.7 mg/dL (ref 0.0–1.2)
CO2: 23 mmol/L (ref 20–29)
Calcium: 10.1 mg/dL (ref 8.6–10.2)
Chloride: 103 mmol/L (ref 96–106)
Creatinine, Ser: 1.01 mg/dL (ref 0.76–1.27)
GFR calc Af Amer: 84 mL/min/{1.73_m2} (ref >59)
GFR calc non Af Amer: 72 mL/min/{1.73_m2} (ref >59)
Globulin, Total: 2.1 g/dL (ref 1.5–4.5)
Glucose: 110 mg/dL — ABNORMAL HIGH (ref 65–99)
Potassium: 4.9 mmol/L (ref 3.5–5.2)
Sodium: 139 mmol/L (ref 134–144)
Total Protein: 6.3 g/dL (ref 6.0–8.5)

## 2019-08-06 LAB — HEMOGLOBIN A1C
Est. average glucose Bld gHb Est-mCnc: 111 mg/dL
Hgb A1c MFr Bld: 5.5 % (ref 4.8–5.6)

## 2019-08-06 LAB — TSH: TSH: 1.01 u[IU]/mL (ref 0.450–4.500)

## 2019-08-07 ENCOUNTER — Telehealth: Payer: Self-pay

## 2019-08-07 NOTE — Telephone Encounter (Signed)
Left the patient a message to call back for lab results. 

## 2019-08-07 NOTE — Telephone Encounter (Signed)
-----   Message from Glendale Chard, MD sent at 08/06/2019  7:03 PM EDT ----- Your thyroid fxn is nl. Your liver and kidney fxn are stable. Your hba1c is 5.5, you are not prediabetic.  Your PSA is stable at 0.1.

## 2019-08-14 DIAGNOSIS — Z91041 Radiographic dye allergy status: Secondary | ICD-10-CM | POA: Diagnosis not present

## 2019-08-14 DIAGNOSIS — Z7982 Long term (current) use of aspirin: Secondary | ICD-10-CM | POA: Diagnosis not present

## 2019-08-14 DIAGNOSIS — S52612A Displaced fracture of left ulna styloid process, initial encounter for closed fracture: Secondary | ICD-10-CM | POA: Diagnosis not present

## 2019-08-14 DIAGNOSIS — I10 Essential (primary) hypertension: Secondary | ICD-10-CM | POA: Diagnosis not present

## 2019-08-14 DIAGNOSIS — S52602A Unspecified fracture of lower end of left ulna, initial encounter for closed fracture: Secondary | ICD-10-CM | POA: Diagnosis not present

## 2019-08-14 DIAGNOSIS — Z79899 Other long term (current) drug therapy: Secondary | ICD-10-CM | POA: Diagnosis not present

## 2019-08-14 DIAGNOSIS — M25532 Pain in left wrist: Secondary | ICD-10-CM | POA: Diagnosis not present

## 2019-08-14 DIAGNOSIS — S52502A Unspecified fracture of the lower end of left radius, initial encounter for closed fracture: Secondary | ICD-10-CM | POA: Diagnosis not present

## 2019-08-14 DIAGNOSIS — Z91040 Latex allergy status: Secondary | ICD-10-CM | POA: Diagnosis not present

## 2019-08-14 DIAGNOSIS — S52572A Other intraarticular fracture of lower end of left radius, initial encounter for closed fracture: Secondary | ICD-10-CM | POA: Diagnosis not present

## 2019-08-21 ENCOUNTER — Other Ambulatory Visit: Payer: Self-pay

## 2019-08-21 ENCOUNTER — Ambulatory Visit (INDEPENDENT_AMBULATORY_CARE_PROVIDER_SITE_OTHER): Payer: Medicare Other | Admitting: Physician Assistant

## 2019-08-21 ENCOUNTER — Encounter: Payer: Self-pay | Admitting: Physician Assistant

## 2019-08-21 ENCOUNTER — Ambulatory Visit (INDEPENDENT_AMBULATORY_CARE_PROVIDER_SITE_OTHER): Payer: Medicare Other

## 2019-08-21 DIAGNOSIS — M25532 Pain in left wrist: Secondary | ICD-10-CM | POA: Diagnosis not present

## 2019-08-21 DIAGNOSIS — S62102A Fracture of unspecified carpal bone, left wrist, initial encounter for closed fracture: Secondary | ICD-10-CM

## 2019-08-21 MED ORDER — HYDROCODONE-ACETAMINOPHEN 5-325 MG PO TABS: 1.0000 | ORAL_TABLET | ORAL | 0 refills | Status: DC | PRN

## 2019-08-21 NOTE — Progress Notes (Signed)
Office Visit Note   Patient: Ryan Craig           Date of Birth: 1945-12-08           MRN: [id number] Visit Date: 08/21/2019              Requested by: [name], [address] [place],  [place] [address] PCP: [name], FNP   Assessment & Plan: Visit Diagnoses:  1. Pain in left wrist   2. Closed fracture of left wrist, initial encounter     Plan: We placed him in a well-padded short arm cast.  Did discuss with him surgical versus conservative treatment he would rather stay away from surgery if possible.  Elevation wiggling fingers encouraged.  He is to keep cast clean dry intact.  Follow-up with [place] in 3 weeks we will remove the cast and obtain 3 views of the left wrist.  He is given Norco for pain.  Follow-Up Instructions: Return in about 3 weeks (around 09/11/2019).   Orders:  Orders Placed This Encounter  Procedures  . XR Wrist Complete Left   Meds ordered this encounter  Medications  . HYDROcodone-acetaminophen (NORCO) 5-325 MG tablet    Sig: Take 1 tablet by mouth every 4 (four) hours as needed for moderate pain. One to two tabs every 4-6 hours for pain    Dispense:  30 tablet    Refill:  0      Procedures: No procedures performed   Clinical Data: No additional findings.   Subjective: Chief Complaint  Patient presents with  . Left Wrist - Fracture    HPI Ryan Craig is well-known to Dr. Trevor Mace service he had mechanical fall 2 weeks ago while in the [place] area.  He was unloading some sweet potatoes whenever he fell.  No loss of consciousness dizziness.  He was seen in the [facility] ER where radiographs were obtained and had distal left wrist fracture nonarticular.  He does report that the ER did reduce the fracture.  He was placed in a sugar tong splint.  Told to follow-up with orthopedic surgery.  He denies any other injury.  He is right-hand dominant.  Review of Systems  Constitutional: Negative for chills and fever.   Respiratory: Negative for shortness of breath.   Cardiovascular: Negative for chest pain.  Neurological: Negative for dizziness.     Objective: Vital Signs: There were no vitals taken for this visit.  Physical Exam Constitutional:      Appearance: He is not ill-appearing or diaphoretic.  Pulmonary:     Effort: Pulmonary effort is normal.  Neurological:     Mental Status: He is alert and oriented to person, place, and time.  Psychiatric:        Mood and Affect: Mood normal.        Behavior: Behavior normal.     Ortho Exam Left wrist he has tenderness over the distal radius ulna.  Radial pulse intact.  Sensation intact throughout the fingers.  He has full motor of the left hand.  Nontender at the elbow and proximal radius ulna. Specialty Comments:  No specialty comments available.  Imaging: Xr Wrist Complete Left  Result Date: 08/21/2019 Left wrist 3 views: Distal radial shaft fracture no intra-articular involvement.  Distal styloid the ulna is also fractured and displaced.  Lateral view shows overall neutral alignment.  Some radial deviation of the distal radius.  Otherwise no fractures or involvement of the carpal bones.  PMFS History: Patient Active Problem List   Diagnosis Date Noted  . Essential hypertension 01/22/2019  . Persistent cough 01/22/2019  . Right bundle branch block 11/21/2018  . Left anterior fascicular block 11/21/2018  . Former smoker 11/21/2018  . Obesity (BMI 30-39.9) 11/21/2018  . Need for vaccination 09/27/2018  . Osteoarthritis of left knee 12/22/2014  . Status post total left knee replacement 12/22/2014  . Acute medial meniscus tear of left knee 02/24/2014   Past Medical History:  Diagnosis Date  . Anemia    no on any meds  . Arthritis   . Chronic back pain    deteriorating  . Hyperlipidemia    was on Lipitor but was taken off 3-60yrs ago by medical md bc running liver enzymes up  . Hypertension    takes Amlodipine and Quinapril  daily  . Joint pain   . Joint swelling     Family History  Problem Relation Age of Onset  . Heart attack Mother   . Cancer Maternal Aunt   . Cancer Paternal Aunt   . Cancer Cousin     Past Surgical History:  Procedure Laterality Date  . arthroscopic knee surgery Right   . COLONOSCOPY    . INGUINAL HERNIA REPAIR Bilateral    x 4  . JOINT REPLACEMENT Right 6 yrs ago   knee  . KNEE ARTHROSCOPY Left 02/24/2014   Procedure: LEFT KNEE ARTHROSCOPY WITH PARTIAL MEDIAL MENISCECTOMY;  Surgeon: Mcarthur Rossetti, MD;  Location: [place];  Service: Orthopedics;  Laterality: Left;  . TOTAL KNEE ARTHROPLASTY Left 12/22/2014   Procedure: LEFT TOTAL KNEE ARTHROPLASTY;  Surgeon: Mcarthur Rossetti, MD;  Location: [facility];  Service: Orthopedics;  Laterality: Left;   Social History   Occupational History  . Not on file  Tobacco Use  . Smoking status: Former Research scientist (life sciences)  . Smokeless tobacco: Former Systems developer  . Tobacco comment: quit smoking about 6-7ytrs ago  Substance and Sexual Activity  . Alcohol use: No    Comment: drank over 40 years ago  . Drug use: No  . Sexual activity: Yes

## 2019-08-22 ENCOUNTER — Telehealth: Payer: Self-pay | Admitting: Physician Assistant

## 2019-08-22 ENCOUNTER — Other Ambulatory Visit: Payer: Self-pay | Admitting: Orthopaedic Surgery

## 2019-08-22 MED ORDER — HYDROCODONE-ACETAMINOPHEN 5-325 MG PO TABS: 1.0000 | ORAL_TABLET | Freq: Four times a day (QID) | ORAL | 0 refills | Status: DC | PRN

## 2019-08-22 NOTE — Telephone Encounter (Signed)
Pt called in said his pharmacy had no power yesterday and didn't revived the prescription that [facility] sent in for hydrocodone, he's wondering if we can send that again to the [facility] pharmacy on [address].   [phone]

## 2019-08-22 NOTE — Telephone Encounter (Signed)
Please advise 

## 2019-08-25 NOTE — Telephone Encounter (Signed)
DR. Ninfa Linden SENT IN ON 08/22/2019

## 2019-08-26 ENCOUNTER — Other Ambulatory Visit: Payer: Self-pay | Admitting: Nurse Practitioner

## 2019-09-11 ENCOUNTER — Encounter: Payer: Self-pay | Admitting: Orthopaedic Surgery

## 2019-09-11 ENCOUNTER — Other Ambulatory Visit: Payer: Self-pay

## 2019-09-11 ENCOUNTER — Ambulatory Visit (INDEPENDENT_AMBULATORY_CARE_PROVIDER_SITE_OTHER): Payer: Medicare Other | Admitting: Orthopaedic Surgery

## 2019-09-11 ENCOUNTER — Ambulatory Visit (INDEPENDENT_AMBULATORY_CARE_PROVIDER_SITE_OTHER): Payer: Medicare Other

## 2019-09-11 DIAGNOSIS — S62102D Fracture of unspecified carpal bone, left wrist, subsequent encounter for fracture with routine healing: Secondary | ICD-10-CM | POA: Diagnosis not present

## 2019-09-11 MED ORDER — HYDROCODONE-ACETAMINOPHEN 5-325 MG PO TABS: 1.0000 | ORAL_TABLET | Freq: Four times a day (QID) | ORAL | 0 refills | Status: DC | PRN

## 2019-09-11 NOTE — Progress Notes (Signed)
The patient is a 73 year old right-hand-dominant male who is following up for a left extra-articular distal radius fracture and ulnar styloid fracture.  He was first placed in a splint and had the price reduced in [facility].  We then saw him in follow-up.  We placed in a short arm cast.  He is not a very active individual and we did not feel like he would do well in a long-arm cast.  He had already lost some of the reduction.  We talked about the possibility of surgery but he deferred this and we agree with that decision as well.  He is doing well overall.  He is still needing hydrocodone to help with his pain and I agree with this as well.  On exam he still has abundant swelling of his left distal radius.  There is a lot of dry skin around his wrist and hand.  His hand is well-perfused.  He is able to pronate and supinate the wrist.  His dorsiflexion and palmar flexion is limited.  2 views of the left distal radius show that there is then shortening of the distal radius fracture that is extra-articular.  There is dorsal angulation as well.  We went over his x-rays and had not really changed from his last visit.  I am comfortable with placing him and a Velcro wrist splint today.  He can work on gentle motion of his wrist.  I will see him back in 4 weeks for repeat 2 views of the left distal radius.  I will send in some more hydrocodone for him as well.

## 2019-10-09 ENCOUNTER — Ambulatory Visit (INDEPENDENT_AMBULATORY_CARE_PROVIDER_SITE_OTHER): Payer: Medicare Other | Admitting: Orthopaedic Surgery

## 2019-10-09 ENCOUNTER — Other Ambulatory Visit: Payer: Self-pay

## 2019-10-09 ENCOUNTER — Ambulatory Visit (INDEPENDENT_AMBULATORY_CARE_PROVIDER_SITE_OTHER): Payer: Medicare Other

## 2019-10-09 ENCOUNTER — Encounter: Payer: Self-pay | Admitting: Orthopaedic Surgery

## 2019-10-09 DIAGNOSIS — S62102D Fracture of unspecified carpal bone, left wrist, subsequent encounter for fracture with routine healing: Secondary | ICD-10-CM

## 2019-10-09 NOTE — Progress Notes (Signed)
Ryan Craig is about 7 weeks out from a closed extra-articular fracture to his left distal radius.  This is his nondominant side.  He was first treated in a cast and now he is in a removable wrist splint.  He sometimes does not wear it at night.  He reports improved range of motion as well as decreased pain and swelling.  On exam there is still more swelling in his wrist and hand on his left side than the right side.  I can flex and extend his wrist but not fully.  He is not able to make a full fist just yet.  2 views of the left distal radius show an extra-articular fracture that is neutral on the lateral side.  There is slight shortening.  There is been some healing but is not healed completely.  There is also an ulnar styloid fracture.  He will continue increase his activities as he tolerates but avoid heavy gripping and lifting activities.  I have recommended a squeeze ball that he can squeeze to work on getting his range of motion back in his fingers.  He will work on gentle range of motion exercises of the wrist.  He can come in and out of the wrist splint as comfort allows.  We will see him back for a potentially final visit in about 4 weeks with repeat 2 views of the left wrist.  All question concerns were answered and addressed.

## 2019-10-13 NOTE — Progress Notes (Signed)
Patient is coming in on the 12th [facility]

## 2019-10-22 ENCOUNTER — Telehealth: Payer: Self-pay | Admitting: Nurse Practitioner

## 2019-10-22 ENCOUNTER — Other Ambulatory Visit: Payer: Self-pay

## 2019-10-22 MED ORDER — AMLODIPINE BESYLATE 10 MG PO TABS: 10.0000 mg | ORAL_TABLET | Freq: Every day | ORAL | 0 refills | Status: DC

## 2019-10-22 NOTE — Telephone Encounter (Signed)
I called the patient to reschedule 11/04/2019 AWV with Pamala Hurry.  He didn't want to come in to office twice and doesn't have internet/smartphone, so he requested telephone/audio AWV.

## 2019-10-29 ENCOUNTER — Ambulatory Visit (INDEPENDENT_AMBULATORY_CARE_PROVIDER_SITE_OTHER): Payer: Medicare Other

## 2019-10-29 ENCOUNTER — Other Ambulatory Visit: Payer: Self-pay

## 2019-10-29 ENCOUNTER — Ambulatory Visit: Payer: Medicare Other | Admitting: Nurse Practitioner

## 2019-10-29 ENCOUNTER — Ambulatory Visit: Payer: Medicare Other

## 2019-10-29 VITALS — Temp 97.9°F | Ht 68.0 in | Wt 210.0 lb

## 2019-10-29 DIAGNOSIS — Z0000 Encounter for general adult medical examination without abnormal findings: Secondary | ICD-10-CM

## 2019-10-29 NOTE — Progress Notes (Signed)
This visit type was conducted due to national recommendations for restrictions regarding the COVID-19 Pandemic (e.g. social distancing). This format is felt to be most appropriate for this patient at this time. All issues noted in this document were discussed and addressed. No physical exam was performed (except for noted visual exam findings with Video Visits). This patient, [name], has given permission to perform this visit via telephone. Vital signs may be absent or patient reported.  Patient location:  At home  Nurse location:  [place] office    Subjective:   Ryan Craig is a 74 y.o. male who presents for Medicare Annual/Subsequent preventive examination.  Review of Systems:  n/a Cardiac Risk Factors include: advanced age (>60men, >82 women);male gender;hypertension;obesity (BMI >30kg/m2);sedentary lifestyle     Objective:    Vitals: Temp 97.9 F (36.6 C) Comment: per patient  Ht 5\' 8"  (1.727 m) Comment: per patient  Wt 210 lb (95.3 kg) Comment: per patient  BMI 31.93 kg/m   Body mass index is 31.93 kg/m.  Advanced Directives 10/29/2019 01/18/2015 12/23/2014 12/11/2014 02/24/2014  Does Patient Have a Medical Advance Directive? Yes Yes Yes Yes Patient has advance directive, copy not in chart  Type of Advance Directive Living will [place];Living will Living will Living will [place];Living will  Does patient want to make changes to medical advance directive? - - No - Patient declined - -  Copy of [place] in Chart? - - No - copy requested No - copy requested -    Tobacco Social History   Tobacco Use  Smoking Status Former Smoker  Smokeless Tobacco Former Systems developer  Tobacco Comment   quit smoking about 6-7ytrs ago     Counseling given: Not Answered Comment: quit smoking about 6-7ytrs ago   Clinical Intake:  Pre-visit preparation completed: Yes  Pain : 0-10 Pain Score: 7  Pain Type: Chronic pain Pain  Location: Hand Pain Orientation: Left, Right Pain Radiating Towards: none Pain Descriptors / Indicators: Aching Pain Onset: More than a month ago Pain Frequency: Intermittent Pain Relieving Factors: voltaren  Pain Relieving Factors: voltaren  Nutritional Status: BMI > 30  Obese Nutritional Risks: None Diabetes: No  How often do you need to have someone help you when you read instructions, pamphlets, or other written materials from your doctor or pharmacy?: 1 - Never What is the last grade level you completed in school?: 11th grade  Interpreter Needed?: No  Information entered by :: NAllen LPN  Past Medical History:  Diagnosis Date  . Anemia    no on any meds  . Arthritis   . Chronic back pain    deteriorating  . Hyperlipidemia    was on Lipitor but was taken off 3-54yrs ago by medical md bc running liver enzymes up  . Hypertension    takes Amlodipine and Quinapril daily  . Joint pain   . Joint swelling    Past Surgical History:  Procedure Laterality Date  . arthroscopic knee surgery Right   . COLONOSCOPY    . INGUINAL HERNIA REPAIR Bilateral    x 4  . JOINT REPLACEMENT Right 6 yrs ago   knee  . KNEE ARTHROSCOPY Left 02/24/2014   Procedure: LEFT KNEE ARTHROSCOPY WITH PARTIAL MEDIAL MENISCECTOMY;  Surgeon: Mcarthur Rossetti, MD;  Location: [facility];  Service: Orthopedics;  Laterality: Left;  . TOTAL KNEE ARTHROPLASTY Left 12/22/2014   Procedure: LEFT TOTAL KNEE ARTHROPLASTY;  Surgeon: Mcarthur Rossetti, MD;  Location:  [facility];  Service: Orthopedics;  Laterality: Left;   Family History  Problem Relation Age of Onset  . Heart attack Mother   . Cancer Maternal Aunt   . Cancer Paternal Aunt   . Cancer Cousin    Social History   Socioeconomic History  . Marital status: Married    Spouse name: Not on file  . Number of children: Not on file  . Years of education: Not on file  . Highest education level: Not on file  Occupational History  . Occupation: retired    Tobacco Use  . Smoking status: Former Research scientist (life sciences)  . Smokeless tobacco: Former Systems developer  . Tobacco comment: quit smoking about 6-7ytrs ago  Substance and Sexual Activity  . Alcohol use: No    Comment: drank over 40 years ago  . Drug use: No  . Sexual activity: Yes  Other Topics Concern  . Not on file  Social History Narrative  . Not on file   Social Determinants of Health   Financial Resource Strain: Low Risk   . Difficulty of Paying Living Expenses: Not hard at all  Food Insecurity: No Food Insecurity  . Worried About Charity fundraiser in the Last Year: Never true  . Ran Out of Food in the Last Year: Never true  Transportation Needs: No Transportation Needs  . Lack of Transportation (Medical): No  . Lack of Transportation (Non-Medical): No  Physical Activity: Inactive  . Days of Exercise per Week: 0 days  . Minutes of Exercise per Session: 0 min  Stress: No Stress Concern Present  . Feeling of Stress : Not at all  Social Connections:   . Frequency of Communication with Friends and Family: Not on file  . Frequency of Social Gatherings with Friends and Family: Not on file  . Attends Religious Services: Not on file  . Active Member of Clubs or Organizations: Not on file  . Attends Archivist Meetings: Not on file  . Marital Status: Not on file    Outpatient Encounter Medications as of 10/29/2019  Medication Sig  . amLODipine (NORVASC) 10 MG tablet Take 1 tablet (10 mg total) by mouth daily.  Marland Kitchen aspirin EC 81 MG tablet Take 81 mg by mouth daily.  . Cholecalciferol (VITAMIN D-3 PO) Take 2 tablets by mouth daily.   . quinapril (ACCUPRIL) 40 MG tablet TAKE 1 TABLET BY MOUTH  DAILY  . triamcinolone cream (KENALOG) 0.1 % APPLY TOPICALLY A THIN LAYER TO THE AFFECTED AREA(S) TWICE A DAY  . fluticasone (FLONASE) 50 MCG/ACT nasal spray Place 1 spray into both nostrils daily. (Patient not taking: Reported on 10/29/2019)  . HYDROcodone-acetaminophen (NORCO) 5-325 MG tablet Take 1-2  tablets by mouth every 6 (six) hours as needed for moderate pain. One to two tabs every 4-6 hours for pain (Patient not taking: Reported on 10/29/2019)   No facility-administered encounter medications on file as of 10/29/2019.    Activities of Daily Living In your present state of health, do you have any difficulty performing the following activities: 10/29/2019  Hearing? N  Vision? N  Difficulty concentrating or making decisions? N  Walking or climbing stairs? N  Dressing or bathing? N  Doing errands, shopping? N  Preparing Food and eating ? N  Using the Toilet? N  In the past six months, have you accidently leaked urine? N  Do you have problems with loss of bowel control? N  Managing your Medications? N  Managing your Finances? N  Housekeeping  or managing your Housekeeping? N  Some recent data might be hidden    Patient Care Team: [name], FNP as PCP - General (General Practice) Jerline Pain, MD as PCP - Cardiology (Cardiology) Rex Kras, Claudette Stapler, RN as [facility]   Assessment:   This is a routine wellness examination for Gwyndolyn Saxon.  Exercise Activities and Dietary recommendations Current Exercise Habits: The patient does not participate in regular exercise at present  Goals    . Patient Stated     10/29/2019, wants wrist to get better so he can get back to working in the yard       [place]  10/29/2019 01/22/2019 10/22/2018 09/27/2018 05/15/2017  Falls in the past year? 1 0 1 0 Yes  Comment tripped over grass - - - Emmi Telephone Survey: data to providers prior to load  Number falls in past yr: - - 0 - 1  Comment - - - - Emmi Telephone Survey Actual Response = 1  Injury with Fall? 1 - 0 - No  Comment broke wrist - - - -  Risk for fall due to : History of fall(s);Medication side effect - - - -  Follow up Falls evaluation completed;Education provided;Falls prevention discussed - - - -   Is the patient's home free of loose throw rugs in  walkways, pet beds, electrical cords, etc?   yes      Grab bars in the bathroom? no      Handrails on the stairs?   yes      Adequate lighting?   yes  Timed Get Up and Go Performed: n/a  Depression Screen PHQ 2/9 Scores 10/29/2019 01/22/2019 10/22/2018 09/27/2018  PHQ - 2 Score 0 0 0 0  PHQ- 9 Score 0 - - -    Cognitive Function MMSE - Mini Mental State Exam 10/22/2018  Orientation to time 5  Orientation to Place 5  Registration 3  Attention/ Calculation 5  Language- name 2 objects 2  Language- read & follow direction 1  Write a sentence 1  Copy design 1     6CIT Screen 10/29/2019  What Year? 0 points  What month? 0 points  What time? 0 points  Count back from 20 0 points  Months in reverse 2 points  Repeat phrase 0 points  Total Score 2    Immunization History  Administered Date(s) Administered  . Influenza, High Dose Seasonal PF 09/27/2018, 08/05/2019    Qualifies for Shingles Vaccine? yes  Screening Tests Health Maintenance  Topic Date Due  . PNA vac Low Risk Adult (2 of 2 - PPSV23) 10/28/2020 (Originally 04/24/2014)  . TETANUS/TDAP  04/25/2023  . COLONOSCOPY  08/27/2028  . INFLUENZA VACCINE  Completed  . Hepatitis C Screening  Completed   Cancer Screenings: Lung: Low Dose CT Chest recommended if Age 25-80 years, 30 pack-year currently smoking OR have quit w/in 15years. Patient does not qualify. Colorectal: up to date  Additional Screenings:  Hepatitis C Screening:08/2012      Plan:    Patient wants wrist to get better so he can get back to working out in the yard.  I have personally reviewed and noted the following in the patient's chart:   . Medical and social history . Use of alcohol, tobacco or illicit drugs  . Current medications and supplements . Functional ability and status . Nutritional status . Physical activity . Advanced directives . List of other physicians . Hospitalizations, surgeries, and ER  visits in previous 12  months . Vitals . Screenings to include cognitive, depression, and falls . Referrals and appointments  In addition, I have reviewed and discussed with patient certain preventive protocols, quality metrics, and best practice recommendations. A written personalized care plan for preventive services as well as general preventive health recommendations were provided to patient.     Kellie Simmering, LPN  [id number]

## 2019-10-29 NOTE — Patient Instructions (Signed)
Ryan Craig , Thank you for taking time to come for your Medicare Wellness Visit. I appreciate your ongoing commitment to your health goals. Please review the following plan we discussed and let me know if I can assist you in the future.   Screening recommendations/referrals: Colonoscopy: 08/2018 Recommended yearly ophthalmology/optometry visit for glaucoma screening and checkup Recommended yearly dental visit for hygiene and checkup  Vaccinations: Influenza vaccine: 07/2019 Pneumococcal vaccine: 04/2013 Tdap vaccine: 04/2013 Shingles vaccine: discussed    Advanced directives: Please bring a copy of your POA (Power of [place]) and/or Living Will to your next appointment.    Conditions/risks identified: obesity  Next appointment: 11/04/2019 at 9:00  Preventive Care 76 Years and Older, Male Preventive care refers to lifestyle choices and visits with your health care provider that can promote health and wellness. What does preventive care include?  A yearly physical exam. This is also called an annual well check.  Dental exams once or twice a year.  Routine eye exams. Ask your health care provider how often you should have your eyes checked.  Personal lifestyle choices, including:  Daily care of your teeth and gums.  Regular physical activity.  Eating a healthy diet.  Avoiding tobacco and drug use.  Limiting alcohol use.  Practicing safe sex.  Taking low doses of aspirin every day.  Taking vitamin and mineral supplements as recommended by your health care provider. What happens during an annual well check? The services and screenings done by your health care provider during your annual well check will depend on your age, overall health, lifestyle risk factors, and family history of disease. Counseling  Your health care provider may ask you questions about your:  Alcohol use.  Tobacco use.  Drug use.  Emotional well-being.  Home and relationship  well-being.  Sexual activity.  Eating habits.  History of falls.  Memory and ability to understand (cognition).  Work and work Statistician. Screening  You may have the following tests or measurements:  Height, weight, and BMI.  Blood pressure.  Lipid and cholesterol levels. These may be checked every 5 years, or more frequently if you are over 76 years old.  Skin check.  Lung cancer screening. You may have this screening every year starting at age 76 if you have a 30-pack-year history of smoking and currently smoke or have quit within the past 15 years.  Fecal occult blood test (FOBT) of the stool. You may have this test every year starting at age 76.  Flexible sigmoidoscopy or colonoscopy. You may have a sigmoidoscopy every 5 years or a colonoscopy every 10 years starting at age 76.  Prostate cancer screening. Recommendations will vary depending on your family history and other risks.  Hepatitis C blood test.  Hepatitis B blood test.  Sexually transmitted disease (STD) testing.  Diabetes screening. This is done by checking your blood sugar (glucose) after you have not eaten for a while (fasting). You may have this done every 1-3 years.  Abdominal aortic aneurysm (AAA) screening. You may need this if you are a current or former smoker.  Osteoporosis. You may be screened starting at age 76 if you are at high risk. Talk with your health care provider about your test results, treatment options, and if necessary, the need for more tests. Vaccines  Your health care provider may recommend certain vaccines, such as:  Influenza vaccine. This is recommended every year.  Tetanus, diphtheria, and acellular pertussis (Tdap, Td) vaccine. You may need a Td booster every  10 years.  Zoster vaccine. You may need this after age 76.  Pneumococcal 13-valent conjugate (PCV13) vaccine. One dose is recommended after age 76.  Pneumococcal polysaccharide (PPSV23) vaccine. One dose is  recommended after age 76. Talk to your health care provider about which screenings and vaccines you need and how often you need them. This information is not intended to replace advice given to you by your health care provider. Make sure you discuss any questions you have with your health care provider. Document Released: 11/05/2015 Document Revised: 06/28/2016 Document Reviewed: 08/10/2015 Elsevier Interactive Patient Education  2017 [place] Prevention in the Home Falls can cause injuries. They can happen to people of all ages. There are many things you can do to make your home safe and to help prevent falls. What can I do on the outside of my home?  Regularly fix the edges of walkways and driveways and fix any cracks.  Remove anything that might make you trip as you walk through a door, such as a raised step or threshold.  Trim any bushes or trees on the path to your home.  Use bright outdoor lighting.  Clear any walking paths of anything that might make someone trip, such as rocks or tools.  Regularly check to see if handrails are loose or broken. Make sure that both sides of any steps have handrails.  Any raised decks and porches should have guardrails on the edges.  Have any leaves, snow, or ice cleared regularly.  Use sand or salt on walking paths during winter.  Clean up any spills in your garage right away. This includes oil or grease spills. What can I do in the bathroom?  Use night lights.  Install grab bars by the toilet and in the tub and shower. Do not use towel bars as grab bars.  Use non-skid mats or decals in the tub or shower.  If you need to sit down in the shower, use a plastic, non-slip stool.  Keep the floor dry. Clean up any water that spills on the floor as soon as it happens.  Remove soap buildup in the tub or shower regularly.  Attach bath mats securely with double-sided non-slip rug tape.  Do not have throw rugs and other things on  the floor that can make you trip. What can I do in the bedroom?  Use night lights.  Make sure that you have a light by your bed that is easy to reach.  Do not use any sheets or blankets that are too big for your bed. They should not hang down onto the floor.  Have a firm chair that has side arms. You can use this for support while you get dressed.  Do not have throw rugs and other things on the floor that can make you trip. What can I do in the kitchen?  Clean up any spills right away.  Avoid walking on wet floors.  Keep items that you use a lot in easy-to-reach places.  If you need to reach something above you, use a strong step stool that has a grab bar.  Keep electrical cords out of the way.  Do not use floor polish or wax that makes floors slippery. If you must use wax, use non-skid floor wax.  Do not have throw rugs and other things on the floor that can make you trip. What can I do with my stairs?  Do not leave any items on the stairs.  Make sure  that there are handrails on both sides of the stairs and use them. Fix handrails that are broken or loose. Make sure that handrails are as long as the stairways.  Check any carpeting to make sure that it is firmly attached to the stairs. Fix any carpet that is loose or worn.  Avoid having throw rugs at the top or bottom of the stairs. If you do have throw rugs, attach them to the floor with carpet tape.  Make sure that you have a light switch at the top of the stairs and the bottom of the stairs. If you do not have them, ask someone to add them for you. What else can I do to help prevent falls?  Wear shoes that:  Do not have high heels.  Have rubber bottoms.  Are comfortable and fit you well.  Are closed at the toe. Do not wear sandals.  If you use a stepladder:  Make sure that it is fully opened. Do not climb a closed stepladder.  Make sure that both sides of the stepladder are locked into place.  Ask someone to  hold it for you, if possible.  Clearly mark and make sure that you can see:  Any grab bars or handrails.  First and last steps.  Where the edge of each step is.  Use tools that help you move around (mobility aids) if they are needed. These include:  Canes.  Walkers.  Scooters.  Crutches.  Turn on the lights when you go into a dark area. Replace any light bulbs as soon as they burn out.  Set up your furniture so you have a clear path. Avoid moving your furniture around.  If any of your floors are uneven, fix them.  If there are any pets around you, be aware of where they are.  Review your medicines with your doctor. Some medicines can make you feel dizzy. This can increase your chance of falling. Ask your doctor what other things that you can do to help prevent falls. This information is not intended to replace advice given to you by your health care provider. Make sure you discuss any questions you have with your health care provider. Document Released: 08/05/2009 Document Revised: 03/16/2016 Document Reviewed: 11/13/2014 Elsevier Interactive Patient Education  2017 [employer].

## 2019-11-04 ENCOUNTER — Ambulatory Visit (INDEPENDENT_AMBULATORY_CARE_PROVIDER_SITE_OTHER): Payer: Medicare Other | Admitting: Nurse Practitioner

## 2019-11-04 ENCOUNTER — Encounter: Payer: Self-pay | Admitting: Nurse Practitioner

## 2019-11-04 ENCOUNTER — Other Ambulatory Visit: Payer: Self-pay

## 2019-11-04 VITALS — BP 140/76 | HR 75 | Temp 98.8°F | Ht 66.0 in | Wt 210.0 lb

## 2019-11-04 DIAGNOSIS — I452 Bifascicular block: Secondary | ICD-10-CM | POA: Diagnosis not present

## 2019-11-04 DIAGNOSIS — I10 Essential (primary) hypertension: Secondary | ICD-10-CM

## 2019-11-04 DIAGNOSIS — R7309 Other abnormal glucose: Secondary | ICD-10-CM

## 2019-11-04 DIAGNOSIS — Z125 Encounter for screening for malignant neoplasm of prostate: Secondary | ICD-10-CM

## 2019-11-04 DIAGNOSIS — E7800 Pure hypercholesterolemia, unspecified: Secondary | ICD-10-CM

## 2019-11-04 DIAGNOSIS — R351 Nocturia: Secondary | ICD-10-CM | POA: Diagnosis not present

## 2019-11-04 DIAGNOSIS — Z0000 Encounter for general adult medical examination without abnormal findings: Secondary | ICD-10-CM

## 2019-11-04 DIAGNOSIS — Z1211 Encounter for screening for malignant neoplasm of colon: Secondary | ICD-10-CM | POA: Diagnosis not present

## 2019-11-04 DIAGNOSIS — Z6837 Body mass index (BMI) 37.0-37.9, adult: Secondary | ICD-10-CM

## 2019-11-04 LAB — POCT URINALYSIS DIPSTICK
Bilirubin, UA: NEGATIVE
Blood, UA: NEGATIVE
Glucose, UA: NEGATIVE
Ketones, UA: NEGATIVE
Leukocytes, UA: NEGATIVE
Nitrite, UA: NEGATIVE
Protein, UA: NEGATIVE
Spec Grav, UA: 1.025 (ref 1.010–1.025)
Urobilinogen, UA: 0.2 E.U./dL
pH, UA: 5.5 (ref 5.0–8.0)

## 2019-11-04 LAB — POCT UA - MICROALBUMIN
Albumin/Creatinine Ratio, Urine, POC: 30
Creatinine, POC: 300 mg/dL
Microalbumin Ur, POC: 10 mg/L

## 2019-11-04 NOTE — Patient Instructions (Signed)
Health Maintenance After Age 76 After age 76, you are at a higher risk for certain long-term diseases and infections as well as injuries from falls. Falls are a major cause of broken bones and head injuries in people who are older than age 76. Getting regular preventive care can help to keep you healthy and well. Preventive care includes getting regular testing and making lifestyle changes as recommended by your health care provider. Talk with your health care provider about:  Which screenings and tests you should have. A screening is a test that checks for a disease when you have no symptoms.  A diet and exercise plan that is right for you. What should I know about screenings and tests to prevent falls? Screening and testing are the best ways to find a health problem early. Early diagnosis and treatment give you the best chance of managing medical conditions that are common after age 76. Certain conditions and lifestyle choices may make you more likely to have a fall. Your health care provider may recommend:  Regular vision checks. Poor vision and conditions such as cataracts can make you more likely to have a fall. If you wear glasses, make sure to get your prescription updated if your vision changes.  Medicine review. Work with your health care provider to regularly review all of the medicines you are taking, including over-the-counter medicines. Ask your health care provider about any side effects that may make you more likely to have a fall. Tell your health care provider if any medicines that you take make you feel dizzy or sleepy.  Osteoporosis screening. Osteoporosis is a condition that causes the bones to get weaker. This can make the bones weak and cause them to break more easily.  Blood pressure screening. Blood pressure changes and medicines to control blood pressure can make you feel dizzy.  Strength and balance checks. Your health care provider may recommend certain tests to check your  strength and balance while standing, walking, or changing positions.  Foot health exam. Foot pain and numbness, as well as not wearing proper footwear, can make you more likely to have a fall.  Depression screening. You may be more likely to have a fall if you have a fear of falling, feel emotionally low, or feel unable to do activities that you used to do.  Alcohol use screening. Using too much alcohol can affect your balance and may make you more likely to have a fall. What actions can I take to lower my risk of falls? General instructions  Talk with your health care provider about your risks for falling. Tell your health care provider if: ? You fall. Be sure to tell your health care provider about all falls, even ones that seem minor. ? You feel dizzy, sleepy, or off-balance.  Take over-the-counter and prescription medicines only as told by your health care provider. These include any supplements.  Eat a healthy diet and maintain a healthy weight. A healthy diet includes low-fat dairy products, low-fat (lean) meats, and fiber from whole grains, beans, and lots of fruits and vegetables. Home safety  Remove any tripping hazards, such as rugs, cords, and clutter.  Install safety equipment such as grab bars in bathrooms and safety rails on stairs.  Keep rooms and walkways well-lit. Activity   Follow a regular exercise program to stay fit. This will help you maintain your balance. Ask your health care provider what types of exercise are appropriate for you.  If you need a cane or   walker, use it as recommended by your health care provider.  Wear supportive shoes that have nonskid soles. Lifestyle  Do not drink alcohol if your health care provider tells you not to drink.  If you drink alcohol, limit how much you have: ? 0-1 drink a day for women. ? 0-2 drinks a day for men.  Be aware of how much alcohol is in your drink. In the [place] one drink equals one typical bottle of beer (12  oz), one-half glass of wine (5 oz), or one shot of hard liquor (1 oz).  Do not use any products that contain nicotine or tobacco, such as cigarettes and e-cigarettes. If you need help quitting, ask your health care provider. Summary  Having a healthy lifestyle and getting preventive care can help to protect your health and wellness after age 76.  Screening and testing are the best way to find a health problem early and help you avoid having a fall. Early diagnosis and treatment give you the best chance for managing medical conditions that are more common for people who are older than age 76.  Falls are a major cause of broken bones and head injuries in people who are older than age 76. Take precautions to prevent a fall at home.  Work with your health care provider to learn what changes you can make to improve your health and wellness and to prevent falls. This information is not intended to replace advice given to you by your health care provider. Make sure you discuss any questions you have with your health care provider. Document Revised: 01/30/2019 Document Reviewed: 08/22/2017 Elsevier Patient Education  2020 Elsevier Inc.  

## 2019-11-04 NOTE — Progress Notes (Signed)
This visit occurred during the SARS-CoV-2 public health emergency.  Safety protocols were in place, including screening questions prior to the visit, additional usage of staff PPE, and extensive cleaning of exam room while observing appropriate contact time as indicated for disinfecting solutions.  Subjective:     Patient ID: Ryan Craig , male    DOB: 1946/10/23 , 74 y.o.   MRN: [id number]   Chief Complaint  Patient presents with  . Annual Exam    HPI  Here for HM   Fractured his wrist on left side after falling in the grass at [place].  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  He is taking Tylenol arthritis.  He is having pain at the padding of his hands.  He is also taking aleve at times.     Wt Readings from Last 3 Encounters: 11/04/19 : 210 lb (95.3 kg) 10/29/19 : 210 lb (95.3 kg) 08/05/19 : 225 lb (102.1 kg)    Men's preventive visit. Patient Health Questionnaire (PHQ-2) is    Clinical Support from 10/29/2019 in [facility]  PHQ-2 Total Score  0     Patient is on a regular diet, decreased appetite, he will drink water a lot. Not exercising as much since not cutting grass. Marital status: Married. Relevant history for alcohol use is:  Social History   Substance and Sexual Activity  Alcohol Use No   Comment: drank over 40 years ago   Relevant history for tobacco use is:  Social History   Tobacco Use  Smoking Status Former Smoker  Smokeless Tobacco Former Systems developer  Tobacco Comment   quit smoking about 6-7ytrs ago    Past Medical History:  Diagnosis Date  . Anemia    no on any meds  . Arthritis   . Chronic back pain    deteriorating  . Hyperlipidemia    was on Lipitor but was taken off 3-28yr ago by medical md bc running liver enzymes up  . Hypertension    takes Amlodipine and Quinapril daily  . Joint pain   . Joint swelling      Family History  Problem Relation Age of Onset  . Heart attack Mother   . Cancer Maternal  Aunt   . Cancer Paternal Aunt   . Cancer Cousin      Current Outpatient Medications:  .  amLODipine (NORVASC) 10 MG tablet, Take 1 tablet (10 mg total) by mouth daily., Disp: 90 tablet, Rfl: 0 .  aspirin EC 81 MG tablet, Take 81 mg by mouth daily., Disp: , Rfl:  .  Cholecalciferol (VITAMIN D-3 PO), Take 2 tablets by mouth daily. , Disp: , Rfl:  .  quinapril (ACCUPRIL) 40 MG tablet, TAKE 1 TABLET BY MOUTH  DAILY, Disp: 90 tablet, Rfl: 3 .  triamcinolone cream (KENALOG) 0.1 %, APPLY TOPICALLY A THIN LAYER TO THE AFFECTED AREA(S) TWICE A DAY, Disp: 454 g, Rfl: 0 .  fluticasone (FLONASE) 50 MCG/ACT nasal spray, Place 1 spray into both nostrils daily. (Patient not taking: Reported on 10/29/2019), Disp: 16 g, Rfl: 2 .  HYDROcodone-acetaminophen (NORCO) 5-325 MG tablet, Take 1-2 tablets by mouth every 6 (six) hours as needed for moderate pain. One to two tabs every 4-6 hours for pain (Patient not taking: Reported on 10/29/2019), Disp: 40 tablet, Rfl: 0   Allergies  Allergen Reactions  . Contrast Media [Iodinated Diagnostic Agents] Anaphylaxis  . Latex Rash     Review of Systems  Constitutional: Negative.  HENT: Negative.   Eyes: Negative.   Respiratory: Negative.   Cardiovascular: Negative.   Gastrointestinal: Negative.   Endocrine: Negative.   Genitourinary: Negative.   Musculoskeletal: Positive for arthralgias (bilateral hands and left shoulder, left wrist).  Skin: Negative.   Neurological: Negative.  Negative for dizziness and headaches.  Hematological: Negative.   Psychiatric/Behavioral: Negative.      Today's Vitals   11/04/19 0858  BP: 140/76  Pulse: 75  Temp: 98.8 F (37.1 C)  TempSrc: Oral  Weight: 210 lb (95.3 kg)  Height: 5' 6" (1.676 m)  PainSc: 0-No pain   Body mass index is 33.89 kg/m.   Objective:  Physical Exam Vitals reviewed.  Constitutional:      Appearance: Normal appearance. He is obese.  HENT:     Head: Normocephalic and atraumatic.     Right Ear:  Tympanic membrane, ear canal and external ear normal. There is no impacted cerumen.     Left Ear: Tympanic membrane, ear canal and external ear normal. There is no impacted cerumen.  Cardiovascular:     Rate and Rhythm: Normal rate and regular rhythm.     Pulses: Normal pulses.     Heart sounds: Normal heart sounds. No murmur.  Pulmonary:     Effort: Pulmonary effort is normal. No respiratory distress.     Breath sounds: Normal breath sounds.  Abdominal:     General: Abdomen is flat. Bowel sounds are normal. There is no distension.     Palpations: Abdomen is soft.  Genitourinary:    Prostate: Normal.     Rectum: Guaiac result negative.  Musculoskeletal:        General: Normal range of motion.     Cervical back: Normal range of motion and neck supple.  Skin:    General: Skin is warm.     Capillary Refill: Capillary refill takes less than 2 seconds.  Neurological:     General: No focal deficit present.     Mental Status: He is alert and oriented to person, place, and time.  Psychiatric:        Mood and Affect: Mood normal.        Behavior: Behavior normal.        Thought Content: Thought content normal.        Judgment: Judgment normal.         Assessment And Plan:     1. Health maintenance examination . Behavior modifications discussed and diet history reviewed.   . Pt will continue to exercise regularly and modify diet with low GI, plant based foods and decrease intake of processed foods.  . Recommend intake of daily multivitamin, Vitamin D, and calcium.  . Recommend mammogram and colonoscopy for preventive screenings, as well as recommend immunizations that include influenza, TDAP  2. Essential hypertension  EKG done with right bundle branch block with left axis bifascicular block  Continue follow up with cardiology this week - POCT Urinalysis Dipstick (81002) - POCT UA - Microalbumin - EKG 12-Lead - CMP14+EGFR  3. Other abnormal glucose  Chronic, stable  Will  check HgbA1c - CMP14+EGFR - Hemoglobin A1c  4. Pure hypercholesterolemia  Chronic,  Will check lipid panel, currently not on any medications - CMP14+EGFR - Lipid Profile  5. Encounter for prostate cancer screening   6. Nocturia  Will check PSA since episodes of nocturia - PSA   [name], FNP    THE PATIENT IS ENCOURAGED TO PRACTICE SOCIAL DISTANCING DUE TO THE COVID-19 PANDEMIC.

## 2019-11-05 LAB — CMP14+EGFR
ALT: 13 IU/L (ref 0–44)
AST: 19 IU/L (ref 0–40)
Albumin/Globulin Ratio: 1.6 (ref 1.2–2.2)
Albumin: 4.4 g/dL (ref 3.7–4.7)
Alkaline Phosphatase: 75 IU/L (ref 39–117)
BUN/Creatinine Ratio: 18 (ref 10–24)
BUN: 14 mg/dL (ref 8–27)
Bilirubin Total: 0.6 mg/dL (ref 0.0–1.2)
CO2: 22 mmol/L (ref 20–29)
Calcium: 9.9 mg/dL (ref 8.6–10.2)
Chloride: 104 mmol/L (ref 96–106)
Creatinine, Ser: 0.76 mg/dL (ref 0.76–1.27)
GFR calc Af Amer: 103 mL/min/{1.73_m2} (ref >59)
GFR calc non Af Amer: 89 mL/min/{1.73_m2} (ref >59)
Globulin, Total: 2.7 g/dL (ref 1.5–4.5)
Glucose: 109 mg/dL — ABNORMAL HIGH (ref 65–99)
Potassium: 4.6 mmol/L (ref 3.5–5.2)
Sodium: 138 mmol/L (ref 134–144)
Total Protein: 7.1 g/dL (ref 6.0–8.5)

## 2019-11-05 LAB — LIPID PANEL
Chol/HDL Ratio: 3.7 ratio (ref 0.0–5.0)
Cholesterol, Total: 191 mg/dL (ref 100–199)
HDL: 52 mg/dL (ref >39)
LDL Chol Calc (NIH): 114 mg/dL — ABNORMAL HIGH (ref 0–99)
Triglycerides: 139 mg/dL (ref 0–149)
VLDL Cholesterol Cal: 25 mg/dL (ref 5–40)

## 2019-11-05 LAB — HEMOGLOBIN A1C
Est. average glucose Bld gHb Est-mCnc: 111 mg/dL
Hgb A1c MFr Bld: 5.5 % (ref 4.8–5.6)

## 2019-11-05 LAB — PSA: Prostate Specific Ag, Serum: 0.1 ng/mL (ref 0.0–4.0)

## 2019-11-06 ENCOUNTER — Ambulatory Visit (INDEPENDENT_AMBULATORY_CARE_PROVIDER_SITE_OTHER): Payer: Medicare Other | Admitting: Orthopaedic Surgery

## 2019-11-06 ENCOUNTER — Encounter: Payer: Self-pay | Admitting: Orthopaedic Surgery

## 2019-11-06 ENCOUNTER — Ambulatory Visit (INDEPENDENT_AMBULATORY_CARE_PROVIDER_SITE_OTHER): Payer: Medicare Other

## 2019-11-06 ENCOUNTER — Other Ambulatory Visit: Payer: Self-pay

## 2019-11-06 DIAGNOSIS — S62102D Fracture of unspecified carpal bone, left wrist, subsequent encounter for fracture with routine healing: Secondary | ICD-10-CM

## 2019-11-06 MED ORDER — HYDROCODONE-ACETAMINOPHEN 5-325 MG PO TABS: 1.0000 | ORAL_TABLET | Freq: Four times a day (QID) | ORAL | 0 refills | Status: DC | PRN

## 2019-11-06 NOTE — Progress Notes (Signed)
The patient is a 74 year old right-hand-dominant gentleman well-known to me.  He fractured his left wrist after mechanical fall 3 months ago.  He elected to treat this nonoperatively.  He was placed in a splint and [place]  He then followed up with [place].  He is now in a Velcro wrist splint.  He does report ulnar-sided wrist pain and soreness.  He likes wearing his wrist splint because he does feel like that is giving him some support.  On examination of his left wrist there is ulnar positive variance that I can even feel and palpate.  His range of motion is actually really good of his wrist with full pronation supination and really good dorsiflexion and plantarflexion.  His swelling is minimal.  2 views of the left distal radius show that the fracture is healed of the distal radius and ulnar styloid but there is ulnar positive variance, osteopenic bone and radial shortening.  This point he can come in and out of the splint as comfort allows.  I will provide normal prescription for pain medications.  If he has chronic pain as it relates to ulnar-sided wrist pain with positive ulnar-sided variance, we can always perform an osteotomy and realignment with plating.  He understands this as well but right now he said he is doing well enough to continue to treat this conservatively nonoperative.  All question concerns were answered and addressed.  Follow-up can be as needed.

## 2019-11-12 DIAGNOSIS — Z6837 Body mass index (BMI) 37.0-37.9, adult: Secondary | ICD-10-CM | POA: Insufficient documentation

## 2019-11-24 ENCOUNTER — Encounter: Payer: Self-pay | Admitting: Cardiology

## 2019-11-24 ENCOUNTER — Ambulatory Visit (INDEPENDENT_AMBULATORY_CARE_PROVIDER_SITE_OTHER): Payer: Medicare Other | Admitting: Cardiology

## 2019-11-24 ENCOUNTER — Other Ambulatory Visit: Payer: Self-pay

## 2019-11-24 VITALS — BP 136/80 | HR 73 | Ht 66.0 in | Wt 214.0 lb

## 2019-11-24 DIAGNOSIS — Z87891 Personal history of nicotine dependence: Secondary | ICD-10-CM

## 2019-11-24 DIAGNOSIS — I452 Bifascicular block: Secondary | ICD-10-CM

## 2019-11-24 DIAGNOSIS — I444 Left anterior fascicular block: Secondary | ICD-10-CM | POA: Diagnosis not present

## 2019-11-24 DIAGNOSIS — I4510 Unspecified right bundle-branch block: Secondary | ICD-10-CM | POA: Diagnosis not present

## 2019-11-24 DIAGNOSIS — E669 Obesity, unspecified: Secondary | ICD-10-CM

## 2019-11-24 NOTE — Progress Notes (Signed)
Cardiology Office Note:    Date:  11/24/2019   ID:  Ryan Craig, DOB Feb 23, 1946, MRN [id number]  PCP:  [name], FNP  Cardiologist:  Candee Furbish, MD  Electrophysiologist:  None   Referring MD: [name], FNP     History of Present Illness:    Ryan Craig is a 74 y.o. male here for evaluation of abnormal EKG, newly discovered right bundle branch block and left anterior fascicular block.     He has had many prior visits to [facility] secondary to left knee pain.  His mother had heart attack in her 78s.  Hence family history of CAD. He quit smoking about 10 years ago.  Occasionally will feel fluttering-like sensation but nothing prolonged.  Overall he is not having any high risk symptoms such as syncope angina, significant shortness of breath.  He does walk [place] and sometimes does have to sit down at the top of the hill to rest because of shortness of breath but certainly this is not severe to him.  He is having no chest discomfort with these sensations.  11/24/19 -here for the follow-up of bifascicular block.  Overall been doing quite well.  No syncope, no significant shortness of breath.  [place] broke wrist. Dr. Ninfa Linden operated, rare sharp CP musculoskeletal.    Past Medical History:  Diagnosis Date  . Anemia    no on any meds  . Arthritis   . Chronic back pain    deteriorating  . Hyperlipidemia    was on Lipitor but was taken off 3-65yrs ago by medical md bc running liver enzymes up  . Hypertension    takes Amlodipine and Quinapril daily  . Joint pain   . Joint swelling     Past Surgical History:  Procedure Laterality Date  . arthroscopic knee surgery Right   . COLONOSCOPY    . INGUINAL HERNIA REPAIR Bilateral    x 4  . JOINT REPLACEMENT Right 6 yrs ago   knee  . KNEE ARTHROSCOPY Left 02/24/2014   Procedure: LEFT KNEE ARTHROSCOPY WITH PARTIAL MEDIAL MENISCECTOMY;  Surgeon: Mcarthur Rossetti, MD;  Location: [facility];  Service:  Orthopedics;  Laterality: Left;  . TOTAL KNEE ARTHROPLASTY Left 12/22/2014   Procedure: LEFT TOTAL KNEE ARTHROPLASTY;  Surgeon: Mcarthur Rossetti, MD;  Location: [facility];  Service: Orthopedics;  Laterality: Left;    Current Medications: Current Meds  Medication Sig  . amLODipine (NORVASC) 10 MG tablet Take 1 tablet (10 mg total) by mouth daily.  Marland Kitchen aspirin EC 81 MG tablet Take 81 mg by mouth daily.  . Cholecalciferol (VITAMIN D-3 PO) Take 2 tablets by mouth daily.   Marland Kitchen HYDROcodone-acetaminophen (NORCO) 5-325 MG tablet Take 1-2 tablets by mouth every 6 (six) hours as needed for moderate pain. One to two tabs every 4-6 hours for pain  . quinapril (ACCUPRIL) 40 MG tablet TAKE 1 TABLET BY MOUTH  DAILY  . triamcinolone cream (KENALOG) 0.1 % APPLY TOPICALLY A THIN LAYER TO THE AFFECTED AREA(S) TWICE A DAY     Allergies:   Contrast media [iodinated diagnostic agents] and Latex   Social History   Socioeconomic History  . Marital status: Married    Spouse name: Not on file  . Number of children: Not on file  . Years of education: Not on file  . Highest education level: Not on file  Occupational History  . Occupation: retired  Tobacco Use  . Smoking status: Former Research scientist (life sciences)  . Smokeless tobacco: Former  User  . Tobacco comment: quit smoking about 6-7ytrs ago  Substance and Sexual Activity  . Alcohol use: No    Comment: drank over 40 years ago  . Drug use: No  . Sexual activity: Yes  Other Topics Concern  . Not on file  Social History Narrative  . Not on file   Social Determinants of Health   Financial Resource Strain: Low Risk   . Difficulty of Paying Living Expenses: Not hard at all  Food Insecurity: No Food Insecurity  . Worried About Charity fundraiser in the Last Year: Never true  . Ran Out of Food in the Last Year: Never true  Transportation Needs: No Transportation Needs  . Lack of Transportation (Medical): No  . Lack of Transportation (Non-Medical): No  Physical Activity:  Inactive  . Days of Exercise per Week: 0 days  . Minutes of Exercise per Session: 0 min  Stress: No Stress Concern Present  . Feeling of Stress : Not at all  Social Connections:   . Frequency of Communication with Friends and Family: Not on file  . Frequency of Social Gatherings with Friends and Family: Not on file  . Attends Religious Services: Not on file  . Active Member of Clubs or Organizations: Not on file  . Attends Archivist Meetings: Not on file  . Marital Status: Not on file     Family History: The patient's family history includes Cancer in his cousin, maternal aunt, and paternal aunt; Heart attack in his mother.  ROS:   Please see the history of present illness.    Denies any fevers chills nausea vomiting syncope bleeding all other systems reviewed and are negative.  EKGs/Labs/Other Studies Reviewed:    The following studies were reviewed today: Prior office notes reviewed, EKG personally reviewed and interpreted, lab work reviewed  Echocardiogram 11/29/2018: Normal LV systolic function; mild LVH; mild diastolic dysfunction;  mildly dilated aortic root; sclerotic aortic valve with mild AI; mild TR;  mild pulmonary hypertension.   EKG: EKG today shows heart rate 73 bpm right bundle branch block left anterior fascicular block.  Prior EKG described as above right bundle branch block sinus rhythm left anterior fascicular block.  Recent Labs: 08/05/2019: TSH 1.010 11/04/2019: ALT 13; BUN 14; Creatinine, Ser 0.76; Potassium 4.6; Sodium 138  Recent Lipid Panel    Component Value Date/Time   CHOL 191 11/04/2019 0938   TRIG 139 11/04/2019 0938   HDL 52 11/04/2019 0938   CHOLHDL 3.7 11/04/2019 0938   LDLCALC 114 (H) 11/04/2019 0938    Physical Exam:    VS:  BP 136/80   Pulse 73   Ht 5\' 6"  (1.676 m)   Wt 214 lb (97.1 kg)   BMI 34.54 kg/m     Wt Readings from Last 3 Encounters:  11/24/19 214 lb (97.1 kg)  11/04/19 210 lb (95.3 kg)  10/29/19 210 lb  (95.3 kg)     GEN: Well nourished, well developed, in no acute distress  HEENT: normal  Neck: no JVD, carotid bruits, or masses Cardiac: RRR; no murmurs, rubs, or gallops,no edema  Respiratory:  clear to auscultation bilaterally, normal work of breathing GI: soft, nontender, nondistended, + BS MS: no deformity or atrophy  Skin: warm and dry, no rash Neuro:  Alert and Oriented x 3, Strength and sensation are intact Psych: euthymic mood, full affect   ASSESSMENT:    1. Right bundle branch block   2. Left anterior fascicular block  3. Bifascicular block   4. Former smoker   5. Obesity (BMI 30-39.9)    PLAN:    In order of problems listed above:  Abnormal EKG with right bundle branch block, left anterior fascicular block, bifascicular block -No symptoms of syncope.  EKG stable from last year.  He did suffer a fall but this was a mechanical fall, broke his wrist.  Dr. Ninfa Linden operated.  Continue to avoid AV nodal blocking agents.  Normal structure and function on echo.  Essential hypertension -Amlodipine and Accupril controlling.  Continue to monitor for any side effects.  Last creatinine from primary provider shows creatinine of 0.76.  Obesity -Continue to work on weight loss.  This will help his knees as well as his heart.  Former smoker - Likely contributing to his dyspnea.  Conditioning also playing a role. Quit 12 years ago  Hyperlipidemia -LDL 114 triglycerides 139, ALT normal at 13.  Continue to try to avoid fatty foods.  Weight loss.  Given his history, would like to see him back in 1 year.  Of course if his symptoms were to change, i.e. bradycardia, worsening shortness of breath, syncope he will let us know.  1 year follow-up.  Medication Adjustments/Labs and Tests Ordered: Current medicines are reviewed at length with the patient today.  Concerns regarding medicines are outlined above.  Orders Placed This Encounter  Procedures  . EKG 12-Lead   No orders of  the defined types were placed in this encounter.   Patient Instructions  Medication Instructions:  Your physician recommends that you continue on your current medications as directed. Please refer to the Current Medication list given to you today.  *If you need a refill on your cardiac medications before your next appointment, please call your pharmacy*  Lab Work: None ordered   If you have labs (blood work) drawn today and your tests are completely normal, you will receive your results only by: Marland Kitchen MyChart Message (if you have MyChart) OR . A paper copy in the mail If you have any lab test that is abnormal or we need to change your treatment, we will call you to review the results.  Testing/Procedures: None ordered   Follow-Up: At [facility], you and your health needs are our priority.  As part of our continuing mission to provide you with exceptional heart care, we have created designated Provider Care Teams.  These Care Teams include your primary Cardiologist (physician) and Advanced Practice Providers (APPs -  Physician Assistants and Nurse Practitioners) who all work together to provide you with the care you need, when you need it.  Your next appointment:   1 year(s)  The format for your next appointment:   In Person  Provider:   You may see Candee Furbish, MD or one of the following Advanced Practice Providers on your designated Care Team:    Truitt Merle, NP  Cecilie Kicks, NP  Kathyrn Drown, NP   Other Instructions None      Signed, Candee Furbish, MD  11/24/2019 9:00 AM    [place]

## 2019-11-24 NOTE — Patient Instructions (Signed)
Medication Instructions:  Your physician recommends that you continue on your current medications as directed. Please refer to the Current Medication list given to you today.  *If you need a refill on your cardiac medications before your next appointment, please call your pharmacy*  Lab Work: None ordered   If you have labs (blood work) drawn today and your tests are completely normal, you will receive your results only by: Marland Kitchen MyChart Message (if you have MyChart) OR . A paper copy in the mail If you have any lab test that is abnormal or we need to change your treatment, we will call you to review the results.  Testing/Procedures: None ordered   Follow-Up: At [facility], you and your health needs are our priority.  As part of our continuing mission to provide you with exceptional heart care, we have created designated Provider Care Teams.  These Care Teams include your primary Cardiologist (physician) and Advanced Practice Providers (APPs -  Physician Assistants and Nurse Practitioners) who all work together to provide you with the care you need, when you need it.  Your next appointment:   1 year(s)  The format for your next appointment:   In Person  Provider:   You may see Candee Furbish, MD or one of the following Advanced Practice Providers on your designated Care Team:    Truitt Merle, NP  Cecilie Kicks, NP  Kathyrn Drown, NP   Other Instructions None

## 2019-11-26 ENCOUNTER — Other Ambulatory Visit: Payer: Self-pay

## 2019-11-26 MED ORDER — AMLODIPINE BESYLATE 10 MG PO TABS: 10.0000 mg | ORAL_TABLET | Freq: Every day | ORAL | 0 refills | Status: DC

## 2019-12-17 DIAGNOSIS — Z23 Encounter for immunization: Secondary | ICD-10-CM | POA: Diagnosis not present

## 2019-12-26 ENCOUNTER — Other Ambulatory Visit: Payer: Self-pay | Admitting: Nurse Practitioner

## 2020-01-14 DIAGNOSIS — Z23 Encounter for immunization: Secondary | ICD-10-CM | POA: Diagnosis not present

## 2020-02-25 ENCOUNTER — Telehealth: Payer: Self-pay | Admitting: Nurse Practitioner

## 2020-02-25 NOTE — Chronic Care Management (AMB) (Signed)
  Chronic Care Management   Note  02/25/2020 Name: Burnette Sautter MRN: [id number] DOB: November 11, 1945  Kelechi Orgeron is a 74 y.o. year old male who is a primary care patient of [name], [place]. I reached out to [name] by phone today in response to a referral sent by Mr. Alexio Sroka health plan.     Mr. Sporn was given information about Chronic Care Management services today including:  1. CCM service includes personalized support from designated clinical staff supervised by his physician, including individualized plan of care and coordination with other care providers 2. 24/7 contact phone numbers for assistance for urgent and routine care needs. 3. Service will only be billed when office clinical staff spend 20 minutes or more in a month to coordinate care. 4. Only one practitioner may furnish and bill the service in a calendar month. 5. The patient may stop CCM services at any time (effective at the end of the month) by phone call to the office staff. 6. The patient will be responsible for cost sharing (co-pay) of up to 20% of the service fee (after annual deductible is met).  Patient did not agree to enrollment in care management services and does not wish to consider at this time.  Follow up plan: The patient has been provided with contact information for the care management team and has been advised to call with any health related questions or concerns.   [place], [place] [address] Direct Dial: [phone] [email or web address] Website: [email or web address]

## 2020-03-24 ENCOUNTER — Other Ambulatory Visit: Payer: Self-pay | Admitting: Nurse Practitioner

## 2020-05-04 ENCOUNTER — Other Ambulatory Visit: Payer: Self-pay

## 2020-05-04 ENCOUNTER — Encounter: Payer: Self-pay | Admitting: Nurse Practitioner

## 2020-05-04 ENCOUNTER — Ambulatory Visit (INDEPENDENT_AMBULATORY_CARE_PROVIDER_SITE_OTHER): Payer: Medicare Other | Admitting: Nurse Practitioner

## 2020-05-04 VITALS — BP 138/80 | HR 68 | Temp 97.4°F | Ht 65.2 in | Wt 213.6 lb

## 2020-05-04 DIAGNOSIS — Z7982 Long term (current) use of aspirin: Secondary | ICD-10-CM | POA: Diagnosis not present

## 2020-05-04 DIAGNOSIS — M2550 Pain in unspecified joint: Secondary | ICD-10-CM | POA: Diagnosis not present

## 2020-05-04 DIAGNOSIS — E7800 Pure hypercholesterolemia, unspecified: Secondary | ICD-10-CM | POA: Diagnosis not present

## 2020-05-04 DIAGNOSIS — I10 Essential (primary) hypertension: Secondary | ICD-10-CM

## 2020-05-04 DIAGNOSIS — R7309 Other abnormal glucose: Secondary | ICD-10-CM

## 2020-05-04 LAB — CMP14+EGFR
ALT: 12 IU/L (ref 0–44)
AST: 13 IU/L (ref 0–40)
Albumin/Globulin Ratio: 2 (ref 1.2–2.2)
Albumin: 4.3 g/dL (ref 3.7–4.7)
Alkaline Phosphatase: 64 IU/L (ref 48–121)
BUN/Creatinine Ratio: 17 (ref 10–24)
BUN: 16 mg/dL (ref 8–27)
Bilirubin Total: 0.7 mg/dL (ref 0.0–1.2)
CO2: 22 mmol/L (ref 20–29)
Calcium: 9.8 mg/dL (ref 8.6–10.2)
Chloride: 104 mmol/L (ref 96–106)
Creatinine, Ser: 0.93 mg/dL (ref 0.76–1.27)
GFR calc Af Amer: 92 mL/min/{1.73_m2} (ref >59)
GFR calc non Af Amer: 79 mL/min/{1.73_m2} (ref >59)
Globulin, Total: 2.1 g/dL (ref 1.5–4.5)
Glucose: 105 mg/dL — ABNORMAL HIGH (ref 65–99)
Potassium: 5.2 mmol/L (ref 3.5–5.2)
Sodium: 138 mmol/L (ref 134–144)
Total Protein: 6.4 g/dL (ref 6.0–8.5)

## 2020-05-04 LAB — LIPID PANEL
Chol/HDL Ratio: 3.9 ratio (ref 0.0–5.0)
Cholesterol, Total: 193 mg/dL (ref 100–199)
HDL: 50 mg/dL (ref >39)
LDL Chol Calc (NIH): 123 mg/dL — ABNORMAL HIGH (ref 0–99)
Triglycerides: 113 mg/dL (ref 0–149)
VLDL Cholesterol Cal: 20 mg/dL (ref 5–40)

## 2020-05-04 LAB — HEMOGLOBIN A1C
Est. average glucose Bld gHb Est-mCnc: 114 mg/dL
Hgb A1c MFr Bld: 5.6 % (ref 4.8–5.6)

## 2020-05-04 NOTE — Progress Notes (Signed)
This visit occurred during the SARS-CoV-2 public health emergency.  Safety protocols were in place, including screening questions prior to the visit, additional usage of staff PPE, and extensive cleaning of exam room while observing appropriate contact time as indicated for disinfecting solutions.  Subjective:     Patient ID: Ryan Craig , male    DOB: 08/29/1946 , 74 y.o.   MRN: [id number]   Chief Complaint  Patient presents with  . Hypertension  . Hyperlipidemia  . abnormal glucose    HPI  Hypertension This is a chronic problem. The current episode started more than 1 year ago. The problem is unchanged. The problem is controlled. Pertinent negatives include no chest pain, headaches, palpitations or shortness of breath. Risk factors for coronary artery disease include dyslipidemia, obesity and male gender. Past treatments include ACE inhibitors and calcium channel blockers. The current treatment provides significant improvement. There are no compliance problems.   Diabetes He presents for his follow-up diabetic visit. Diabetes type: prediabetes. There are no hypoglycemic associated symptoms. Pertinent negatives for hypoglycemia include no dizziness or headaches. There are no diabetic associated symptoms. Pertinent negatives for diabetes include no chest pain, no fatigue, no polydipsia, no polyphagia and no polyuria. There are no hypoglycemic complications. Symptoms are stable. Current diabetic treatment includes diet. He is compliant with treatment all of the time.     Past Medical History:  Diagnosis Date  . Anemia    no on any meds  . Arthritis   . Chronic back pain    deteriorating  . Hyperlipidemia    was on Lipitor but was taken off 3-49yr ago by medical md bc running liver enzymes up  . Hypertension    takes Amlodipine and Quinapril daily  . Joint pain   . Joint swelling      Family History  Problem Relation Age of Onset  . Heart attack Mother   . Cancer Maternal Aunt    . Cancer Paternal Aunt   . Cancer Cousin      Current Outpatient Medications:  .  amLODipine (NORVASC) 10 MG tablet, TAKE 1 TABLET BY MOUTH  DAILY, Disp: 90 tablet, Rfl: 1 .  aspirin EC 81 MG tablet, Take 81 mg by mouth daily., Disp: , Rfl:  .  Cholecalciferol (VITAMIN D-3 PO), Take 2 tablets by mouth daily. , Disp: , Rfl:  .  HYDROcodone-acetaminophen (NORCO) 5-325 MG tablet, Take 1-2 tablets by mouth every 6 (six) hours as needed for moderate pain. One to two tabs every 4-6 hours for pain, Disp: 40 tablet, Rfl: 0 .  quinapril (ACCUPRIL) 40 MG tablet, TAKE 1 TABLET BY MOUTH  DAILY, Disp: 90 tablet, Rfl: 3 .  triamcinolone cream (KENALOG) 0.1 %, APPLY TOPICALLY A THIN LAYER TO THE AFFECTED AREA(S) TWICE A DAY, Disp: 454 g, Rfl: 0   Allergies  Allergen Reactions  . Contrast Media [Iodinated Diagnostic Agents] Anaphylaxis  . Latex Rash     Review of Systems  Constitutional: Negative for fatigue.  Respiratory: Negative.  Negative for cough, shortness of breath and wheezing.   Cardiovascular: Negative.  Negative for chest pain, palpitations and leg swelling.  Endocrine: Negative for polydipsia, polyphagia and polyuria.  Genitourinary: Negative for frequency.  Musculoskeletal: Positive for arthralgias (right hand pain at times).  Neurological: Negative for dizziness and headaches.  Psychiatric/Behavioral: Negative.      Today's Vitals   05/04/20 0819  BP: 138/80  Pulse: 68  Temp: (!) 97.4 F (36.3 C)  TempSrc: Oral  Weight: 213  lb 9.6 oz (96.9 kg)  Height: 5' 5.2" (1.656 m)  PainSc: 0-No pain   Body mass index is 35.33 kg/m.   Objective:  Physical Exam Constitutional:      General: He is not in acute distress.    Appearance: Normal appearance. He is obese.  Cardiovascular:     Rate and Rhythm: Normal rate and regular rhythm.     Pulses: Normal pulses.     Heart sounds: Normal heart sounds. No murmur heard.   Pulmonary:     Effort: Pulmonary effort is normal. No  respiratory distress.     Breath sounds: Normal breath sounds. No wheezing.  Musculoskeletal:        General: Normal range of motion.     Right lower leg: No edema.     Left lower leg: No edema.  Skin:    General: Skin is warm and dry.     Capillary Refill: Capillary refill takes less than 2 seconds.  Neurological:     General: No focal deficit present.     Mental Status: He is alert and oriented to person, place, and time.     Cranial Nerves: No cranial nerve deficit.  Psychiatric:        Mood and Affect: Mood normal.        Behavior: Behavior normal.        Thought Content: Thought content normal.        Judgment: Judgment normal.         Assessment And Plan:     1. Essential hypertension . B/P is controlled.  . CMP ordered to check renal function.  . The importance of regular exercise and dietary modification was stressed to the patient. .  - CMP14+EGFR  2. Other abnormal glucose  Chronic, controlled  No current medications  Encouraged to limit intake of sugary foods and drinks  Encouraged to increase physical activity to 150 minutes per week - Hemoglobin A1c  3. Pure hypercholesterolemia  Diet controlled  Continue with diet and regular activity - Lipid panel  4. Arthralgia, unspecified joint  Right hand pain intermittently takes tylenol with good relief.    [name], FNP   I, [name], FNP, have reviewed all documentation for this visit. The documentation on 05/04/20 for the exam, diagnosis, procedures, and orders are all accurate and complete.  THE PATIENT IS ENCOURAGED TO PRACTICE SOCIAL DISTANCING DUE TO THE COVID-19 PANDEMIC.

## 2020-07-19 ENCOUNTER — Other Ambulatory Visit: Payer: Self-pay | Admitting: Nurse Practitioner

## 2020-07-22 ENCOUNTER — Emergency Department (HOSPITAL_COMMUNITY): Payer: Medicare Other

## 2020-07-22 ENCOUNTER — Encounter (HOSPITAL_COMMUNITY)
Admission: EM | Disposition: A | Discharge status: Home / Self Care | Payer: Self-pay | Source: Home / Self Care | Attending: Emergency Medicine

## 2020-07-22 ENCOUNTER — Inpatient Hospital Stay (HOSPITAL_COMMUNITY): Payer: Medicare Other

## 2020-07-22 ENCOUNTER — Other Ambulatory Visit: Payer: Self-pay

## 2020-07-22 ENCOUNTER — Ambulatory Visit (HOSPITAL_COMMUNITY)
Admission: EM | Admit: 2020-07-22 | Discharge: 2020-07-22 | Disposition: A | Discharge status: Home / Self Care | Payer: Medicare Other | Attending: Family Medicine | Admitting: Family Medicine

## 2020-07-22 ENCOUNTER — Encounter (HOSPITAL_COMMUNITY): Payer: Self-pay

## 2020-07-22 ENCOUNTER — Observation Stay (HOSPITAL_COMMUNITY)
Admission: EM | Admit: 2020-07-22 | Discharge: 2020-07-23 | Disposition: A | Discharge status: Home / Self Care | Payer: Medicare Other | Attending: Cardiovascular Disease | Admitting: Cardiovascular Disease

## 2020-07-22 ENCOUNTER — Encounter (HOSPITAL_COMMUNITY): Payer: Self-pay | Admitting: Emergency Medicine

## 2020-07-22 DIAGNOSIS — Z7982 Long term (current) use of aspirin: Secondary | ICD-10-CM | POA: Diagnosis not present

## 2020-07-22 DIAGNOSIS — I351 Nonrheumatic aortic (valve) insufficiency: Secondary | ICD-10-CM | POA: Diagnosis not present

## 2020-07-22 DIAGNOSIS — I213 ST elevation (STEMI) myocardial infarction of unspecified site: Secondary | ICD-10-CM

## 2020-07-22 DIAGNOSIS — I10 Essential (primary) hypertension: Secondary | ICD-10-CM | POA: Insufficient documentation

## 2020-07-22 DIAGNOSIS — Z87891 Personal history of nicotine dependence: Secondary | ICD-10-CM | POA: Insufficient documentation

## 2020-07-22 DIAGNOSIS — Z91040 Latex allergy status: Secondary | ICD-10-CM | POA: Insufficient documentation

## 2020-07-22 DIAGNOSIS — I517 Cardiomegaly: Secondary | ICD-10-CM | POA: Diagnosis not present

## 2020-07-22 DIAGNOSIS — R079 Chest pain, unspecified: Secondary | ICD-10-CM | POA: Diagnosis not present

## 2020-07-22 DIAGNOSIS — Z96652 Presence of left artificial knee joint: Secondary | ICD-10-CM | POA: Diagnosis not present

## 2020-07-22 DIAGNOSIS — R0602 Shortness of breath: Secondary | ICD-10-CM | POA: Diagnosis not present

## 2020-07-22 DIAGNOSIS — Z20822 Contact with and (suspected) exposure to covid-19: Secondary | ICD-10-CM | POA: Diagnosis not present

## 2020-07-22 DIAGNOSIS — I214 Non-ST elevation (NSTEMI) myocardial infarction: Secondary | ICD-10-CM | POA: Diagnosis not present

## 2020-07-22 DIAGNOSIS — I119 Hypertensive heart disease without heart failure: Secondary | ICD-10-CM | POA: Diagnosis present

## 2020-07-22 DIAGNOSIS — E785 Hyperlipidemia, unspecified: Secondary | ICD-10-CM

## 2020-07-22 DIAGNOSIS — Z79899 Other long term (current) drug therapy: Secondary | ICD-10-CM | POA: Insufficient documentation

## 2020-07-22 HISTORY — PX: LEFT HEART CATH AND CORONARY ANGIOGRAPHY: CATH118249

## 2020-07-22 LAB — BASIC METABOLIC PANEL
Anion gap: 11 (ref 5–15)
BUN: 11 mg/dL (ref 8–23)
CO2: 25 mmol/L (ref 22–32)
Calcium: 10 mg/dL (ref 8.9–10.3)
Chloride: 101 mmol/L (ref 98–111)
Creatinine, Ser: 1.02 mg/dL (ref 0.61–1.24)
GFR calc Af Amer: >60 mL/min (ref >60)
GFR calc non Af Amer: >60 mL/min (ref >60)
Glucose, Bld: 136 mg/dL — ABNORMAL HIGH (ref 70–99)
Potassium: 4.5 mmol/L (ref 3.5–5.1)
Sodium: 137 mmol/L (ref 135–145)

## 2020-07-22 LAB — RESPIRATORY PANEL BY RT PCR (FLU A&B, COVID)
Influenza A by PCR: NEGATIVE
Influenza B by PCR: NEGATIVE
SARS Coronavirus 2 by RT PCR: NEGATIVE

## 2020-07-22 LAB — APTT: aPTT: 74 seconds — ABNORMAL HIGH (ref 24–36)

## 2020-07-22 LAB — TROPONIN I (HIGH SENSITIVITY)
Troponin I (High Sensitivity): 1166 ng/L (ref <18)
Troponin I (High Sensitivity): 1519 ng/L (ref <18)
Troponin I (High Sensitivity): 1058 ng/L (ref <18)
Troponin I (High Sensitivity): 897 ng/L (ref <18)

## 2020-07-22 LAB — ECHOCARDIOGRAM COMPLETE
Height: 67 in
S' Lateral: 2.5 cm
Weight: 3280 oz

## 2020-07-22 LAB — LIPID PANEL
Cholesterol: 221 mg/dL — ABNORMAL HIGH (ref 0–200)
HDL: 58 mg/dL (ref >40)
LDL Cholesterol: 140 mg/dL — ABNORMAL HIGH (ref 0–99)
Total CHOL/HDL Ratio: 3.8 RATIO
Triglycerides: 115 mg/dL (ref <150)
VLDL: 23 mg/dL (ref 0–40)

## 2020-07-22 LAB — HEMOGLOBIN A1C
Hgb A1c MFr Bld: 5.6 % (ref 4.8–5.6)
Mean Plasma Glucose: 114.02 mg/dL

## 2020-07-22 LAB — CBC
HCT: 44 % (ref 39.0–52.0)
Hemoglobin: 14.3 g/dL (ref 13.0–17.0)
MCH: 28.8 pg (ref 26.0–34.0)
MCHC: 32.5 g/dL (ref 30.0–36.0)
MCV: 88.5 fL (ref 80.0–100.0)
Platelets: 361 10*3/uL (ref 150–400)
RBC: 4.97 MIL/uL (ref 4.22–5.81)
RDW: 12.3 % (ref 11.5–15.5)
WBC: 8.3 10*3/uL (ref 4.0–10.5)
nRBC: 0 % (ref 0.0–0.2)

## 2020-07-22 LAB — D-DIMER, QUANTITATIVE: D-Dimer, Quant: 0.39 ug/mL-FEU (ref 0.00–0.50)

## 2020-07-22 LAB — PROTIME-INR
INR: 1 (ref 0.8–1.2)
Prothrombin Time: 12.7 seconds (ref 11.4–15.2)

## 2020-07-22 IMAGING — CR DG CHEST 2V
2 series · 2 of 2 positions shown · non-contrast
Comparison: [DATE]

CLINICAL DATA: Chest pain and shortness of breath

EXAM:
CHEST - 2 VIEW

[chest pa]
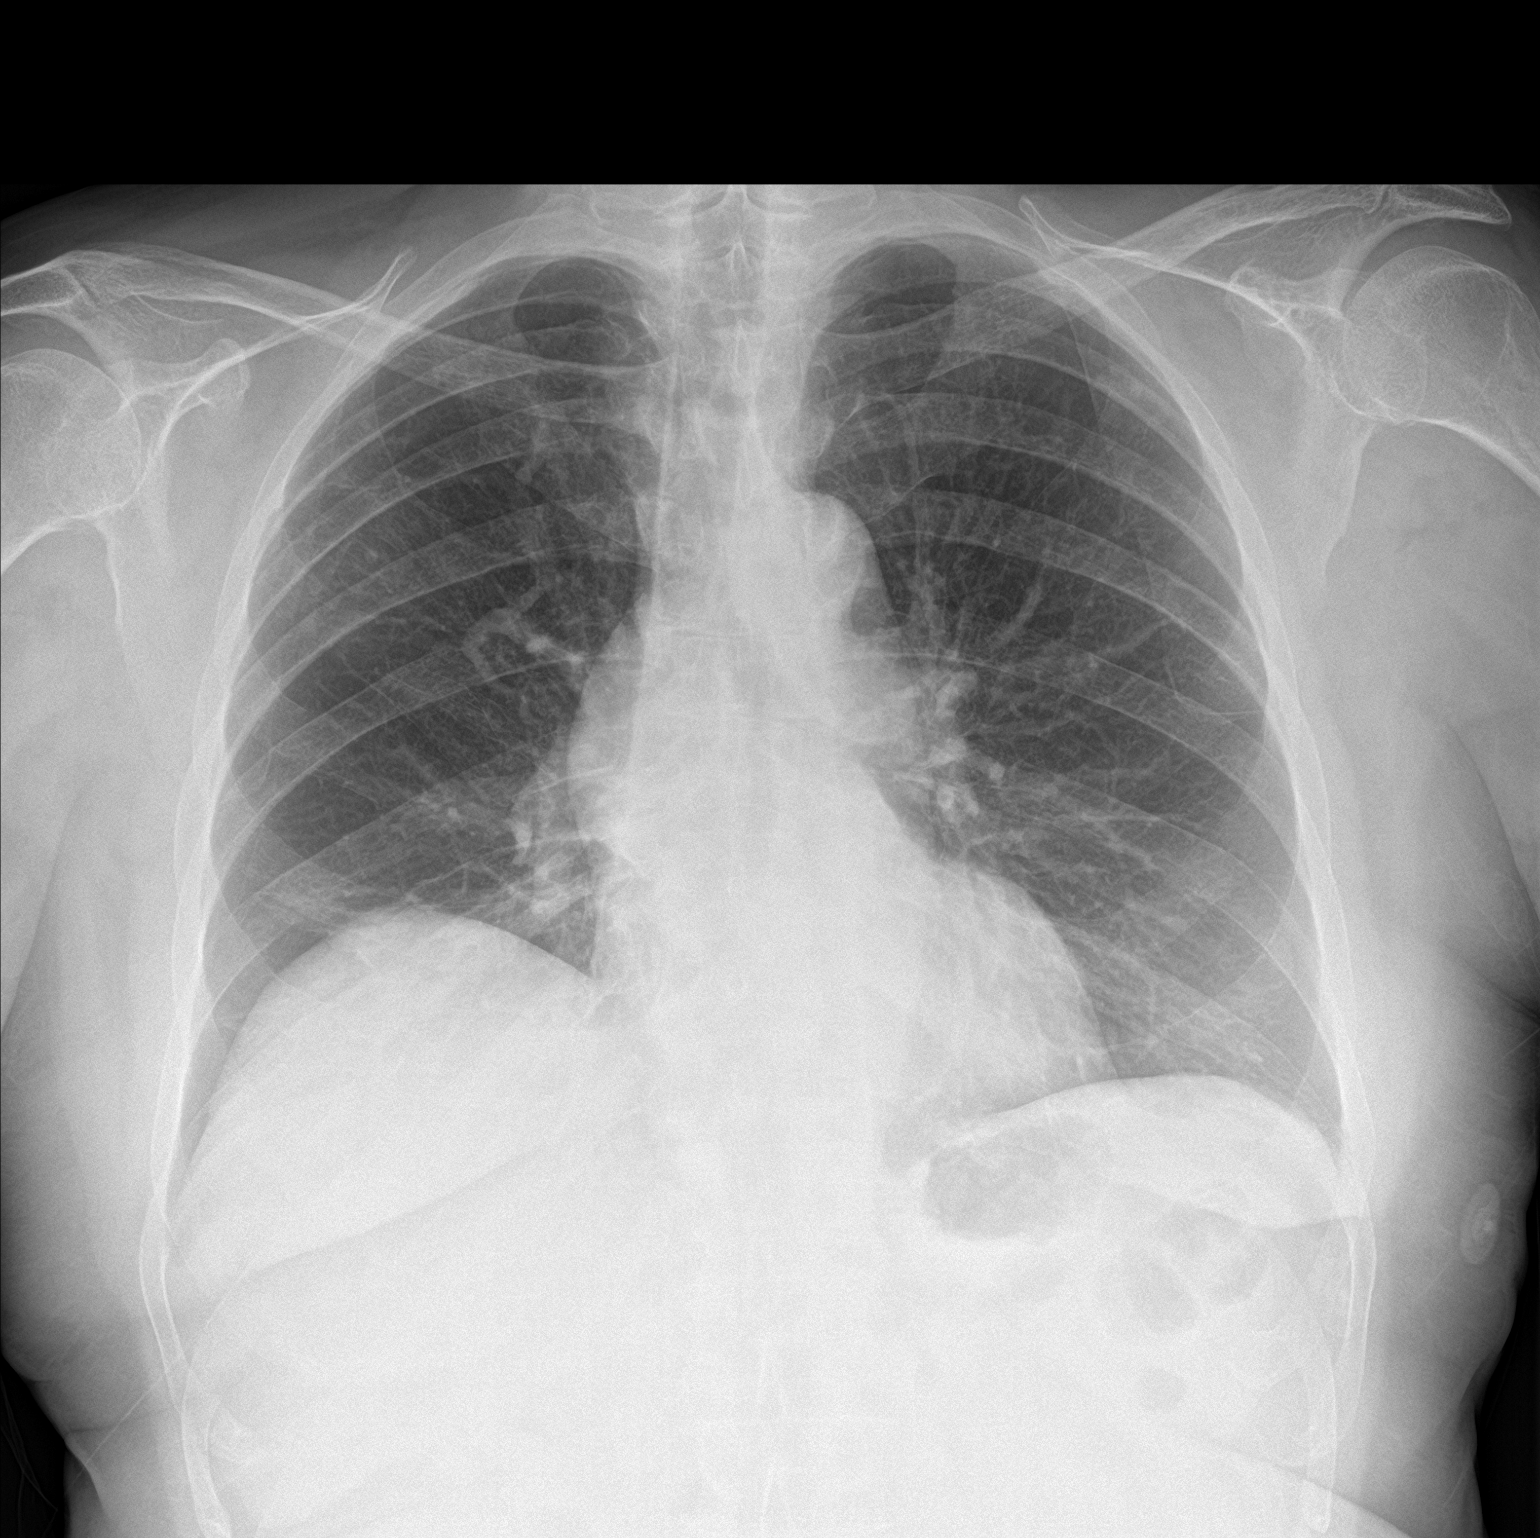

[chest lat]
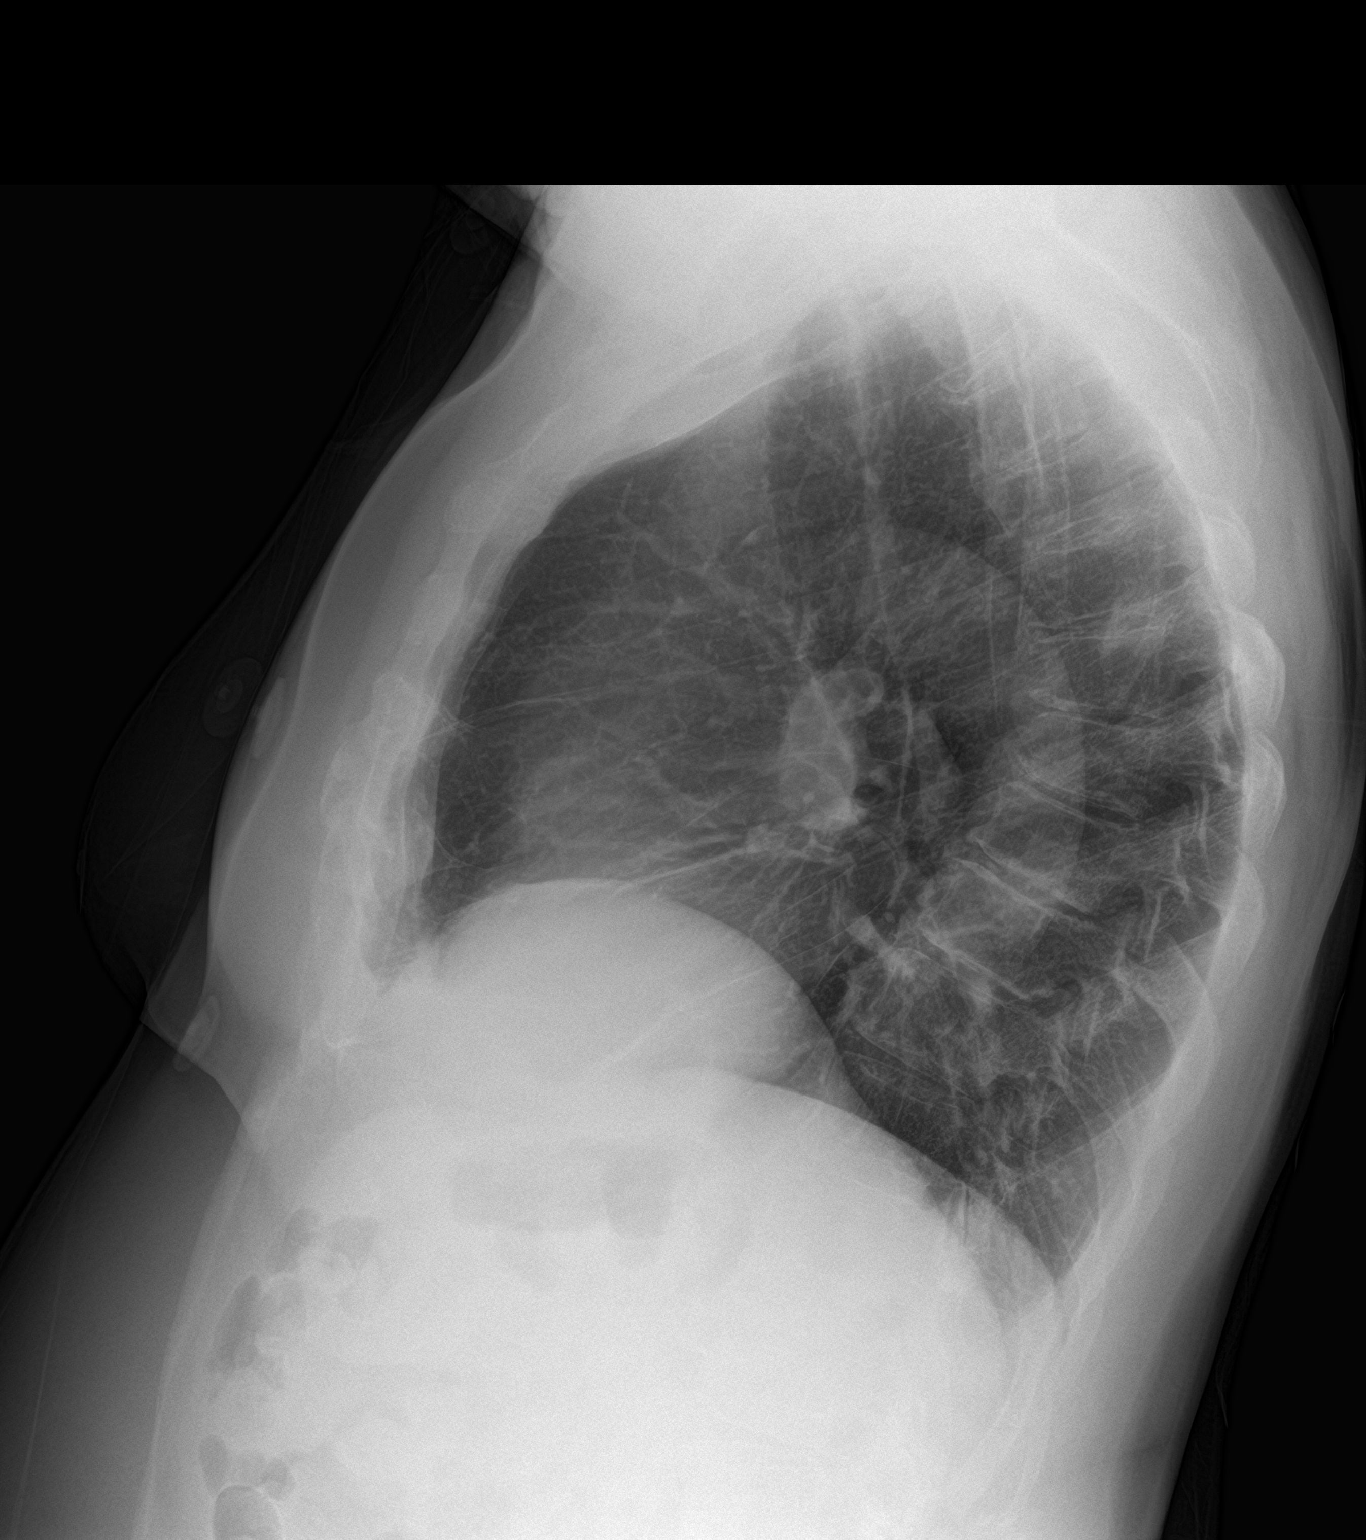

[2 of 2 positions shown; findings below may reference images not displayed]

FINDINGS: Normal heart size. Prominent ascending aortic contour. Artifact from
EKG leads. There is no edema, consolidation, effusion, or
pneumothorax. The ovoid density overlapping the midthoracic spine on
the lateral view is ossific density and best attributed to
osteophyte.
IMPRESSION: Cardiomegaly with tortuous or dilated ascending aorta, unchanged

No acute finding.

## 2020-07-22 SURGERY — LEFT HEART CATH AND CORONARY ANGIOGRAPHY
Anesthesia: LOCAL

## 2020-07-22 MED ORDER — HEPARIN SODIUM (PORCINE) 1000 UNIT/ML IJ SOLN
INTRAMUSCULAR | Status: AC
  Filled 2020-07-22: qty 1

## 2020-07-22 MED ORDER — SODIUM CHLORIDE 0.9 % IV SOLN: INTRAVENOUS | Status: AC

## 2020-07-22 MED ORDER — ACETAMINOPHEN 325 MG PO TABS: 650.0000 mg | ORAL_TABLET | ORAL | Status: DC | PRN

## 2020-07-22 MED ORDER — ONDANSETRON HCL 4 MG/2ML IJ SOLN: 4.0000 mg | Freq: Four times a day (QID) | INTRAMUSCULAR | Status: DC | PRN

## 2020-07-22 MED ORDER — LABETALOL HCL 5 MG/ML IV SOLN: 10.0000 mg | INTRAVENOUS | Status: AC | PRN

## 2020-07-22 MED ORDER — IOHEXOL 350 MG/ML SOLN
INTRAVENOUS | Status: AC
  Filled 2020-07-22: qty 1

## 2020-07-22 MED ORDER — SODIUM CHLORIDE 0.9% FLUSH: 3.0000 mL | Freq: Two times a day (BID) | INTRAVENOUS | Status: DC

## 2020-07-22 MED ORDER — HEPARIN (PORCINE) IN NACL 1000-0.9 UT/500ML-% IV SOLN
INTRAVENOUS | Status: AC
  Filled 2020-07-22: qty 1000

## 2020-07-22 MED ORDER — QUINAPRIL HCL 10 MG PO TABS
40.0000 mg | ORAL_TABLET | Freq: Every day | ORAL | Status: DC
  Administered 2020-07-23: 40 mg via ORAL
  Filled 2020-07-22 (×2): qty 4

## 2020-07-22 MED ORDER — VERAPAMIL HCL 2.5 MG/ML IV SOLN
INTRAVENOUS | Status: AC
  Filled 2020-07-22: qty 2

## 2020-07-22 MED ORDER — HEPARIN (PORCINE) IN NACL 1000-0.9 UT/500ML-% IV SOLN
INTRAVENOUS | Status: DC | PRN
  Administered 2020-07-22 (×2): 500 mL

## 2020-07-22 MED ORDER — HYDRALAZINE HCL 20 MG/ML IJ SOLN: 10.0000 mg | INTRAMUSCULAR | Status: AC | PRN

## 2020-07-22 MED ORDER — HEPARIN SODIUM (PORCINE) 1000 UNIT/ML IJ SOLN
INTRAMUSCULAR | Status: DC | PRN
  Administered 2020-07-22: 6000 [IU] via INTRAVENOUS

## 2020-07-22 MED ORDER — FENTANYL CITRATE (PF) 100 MCG/2ML IJ SOLN
INTRAMUSCULAR | Status: AC
  Filled 2020-07-22: qty 2

## 2020-07-22 MED ORDER — LIDOCAINE HCL (PF) 1 % IJ SOLN
INTRAMUSCULAR | Status: AC
  Filled 2020-07-22: qty 30

## 2020-07-22 MED ORDER — MIDAZOLAM HCL 2 MG/2ML IJ SOLN
INTRAMUSCULAR | Status: AC
  Filled 2020-07-22: qty 2

## 2020-07-22 MED ORDER — MIDAZOLAM HCL 2 MG/2ML IJ SOLN
INTRAMUSCULAR | Status: DC | PRN
  Administered 2020-07-22: 1 mg via INTRAVENOUS

## 2020-07-22 MED ORDER — IOHEXOL 350 MG/ML SOLN
INTRAVENOUS | Status: DC | PRN
  Administered 2020-07-22: 90 mL

## 2020-07-22 MED ORDER — METHYLPREDNISOLONE SODIUM SUCC 125 MG IJ SOLR
INTRAMUSCULAR | Status: AC
  Filled 2020-07-22: qty 2

## 2020-07-22 MED ORDER — OXYCODONE HCL 5 MG PO TABS: 5.0000 mg | ORAL_TABLET | ORAL | Status: DC | PRN

## 2020-07-22 MED ORDER — SODIUM CHLORIDE 0.9% FLUSH: 3.0000 mL | INTRAVENOUS | Status: DC | PRN

## 2020-07-22 MED ORDER — ASPIRIN EC 81 MG PO TBEC
81.0000 mg | DELAYED_RELEASE_TABLET | Freq: Every day | ORAL | Status: DC
  Administered 2020-07-23: 81 mg via ORAL
  Filled 2020-07-22: qty 1

## 2020-07-22 MED ORDER — DIPHENHYDRAMINE HCL 50 MG/ML IJ SOLN
INTRAMUSCULAR | Status: DC | PRN
  Administered 2020-07-22: 25 mg via INTRAVENOUS

## 2020-07-22 MED ORDER — SODIUM CHLORIDE 0.9 % IV SOLN: 250.0000 mL | INTRAVENOUS | Status: DC | PRN

## 2020-07-22 MED ORDER — MORPHINE SULFATE (PF) 2 MG/ML IV SOLN: 1.0000 mg | INTRAVENOUS | Status: DC | PRN

## 2020-07-22 MED ORDER — AMLODIPINE BESYLATE 10 MG PO TABS
10.0000 mg | ORAL_TABLET | Freq: Every day | ORAL | Status: DC
  Administered 2020-07-23: 10 mg via ORAL
  Filled 2020-07-22: qty 1

## 2020-07-22 MED ORDER — METHYLPREDNISOLONE SODIUM SUCC 125 MG IJ SOLR
INTRAMUSCULAR | Status: DC | PRN
  Administered 2020-07-22: 125 mg via INTRAVENOUS

## 2020-07-22 MED ORDER — ASPIRIN 81 MG PO CHEW
324.0000 mg | CHEWABLE_TABLET | Freq: Once | ORAL | Status: AC
  Administered 2020-07-22: 324 mg via ORAL
  Filled 2020-07-22: qty 4

## 2020-07-22 MED ORDER — FENTANYL CITRATE (PF) 100 MCG/2ML IJ SOLN
INTRAMUSCULAR | Status: DC | PRN
Start: 2020-07-22 — End: 2020-07-22
  Administered 2020-07-22: 25 ug via INTRAVENOUS

## 2020-07-22 MED ORDER — DIPHENHYDRAMINE HCL 50 MG/ML IJ SOLN
INTRAMUSCULAR | Status: AC
  Filled 2020-07-22: qty 1

## 2020-07-22 MED ORDER — VERAPAMIL HCL 2.5 MG/ML IV SOLN
INTRAVENOUS | Status: DC | PRN
  Administered 2020-07-22: 10 mL via INTRA_ARTERIAL

## 2020-07-22 MED ORDER — LIDOCAINE HCL (PF) 1 % IJ SOLN
INTRAMUSCULAR | Status: DC | PRN
  Administered 2020-07-22: 2 mL

## 2020-07-22 MED ORDER — HEPARIN SODIUM (PORCINE) 5000 UNIT/ML IJ SOLN
4000.0000 [IU] | Freq: Once | INTRAMUSCULAR | Status: AC
  Administered 2020-07-22: 4000 [IU] via INTRAVENOUS
  Filled 2020-07-22: qty 1

## 2020-07-22 MED ORDER — SODIUM CHLORIDE 0.9 % IV SOLN: INTRAVENOUS | Status: DC

## 2020-07-22 SURGICAL SUPPLY — 10 items
CATH 5FR JL3.5 JR4 ANG PIG MP (CATHETERS) ×2 IMPLANT
DEVICE RAD COMP TR BAND LRG (VASCULAR PRODUCTS) ×2 IMPLANT
GLIDESHEATH SLEND SS 6F .021 (SHEATH) ×2 IMPLANT
GUIDEWIRE INQWIRE 1.5J.035X260 (WIRE) ×1 IMPLANT
INQWIRE 1.5J .035X260CM (WIRE) ×2
KIT HEART LEFT (KITS) ×2 IMPLANT
PACK CARDIAC CATHETERIZATION (CUSTOM PROCEDURE TRAY) ×2 IMPLANT
SYR MEDRAD MARK 7 150ML (SYRINGE) ×2 IMPLANT
TRANSDUCER W/STOPCOCK (MISCELLANEOUS) ×2 IMPLANT
TUBING CIL FLEX 10 FLL-RA (TUBING) ×2 IMPLANT

## 2020-07-22 NOTE — ED Triage Notes (Signed)
Pt c/o left sternal CP describes as tight and pressure, with radiation to left arm onset last night approx 2000 approx 2.5 hours after eating dinner/fish. Pt states he began hitting his heart "because it felt like it was clogged up in there or something." Now states CP is heavy, pressure and sore. Also experienced n/v soon after onset CP with approx x5 emesis episodes, then experienced frequent urination.  Denies SOB, facial swelling, pain to back or jaw. Pt states he still feels CP 6/10. Unable to tolerate food this morning, only sips of H2O, unable to take a.m.Rx.  Had COVID vaccines in March, 2021 States he experienced n/v, chest/abdominal discomfort approx 20 years after eating fish. Pt attributes current CP and n/v to "fish" even though it was 2.5 hours after fish and has been eating fish the past 20 years w/o reactions.  Denies any recent URI sx, fever, chills. EKG performed and given to T. Rozanna Box, NP.

## 2020-07-22 NOTE — ED Provider Notes (Signed)
[facility] EMERGENCY DEPARTMENT Provider Note   CSN: [id number] Arrival date & time: 07/22/20  1114     History No chief complaint on file.   Ryan Craig is a 74 y.o. male.  Patient is a 74 year old male who presents with chest pain.  He has a history of hypertension and hyperlipidemia.  He reports chest pain that started yesterday evening.  It is continued through the night although it seems a little bit better this morning.  He had some mild associated shortness of breath.  No nausea or vomiting.  No diaphoresis.  The pain radiates to his left arm.  He describes as a tightness.  He has not taken any medications for the pain.  He initially went to urgent care and was sent here for further evaluation.        Past Medical History:  Diagnosis Date  . Anemia    no on any meds  . Arthritis   . Chronic back pain    deteriorating  . Hyperlipidemia    was on Lipitor but was taken off 3-35yrs ago by medical md bc running liver enzymes up  . Hypertension    takes Amlodipine and Quinapril daily  . Joint pain   . Joint swelling     Patient Active Problem List   Diagnosis Date Noted  . Class 2 severe obesity due to excess calories with serious comorbidity and body mass index (BMI) of 37.0 to 37.9 in adult ([place]) 11/12/2019  . Essential hypertension 01/22/2019  . Persistent cough 01/22/2019  . Right bundle branch block 11/21/2018  . Left anterior fascicular block 11/21/2018  . Former smoker 11/21/2018  . Obesity (BMI 30-39.9) 11/21/2018  . Need for vaccination 09/27/2018  . Osteoarthritis of left knee 12/22/2014  . Status post total left knee replacement 12/22/2014  . Acute medial meniscus tear of left knee 02/24/2014    Past Surgical History:  Procedure Laterality Date  . arthroscopic knee surgery Right   . COLONOSCOPY    . INGUINAL HERNIA REPAIR Bilateral    x 4  . JOINT REPLACEMENT Right 6 yrs ago   knee  . KNEE ARTHROSCOPY Left 02/24/2014    Procedure: LEFT KNEE ARTHROSCOPY WITH PARTIAL MEDIAL MENISCECTOMY;  Surgeon: Mcarthur Rossetti, MD;  Location: [facility];  Service: Orthopedics;  Laterality: Left;  . TOTAL KNEE ARTHROPLASTY Left 12/22/2014   Procedure: LEFT TOTAL KNEE ARTHROPLASTY;  Surgeon: Mcarthur Rossetti, MD;  Location: [facility];  Service: Orthopedics;  Laterality: Left;       Family History  Problem Relation Age of Onset  . Heart attack Mother   . Cancer Maternal Aunt   . Cancer Paternal Aunt   . Cancer Cousin     Social History   Tobacco Use  . Smoking status: Former Research scientist (life sciences)  . Smokeless tobacco: Former Systems developer  . Tobacco comment: quit smoking about 6-7ytrs ago  Vaping Use  . Vaping Use: Never used  Substance Use Topics  . Alcohol use: No    Comment: drank over 40 years ago  . Drug use: No    Home Medications Prior to Admission medications   Medication Sig Start Date End Date Taking? Authorizing Provider  amLODipine (NORVASC) 10 MG tablet TAKE 1 TABLET BY MOUTH  DAILY 03/25/20   [name], FNP  aspirin EC 81 MG tablet Take 81 mg by mouth daily.    [provider]  Cholecalciferol (VITAMIN D-3 PO) Take 2 tablets by mouth daily.  [provider]  HYDROcodone-acetaminophen (NORCO) 5-325 MG tablet Take 1-2 tablets by mouth every 6 (six) hours as needed for moderate pain. One to two tabs every 4-6 hours for pain 11/06/19   Mcarthur Rossetti, MD  quinapril (ACCUPRIL) 40 MG tablet TAKE 1 TABLET BY MOUTH  DAILY 07/20/20   [name], FNP  triamcinolone cream (KENALOG) 0.1 % APPLY TOPICALLY A THIN LAYER TO THE AFFECTED AREA(S) TWICE A DAY 12/26/19   [name], FNP    Allergies    Contrast media [iodinated diagnostic agents] and Latex  Review of Systems   Review of Systems  Constitutional: Negative for chills, diaphoresis, fatigue and fever.  HENT: Negative for congestion, rhinorrhea and sneezing.   Eyes: Negative.   Respiratory: Positive for chest tightness and shortness of  breath. Negative for cough.   Cardiovascular: Positive for chest pain. Negative for leg swelling.  Gastrointestinal: Negative for abdominal pain, blood in stool, diarrhea, nausea and vomiting.  Genitourinary: Negative for difficulty urinating, flank pain, frequency and hematuria.  Musculoskeletal: Negative for arthralgias and back pain.  Skin: Negative for rash.  Neurological: Negative for dizziness, speech difficulty, weakness, numbness and headaches.    Physical Exam Updated Vital Signs BP (!) 167/96 (BP Location: Right Arm)   Pulse 80   Temp 98 F (36.7 C) (Oral)   Resp 19   Ht 5\' 7"  (1.702 m)   Wt 93 kg   SpO2 99%   BMI 32.11 kg/m   Physical Exam Constitutional:      Appearance: He is well-developed.  HENT:     Head: Normocephalic and atraumatic.  Eyes:     Pupils: Pupils are equal, round, and reactive to light.  Cardiovascular:     Rate and Rhythm: Normal rate and regular rhythm.     Heart sounds: Normal heart sounds.  Pulmonary:     Effort: Pulmonary effort is normal. No respiratory distress.     Breath sounds: Normal breath sounds. No wheezing or rales.  Chest:     Chest wall: No tenderness.  Abdominal:     General: Bowel sounds are normal.     Palpations: Abdomen is soft.     Tenderness: There is no abdominal tenderness. There is no guarding or rebound.  Musculoskeletal:        General: Normal range of motion.     Cervical back: Normal range of motion and neck supple.  Lymphadenopathy:     Cervical: No cervical adenopathy.  Skin:    General: Skin is warm and dry.     Findings: No rash.  Neurological:     Mental Status: He is alert and oriented to person, place, and time.     ED Results / Procedures / Treatments   Labs (all labs ordered are listed, but only abnormal results are displayed) Labs Reviewed  BASIC METABOLIC PANEL - Abnormal; Notable for the following components:      Result Value   Glucose, Bld 136 (*)    All other components within  normal limits  TROPONIN I (HIGH SENSITIVITY) - Abnormal; Notable for the following components:   Troponin I (High Sensitivity) 1,166 (*)    All other components within normal limits  RESPIRATORY PANEL BY RT PCR (FLU A&B, COVID)  CBC  HEMOGLOBIN A1C  PROTIME-INR  APTT  LIPID PANEL  TROPONIN I (HIGH SENSITIVITY)    EKG EKG Interpretation  Date/Time:  Thursday July 22 2020 13:19:12 EDT Ventricular Rate:  81 PR Interval:    QRS Duration:  133 QT Interval:  430 QTC Calculation: 500 R Axis:   -75 Text Interpretation: Sinus rhythm Supraventricular bigeminy RBBB and LAFB Inferior infarct, acute >>> Acute MI <<< Confirmed by Malvin Johns [id number]) on 07/22/2020 1:21:22 PM   Radiology DG Chest 2 View  Result Date: 07/22/2020 CLINICAL DATA:  Chest pain and shortness of breath EXAM: CHEST - 2 VIEW COMPARISON:  02/24/2014 FINDINGS: Normal heart size. Prominent ascending aortic contour. Artifact from EKG leads. There is no edema, consolidation, effusion, or pneumothorax. The ovoid density overlapping the midthoracic spine on the lateral view is ossific density and best attributed to osteophyte. IMPRESSION: Cardiomegaly with tortuous or dilated ascending aorta, unchanged from 2015. No acute finding. Electronically Signed   By: Monte Fantasia M.D.   On: 07/22/2020 12:05    Procedures Procedures (including critical care time)  Medications Ordered in ED Medications  0.9 %  sodium chloride infusion (has no administration in time range)  aspirin chewable tablet 324 mg (324 mg Oral Given 07/22/20 1330)  heparin injection 4,000 Units (4,000 Units Intravenous Given 07/22/20 1332)    ED Course  I have reviewed the triage vital signs and the nursing notes.  Pertinent labs & imaging results that were available during my care of the patient were reviewed by me and considered in my medical decision making (see chart for details).    MDM Rules/Calculators/A&P                          Patient  is a 74 year old male who presents with chest pain.  He still having pain although it is improved.  His initial EKG was reviewed by me and appeared to be similar to prior EKGs.  However just after he got pulled back to the room, he got a second EKG which appeared to have some inferior mild elevation with some reciprocal changes.  It is subtle, however his troponin is also markedly elevated at 1160.  STEMI was activated.  I spoke with Dr. Angelena Form who will take the patient to the Cath Lab.  Patient was given aspirin and heparin prior to being taken to the Cath Lab.  His other labs are reviewed and are nonconcerning.  His chest x-ray shows a tortuous aorta which is unchanged from prior films.  This was reviewed by me.  CRITICAL CARE Performed by: Malvin Johns Total critical care time: 30 minutes Critical care time was exclusive of separately billable procedures and treating other patients. Critical care was necessary to treat or prevent imminent or life-threatening deterioration. Critical care was time spent personally by me on the following activities: development of treatment plan with patient and/or surrogate as well as nursing, discussions with consultants, evaluation of patient's response to treatment, examination of patient, obtaining history from patient or surrogate, ordering and performing treatments and interventions, ordering and review of laboratory studies, ordering and review of radiographic studies, pulse oximetry and re-evaluation of patient's condition.  Final Clinical Impression(s) / ED Diagnoses Final diagnoses:  ST elevation myocardial infarction (STEMI), unspecified artery [facility])    Rx / DC Orders ED Discharge Orders    None       Malvin Johns, MD 07/22/20 1340

## 2020-07-22 NOTE — ED Triage Notes (Signed)
Pt here from UC with c/o chest pain that woke him in the middle of night , radiating down left arm along with some nausea

## 2020-07-22 NOTE — ED Notes (Signed)
Explained to pt and wife that pt needs to go to ER STAT for cardiac workup and to notify ER staff of active CP, and CP last night with n/v and radiating pain to left arm. Also advised pt to notify ER staff STAT if his CP worsens, becomes diaphoretic, n/v. Patient is being discharged from the Urgent Care and sent to the Emergency Department via POV. Per T. Rozanna Box, NP patient is in need of higher level of care due to CP with radiation to left arm, n/v and EKG bifascicular block. Patient is aware and verbalizes understanding of plan of care.  Vitals:   07/22/20 1044  BP: (!) 161/87  Pulse: 93  Resp: 19  Temp: 98.9 F (37.2 C)  SpO2: 99%

## 2020-07-22 NOTE — ED Notes (Signed)
Pt to cath lab.

## 2020-07-22 NOTE — Progress Notes (Signed)
  Echocardiogram 2D Echocardiogram has been performed.  Ryan Craig 07/22/2020, 5:21 PM

## 2020-07-22 NOTE — H&P (Signed)
Cardiology Admission History and Physical:   Patient ID: Ryan Craig MRN: [id number]; DOB: 10/03/1946   Admission date: 07/22/2020  Primary Care Provider: [name], [facility] Cardiologist: Candee Furbish, MD   Chief Complaint:  Chest pain  Patient Profile:   Ryan Craig is a 74 y.o. male with history of HTN, HLD who presented to the ED today with c/o chest pain since last night. Onset of severe central chest pain last night with associated shortness of breath.   History of Present Illness:   Ryan Craig is a 74 y.o. male with history of HTN, HLD who presented to the ED today with c/o chest pain since last night. Onset of severe central chest pain last night with associated shortness of breath. EKG with subtle inferior ST elevation. Troponin 1100. Ongoing chest pain in the ED. Code STEMI called by the ED staff. Pt evaluated in the ED. EKG does not meet STEMI criteria but with ongoing pain we elected to proceed with a cardiac cath.    Past Medical History:  Diagnosis Date  . Anemia    no on any meds  . Arthritis   . Chronic back pain    deteriorating  . Hyperlipidemia    was on Lipitor but was taken off 3-105yrs ago by medical md bc running liver enzymes up  . Hypertension    takes Amlodipine and Quinapril daily  . Joint pain   . Joint swelling     Past Surgical History:  Procedure Laterality Date  . arthroscopic knee surgery Right   . COLONOSCOPY    . INGUINAL HERNIA REPAIR Bilateral    x 4  . JOINT REPLACEMENT Right 6 yrs ago   knee  . KNEE ARTHROSCOPY Left 02/24/2014   Procedure: LEFT KNEE ARTHROSCOPY WITH PARTIAL MEDIAL MENISCECTOMY;  Surgeon: Mcarthur Rossetti, MD;  Location: [facility];  Service: Orthopedics;  Laterality: Left;  . TOTAL KNEE ARTHROPLASTY Left 12/22/2014   Procedure: LEFT TOTAL KNEE ARTHROPLASTY;  Surgeon: Mcarthur Rossetti, MD;  Location: [facility];  Service: Orthopedics;  Laterality: Left;     Medications Prior to Admission: Prior to  Admission medications   Medication Sig Start Date End Date Taking? Authorizing Provider  amLODipine (NORVASC) 10 MG tablet TAKE 1 TABLET BY MOUTH  DAILY 03/25/20   [name], FNP  aspirin EC 81 MG tablet Take 81 mg by mouth daily.    [provider]  Cholecalciferol (VITAMIN D-3 PO) Take 2 tablets by mouth daily.     [provider]  HYDROcodone-acetaminophen (NORCO) 5-325 MG tablet Take 1-2 tablets by mouth every 6 (six) hours as needed for moderate pain. One to two tabs every 4-6 hours for pain 11/06/19   Mcarthur Rossetti, MD  quinapril (ACCUPRIL) 40 MG tablet TAKE 1 TABLET BY MOUTH  DAILY 07/20/20   [name], FNP  triamcinolone cream (KENALOG) 0.1 % APPLY TOPICALLY A THIN LAYER TO THE AFFECTED AREA(S) TWICE A DAY 12/26/19   [name], FNP     Allergies:    Allergies  Allergen Reactions  . Contrast Media [Iodinated Diagnostic Agents] Anaphylaxis  . Latex Rash    Social History:   Social History   Socioeconomic History  . Marital status: Married    Spouse name: Not on file  . Number of children: Not on file  . Years of education: Not on file  . Highest education level: Not on file  Occupational History  . Occupation: retired  Tobacco Use  . Smoking  status: Former Research scientist (life sciences)  . Smokeless tobacco: Former Systems developer  . Tobacco comment: quit smoking about 6-7ytrs ago  Vaping Use  . Vaping Use: Never used  Substance and Sexual Activity  . Alcohol use: No    Comment: drank over 40 years ago  . Drug use: No  . Sexual activity: Yes  Other Topics Concern  . Not on file  Social History Narrative  . Not on file   Social Determinants of Health   Financial Resource Strain: Low Risk   . Difficulty of Paying Living Expenses: Not hard at all  Food Insecurity: No Food Insecurity  . Worried About Charity fundraiser in the Last Year: Never true  . Ran Out of Food in the Last Year: Never true  Transportation Needs: No Transportation Needs  . Lack of  Transportation (Medical): No  . Lack of Transportation (Non-Medical): No  Physical Activity: Inactive  . Days of Exercise per Week: 0 days  . Minutes of Exercise per Session: 0 min  Stress: No Stress Concern Present  . Feeling of Stress : Not at all  Social Connections:   . Frequency of Communication with Friends and Family: Not on file  . Frequency of Social Gatherings with Friends and Family: Not on file  . Attends Religious Services: Not on file  . Active Member of Clubs or Organizations: Not on file  . Attends Archivist Meetings: Not on file  . Marital Status: Not on file  Intimate Partner Violence:   . Fear of Current or Ex-Partner: Not on file  . Emotionally Abused: Not on file  . Physically Abused: Not on file  . Sexually Abused: Not on file    Family History:   The patient's family history includes Cancer in his cousin, maternal aunt, and paternal aunt; Heart attack in his mother.    ROS:  Please see the history of present illness.  All other ROS reviewed and negative.     Physical Exam/Data:   Vitals:   07/22/20 1126 07/22/20 1312  BP: 140/74 (!) 167/96  Pulse: 97 80  Resp: 18 19  Temp: 98.6 F (37 C) 98 F (36.7 C)  TempSrc: Oral Oral  SpO2: 99% 99%  Weight: 93 kg   Height: 5\' 7"  (1.702 m)    No intake or output data in the 24 hours ending 07/22/20 1344 Last 3 Weights 07/22/2020 05/04/2020 11/24/2019  Weight (lbs) 205 lb 213 lb 9.6 oz 214 lb  Weight (kg) 92.987 kg 96.888 kg 97.07 kg     Body mass index is 32.11 kg/m.  General:  Well nourished, well developed, in no acute distress HEENT: normal Lymph: no adenopathy Neck: no JVD Endocrine:  No thryomegaly Vascular: No carotid bruits; FA pulses 2+ bilaterally without bruits  Cardiac:  normal S1, S2; RRR; no murmur  Lungs:  clear to auscultation bilaterally, no wheezing, rhonchi or rales  Abd: soft, nontender, no hepatomegaly  Ext: no LE edema Musculoskeletal:  No deformities, BUE and BLE  strength normal and equal Skin: warm and dry  Neuro:  CNs 2-12 intact, no focal abnormalities noted Psych:  Normal affect    EKG:  The ECG that was done was personally reviewed and demonstrates sinus rhythm with RBBB and 0.5 mm inferior ST elevation  Relevant CV Studies:  Echo February 2020:  1. The left ventricle has normal systolic function of 46-96%. The cavity  size was normal. There is mildly increased left ventricular wall  thickness. Echo evidence of  impaired diastolic relaxation.  2. The right ventricle has normal systolic function. The cavity was  normal. There is no increase in right ventricular wall thickness. Right  ventricular systolic pressure is mildly elevated with an estimated  pressure of 32.2 mmHg.  3. The mitral valve is normal in structure.  4. The tricuspid valve is normal in structure.  5. The aortic valve is tricuspid There is mild thickening of the aortic  valve. Aortic valve regurgitation is mild by color flow Doppler.  6. The pulmonic valve was normal in structure. Pulmonic valve  regurgitation is mild by color flow Doppler.  7. There is mild dilatation of the aortic root.  8. Normal LV systolic function; mild LVH; mild diastolic dysfunction;  mildly dilated aortic root; sclerotic aortic valve with mild AI; mild TR;  mild pulmonary hypertension.   Laboratory Data:  High Sensitivity Troponin:   Recent Labs  Lab 07/22/20 1138  TROPONINIHS 1,166*      Chemistry Recent Labs  Lab 07/22/20 1138  NA 137  K 4.5  CL 101  CO2 25  GLUCOSE 136*  BUN 11  CREATININE 1.02  CALCIUM 10.0  GFRNONAA >60  GFRAA >60  ANIONGAP 11    No results for input(s): PROT, ALBUMIN, AST, ALT, ALKPHOS, BILITOT in the last 168 hours. Hematology Recent Labs  Lab 07/22/20 1138  WBC 8.3  RBC 4.97  HGB 14.3  HCT 44.0  MCV 88.5  MCH 28.8  MCHC 32.5  RDW 12.3  PLT 361   BNPNo results for input(s): BNP, PROBNP in the last 168 hours.  DDimer No results  for input(s): DDIMER in the last 168 hours.   Radiology/Studies:  DG Chest 2 View  Result Date: 07/22/2020 CLINICAL DATA:  Chest pain and shortness of breath EXAM: CHEST - 2 VIEW COMPARISON:  02/24/2014 FINDINGS: Normal heart size. Prominent ascending aortic contour. Artifact from EKG leads. There is no edema, consolidation, effusion, or pneumothorax. The ovoid density overlapping the midthoracic spine on the lateral view is ossific density and best attributed to osteophyte. IMPRESSION: Cardiomegaly with tortuous or dilated ascending aorta, unchanged from 2015. No acute finding. Electronically Signed   By: Monte Fantasia M.D.   On: 07/22/2020 12:05    Assessment and Plan:   1. NSTEMI: EKG does not meet STEMI criteria but with ongoing chest pain, elevated troponin and abnormal EKG, will proceed with urgent cardiac catheterization with possible PCI. Pretreatment for possible contrast allergy with IV solumedrol and benadryl.   2. HTN: Continue home medications  Severity of Illness: The appropriate patient status for this patient is INPATIENT. Inpatient status is judged to be reasonable and necessary in order to provide the required intensity of service to ensure the patient's safety. The patient's presenting symptoms, physical exam findings, and initial radiographic and laboratory data in the context of their chronic comorbidities is felt to place them at high risk for further clinical deterioration. Furthermore, it is not anticipated that the patient will be medically stable for discharge from the hospital within 2 midnights of admission. The following factors support the patient status of inpatient.   " The patient's presenting symptoms include chest pain. " The worrisome physical exam findings include  " The initial radiographic and laboratory data are worrisome because of abnormal EKG " The chronic co-morbidities include HTN, HLD   * I certify that at the point of admission it is my  clinical judgment that the patient will require inpatient hospital care spanning beyond 2 midnights from the  point of admission due to high intensity of service, high risk for further deterioration and high frequency of surveillance required.*    For questions or updates, please contact [facility] Please consult [email or web address] for contact info under     Signed, Lauree Chandler, MD  07/22/2020 1:44 PM

## 2020-07-22 NOTE — ED Provider Notes (Addendum)
74 year old male with left-sided chest pain, tightness and pressure that started last night.  The chest pain continues today.  Described as heavy and pressure and sore.  Had multiple episodes of nausea vomiting.  Started after eating fish. Patient with multiple risk factors of ACS.  EKG today similar to previous EKG based on symptoms, history we will had sent to the ER for further evaluation rule out cardiac cause. VSS and wife will take to the ER   Physical Exam Vitals reviewed.  Constitutional:      General: He is not in acute distress.    Appearance: He is not ill-appearing or diaphoretic.  Pulmonary:     Effort: Pulmonary effort is normal.  Skin:    General: Skin is warm and dry.  Neurological:     Mental Status: He is alert.  Psychiatric:        Mood and Affect: Mood normal.      Sending to the ER for further management.   Orvan July, NP 07/22/20 1106    Loura Halt A, NP 07/22/20 1109

## 2020-07-22 NOTE — Discharge Instructions (Addendum)
Go to the ER.

## 2020-07-22 NOTE — ED Notes (Signed)
Cardiology at bedside.

## 2020-07-23 ENCOUNTER — Inpatient Hospital Stay (HOSPITAL_COMMUNITY): Payer: Medicare Other

## 2020-07-23 ENCOUNTER — Encounter (HOSPITAL_COMMUNITY): Payer: Self-pay | Admitting: Cardiovascular Disease

## 2020-07-23 DIAGNOSIS — R778 Other specified abnormalities of plasma proteins: Secondary | ICD-10-CM | POA: Diagnosis not present

## 2020-07-23 DIAGNOSIS — Z91040 Latex allergy status: Secondary | ICD-10-CM | POA: Diagnosis not present

## 2020-07-23 DIAGNOSIS — E782 Mixed hyperlipidemia: Secondary | ICD-10-CM

## 2020-07-23 DIAGNOSIS — I10 Essential (primary) hypertension: Secondary | ICD-10-CM

## 2020-07-23 DIAGNOSIS — Z79899 Other long term (current) drug therapy: Secondary | ICD-10-CM | POA: Diagnosis not present

## 2020-07-23 DIAGNOSIS — E785 Hyperlipidemia, unspecified: Secondary | ICD-10-CM

## 2020-07-23 DIAGNOSIS — I214 Non-ST elevation (NSTEMI) myocardial infarction: Secondary | ICD-10-CM | POA: Diagnosis not present

## 2020-07-23 DIAGNOSIS — Z7982 Long term (current) use of aspirin: Secondary | ICD-10-CM | POA: Diagnosis not present

## 2020-07-23 DIAGNOSIS — Z96652 Presence of left artificial knee joint: Secondary | ICD-10-CM | POA: Diagnosis not present

## 2020-07-23 DIAGNOSIS — R079 Chest pain, unspecified: Secondary | ICD-10-CM | POA: Diagnosis not present

## 2020-07-23 DIAGNOSIS — Z20822 Contact with and (suspected) exposure to covid-19: Secondary | ICD-10-CM | POA: Diagnosis not present

## 2020-07-23 DIAGNOSIS — Z87891 Personal history of nicotine dependence: Secondary | ICD-10-CM | POA: Diagnosis not present

## 2020-07-23 LAB — BASIC METABOLIC PANEL
Anion gap: 10 (ref 5–15)
BUN: 17 mg/dL (ref 8–23)
CO2: 21 mmol/L — ABNORMAL LOW (ref 22–32)
Calcium: 9.4 mg/dL (ref 8.9–10.3)
Chloride: 106 mmol/L (ref 98–111)
Creatinine, Ser: 1.02 mg/dL (ref 0.61–1.24)
GFR calc Af Amer: >60 mL/min (ref >60)
GFR calc non Af Amer: >60 mL/min (ref >60)
Glucose, Bld: 162 mg/dL — ABNORMAL HIGH (ref 70–99)
Potassium: 4.4 mmol/L (ref 3.5–5.1)
Sodium: 137 mmol/L (ref 135–145)

## 2020-07-23 LAB — CBC
HCT: 41.3 % (ref 39.0–52.0)
Hemoglobin: 13.2 g/dL (ref 13.0–17.0)
MCH: 27.9 pg (ref 26.0–34.0)
MCHC: 32 g/dL (ref 30.0–36.0)
MCV: 87.3 fL (ref 80.0–100.0)
Platelets: 333 10*3/uL (ref 150–400)
RBC: 4.73 MIL/uL (ref 4.22–5.81)
RDW: 12.2 % (ref 11.5–15.5)
WBC: 9.7 10*3/uL (ref 4.0–10.5)
nRBC: 0 % (ref 0.0–0.2)

## 2020-07-23 IMAGING — MR MR CARD MORPHOLOGY WO/W CM
44 of 47 series · 44 of 47 positions shown · IV contrast (multihance)
Comparison: none

CLINICAL DATA: 74-year-old male who was admitted with ST elevations
and elevated troponin and with mild non-obstructive CAD on the
cardiac cath.

EXAM:
CARDIAC MRI
TECHNIQUE: The patient was scanned on a 1.5 Tesla GE magnet. A dedicated
cardiac coil was used. Functional imaging was done using [employer]
sequences. [DATE], and 4 chamber views were done to assess for RWMA's.
Modified LUGONES rule using a short axis stack was used to
calculate an ejection fraction on a dedicated work station using
[employer] software. The patient received 12 cc of Multihance. After 10
minutes inversion recovery sequences were used to assess for
infiltration and scar tissue.
CONTRAST:  12 cc  of Multihance

[Series 4: [id number]_bh · axial · 8.0mm · 1.48mm/px · 1 of 16 slices shown]
[im 1/16]
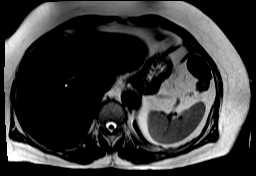

[Series 8: bSSFP · oblique · 8.0mm · 1.61mm/px · 1 of 25 slices shown (1 of 19)]
[im 1/25]
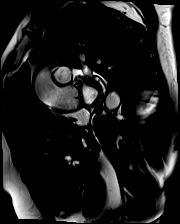

[Series 9: bSSFP · oblique · 8.0mm · 1.61mm/px · 1 of 25 slices shown (2 of 19)]
[im 1/25]
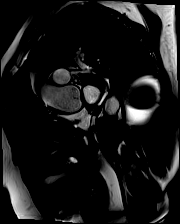

[Series 10: bSSFP · oblique · 8.0mm · 1.61mm/px · 1 of 25 slices shown (3 of 19)]
[im 1/25]
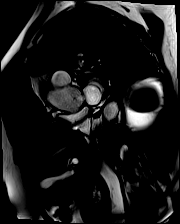

[Series 11: bSSFP · oblique · 8.0mm · 1.61mm/px · 1 of 25 slices shown (4 of 19)]
[im 1/25]
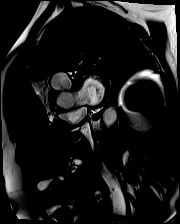

[Series 12: bSSFP · oblique · 8.0mm · 1.61mm/px · 1 of 25 slices shown (5 of 19)]
[im 1/25]
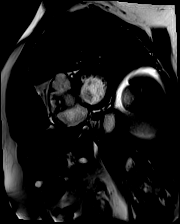

[Series 13: bSSFP · oblique · 8.0mm · 1.61mm/px · 1 of 25 slices shown (6 of 19)]
[im 1/25]
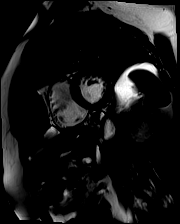

[Series 14: bSSFP · oblique · 8.0mm · 1.61mm/px · 1 of 25 slices shown (7 of 19)]
[im 1/25]
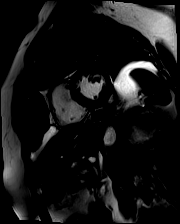

[Series 15: bSSFP · oblique · 8.0mm · 1.61mm/px · 1 of 25 slices shown (8 of 19)]
[im 1/25]
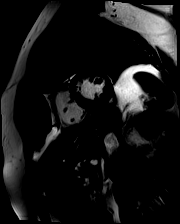

[Series 16: bSSFP · oblique · 8.0mm · 1.61mm/px · 1 of 25 slices shown (9 of 19)]
[im 1/25]
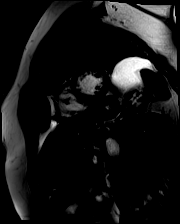

[Series 17: bSSFP · oblique · 8.0mm · 1.61mm/px · 1 of 25 slices shown (10 of 19)]
[im 1/25]
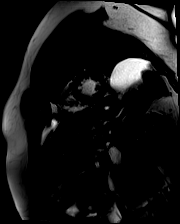

[Series 18: bSSFP · oblique · 8.0mm · 1.61mm/px · 1 of 25 slices shown (11 of 19)]
[im 1/25]
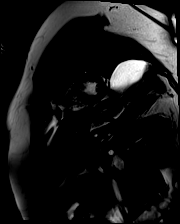

[Series 19: bSSFP · oblique · 8.0mm · 1.61mm/px · 1 of 25 slices shown (12 of 19)]
[im 1/25]
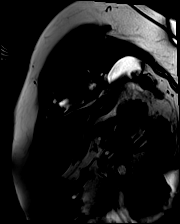

[Series 20: bSSFP · oblique · 8.0mm · 1.61mm/px · 1 of 25 slices shown (13 of 19)]
[im 1/25]
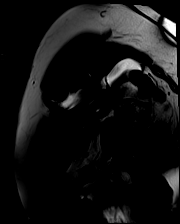

[Series 21: bSSFP · oblique · 8.0mm · 1.61mm/px · 1 of 25 slices shown (14 of 19)]
[im 1/25]
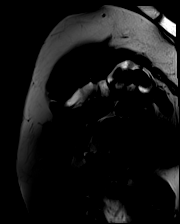

[Series 22: bSSFP · oblique · 8.0mm · 1.61mm/px · 1 of 25 slices shown (15 of 19)]
[im 1/25]
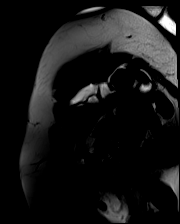

[Series 23: (id)_long_t1 · oblique · 8.0mm · 1.60mm/px · 1 of 24 slices shown]
[im 1/24]
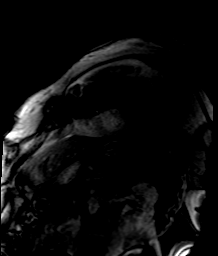

[Series 24: (id)_long_t1_moco · oblique · 8.0mm · 1.60mm/px · 1 of 24 slices shown]
[im 1/24]
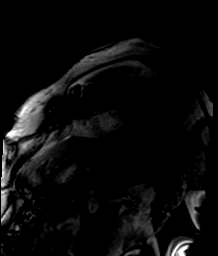

[Series 25: (id)_long_t1_moco_t1 · oblique · 8.0mm · 1.60mm/px · 1 of 3 slices shown (1 of 2)]
[im 1/3]
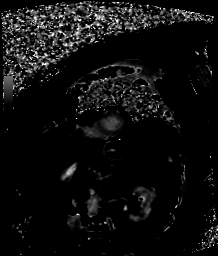

[Series 25: (id)_long_t1_moco_t1 · 1 of 3 slices shown (2 of 2)]
[im 1/3]
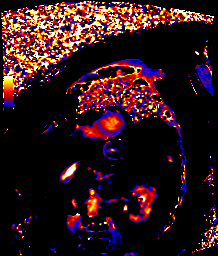

[Series 27: (id)_trufi · oblique · 8.0mm · 2.14mm/px · 1 of 9 slices shown]
[im 1/9]
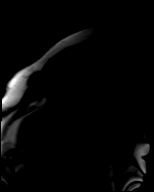

[Series 28: (id)_trufi_moco · oblique · 8.0mm · 2.14mm/px · 1 of 9 slices shown]
[im 1/9]
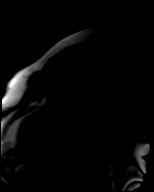

[Series 29: (id)_trufi_moco_t2 · oblique · 8.0mm · 2.14mm/px · 1 of 3 slices shown]
[im 1/3]
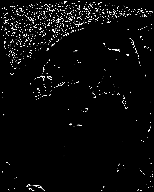

[Series 31: bSSFP · axial · 6.0mm · 1.56mm/px · 1 of 25 slices shown (16 of 19)]
[im 1/25]
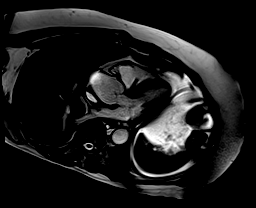

[Series 32: bSSFP · oblique · 6.0mm · 1.45mm/px · 1 of 25 slices shown (17 of 19)]
[im 1/25]
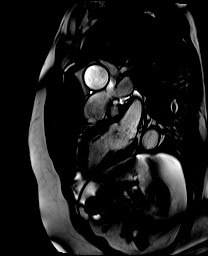

[Series 33: bSSFP · axial · 6.0mm · 1.41mm/px · 1 of 25 slices shown (18 of 19)]
[im 1/25]
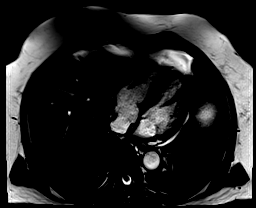

[Series 34: bSSFP · coronal · 6.0mm · 1.48mm/px · 1 of 25 slices shown (19 of 19)]
[im 1/25]
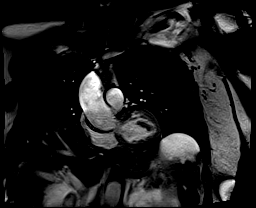

[Series 35: aortic valve cine · oblique · 6.0mm · 1.41mm/px · 1 of 25 slices shown]
[im 1/25]
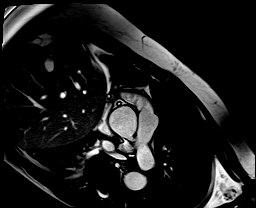

[Series 36: cine rvit · oblique · 6.0mm · 1.48mm/px · 1 of 25 slices shown]
[im 1/25]
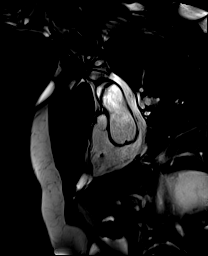

[Series 37: cine rvot · sagittal · 6.0mm · 1.41mm/px · 1 of 25 slices shown]
[im 1/25]
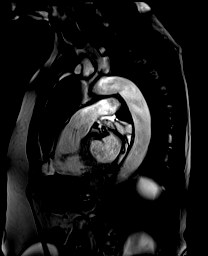

[Series 39: lge_single shot sa · oblique · 8.0mm · 1.98mm/px · 1 of 10 slices shown (1 of 2)]
[im 1/10]
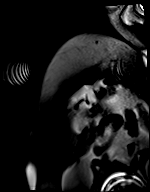

[Series 40: lge_single shot sa · oblique · 8.0mm · 1.98mm/px · 1 of 10 slices shown (2 of 2)]
[im 1/10]
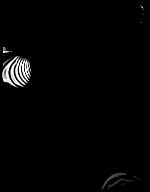

[Series 43: lge_single shot 4 · axial · 6.0mm · 1.98mm/px · 1 of 1 slices shown (1 of 2)]
[im 1/1]
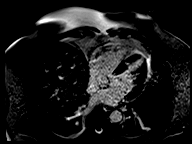

[Series 44: lge_single shot 4 · axial · 6.0mm · 1.98mm/px · 1 of 1 slices shown (2 of 2)]
[im 1/1]
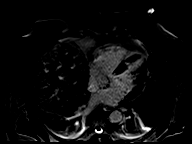

[Series 49: lge_single shot 3 · axial · 6.0mm · 1.98mm/px · 1 of 1 slices shown (1 of 2)]
[im 1/1]
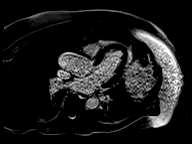

[Series 50: lge_single shot 3 · axial · 6.0mm · 1.98mm/px · 1 of 1 slices shown (2 of 2)]
[im 1/1]
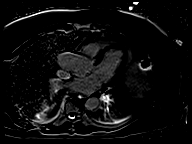

[Series 53: (id)_short_t1 · oblique · 8.0mm · 1.98mm/px · 1 of 27 slices shown]
[im 1/27]
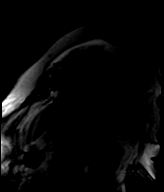

[Series 54: (id)_short_t1_moco · oblique · 8.0mm · 1.98mm/px · 1 of 27 slices shown]
[im 1/27]
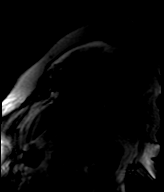

[Series 55: (id)_short_t1_moco_t1 · oblique · 8.0mm · 1.98mm/px · 1 of 3 slices shown (1 of 2)]
[im 1/3]
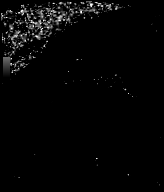

[Series 55: (id)_short_t1_moco_t1 · 1 of 3 slices shown (2 of 2)]
[im 1/3]
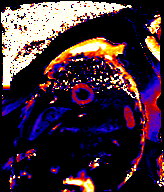

[Series 58: lge short axis · oblique · 8.0mm · 1.58mm/px · 1 of 16 slices shown (1 of 4)]
[im 1/16]
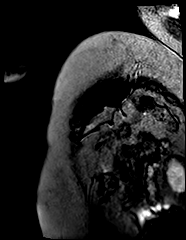

[Series 59: lge short axis · oblique · 8.0mm · 1.58mm/px · 1 of 16 slices shown (2 of 4)]
[im 1/16]
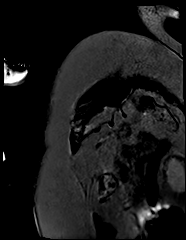

[Series 60: lge short axis · coronal · 8.0mm · 1.58mm/px · 1 of 3 slices shown (3 of 4)]
[im 1/3]
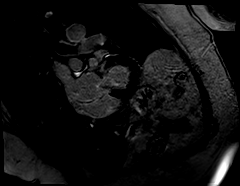

[Series 61: lge short axis · coronal · 8.0mm · 1.58mm/px · 1 of 3 slices shown (4 of 4)]
[im 1/3]
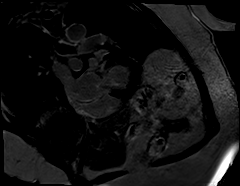

[44 of 47 positions shown; findings below may reference images not displayed]

FINDINGS: 1. Normal left ventricular size with mild concentric hypertrophy and
normal systolic function (LVEF = 63%). There is mild hypokinesis in
the basal anteroseptal, inferoseptal and mid inferior walls. There
are multiple focal areas of midwall late gadolinium enhancement in
the basal anteroseptal, inferoseptal, mid inferoseptal, inferior and
apical inferior walls.

LVEDD: 46 mm

LVESD: 30 mm

LVEDV: 92 ml

LVESV: 34 ml

SV: 58 ml

CO: 4.3 L/min

Myocardial mass: 93 g

2. Normal right ventricular size, thickness and systolic function
(LVEF = 61%). There are no regional wall motion abnormalities.

3.  Normal left atrial size.  Mildly dilated right atrium.

4. Normal size of ascending aorta (38 mm) and pulmonary artery (29
mm). Aortic root is dilated at the sinuses level with maximum
diameter 45 mm.

5. Mild aortic insufficiency. Trivial mitral and mild to moderate
tricuspid regurgitation.

6.  Normal pericardium.  Trivial pericardial effusion.
IMPRESSION: 1. Normal left ventricular size with mild concentric hypertrophy and
normal systolic function (LVEF = 63%). There is mild focal
hypokinesis in the basal anteroseptal, inferoseptal and mid inferior
walls. There are multiple focal areas of midwall late gadolinium
enhancement in the basal anteroseptal, inferoseptal, mid
inferoseptal, inferior and apical inferior walls.

2. Normal right ventricular size, thickness and systolic function
(LVEF = 61%). There are no regional wall motion abnormalities.

3.  Normal left atrial size.  Mildly dilated right atrium.

4. Normal size of ascending aorta (38 mm) and pulmonary artery (29
mm). Aortic root is dilated at the sinuses level with maximum
diameter 45 mm.

5. Mild aortic insufficiency. Trivial mitral and mild to moderate
tricuspid regurgitation.

6.  Normal pericardium.  Trivial pericardial effusion.

These findings are consistent with multifocal myocarditis affecting
5 myocardial segments but overall preserved LVEF. Consider repeating
cardiac MRI in 3-6 months.

## 2020-07-23 MED ORDER — ROSUVASTATIN CALCIUM 40 MG PO TABS: 40.0000 mg | ORAL_TABLET | Freq: Every day | ORAL | 2 refills | Status: DC

## 2020-07-23 MED ORDER — ATORVASTATIN CALCIUM 80 MG PO TABS
80.0000 mg | ORAL_TABLET | Freq: Every day | ORAL | Status: DC
  Filled 2020-07-23: qty 1

## 2020-07-23 MED ORDER — ROSUVASTATIN CALCIUM 20 MG PO TABS
40.0000 mg | ORAL_TABLET | Freq: Every day | ORAL | Status: DC
  Administered 2020-07-23: 40 mg via ORAL
  Filled 2020-07-23: qty 2

## 2020-07-23 MED ORDER — GADOBUTROL 1 MMOL/ML IV SOLN
12.0000 mL | Freq: Once | INTRAVENOUS | Status: AC | PRN
  Administered 2020-07-23: 12 mL via INTRAVENOUS

## 2020-07-23 NOTE — Progress Notes (Addendum)
Progress Note  Patient Name: Ryan Craig Date of Encounter: 07/23/2020  Primary Cardiologist: Candee Furbish, MD   Subjective   No recurrent chest pain overnight. Doing well, no specific complaints   Inpatient Medications    Scheduled Meds: . amLODipine  10 mg Oral Daily  . aspirin EC  81 mg Oral Daily  . quinapril  40 mg Oral Daily  . sodium chloride flush  3 mL Intravenous Q12H   Continuous Infusions: . sodium chloride    . sodium chloride     PRN Meds: sodium chloride, acetaminophen, morphine injection, ondansetron (ZOFRAN) IV, oxyCODONE, sodium chloride flush   Vital Signs    Vitals:   07/22/20 1805 07/22/20 1906 07/23/20 0110 07/23/20 0510  BP: 130/83 136/85 (!) 127/91 131/85  Pulse: 87 88 80 77  Resp:   18 16  Temp:   98 F (36.7 C) 98.1 F (36.7 C)  TempSrc:   Oral Oral  SpO2: 96% 96% 96% 96%  Weight:    93.7 kg  Height:        Intake/Output Summary (Last 24 hours) at 07/23/2020 0742 Last data filed at 07/22/2020 1551 Gross per 24 hour  Intake --  Output 350 ml  Net -350 ml   Filed Weights   07/22/20 1126 07/23/20 0510  Weight: 93 kg 93.7 kg    Physical Exam   General: Well developed, well nourished, NAD Neck: Negative for carotid bruits. No JVD Lungs:Clear to ausculation bilaterally. No wheezes, rales, or rhonchi. Breathing is unlabored. Cardiovascular: RRR with S1 S2. No murmurs, rubs, gallops, or LV heave appreciated. Abdomen: Soft, non-tender, non-distended. No obvious abdominal masses. Extremities: No edema. Radial pulses 2+ bilaterally Neuro: Alert and oriented. No focal deficits. No facial asymmetry. MAE spontaneously. Psych: Responds to questions appropriately with normal affect.    Labs    Chemistry Recent Labs  Lab 07/22/20 1138 07/23/20 0401  NA 137 137  K 4.5 4.4  CL 101 106  CO2 25 21*  GLUCOSE 136* 162*  BUN 11 17  CREATININE 1.02 1.02  CALCIUM 10.0 9.4  GFRNONAA >60 >60  GFRAA >60 >60  ANIONGAP 11 10      Hematology Recent Labs  Lab 07/22/20 1138 07/23/20 0401  WBC 8.3 9.7  RBC 4.97 4.73  HGB 14.3 13.2  HCT 44.0 41.3  MCV 88.5 87.3  MCH 28.8 27.9  MCHC 32.5 32.0  RDW 12.3 12.2  PLT 361 333    Cardiac EnzymesNo results for input(s): TROPONINI in the last 168 hours. No results for input(s): TROPIPOC in the last 168 hours.   BNPNo results for input(s): BNP, PROBNP in the last 168 hours.   DDimer  Recent Labs  Lab 07/22/20 9379  DDIMER 0.39     Radiology    DG Chest 2 View  Result Date: 07/22/2020 CLINICAL DATA:  Chest pain and shortness of breath EXAM: CHEST - 2 VIEW COMPARISON:  02/24/2014 FINDINGS: Normal heart size. Prominent ascending aortic contour. Artifact from EKG leads. There is no edema, consolidation, effusion, or pneumothorax. The ovoid density overlapping the midthoracic spine on the lateral view is ossific density and best attributed to osteophyte. IMPRESSION: Cardiomegaly with tortuous or dilated ascending aorta, unchanged from 2015. No acute finding. Electronically Signed   By: Monte Fantasia M.D.   On: 07/22/2020 12:05   CARDIAC CATHETERIZATION  Result Date: 07/22/2020  Prox RCA lesion is 20% stenosed.  RPAV lesion is 20% stenosed.  Mid Cx lesion is 20% stenosed.  Mid LAD lesion is 20% stenosed.  1. Mild non-obstructive CAD 2. The ascending aorta is not dilated. Recommendations: Will admit to telemetry unit. Will arrange an echo to assess LVEF. Will check a d-dimer as I cannot exclude pulmonary embolism as the cause for his chest pain. He is not tachycardic or dyspneic so lower suspicion for PE. No evidence of dilation of the ascending aorta on angiography so I do not feel that this represents an aortic dissection.   ECHOCARDIOGRAM COMPLETE  Result Date: 07/22/2020    ECHOCARDIOGRAM REPORT   Patient Name:   Ryan Craig Date of Exam: 07/22/2020 Medical Rec #:  [id number]     Height:       67.0 in Accession #:    [id number]    Weight:       205.0 lb Date of  Birth:  [birth year]     BSA:          2.044 m Patient Age:    74 years      BP:           155/88 mmHg Patient Gender: M             HR:           76 bpm. Exam Location:  Inpatient Procedure: 2D Echo Indications:    chest pain 786.50  History:        Patient has prior history of Echocardiogram examinations, most                 recent 11/29/2018. Signs/Symptoms:Chest Pain.  Sonographer:    Johny Chess Referring Phys: [place]  Sonographer Comments: Image acquisition challenging due to respiratory motion. IMPRESSIONS  1. Left ventricular ejection fraction, by estimation, is 60 to 65%. The left ventricle has normal function. The left ventricle has no regional wall motion abnormalities. Left ventricular diastolic parameters are indeterminate.  2. Right ventricular systolic function is normal. The right ventricular size is normal.  3. The mitral valve is normal in structure. No evidence of mitral valve regurgitation.  4. The aortic valve is tricuspid. There is mild thickening of the aortic valve. Aortic valve regurgitation is mild.  5. Aortic dilatation noted. There is mild dilatation of the aortic root, measuring 42 mm.  6. The inferior vena cava is normal in size with greater than 50% respiratory variability, suggesting right atrial pressure of 3 mmHg. Comparison(s): No significant change from prior study. FINDINGS  Left Ventricle: Left ventricular ejection fraction, by estimation, is 60 to 65%. The left ventricle has normal function. The left ventricle has no regional wall motion abnormalities. The left ventricular internal cavity size was normal in size. There is  borderline concentric left ventricular hypertrophy. Left ventricular diastolic parameters are indeterminate. Right Ventricle: The right ventricular size is normal. Right vetricular wall thickness was not assessed. Right ventricular systolic function is normal. Left Atrium: Left atrial size was normal in size. Right Atrium: Right atrial  size was normal in size. Pericardium: There is no evidence of pericardial effusion. Mitral Valve: The mitral valve is normal in structure. No evidence of mitral valve regurgitation. Tricuspid Valve: The tricuspid valve is normal in structure. Tricuspid valve regurgitation is trivial. Aortic Valve: The aortic valve is tricuspid. There is mild thickening of the aortic valve. Aortic valve regurgitation is mild. Pulmonic Valve: The pulmonic valve was not well visualized. Pulmonic valve regurgitation is trivial. Aorta: Aortic dilatation noted. There is mild dilatation of the aortic root, measuring 42 mm. Venous: The  inferior vena cava is normal in size with greater than 50% respiratory variability, suggesting right atrial pressure of 3 mmHg. IAS/Shunts: No atrial level shunt detected by color flow Doppler.  LEFT VENTRICLE PLAX 2D LVIDd:         3.60 cm  Diastology LVIDs:         2.50 cm  LV e' medial:  5.44 cm/s LV PW:         1.00 cm  LV e' lateral: 8.49 cm/s LV IVS:        1.00 cm LVOT diam:     1.80 cm LV SV:         39 LV SV Index:   19 LVOT Area:     2.54 cm  IVC IVC diam: 1.10 cm LEFT ATRIUM             Index       RIGHT ATRIUM           Index LA diam:        3.50 cm 1.71 cm/m  RA Area:     14.50 cm LA Vol (A2C):   26.3 ml 12.87 ml/m RA Volume:   35.60 ml  17.42 ml/m LA Vol (A4C):   37.6 ml 18.40 ml/m LA Biplane Vol: 33.6 ml 16.44 ml/m  AORTIC VALVE LVOT Vmax:   80.90 cm/s LVOT Vmean:  53.700 cm/s LVOT VTI:    0.152 m  AORTA Ao Root diam: 4.20 cm Ao Asc diam:  3.60 cm  SHUNTS Systemic VTI:  0.15 m Systemic Diam: 1.80 cm Gwyndolyn Kaufman MD Electronically signed by Gwyndolyn Kaufman MD Signature Date/Time: 07/22/2020/7:22:11 PM    Final    Telemetry    07/23/20 NSR - Personally Reviewed  ECG    No new tracing as of 07/23/20- Personally Reviewed  Cardiac Studies   Echo 11/2018:  1. The left ventricle has normal systolic function of 13-24%. The cavity  size was normal. There is mildly increased  left ventricular wall  thickness. Echo evidence of impaired diastolic relaxation.  2. The right ventricle has normal systolic function. The cavity was  normal. There is no increase in right ventricular wall thickness. Right  ventricular systolic pressure is mildly elevated with an estimated  pressure of 32.2 mmHg.  3. The mitral valve is normal in structure.  4. The tricuspid valve is normal in structure.  5. The aortic valve is tricuspid There is mild thickening of the aortic  valve. Aortic valve regurgitation is mild by color flow Doppler.  6. The pulmonic valve was normal in structure. Pulmonic valve  regurgitation is mild by color flow Doppler.  7. There is mild dilatation of the aortic root.  8. Normal LV systolic function; mild LVH; mild diastolic dysfunction;  mildly dilated aortic root; sclerotic aortic valve with mild AI; mild TR;  mild pulmonary hypertension.    LHC 07/22/20:   Prox RCA lesion is 20% stenosed.  RPAV lesion is 20% stenosed.  Mid Cx lesion is 20% stenosed.  Mid LAD lesion is 20% stenosed.   1. Mild non-obstructive CAD 2. The ascending aorta is not dilated.   Recommendations: Will admit to telemetry unit. Will arrange an echo to assess LVEF. Will check a d-dimer as I cannot exclude pulmonary embolism as the cause for his chest pain. He is not tachycardic or dyspneic so lower suspicion for PE. No evidence of dilation of the ascending aorta on angiography so I do not feel that this represents an aortic dissection.  Patient Profile     74 y.o. male with history of HTN, HLD who presented to the ED today with c/o chest pain since last night. Onset of severe central chest pain last night with associated shortness of breath  Assessment & Plan    1. NSTEMI: -Cardiology called for STEMI however EKG did not meet STEMI criteria however evaluated by interventional MD and given ongoing chest pain, elevated troponin and abnormal EKG he was taken urgently to  the cath lab which showed mild non-obstructive disease  -D-dimer obtained to r/o PE, 0.39>>low suspicion  -Echo with normal EF and no RWMA, mild AR and mild dilation of the aortic root at 64mm -Continue ASA, statin  2. HTN: -Stable, 140/86>131/85>127/91 -Continue amlodipine, accupril (PTA)  3. Aortic root dilation: -Per echo 07/22/20 measuring 51mm -Continue with good BP control -Follow with serial echo   SignedKathyrn Drown NP-C [facility] Pager: [phone] 07/23/2020, 7:42 AM    I have examined the patient and reviewed assessment and plan and discussed with patient.  Agree with above as stated.    Plan or cardiac MRI to eval for myocarditis.    Negative w/u this far.  No PE, or significant CAD.   Larae Grooms  For questions or updates, please contact   Please consult [email or web address] for contact info under Cardiology/STEMI.

## 2020-07-23 NOTE — Care Management CC44 (Signed)
Condition Code 44 Documentation Completed  Patient Details  Name: Ryan Craig MRN: [id number] Date of Birth: 1946/10/21   Condition Code 44 given:  Yes Patient signature on Condition Code 44 notice:  Yes Documentation of 2 MD's agreement:  Yes Code 44 added to claim:  Yes    Bethena Roys, RN 07/23/2020, 5:30 PM

## 2020-07-23 NOTE — Discharge Summary (Addendum)
Discharge Summary    Patient ID: Ryan Craig MRN: [id number]; DOB: 1945-11-24  Admit date: 07/22/2020 Discharge date: 07/23/2020  Primary Care Provider: [name], FNP  Primary Cardiologist: Candee Furbish, MD   Discharge Diagnoses    Principal Problem:   Chest pain of uncertain etiology Active Problems:   Essential hypertension   Chest pain   HLD (hyperlipidemia)  Diagnostic Studies/Procedures    LHC 07/22/20:   Prox RCA lesion is 20% stenosed.  RPAV lesion is 20% stenosed.  Mid Cx lesion is 20% stenosed.  Mid LAD lesion is 20% stenosed.  1. Mild non-obstructive CAD 2. The ascending aorta is not dilated.   Recommendations: Will admit to telemetry unit. Will arrange an echo to assess LVEF. Will check a d-dimer as I cannot exclude pulmonary embolism as the cause for his chest pain. He is not tachycardic or dyspneic so lower suspicion for PE. No evidence of dilation of the ascending aorta on angiography so I do not feel that this represents an aortic dissection.   History of Present Illness     Ryan Craig is a 74 y.o. male with a history of HTN, HLD who presented to the ED with c/o chest pain which began one day prior to presentation. Pain described as severe with associated SOB. EKG with subtle inferior ST elevation. Troponin 1100 with ongoing chest pain at cardiology assessment. Code STEMI called by the ED staff. However EKG did not meet STEMI criteria but with ongoing pain we elected to proceed with a cardiac cath.   Hospital Course    Cath showed mild non-obstructive disease. D-dimer obtained to r/o PE, 0.39>>low suspicion. Echocardiogram with normal EF and no RWMA, mild AR and mild dilation of the aortic root at 72mm. Due to elevated hsT levels (1166, 1519, 1058, 897) a cardiac MRI performed to evaluate for possible myocarditis. Study has been performed and Dr. Meda Coffee was contacted however due to scheduling, she cannot read until much later today. Discussed with  Dr. Irish Lack who feels comfortable with patient discharge given no further chest discomfort.   Other hospital concerns include:  HTN: -Stable, 140/86>131/85>127/91 -Continue amlodipine, accupril (PTA)  Aortic root dilation: -Per echo 07/22/20 measuring 30mm -Continue with good BP control -Follow with serial echocardiograms   HLD: -LDL found to be 140 -Started on Crestor with plans for repeat lipid panel in 6-8 weeks with LFTs   Consultants: None   The patient was seen and examined by Dr. Irish Lack who feels that he is stable and ready for discharge today. Follow up has been scheduled.   Did the patient have an acute coronary syndrome (MI, NSTEMI, STEMI, etc) this admission?:  No                               Did the patient have a percutaneous coronary intervention (stent / angioplasty)?:  No.   _____________  Discharge Vitals Blood pressure 134/87, pulse 78, temperature 98.2 F (36.8 C), temperature source Oral, resp. rate 18, height 5\' 7"  (1.702 m), weight 93.7 kg, SpO2 94 %.  Filed Weights   07/22/20 1126 07/23/20 0510  Weight: 93 kg 93.7 kg    Labs & Radiologic Studies    CBC Recent Labs    07/22/20 1138 07/23/20 0401  WBC 8.3 9.7  HGB 14.3 13.2  HCT 44.0 41.3  MCV 88.5 87.3  PLT 361 782   Basic Metabolic Panel Recent Labs    07/22/20 1138 07/23/20  0401  NA 137 137  K 4.5 4.4  CL 101 106  CO2 25 21*  GLUCOSE 136* 162*  BUN 11 17  CREATININE 1.02 1.02  CALCIUM 10.0 9.4   Liver Function Tests No results for input(s): AST, ALT, ALKPHOS, BILITOT, PROT, ALBUMIN in the last 72 hours. No results for input(s): LIPASE, AMYLASE in the last 72 hours. High Sensitivity Troponin:   Recent Labs  Lab 07/22/20 1138 07/22/20 1327 07/22/20 1747 07/22/20 2029  TROPONINIHS 1,166* 1,519* 1,058* 897*    BNP Invalid input(s): POCBNP D-Dimer Recent Labs    07/22/20 1822  DDIMER 0.39   Hemoglobin A1C Recent Labs    07/22/20 1327  HGBA1C 5.6   Fasting  Lipid Panel Recent Labs    07/22/20 1327  CHOL 221*  HDL 58  LDLCALC 140*  TRIG 115  CHOLHDL 3.8   Thyroid Function Tests No results for input(s): TSH, T4TOTAL, T3FREE, THYROIDAB in the last 72 hours.  Invalid input(s): FREET3 _____________  DG Chest 2 View  Result Date: 07/22/2020 CLINICAL DATA:  Chest pain and shortness of breath EXAM: CHEST - 2 VIEW COMPARISON:  02/24/2014 FINDINGS: Normal heart size. Prominent ascending aortic contour. Artifact from EKG leads. There is no edema, consolidation, effusion, or pneumothorax. The ovoid density overlapping the midthoracic spine on the lateral view is ossific density and best attributed to osteophyte. IMPRESSION: Cardiomegaly with tortuous or dilated ascending aorta, unchanged from 2015. No acute finding. Electronically Signed   By: Monte Fantasia M.D.   On: 07/22/2020 12:05   CARDIAC CATHETERIZATION  Result Date: 07/22/2020  Prox RCA lesion is 20% stenosed.  RPAV lesion is 20% stenosed.  Mid Cx lesion is 20% stenosed.  Mid LAD lesion is 20% stenosed.  1. Mild non-obstructive CAD 2. The ascending aorta is not dilated. Recommendations: Will admit to telemetry unit. Will arrange an echo to assess LVEF. Will check a d-dimer as I cannot exclude pulmonary embolism as the cause for his chest pain. He is not tachycardic or dyspneic so lower suspicion for PE. No evidence of dilation of the ascending aorta on angiography so I do not feel that this represents an aortic dissection.   ECHOCARDIOGRAM COMPLETE  Result Date: 07/22/2020    ECHOCARDIOGRAM REPORT   Patient Name:   Ryan Craig Date of Exam: 07/22/2020 Medical Rec #:  [id number]     Height:       67.0 in Accession #:    [id number]    Weight:       205.0 lb Date of Birth:  11/27/1945     BSA:          2.044 m Patient Age:    74 years      BP:           155/88 mmHg Patient Gender: M             HR:           76 bpm. Exam Location:  Inpatient Procedure: 2D Echo Indications:    chest pain 786.50   History:        Patient has prior history of Echocardiogram examinations, most                 recent 11/29/2018. Signs/Symptoms:Chest Pain.  Sonographer:    Johny Chess Referring Phys: [place]  Sonographer Comments: Image acquisition challenging due to respiratory motion. IMPRESSIONS  1. Left ventricular ejection fraction, by estimation, is 60 to 65%. The left ventricle has normal  function. The left ventricle has no regional wall motion abnormalities. Left ventricular diastolic parameters are indeterminate.  2. Right ventricular systolic function is normal. The right ventricular size is normal.  3. The mitral valve is normal in structure. No evidence of mitral valve regurgitation.  4. The aortic valve is tricuspid. There is mild thickening of the aortic valve. Aortic valve regurgitation is mild.  5. Aortic dilatation noted. There is mild dilatation of the aortic root, measuring 42 mm.  6. The inferior vena cava is normal in size with greater than 50% respiratory variability, suggesting right atrial pressure of 3 mmHg. Comparison(s): No significant change from prior study. FINDINGS  Left Ventricle: Left ventricular ejection fraction, by estimation, is 60 to 65%. The left ventricle has normal function. The left ventricle has no regional wall motion abnormalities. The left ventricular internal cavity size was normal in size. There is  borderline concentric left ventricular hypertrophy. Left ventricular diastolic parameters are indeterminate. Right Ventricle: The right ventricular size is normal. Right vetricular wall thickness was not assessed. Right ventricular systolic function is normal. Left Atrium: Left atrial size was normal in size. Right Atrium: Right atrial size was normal in size. Pericardium: There is no evidence of pericardial effusion. Mitral Valve: The mitral valve is normal in structure. No evidence of mitral valve regurgitation. Tricuspid Valve: The tricuspid valve is normal in  structure. Tricuspid valve regurgitation is trivial. Aortic Valve: The aortic valve is tricuspid. There is mild thickening of the aortic valve. Aortic valve regurgitation is mild. Pulmonic Valve: The pulmonic valve was not well visualized. Pulmonic valve regurgitation is trivial. Aorta: Aortic dilatation noted. There is mild dilatation of the aortic root, measuring 42 mm. Venous: The inferior vena cava is normal in size with greater than 50% respiratory variability, suggesting right atrial pressure of 3 mmHg. IAS/Shunts: No atrial level shunt detected by color flow Doppler.  LEFT VENTRICLE PLAX 2D LVIDd:         3.60 cm  Diastology LVIDs:         2.50 cm  LV e' medial:  5.44 cm/s LV PW:         1.00 cm  LV e' lateral: 8.49 cm/s LV IVS:        1.00 cm LVOT diam:     1.80 cm LV SV:         39 LV SV Index:   19 LVOT Area:     2.54 cm  IVC IVC diam: 1.10 cm LEFT ATRIUM             Index       RIGHT ATRIUM           Index LA diam:        3.50 cm 1.71 cm/m  RA Area:     14.50 cm LA Vol (A2C):   26.3 ml 12.87 ml/m RA Volume:   35.60 ml  17.42 ml/m LA Vol (A4C):   37.6 ml 18.40 ml/m LA Biplane Vol: 33.6 ml 16.44 ml/m  AORTIC VALVE LVOT Vmax:   80.90 cm/s LVOT Vmean:  53.700 cm/s LVOT VTI:    0.152 m  AORTA Ao Root diam: 4.20 cm Ao Asc diam:  3.60 cm  SHUNTS Systemic VTI:  0.15 m Systemic Diam: 1.80 cm Gwyndolyn Kaufman MD Electronically signed by Gwyndolyn Kaufman MD Signature Date/Time: 07/22/2020/7:22:11 PM    Final    Disposition   Pt is being discharged home today in good condition.  Follow-up Plans & Appointments  Follow-up Information    Burtis Junes, NP Follow up on 07/28/2020.   Specialties: Nurse Practitioner, Interventional Cardiology, Cardiology, Radiology Why: at 11:45am  Contact information: [place]. [address] [place] [address] [phone]              Discharge Instructions    Call MD for:  difficulty breathing, headache or visual disturbances   Complete by:  As directed    Call MD for:  extreme fatigue   Complete by: As directed    Call MD for:  hives   Complete by: As directed    Call MD for:  persistant dizziness or light-headedness   Complete by: As directed    Call MD for:  persistant nausea and vomiting   Complete by: As directed    Call MD for:  redness, tenderness, or signs of infection (pain, swelling, redness, odor or green/yellow discharge around incision site)   Complete by: As directed    Call MD for:  severe uncontrolled pain   Complete by: As directed    Call MD for:  temperature >100.4   Complete by: As directed    Diet - low sodium heart healthy   Complete by: As directed    Discharge instructions   Complete by: As directed    No driving for 2 days. No lifting over 5 lbs for 1 week. No sexual activity for 1 week. Keep procedure site clean & dry. If you notice increased pain, swelling, bleeding or pus, call/return!  You may shower, but no soaking baths/hot tubs/pools for 1 week.   Increase activity slowly   Complete by: As directed      Discharge Medications   Allergies as of 07/23/2020      Reactions   Contrast Media [iodinated Diagnostic Agents] Anaphylaxis   Latex Rash      Medication List    TAKE these medications   acetaminophen 500 MG tablet Commonly known as: TYLENOL Take 500 mg by mouth every 6 (six) hours as needed.   amLODipine 10 MG tablet Commonly known as: NORVASC TAKE 1 TABLET BY MOUTH  DAILY   aspirin EC 81 MG tablet Take 81 mg by mouth daily.   HYDROcodone-acetaminophen 5-325 MG tablet Commonly known as: Norco Take 1-2 tablets by mouth every 6 (six) hours as needed for moderate pain. One to two tabs every 4-6 hours for pain   quinapril 40 MG tablet Commonly known as: ACCUPRIL TAKE 1 TABLET BY MOUTH  DAILY   rosuvastatin 40 MG tablet Commonly known as: CRESTOR Take 1 tablet (40 mg total) by mouth daily. Start taking on: July 24, 2020   triamcinolone cream 0.1 % Commonly known as:  KENALOG APPLY TOPICALLY A THIN LAYER TO THE AFFECTED AREA(S) TWICE A DAY What changed: See the new instructions.   VITAMIN D-3 PO Take 2 tablets by mouth daily.       Outstanding Labs/Studies   None   Duration of Discharge Encounter   Greater than 30 minutes including physician time.  Signed, Kathyrn Drown, NP 07/23/2020, 4:32 PM  I have examined the patient and reviewed assessment and plan and discussed with patient.  Agree with above as stated.    Cardiac MRI to eval for myocarditis.  Await final read.  He is not having heart failure.  Discussed with reader and since there will be a delay in reading, will allow patietn to go home and f/u on result.  Patient wants to go home ASAP.  Negative w/u this far.  No PE, or significant CAD. Normal EF by echo.  Tele stable.   Larae Grooms  Addendum:  MRI did show multiple foci of myocarditis.  LVEF 63%.  No sign of LV dysfunction. He has f/u next week in the office.  Will add metoprolol 25 mg BID and call in prescription for the patient. I discussed the results with the patient by phone and called in the prescription to [facility] [address].   Jettie Booze, MD

## 2020-07-23 NOTE — Progress Notes (Signed)
[phone] CARDIAC REHAB PHASE I   PRE:  Rate/Rhythm: 89 SR  BP:  Supine: 140/86  Sitting:   Standing:    SaO2: 99%RA  MODE:  Ambulation: 470 ft   POST:  Rate/Rhythm: 114 ST  BP:  Supine:   Sitting: 134/87  Standing:    SaO2: 93%RA 9150-4136 Pt walked 470 ft on RA with steady gait and no CP. Tolerated well . HR up with activity. Discussed heart healthy food choices and gave walking instructions for ex. Did not discuss CRP 2 as cath with nonobstructive disease and cardiology PA stated no MI.  Pt stated he and wife will begin walking for ex.    Graylon Good, RN BSN  07/23/2020 8:55 AM

## 2020-07-23 NOTE — Care Management Obs Status (Signed)
[place] NOTIFICATION   Patient Details  Name: Ryan Craig MRN: [id number] Date of Birth: 1946-08-12   Medicare Observation Status Notification Given:  Yes    Bethena Roys, RN 07/23/2020, 5:14 PM

## 2020-07-25 NOTE — Progress Notes (Signed)
CARDIOLOGY OFFICE NOTE  Date:  07/28/2020    Ryan Craig Date of Birth: 06/25/1946 Medical Record #[id number]  PCP:  [name], FNP  Cardiologist:  [facility]  Chief Complaint  Patient presents with  . Follow-up  . Hospitalization Follow-up    Seen for Dr. Marlou Porch    History of Present Illness: Ryan Craig is a 74 y.o. male who presents today for a post hospital visit. Seen for Dr. Marlou Porch.   He has known HTN and HLD.   Presented last week to the ER with chest pain - had cath last week - non obstructive CAD noted. Cardiac MRI was done to rule out myocarditis - this was + - beta blocker has been started. His aorta is dilated. EF is preserved.   Comes in today. Here with his wife. They have lots of questions. His chest pain is gone. He is not short of breath. He wants to drive. He started itching immediately after discharge - has had some hives - he has a dye allergy and was pre-medicated. He is wondering if this is from the new medicines - beta blocker and statin. Has not tried any Benadryl. He is due for his 3rd COVID booster shot - asking if he should proceed.   Past Medical History:  Diagnosis Date  . Anemia    no on any meds  . Arthritis   . Chronic back pain    deteriorating  . Hyperlipidemia    was on Lipitor but was taken off 3-70yrs ago by medical md bc running liver enzymes up  . Hypertension    takes Amlodipine and Quinapril daily  . Joint pain   . Joint swelling     Past Surgical History:  Procedure Laterality Date  . arthroscopic knee surgery Right   . COLONOSCOPY    . INGUINAL HERNIA REPAIR Bilateral    x 4  . JOINT REPLACEMENT Right 6 yrs ago   knee  . KNEE ARTHROSCOPY Left 02/24/2014   Procedure: LEFT KNEE ARTHROSCOPY WITH PARTIAL MEDIAL MENISCECTOMY;  Surgeon: Mcarthur Rossetti, MD;  Location: [place];  Service: Orthopedics;  Laterality: Left;  . LEFT HEART CATH AND CORONARY ANGIOGRAPHY N/A 07/22/2020   Procedure: LEFT HEART CATH AND  CORONARY ANGIOGRAPHY;  Surgeon: Burnell Blanks, MD;  Location: [place] [facility];  Service: Cardiovascular;  Laterality: N/A;  . TOTAL KNEE ARTHROPLASTY Left 12/22/2014   Procedure: LEFT TOTAL KNEE ARTHROPLASTY;  Surgeon: Mcarthur Rossetti, MD;  Location: [place];  Service: Orthopedics;  Laterality: Left;     Medications: Current Meds  Medication Sig  . acetaminophen (TYLENOL) 500 MG tablet Take 500 mg by mouth every 6 (six) hours as needed.  Marland Kitchen amLODipine (NORVASC) 10 MG tablet TAKE 1 TABLET BY MOUTH  DAILY  . aspirin EC 81 MG tablet Take 81 mg by mouth daily.  Marland Kitchen BLACK CURRANT SEED OIL PO Take by mouth.  . Cholecalciferol (VITAMIN D-3 PO) Take 2 tablets by mouth daily.   . metoprolol succinate (TOPROL-XL) 25 MG 24 hr tablet Take 25 mg by mouth in the morning and at bedtime.  . quinapril (ACCUPRIL) 40 MG tablet TAKE 1 TABLET BY MOUTH  DAILY  . rosuvastatin (CRESTOR) 40 MG tablet Take 1 tablet (40 mg total) by mouth daily.  Marland Kitchen triamcinolone cream (KENALOG) 0.1 % APPLY TOPICALLY A THIN LAYER TO THE AFFECTED AREA(S) TWICE A DAY     Allergies: Allergies  Allergen Reactions  . Contrast Media [Iodinated Diagnostic Agents]  Anaphylaxis  . Latex Rash    Social History: The patient  reports that he has quit smoking. He has quit using smokeless tobacco. He reports that he does not drink alcohol and does not use drugs.   Family History: The patient's family history includes Cancer in his cousin, maternal aunt, and paternal aunt; Heart attack in his mother.   Review of Systems: Please see the history of present illness.   All other systems are reviewed and negative.   Physical Exam: VS:  BP 110/70   Pulse 66   Ht 5\' 7"  (1.702 m)   Wt 211 lb 3.2 oz (95.8 kg)   SpO2 98%   BMI 33.08 kg/m  .  BMI Body mass index is 33.08 kg/m.  Wt Readings from Last 3 Encounters:  07/28/20 211 lb 3.2 oz (95.8 kg)  07/23/20 206 lb 9.6 oz (93.7 kg)  05/04/20 213 lb 9.6 oz (96.9 kg)     General: Pleasant. Alert and in no acute distress.   Cardiac: Regular rate and rhythm. No murmurs, rubs, or gallops. No edema.  Respiratory:  Lungs are clear to auscultation bilaterally with normal work of breathing.  GI: Soft and nontender.  MS: No deformity or atrophy. Gait and ROM intact.  Skin: Warm and dry. Color is normal.  Neuro:  Strength and sensation are intact and no gross focal deficits noted.  Psych: Alert, appropriate and with normal affect.   LABORATORY DATA:  EKG:  EKG is not ordered today.    Lab Results  Component Value Date   WBC 9.7 07/23/2020   HGB 13.2 07/23/2020   HCT 41.3 07/23/2020   PLT 333 07/23/2020   GLUCOSE 162 (H) 07/23/2020   CHOL 221 (H) 07/22/2020   TRIG 115 07/22/2020   HDL 58 07/22/2020   LDLCALC 140 (H) 07/22/2020   ALT 12 05/04/2020   AST 13 05/04/2020   NA 137 07/23/2020   K 4.4 07/23/2020   CL 106 07/23/2020   CREATININE 1.02 07/23/2020   BUN 17 07/23/2020   CO2 21 (L) 07/23/2020   TSH 1.010 08/05/2019   INR 1.0 07/22/2020   HGBA1C 5.6 07/22/2020   MICROALBUR 10 11/04/2019     BNP (last 3 results) No results for input(s): BNP in the last 8760 hours.  ProBNP (last 3 results) No results for input(s): PROBNP in the last 8760 hours.   Other Studies Reviewed Today:  CARDIAC MRI IMPRESSION: 1. Normal left ventricular size with mild concentric hypertrophy and normal systolic function (LVEF = 63%). There is mild focal hypokinesis in the basal anteroseptal, inferoseptal and mid inferior walls. There are multiple focal areas of midwall late gadolinium enhancement in the basal anteroseptal, inferoseptal, mid inferoseptal, inferior and apical inferior walls.  2. Normal right ventricular size, thickness and systolic function (LVEF = 61%). There are no regional wall motion abnormalities.  3.  Normal left atrial size.  Mildly dilated right atrium.  4. Normal size of ascending aorta (38 mm) and pulmonary artery (29 mm).  Aortic root is dilated at the sinuses level with maximum diameter 45 mm.  5. Mild aortic insufficiency. Trivial mitral and mild to moderate tricuspid regurgitation.  6.  Normal pericardium.  Trivial pericardial effusion.  These findings are consistent with multifocal myocarditis affecting 5 myocardial segments but overall preserved LVEF. Consider repeating cardiac MRI in 3-6 months.  Electronically Signed   By: Ena Dawley   On: 07/23/2020 18:40    Wall 07/22/20:   Prox RCA lesion is  20% stenosed.  RPAV lesion is 20% stenosed.  Mid Cx lesion is 20% stenosed.  Mid LAD lesion is 20% stenosed.  1. Mild non-obstructive CAD 2. The ascending aorta is not dilated.   Recommendations: Will admit to telemetry unit. Will arrange an echo to assess LVEF. Will check a d-dimer as I cannot exclude pulmonary embolism as the cause for his chest pain. He is not tachycardic or dyspneic so lower suspicion for PE. No evidence of dilation of the ascending aorta on angiography so I do not feel that this represents an aortic dissection.   Assessment/Plan:  1. Myocarditis - per cardiac MRI - has had repeat study recommended in 3 to 6 months. He is currently doing well - has no chest pain or suggestion of heart failure. Limit strenuous activity. Ok to drive.   2. Chest pain - + troponin - very mild CAD - see #1.   3. HLD - now on statin  4. Rash/hives - I suspect this is from the contrast - ok to use Benadryl - I think this will resolve.   5. Dilated aorta - this will need to be followed - precautions given. Would give consideration for referral to [facility] - can discuss further on return visit. He has good BP control and is on beta blocker therapy.   6. HTN - BP is at goal - I have asked him to monitor at home.   7. COVID 19 - would hold on 3rd vaccine for now given the myocarditis noted on MRI.   Current medicines are reviewed with the patient today.  The patient does not have  concerns regarding medicines other than what has been noted above.  The following changes have been made:  See above.  Labs/ tests ordered today include:   No orders of the defined types were placed in this encounter.    Disposition:   FU with Dr. Marlou Porch in one month with fasting labs.      Patient is agreeable to this plan and will call if any problems develop in the interim.   SignedTruitt Merle, NP  07/28/2020 12:49 PM  [facility] [address] [place], [place]  [address] Phone: [phone] Fax: [phone]

## 2020-07-28 ENCOUNTER — Other Ambulatory Visit: Payer: Self-pay

## 2020-07-28 ENCOUNTER — Ambulatory Visit (INDEPENDENT_AMBULATORY_CARE_PROVIDER_SITE_OTHER): Payer: Medicare Other | Admitting: Nurse Practitioner

## 2020-07-28 ENCOUNTER — Encounter: Payer: Self-pay | Admitting: Nurse Practitioner

## 2020-07-28 VITALS — BP 110/70 | HR 66 | Ht 67.0 in | Wt 211.2 lb

## 2020-07-28 DIAGNOSIS — I2510 Atherosclerotic heart disease of native coronary artery without angina pectoris: Secondary | ICD-10-CM | POA: Diagnosis not present

## 2020-07-28 DIAGNOSIS — Z9889 Other specified postprocedural states: Secondary | ICD-10-CM

## 2020-07-28 DIAGNOSIS — E782 Mixed hyperlipidemia: Secondary | ICD-10-CM | POA: Diagnosis not present

## 2020-07-28 DIAGNOSIS — I409 Acute myocarditis, unspecified: Secondary | ICD-10-CM | POA: Diagnosis not present

## 2020-07-28 DIAGNOSIS — I10 Essential (primary) hypertension: Secondary | ICD-10-CM | POA: Diagnosis not present

## 2020-07-28 NOTE — Patient Instructions (Addendum)
After Visit Summary:  We will be checking the following labs today - NONE  Fasting labs on return visit   Medication Instructions:    Continue with your current medicines.   Ok to use Benadryl at night for your itching  Ok to take plain Claritin once a day in the mornings    If you need a refill on your cardiac medications before your next appointment, please call your pharmacy.     Testing/Procedures To Be Arranged:  N/A  Follow-Up:   See Dr. Marlou Porch in one month with fasting labs.    At [facility], you and your health needs are our priority.  As part of our continuing mission to provide you with exceptional heart care, we have created designated Provider Care Teams.  These Care Teams include your primary Cardiologist (physician) and Advanced Practice Providers (APPs -  Physician Assistants and Nurse Practitioners) who all work together to provide you with the care you need, when you need it.  Special Instructions:  . Stay safe, wash your hands for at least 20 seconds and wear a mask when needed.  . It was good to talk with you today.  . Try to check some blood pressures for [place]. . Less salt/fat/ice cream in your diet . No strenuous activities for the next month                               Ascending Aortic Aneurysm/ Thoracic Aortic Aneurysm   Recent studies have raised concern that fluoroquinolone antibiotics could be associated with an increased risk of aortic aneurysm or aortic dissection. You should avoid use of Cipro and other associated antibiotics (flouroquinolone antibiotics )  It is  best to avoid activities that cause grunting or straining (medically referred to as a "valsalva maneuver"). This happens when a person bears down against a closed throat to increase the strength of arm or abdominal muscles. There's often a tendency to do this when lifting heavy weights, doing sit-ups, push-ups or chin-ups, etc., but it may be harmful.     An aneurysm is a  bulge in an artery. It happens when blood pushes up against a weakened or damaged artery wall. A thoracic aortic aneurysm is an aneurysm that occurs in the first part of the aorta, between the heart and the diaphragm. The aorta is the main artery of the body. It supplies blood from the heart to the rest of the body. Some aneurysms may not cause symptoms or problems. However, the major concern with a thoracic aortic aneurysm is that it can enlarge and burst (rupture), or blood can flow between the layers of the wall of the aorta through a tear (aorticdissection). Both of these conditions can cause bleeding inside the body and can be life-threatening if they are not diagnosed and treated right away. What are the causes? The exact cause of this condition is not known. What increases the risk? The following factors may make you more likely to develop this condition:  Being age 53 or older.  Having a hardening of the arteries caused by the buildup of fat and other substances in the lining of a blood vessel (arteriosclerosis).  Having inflammation of the walls of an artery (arteritis).  Having a genetic disease that weakens the body's connective tissue, such as Marfan syndrome.  Having an injury or trauma to the aorta.  Having an infection that is caused by bacteria, such as syphilis or  staphylococcus, in the wall of the aorta (infectious aortitis).  Having high blood pressure (hypertension).  Being male.  Being white (Caucasian).  Having high cholesterol.  Having a family history of aneurysms.  Using tobacco.  Having chronic obstructive pulmonary disease (COPD). What are the signs or symptoms? Symptoms of this condition vary depending on the size and rate of growth of the aneurysm. Most grow slowly and do not cause any symptoms. When symptoms do occur, they may include:  Pain in the chest, back, sides, or abdomen. The pain may vary in intensity. A sudden onset of severe pain may  indicate that the aneurysm has ruptured.  Hoarseness.  Cough.  Shortness of breath.  Swallowing problems.  Swelling in the face, arms, or legs.  Fever.  Unexplained weight loss. How is this diagnosed? This condition may be diagnosed with:  An ultrasound.  X-rays.  A CT scan.  An MRI.  Tests to check the arteries for damage or blockages (angiogram). Most unruptured thoracic aortic aneurysms cause no symptoms, so they are often found during exams for other conditions. How is this treated? Treatment for this condition depends on:  The size of the aneurysm.  How fast the aneurysm is growing.  Your age.  Risk factors for rupture. Aneurysms that are smaller than 2.2 inches (5.5 cm) may be managed by using medicines to control blood pressure, manage pain, or fight infection. You may need regular monitoring to see if the aneurysm is getting bigger. Your health care provider may recommend that you have an ultrasound every year or every 6 months. How often you need to have an ultrasound depends on the size of the aneurysm, how fast it is growing, and whether you have a family history of aneurysms. Surgical repair may be needed if your aneurysm is larger than 2.2 inches or if it is growing quickly. Follow these instructions at home: Eating and drinking   Eat a healthy diet. Your health care provider may recommend that you:  Lower your salt (sodium) intake. In some people, too much salt can raise blood pressure and increase the risk of thoracic aortic aneurysm.  Avoid foods that are high in saturated fat and cholesterol, such as red meat and dairy.  Eat a diet that is low in sugar.  Increase your fiber intake by including whole grains, vegetables, and fruits in your diet. Eating these foods may help to lower blood pressure.  Limit or avoid alcohol as recommended by your health care provider. Lifestyle   Follow instructions from your health care provider about healthy  lifestyle habits. Your health care provider may recommend that you:  Do not use any products that contain nicotine or tobacco, such as cigarettes and e-cigarettes. If you need help quitting, ask your health care provider.  Keep your blood pressure within normal limits. The target limit for most people is below 120/80. Check your blood pressure regularly. If it is high, ask your health care provider about ways that you can control it.  Keep your blood sugar (glucose) level and cholesterol levels within normal limits. Target limits for most people are:  Blood glucose level: Less than 100 mg/dL.  Total cholesterol level: Less than 200 mg/dL.  Maintain a healthy weight. Activity   Stay physically active and exercise regularly. Talk with your health care provider about how often you should exercise and ask which types of exercise are safe for you.  Avoid heavy lifting and activities that take a lot of effort (are strenuous). Ask  your health care provider what activities are safe for you. General instructions   Keep all follow-up visits as told by your health care provider. This is important.  Talk with your health care provider about regular screenings to see if the aneurysm is getting bigger.  Take over-the-counter and prescription medicines only as told by your health care provider. Contact a health care provider if:  You have discomfort in your upper back, neck, or abdomen.  You have trouble swallowing.  You have a cough or hoarseness.  You have a family history of aneurysms.  You have unexplained weight loss. Get help right away if:  You have sudden, severe pain in your upper back and abdomen. This pain may move into your chest and arms.  You have shortness of breath.  You have a fever. This information is not intended to replace advice given to you by your health care provider. Make sure you discuss any questions you have with your health care provider. Document Released:  10/09/2005 Document Revised: 07/21/2016 Document Reviewed: 07/21/2016 Elsevier Interactive Patient Education  2017 [place].   Aortic Dissection An aortic dissection happens when there is a tear in the main blood vessel of the body (aorta). The aorta comes out of the heart, curves around, and then goes down the chest (thoracic aorta) and into the abdomen (abdominal aorta) to supply arteries with blood. The wall of the aorta has inner and outer layers. Aortic dissection occurs most often in the thoracic aorta. As the tear widens and blood flows through it, the aorta becomes "double-barreled." This means that one part of the aorta continues to carry blood to the body, but blood also flows into the tear, between the layers of the aorta. The torn part of the aorta fills with blood and swells up. This can reduce blood flow through the part of the aorta that is still supplying blood to the body. Aortic dissection is a medical emergency. What are the causes? An aortic dissection is commonly caused by weakening of the artery wall due to high blood pressure. Other causes may include:  An injury, such as from a car crash.  Birth defects that affect the heart (congenital heart defects).  Thickening of the artery walls. In some cases, the cause is not known. What increases the risk? The following factors may make you more likely to develop this condition:  Having certain medical conditions, such as:  High blood pressure (hypertension).  Hardening and narrowing of the arteries (atherosclerosis).  A genetic disorder that affects the connective tissue, such as Marfan syndrome or Ehlers-Danlos syndrome.  A condition that causes inflammation of blood vessels, such as giant cell arteritis.  Having a chest injury.  Having surgery on the aorta.  Being born with a congenital heart defect.  Being male.  Being older than age 30.  Using cocaine.  Smoking.  Lifting heavy weights or doing other  types of high-intensity resistance training. What are the signs or symptoms? Signs and symptoms of aortic dissection start suddenly. The most common symptoms are:  Severe chest pain that may feel like tearing, stabbing, or sharp pain.  Severe pain that spreads (radiates) to the back, neck, jaw, or abdomen. Other symptoms may include:  Trouble breathing.  Dizziness or fainting.  Sudden weakness on one side of the body.  Nausea or vomiting.  Trouble swallowing.  Coughing up blood.  Vomiting blood.  Clammy skin. How is this diagnosed? This condition may be diagnosed based on:  Your symptoms.  A physical exam. This may include:  Listening for abnormal blood flow sounds (murmurs) in your chest or abdomen.  Checking your pulse in your arms and legs.  Checking your blood pressure to see whether it is low or whether there is a difference between the measurements in your right and left arm.  Electrocardiogram (ECG). This test measures the electrical activity in your heart.  Chest X-ray.  CT scan.  MRI.  Aortic angiogram. This test involves injecting dye to make it easier to see your blood vessels clearly.  Echocardiogram to study your heart using sound waves.  Blood tests. How is this treated? It is important to treat an aortic dissection as quickly as possible. Treatment may start as soon as your health care provider thinks that you have aortic dissection. Treatment depends on the location and severity of your dissection and your overall health. Treatment may include:  Medicines to lower your blood pressure.  Surgery to repair the dissected part of your aorta with artificial material (syntheticgraft).  A medical procedure to insert a stent-graft into the aorta (endovascular procedure). During this procedure, a long, thin tube (stent) is inserted into an artery near the groin (femoral artery) and moved up to the damaged part of the aorta. Then, the stent is opened to  help improve blood flow and prevent future dissection. Follow these instructions at home: Activity   Avoid activities that could injure your chest or your abdomen. Ask your health care provider what activities are safe for you.  After you have recovered, try to stay active. Ask your health care provider what activities are safe for you after recovery.  Do not lift anything that is heavier than 10 lb (4.5 kg) until your health care provider approves.  Do not drive or use heavy machinery while taking prescription pain medicine. Eating and drinking   Eat a heart-healthy diet, which includes lots of fresh fruits and vegetables, low-fat (lean) protein, and whole grains.  Check ingredients and nutrition facts on packaged foods and beverages, and avoid foods with high amounts of:  Salt (sodium).  Saturated fats (like red meat).  Trans fats (like fried food). General instructions   Take over-the-counter and prescription medicines only as told by your health care provider.  Work with your health care provider to manage your blood pressure.  Talk with your health care provider about how to manage stress.  Do not use any products that contain nicotine or tobacco, such as cigarettes and e-cigarettes. If you need help quitting, ask your health care provider.  Keep all follow-up visits as told by your health care provider. This is important. Get help right away if:  You develop any symptoms of aortic dissection after treatment, including severe pain in your chest, back, or abdomen.  You have a pain in your abdomen.  You have trouble breathing or you develop a cough.  You faint.  You develop a racing heartbeat. These symptoms may represent a serious problem that is an emergency. Do not wait to see if the symptoms will go away. Get medical help right away. Call your local emergency services (911 in the [place] Do not drive yourself to the hospital. Summary  An aortic dissection happens  when there is a tear in the main blood vessel of the body (aorta). It is a medical emergency.  The most common symptom is severe pain in the chest that spreads (radiates) to the back, neck, jaw, or abdomen.  It is important to treat an aortic  dissection as quickly as possible. Treatment typically includes surgery and medicines. This information is not intended to replace advice given to you by your health care provider. Make sure you discuss any questions you have with your health care provider. Document Released: 01/16/2008 Document Revised: 08/28/2016 Document Reviewed: 08/28/2016 Elsevier Interactive Patient Education  2017 [employer].     Call the [place] office at [phone] if you have any questions, problems or concerns.

## 2020-08-10 ENCOUNTER — Other Ambulatory Visit: Payer: Self-pay | Admitting: Nurse Practitioner

## 2020-08-27 ENCOUNTER — Ambulatory Visit (INDEPENDENT_AMBULATORY_CARE_PROVIDER_SITE_OTHER): Payer: Medicare Other | Admitting: Cardiology

## 2020-08-27 ENCOUNTER — Other Ambulatory Visit: Payer: Self-pay

## 2020-08-27 ENCOUNTER — Encounter: Payer: Self-pay | Admitting: Cardiology

## 2020-08-27 VITALS — BP 112/60 | HR 68 | Ht 67.0 in | Wt 213.0 lb

## 2020-08-27 DIAGNOSIS — I409 Acute myocarditis, unspecified: Secondary | ICD-10-CM | POA: Diagnosis not present

## 2020-08-27 DIAGNOSIS — I452 Bifascicular block: Secondary | ICD-10-CM

## 2020-08-27 DIAGNOSIS — I2510 Atherosclerotic heart disease of native coronary artery without angina pectoris: Secondary | ICD-10-CM | POA: Diagnosis not present

## 2020-08-27 MED ORDER — METOPROLOL SUCCINATE ER 25 MG PO TB24: 25.0000 mg | ORAL_TABLET | Freq: Every day | ORAL | Status: DC

## 2020-08-27 MED ORDER — METOPROLOL SUCCINATE ER 25 MG PO TB24: 25.0000 mg | ORAL_TABLET | Freq: Every day | ORAL | 3 refills | Status: DC

## 2020-08-27 NOTE — Progress Notes (Signed)
Cardiology Office Note:    Date:  08/27/2020   ID:  Ryan Craig, DOB 08-Oct-1946, MRN [id number]  PCP:  [name], FNP  [facility] Cardiologist:  Candee Furbish, MD  [facility] Electrophysiologist:  None   Referring MD: [name], FNP     History of Present Illness:    Ryan Craig is a 74 y.o. male here for hospital follow-up, last seen by Truitt Merle on 07/28/2020, note reviewed.  He recently presented to the emergency room with chest pain had heart cath nonobstructive CAD cardiac MRI demonstrated myocarditis.  Beta-blockers started.  Aorta was also dilated.  EF preserved.  Chest pain had resolved.  Had some itching hives, dye allergy was premedicated.  Wondering if any of these symptoms were from beta-blocker or statin.  Had not tried Benadryl at the time.  Is asking about Covid booster as well back then.  Past Medical History:  Diagnosis Date  . Anemia    no on any meds  . Arthritis   . Chronic back pain    deteriorating  . Hyperlipidemia    was on Lipitor but was taken off 3-38yrs ago by medical md bc running liver enzymes up  . Hypertension    takes Amlodipine and Quinapril daily  . Joint pain   . Joint swelling     Past Surgical History:  Procedure Laterality Date  . arthroscopic knee surgery Right   . COLONOSCOPY    . INGUINAL HERNIA REPAIR Bilateral    x 4  . JOINT REPLACEMENT Right 6 yrs ago   knee  . KNEE ARTHROSCOPY Left 02/24/2014   Procedure: LEFT KNEE ARTHROSCOPY WITH PARTIAL MEDIAL MENISCECTOMY;  Surgeon: Mcarthur Rossetti, MD;  Location: [place];  Service: Orthopedics;  Laterality: Left;  . LEFT HEART CATH AND CORONARY ANGIOGRAPHY N/A 07/22/2020   Procedure: LEFT HEART CATH AND CORONARY ANGIOGRAPHY;  Surgeon: Burnell Blanks, MD;  Location: [place] [facility];  Service: Cardiovascular;  Laterality: N/A;  . TOTAL KNEE ARTHROPLASTY Left 12/22/2014   Procedure: LEFT TOTAL KNEE ARTHROPLASTY;  Surgeon: Mcarthur Rossetti, MD;   Location: [place];  Service: Orthopedics;  Laterality: Left;    Current Medications: Current Meds  Medication Sig  . acetaminophen (TYLENOL) 500 MG tablet Take 500 mg by mouth every 6 (six) hours as needed.  Marland Kitchen amLODipine (NORVASC) 10 MG tablet TAKE 1 TABLET BY MOUTH  DAILY  . aspirin EC 81 MG tablet Take 81 mg by mouth daily.  Marland Kitchen BLACK CURRANT SEED OIL PO Take by mouth.  . Cholecalciferol (VITAMIN D-3 PO) Take 2 tablets by mouth daily.   . metoprolol succinate (TOPROL-XL) 25 MG 24 hr tablet Take 1 tablet (25 mg total) by mouth daily.  . quinapril (ACCUPRIL) 40 MG tablet TAKE 1 TABLET BY MOUTH  DAILY  . rosuvastatin (CRESTOR) 40 MG tablet Take 1 tablet (40 mg total) by mouth daily.  Marland Kitchen triamcinolone cream (KENALOG) 0.1 % APPLY TOPICALLY A THIN LAYER TO THE AFFECTED AREA(S) TWICE A DAY  . [DISCONTINUED] metoprolol succinate (TOPROL-XL) 25 MG 24 hr tablet Take 25 mg by mouth in the morning and at bedtime.  . [DISCONTINUED] metoprolol succinate (TOPROL-XL) 25 MG 24 hr tablet Take 25 mg by mouth daily.  . [DISCONTINUED] metoprolol succinate (TOPROL-XL) 25 MG 24 hr tablet Take 1 tablet (25 mg total) by mouth daily.  . [DISCONTINUED] metoprolol succinate (TOPROL-XL) 25 MG 24 hr tablet Take 1 tablet (25 mg total) by mouth daily.     Allergies:  Contrast media [iodinated diagnostic agents] and Latex   Social History   Socioeconomic History  . Marital status: Married    Spouse name: Not on file  . Number of children: Not on file  . Years of education: Not on file  . Highest education level: Not on file  Occupational History  . Occupation: retired  Tobacco Use  . Smoking status: Former Research scientist (life sciences)  . Smokeless tobacco: Former Systems developer  . Tobacco comment: quit smoking about 6-7ytrs ago  Vaping Use  . Vaping Use: Never used  Substance and Sexual Activity  . Alcohol use: No    Comment: drank over 40 years ago  . Drug use: No  . Sexual activity: Yes  Other Topics Concern  . Not on file  Social  History Narrative  . Not on file   Social Determinants of Health   Financial Resource Strain: Low Risk   . Difficulty of Paying Living Expenses: Not hard at all  Food Insecurity: No Food Insecurity  . Worried About Charity fundraiser in the Last Year: Never true  . Ran Out of Food in the Last Year: Never true  Transportation Needs: No Transportation Needs  . Lack of Transportation (Medical): No  . Lack of Transportation (Non-Medical): No  Physical Activity: Inactive  . Days of Exercise per Week: 0 days  . Minutes of Exercise per Session: 0 min  Stress: No Stress Concern Present  . Feeling of Stress : Not at all  Social Connections:   . Frequency of Communication with Friends and Family: Not on file  . Frequency of Social Gatherings with Friends and Family: Not on file  . Attends Religious Services: Not on file  . Active Member of Clubs or Organizations: Not on file  . Attends Archivist Meetings: Not on file  . Marital Status: Not on file     Family History: The patient's family history includes Cancer in his cousin, maternal aunt, and paternal aunt; Heart attack in his mother.  ROS:   Please see the history of present illness.    Denies any fevers chills nausea vomiting syncope bleeding all other systems reviewed and are negative.  EKGs/Labs/Other Studies Reviewed:    The following studies were reviewed today:  Cardiac MRI EF 63% shows multifocal areas of mid wall late gadolinium enhancement.  EF normal.  Aorta maximally dilated at the sinus of Valsalva 45, normal ascending at 38.  Report for MRI said consider repeating in 3 to 6 months.  Cardiac catheterization on 07/22/2020 showed minimal, 20% stenoses.    Recent Labs: 05/04/2020: ALT 12 07/23/2020: BUN 17; Creatinine, Ser 1.02; Hemoglobin 13.2; Platelets 333; Potassium 4.4; Sodium 137  Recent Lipid Panel    Component Value Date/Time   CHOL 221 (H) 07/22/2020 1327   CHOL 193 05/04/2020 0902   TRIG 115  07/22/2020 1327   HDL 58 07/22/2020 1327   HDL 50 05/04/2020 0902   CHOLHDL 3.8 07/22/2020 1327   VLDL 23 07/22/2020 1327   LDLCALC 140 (H) 07/22/2020 1327   LDLCALC 123 (H) 05/04/2020 0902     Risk Assessment/Calculations:       Physical Exam:    VS:  BP 112/60   Pulse 68   Ht 5\' 7"  (1.702 m)   Wt 213 lb (96.6 kg)   SpO2 97%   BMI 33.36 kg/m     Wt Readings from Last 3 Encounters:  08/27/20 213 lb (96.6 kg)  07/28/20 211 lb 3.2 oz (95.8 kg)  07/23/20 206 lb 9.6 oz (93.7 kg)     GEN:  Well nourished, well developed in no acute distress HEENT: Normal NECK: No JVD; No carotid bruits LYMPHATICS: No lymphadenopathy CARDIAC: RRR, no murmurs, rubs, gallops RESPIRATORY:  Clear to auscultation without rales, wheezing or rhonchi  ABDOMEN: Soft, non-tender, non-distended MUSCULOSKELETAL:  No edema; No deformity  SKIN: Warm and dry NEUROLOGIC:  Alert and oriented x 3 PSYCHIATRIC:  Normal affect   ASSESSMENT:    1. Acute myocarditis, unspecified myocarditis type   2. Coronary artery disease involving native coronary artery of native heart without angina pectoris   3. Bifascicular block    PLAN:    In order of problems listed above:  Myocarditis -MRI diagnosis.  Repeat study recommended in 3- 6 months.  We will check MRI in February.  Doing well no chest pain.  Asked to limit very strenuous activity but okay to drive.  Likely source of chest pain with elevated troponin.  Minimal CAD.  At this point since he is clinically stable and over a month past diagnosis, he may go ahead and proceed with booster COVID shot.  Nonobstructive CAD -Continue with statin therapy. -Continue with Toprol-XL 25 mg once a day.  Low-dose aspirin 81 mg a day.  Previous hives rash -Could have been related to contrast.  Doing well.  Occasionally will take Benadryl at night.  Dilated aortic root, sinus Valsalva -45, 38.  Remainder of aorta appears normal.  We will keep an eye on  this.  Essential hypertension -Excellent control with Norvasc 10 quinapril 40 and now Toprol 25.  He showed me a list of numbers.  Bifascicular block on ECG -Watch for any signs of syncope.  Shared Decision Making/Informed Consent      Medication Adjustments/Labs and Tests Ordered: Current medicines are reviewed at length with the patient today.  Concerns regarding medicines are outlined above.  Orders Placed This Encounter  Procedures  . MR Card Morphology Wo/W Cm   Meds ordered this encounter  Medications  . DISCONTD: metoprolol succinate (TOPROL-XL) 25 MG 24 hr tablet    Sig: Take 1 tablet (25 mg total) by mouth daily.    Dispense:  90 tablet    Refill:  .  Marland Kitchen DISCONTD: metoprolol succinate (TOPROL-XL) 25 MG 24 hr tablet    Sig: Take 1 tablet (25 mg total) by mouth daily.    Dispense:  90 tablet    Refill:  .  . metoprolol succinate (TOPROL-XL) 25 MG 24 hr tablet    Sig: Take 1 tablet (25 mg total) by mouth daily.    Dispense:  90 tablet    Refill:  3    Patient Instructions  Medication Instructions:  Your physician has recommended you make the following change in your medication:  1.  CHANGE the Metoprolol to mornings only not twice a day   *If you need a refill on your cardiac medications before your next appointment, please call your pharmacy*   Lab Work: None ordered  If you have labs (blood work) drawn today and your tests are completely normal, you will receive your results only by: Marland Kitchen MyChart Message (if you have MyChart) OR . A paper copy in the mail If you have any lab test that is abnormal or we need to change your treatment, we will call you to review the results.   Testing/Procedures: Your physician has requested that you have a cardiac MRI in 11/2020. Cardiac MRI uses a computer to create images of  your heart as its beating, producing both still and moving pictures of your heart and major blood vessels. For further information please visit  [email or web address] Please follow the instruction sheet given to you today for more information.     Follow-Up: At [facility], you and your health needs are our priority.  As part of our continuing mission to provide you with exceptional heart care, we have created designated Provider Care Teams.  These Care Teams include your primary Cardiologist (physician) and Advanced Practice Providers (APPs -  Physician Assistants and Nurse Practitioners) who all work together to provide you with the care you need, when you need it.  We recommend signing up for the patient portal called "MyChart".  Sign up information is provided on this After Visit Summary.  MyChart is used to connect with patients for Virtual Visits (Telemedicine).  Patients are able to view lab/test results, encounter notes, upcoming appointments, etc.  Non-urgent messages can be sent to your provider as well.   To learn more about what you can do with MyChart, go to [email or web address].    Your next appointment:   6 month(s)  The format for your next appointment:   In Person  Provider:   Candee Furbish, MD   Other Instructions      Signed, Candee Furbish, MD  08/27/2020 9:19 AM    [employer]

## 2020-08-27 NOTE — Patient Instructions (Signed)
Medication Instructions:  Your physician has recommended you make the following change in your medication:  1.  CHANGE the Metoprolol to mornings only not twice a day   *If you need a refill on your cardiac medications before your next appointment, please call your pharmacy*   Lab Work: None ordered  If you have labs (blood work) drawn today and your tests are completely normal, you will receive your results only by: Marland Kitchen MyChart Message (if you have MyChart) OR . A paper copy in the mail If you have any lab test that is abnormal or we need to change your treatment, we will call you to review the results.   Testing/Procedures: Your physician has requested that you have a cardiac MRI in 11/2020. Cardiac MRI uses a computer to create images of your heart as its beating, producing both still and moving pictures of your heart and major blood vessels. For further information please visit [email or web address] Please follow the instruction sheet given to you today for more information.     Follow-Up: At [facility], you and your health needs are our priority.  As part of our continuing mission to provide you with exceptional heart care, we have created designated Provider Care Teams.  These Care Teams include your primary Cardiologist (physician) and Advanced Practice Providers (APPs -  Physician Assistants and Nurse Practitioners) who all work together to provide you with the care you need, when you need it.  We recommend signing up for the patient portal called "MyChart".  Sign up information is provided on this After Visit Summary.  MyChart is used to connect with patients for Virtual Visits (Telemedicine).  Patients are able to view lab/test results, encounter notes, upcoming appointments, etc.  Non-urgent messages can be sent to your provider as well.   To learn more about what you can do with MyChart, go to [email or web address].    Your next appointment:   6 month(s)  The format for  your next appointment:   In Person  Provider:   Candee Furbish, MD   Other Instructions

## 2020-08-30 ENCOUNTER — Telehealth: Payer: Self-pay | Admitting: Cardiology

## 2020-08-30 NOTE — Telephone Encounter (Signed)
Left message for patient to call to discuss scheduling the Cardiac MRI ordered by Dr. Marlou Porch

## 2020-08-31 DIAGNOSIS — Z23 Encounter for immunization: Secondary | ICD-10-CM | POA: Diagnosis not present

## 2020-09-07 ENCOUNTER — Other Ambulatory Visit: Payer: Self-pay | Admitting: Nurse Practitioner

## 2020-09-14 ENCOUNTER — Other Ambulatory Visit: Payer: Self-pay

## 2020-09-14 ENCOUNTER — Ambulatory Visit (INDEPENDENT_AMBULATORY_CARE_PROVIDER_SITE_OTHER): Payer: Medicare Other

## 2020-09-14 VITALS — BP 124/76 | HR 86 | Temp 98.3°F | Ht 67.0 in | Wt 214.2 lb

## 2020-09-14 DIAGNOSIS — Z23 Encounter for immunization: Secondary | ICD-10-CM | POA: Diagnosis not present

## 2020-09-14 NOTE — Progress Notes (Signed)
Patient here for a flu shot.

## 2020-11-02 ENCOUNTER — Telehealth: Payer: Self-pay | Admitting: Cardiology

## 2020-11-02 NOTE — Telephone Encounter (Signed)
Left message for patient to call and discuss scheduling the Cardiac MRI ordered by Dr. Marlou Porch (to be completed in February 2022)

## 2020-11-05 ENCOUNTER — Encounter: Payer: Self-pay | Admitting: Cardiology

## 2020-11-05 NOTE — Telephone Encounter (Signed)
Spoke with patient regarding scheduled appointment 12/06/20 at 8:00 am for the Cardiac MRI ordered by Dr. Dani Gobble time is 7:30 am--1st floor admissions office at [facility] for check in.  Will mail information to patient and he voiced his understanding.

## 2020-11-10 ENCOUNTER — Ambulatory Visit: Payer: Medicare Other

## 2020-11-10 ENCOUNTER — Ambulatory Visit: Payer: Medicare Other | Admitting: Nurse Practitioner

## 2020-11-10 ENCOUNTER — Encounter: Payer: Medicare Other | Admitting: Nurse Practitioner

## 2020-11-25 ENCOUNTER — Other Ambulatory Visit: Payer: Self-pay

## 2020-11-25 ENCOUNTER — Ambulatory Visit (INDEPENDENT_AMBULATORY_CARE_PROVIDER_SITE_OTHER): Payer: Medicare Other | Admitting: Nurse Practitioner

## 2020-11-25 ENCOUNTER — Ambulatory Visit (INDEPENDENT_AMBULATORY_CARE_PROVIDER_SITE_OTHER): Payer: Medicare Other

## 2020-11-25 ENCOUNTER — Encounter: Payer: Self-pay | Admitting: Nurse Practitioner

## 2020-11-25 ENCOUNTER — Other Ambulatory Visit: Payer: Self-pay | Admitting: Interventional Cardiology

## 2020-11-25 VITALS — BP 130/68 | HR 70 | Temp 97.6°F | Ht 67.0 in | Wt 215.2 lb

## 2020-11-25 DIAGNOSIS — R7309 Other abnormal glucose: Secondary | ICD-10-CM | POA: Diagnosis not present

## 2020-11-25 DIAGNOSIS — I10 Essential (primary) hypertension: Secondary | ICD-10-CM | POA: Diagnosis not present

## 2020-11-25 DIAGNOSIS — Z0000 Encounter for general adult medical examination without abnormal findings: Secondary | ICD-10-CM | POA: Diagnosis not present

## 2020-11-25 DIAGNOSIS — E7800 Pure hypercholesterolemia, unspecified: Secondary | ICD-10-CM | POA: Diagnosis not present

## 2020-11-25 LAB — POCT UA - MICROALBUMIN
Albumin/Creatinine Ratio, Urine, POC: <30
Creatinine, POC: 300 mg/dL
Microalbumin Ur, POC: 10 mg/L

## 2020-11-25 LAB — POCT URINALYSIS DIPSTICK
Bilirubin, UA: NEGATIVE
Blood, UA: NEGATIVE
Glucose, UA: NEGATIVE
Ketones, UA: NEGATIVE
Leukocytes, UA: NEGATIVE
Nitrite, UA: NEGATIVE
Protein, UA: NEGATIVE
Spec Grav, UA: >=1.03 — AB (ref 1.010–1.025)
Urobilinogen, UA: 0.2 E.U./dL
pH, UA: 5.5 (ref 5.0–8.0)

## 2020-11-25 MED ORDER — METOPROLOL SUCCINATE ER 25 MG PO TB24
25.0000 mg | ORAL_TABLET | Freq: Every day | ORAL | 3 refills | Status: DC
Start: 2020-11-25 — End: 2022-02-23

## 2020-11-25 NOTE — Patient Instructions (Signed)
Ryan Craig , Thank you for taking time to come for your Medicare Wellness Visit. I appreciate your ongoing commitment to your health goals. Please review the following plan we discussed and let me know if I can assist you in the future.   Screening recommendations/referrals: Colonoscopy: completed 08/27/2018 Recommended yearly ophthalmology/optometry visit for glaucoma screening and checkup Recommended yearly dental visit for hygiene and checkup  Vaccinations: Influenza vaccine: completed 09/14/2020 Pneumococcal vaccine: decline Tdap vaccine: completed 04/24/2013, due 04/25/2023 Shingles vaccine: discussed   Covid-19:  08/30/2020, 01/14/2020, 12/17/2019  Advanced directives: Please bring a copy of your POA (Power of Attorney) and/or Living Will to your next appointment.   Conditions/risks identified: none  Next appointment: Follow up in one year for your annual wellness visit.   Preventive Care 57 Years and Older, Male Preventive care refers to lifestyle choices and visits with your health care provider that can promote health and wellness. What does preventive care include?  A yearly physical exam. This is also called an annual well check.  Dental exams once or twice a year.  Routine eye exams. Ask your health care provider how often you should have your eyes checked.  Personal lifestyle choices, including:  Daily care of your teeth and gums.  Regular physical activity.  Eating a healthy diet.  Avoiding tobacco and drug use.  Limiting alcohol use.  Practicing safe sex.  Taking low doses of aspirin every day.  Taking vitamin and mineral supplements as recommended by your health care provider. What happens during an annual well check? The services and screenings done by your health care provider during your annual well check will depend on your age, overall health, lifestyle risk factors, and family history of disease. Counseling  Your health care provider may ask you  questions about your:  Alcohol use.  Tobacco use.  Drug use.  Emotional well-being.  Home and relationship well-being.  Sexual activity.  Eating habits.  History of falls.  Memory and ability to understand (cognition).  Work and work Statistician. Screening  You may have the following tests or measurements:  Height, weight, and BMI.  Blood pressure.  Lipid and cholesterol levels. These may be checked every 5 years, or more frequently if you are over 1 years old.  Skin check.  Lung cancer screening. You may have this screening every year starting at age 9 if you have a 30-pack-year history of smoking and currently smoke or have quit within the past 15 years.  Fecal occult blood test (FOBT) of the stool. You may have this test every year starting at age 4.  Flexible sigmoidoscopy or colonoscopy. You may have a sigmoidoscopy every 5 years or a colonoscopy every 10 years starting at age 66.  Prostate cancer screening. Recommendations will vary depending on your family history and other risks.  Hepatitis C blood test.  Hepatitis B blood test.  Sexually transmitted disease (STD) testing.  Diabetes screening. This is done by checking your blood sugar (glucose) after you have not eaten for a while (fasting). You may have this done every 1-3 years.  Abdominal aortic aneurysm (AAA) screening. You may need this if you are a current or former smoker.  Osteoporosis. You may be screened starting at age 14 if you are at high risk. Talk with your health care provider about your test results, treatment options, and if necessary, the need for more tests. Vaccines  Your health care provider may recommend certain vaccines, such as:  Influenza vaccine. This is recommended every  year.  Tetanus, diphtheria, and acellular pertussis (Tdap, Td) vaccine. You may need a Td booster every 10 years.  Zoster vaccine. You may need this after age 71.  Pneumococcal 13-valent conjugate  (PCV13) vaccine. One dose is recommended after age 86.  Pneumococcal polysaccharide (PPSV23) vaccine. One dose is recommended after age 40. Talk to your health care provider about which screenings and vaccines you need and how often you need them. This information is not intended to replace advice given to you by your health care provider. Make sure you discuss any questions you have with your health care provider. Document Released: 11/05/2015 Document Revised: 06/28/2016 Document Reviewed: 08/10/2015 Elsevier Interactive Patient Education  2017 [place] Prevention in the Home Falls can cause injuries. They can happen to people of all ages. There are many things you can do to make your home safe and to help prevent falls. What can I do on the outside of my home?  Regularly fix the edges of walkways and driveways and fix any cracks.  Remove anything that might make you trip as you walk through a door, such as a raised step or threshold.  Trim any bushes or trees on the path to your home.  Use bright outdoor lighting.  Clear any walking paths of anything that might make someone trip, such as rocks or tools.  Regularly check to see if handrails are loose or broken. Make sure that both sides of any steps have handrails.  Any raised decks and porches should have guardrails on the edges.  Have any leaves, snow, or ice cleared regularly.  Use sand or salt on walking paths during winter.  Clean up any spills in your garage right away. This includes oil or grease spills. What can I do in the bathroom?  Use night lights.  Install grab bars by the toilet and in the tub and shower. Do not use towel bars as grab bars.  Use non-skid mats or decals in the tub or shower.  If you need to sit down in the shower, use a plastic, non-slip stool.  Keep the floor dry. Clean up any water that spills on the floor as soon as it happens.  Remove soap buildup in the tub or shower  regularly.  Attach bath mats securely with double-sided non-slip rug tape.  Do not have throw rugs and other things on the floor that can make you trip. What can I do in the bedroom?  Use night lights.  Make sure that you have a light by your bed that is easy to reach.  Do not use any sheets or blankets that are too big for your bed. They should not hang down onto the floor.  Have a firm chair that has side arms. You can use this for support while you get dressed.  Do not have throw rugs and other things on the floor that can make you trip. What can I do in the kitchen?  Clean up any spills right away.  Avoid walking on wet floors.  Keep items that you use a lot in easy-to-reach places.  If you need to reach something above you, use a strong step stool that has a grab bar.  Keep electrical cords out of the way.  Do not use floor polish or wax that makes floors slippery. If you must use wax, use non-skid floor wax.  Do not have throw rugs and other things on the floor that can make you trip. What can  I do with my stairs?  Do not leave any items on the stairs.  Make sure that there are handrails on both sides of the stairs and use them. Fix handrails that are broken or loose. Make sure that handrails are as long as the stairways.  Check any carpeting to make sure that it is firmly attached to the stairs. Fix any carpet that is loose or worn.  Avoid having throw rugs at the top or bottom of the stairs. If you do have throw rugs, attach them to the floor with carpet tape.  Make sure that you have a light switch at the top of the stairs and the bottom of the stairs. If you do not have them, ask someone to add them for you. What else can I do to help prevent falls?  Wear shoes that:  Do not have high heels.  Have rubber bottoms.  Are comfortable and fit you well.  Are closed at the toe. Do not wear sandals.  If you use a stepladder:  Make sure that it is fully  opened. Do not climb a closed stepladder.  Make sure that both sides of the stepladder are locked into place.  Ask someone to hold it for you, if possible.  Clearly mark and make sure that you can see:  Any grab bars or handrails.  First and last steps.  Where the edge of each step is.  Use tools that help you move around (mobility aids) if they are needed. These include:  Canes.  Walkers.  Scooters.  Crutches.  Turn on the lights when you go into a dark area. Replace any light bulbs as soon as they burn out.  Set up your furniture so you have a clear path. Avoid moving your furniture around.  If any of your floors are uneven, fix them.  If there are any pets around you, be aware of where they are.  Review your medicines with your doctor. Some medicines can make you feel dizzy. This can increase your chance of falling. Ask your doctor what other things that you can do to help prevent falls. This information is not intended to replace advice given to you by your health care provider. Make sure you discuss any questions you have with your health care provider. Document Released: 08/05/2009 Document Revised: 03/16/2016 Document Reviewed: 11/13/2014 Elsevier Interactive Patient Education  2017 [employer].

## 2020-11-25 NOTE — Patient Instructions (Signed)

## 2020-11-25 NOTE — Progress Notes (Signed)
Subjective:   Rowley Groeneweg is a 75 y.o. male who presents for Medicare Annual/Subsequent preventive examination.  Review of Systems     Cardiac Risk Factors include: advanced age (>75men, >62 women);dyslipidemia;hypertension;male gender;obesity (BMI >30kg/m2);sedentary lifestyle     Objective:    Today's Vitals   11/25/20 0921  BP: 130/68  Pulse: 70  Temp: 97.6 F (36.4 C)  TempSrc: Oral  SpO2: 96%  Weight: 215 lb 3.2 oz (97.6 kg)  Height: 5\' 7"  (1.702 m)   Body mass index is 33.71 kg/m.  Advanced Directives 11/25/2020 10/29/2019 01/18/2015 12/23/2014 12/11/2014 02/24/2014  Does Patient Have a Medical Advance Directive? Yes Yes Yes Yes Yes Patient has advance directive, copy not in chart  Type of Advance Directive [place];Living will Living will [place];Living will Living will Living will [place];Living will  Does patient want to make changes to medical advance directive? - - - No - Patient declined - -  Copy of [place] in Chart? No - copy requested - - No - copy requested No - copy requested -    Current Medications (verified) Outpatient Encounter Medications as of 11/25/2020  Medication Sig  . acetaminophen (TYLENOL) 500 MG tablet Take 500 mg by mouth every 6 (six) hours as needed.  Marland Kitchen amLODipine (NORVASC) 10 MG tablet TAKE 1 TABLET BY MOUTH  DAILY  . aspirin EC 81 MG tablet Take 81 mg by mouth daily.  Marland Kitchen BLACK CURRANT SEED OIL PO Take by mouth.  . Cholecalciferol (VITAMIN D-3 PO) Take 2 tablets by mouth daily.   . metoprolol succinate (TOPROL-XL) 25 MG 24 hr tablet Take 1 tablet (25 mg total) by mouth daily.  . quinapril (ACCUPRIL) 40 MG tablet TAKE 1 TABLET BY MOUTH  DAILY  . rosuvastatin (CRESTOR) 40 MG tablet Take 1 tablet (40 mg total) by mouth daily.  Marland Kitchen triamcinolone cream (KENALOG) 0.1 % APPLY TOPICALLY A THIN LAYER TO THE AFFECTED AREA(S) TWICE A DAY   No facility-administered encounter  medications on file as of 11/25/2020.    Allergies (verified) Contrast media [iodinated diagnostic agents] and Latex   History: Past Medical History:  Diagnosis Date  . Anemia    no on any meds  . Arthritis   . Chronic back pain    deteriorating  . Hyperlipidemia    was on Lipitor but was taken off 3-70yrs ago by medical md bc running liver enzymes up  . Hypertension    takes Amlodipine and Quinapril daily  . Joint pain   . Joint swelling    Past Surgical History:  Procedure Laterality Date  . arthroscopic knee surgery Right   . COLONOSCOPY    . INGUINAL HERNIA REPAIR Bilateral    x 4  . JOINT REPLACEMENT Right 6 yrs ago   knee  . KNEE ARTHROSCOPY Left 02/24/2014   Procedure: LEFT KNEE ARTHROSCOPY WITH PARTIAL MEDIAL MENISCECTOMY;  Surgeon: Mcarthur Rossetti, MD;  Location: [facility];  Service: Orthopedics;  Laterality: Left;  . LEFT HEART CATH AND CORONARY ANGIOGRAPHY N/A 07/22/2020   Procedure: LEFT HEART CATH AND CORONARY ANGIOGRAPHY;  Surgeon: Burnell Blanks, MD;  Location: [facility];  Service: Cardiovascular;  Laterality: N/A;  . TOTAL KNEE ARTHROPLASTY Left 12/22/2014   Procedure: LEFT TOTAL KNEE ARTHROPLASTY;  Surgeon: Mcarthur Rossetti, MD;  Location: [place] [facility];  Service: Orthopedics;  Laterality: Left;   Family History  Problem Relation Age of Onset  . Heart attack  Mother   . Cancer Maternal Aunt   . Cancer Paternal Aunt   . Cancer Cousin    Social History   Socioeconomic History  . Marital status: Married    Spouse name: Not on file  . Number of children: Not on file  . Years of education: Not on file  . Highest education level: Not on file  Occupational History  . Occupation: retired  Tobacco Use  . Smoking status: Former Research scientist (life sciences)  . Smokeless tobacco: Former Systems developer  . Tobacco comment: quit smoking about 6-7ytrs ago  Vaping Use  . Vaping Use: Never used  Substance and Sexual Activity  . Alcohol use: No    Comment: drank over 40 years ago   . Drug use: No  . Sexual activity: Yes  Other Topics Concern  . Not on file  Social History Narrative  . Not on file   Social Determinants of Health   Financial Resource Strain: Low Risk   . Difficulty of Paying Living Expenses: Not hard at all  Food Insecurity: No Food Insecurity  . Worried About Charity fundraiser in the Last Year: Never true  . Ran Out of Food in the Last Year: Never true  Transportation Needs: No Transportation Needs  . Lack of Transportation (Medical): No  . Lack of Transportation (Non-Medical): No  Physical Activity: Insufficiently Active  . Days of Exercise per Week: 3 days  . Minutes of Exercise per Session: 10 min  Stress: No Stress Concern Present  . Feeling of Stress : Not at all  Social Connections: Not on file    Tobacco Counseling Counseling given: Not Answered Comment: quit smoking about 6-7ytrs ago   Clinical Intake:  Pre-visit preparation completed: Yes  Pain : No/denies pain     Nutritional Status: BMI > 30  Obese Nutritional Risks: None Diabetes: No  How often do you need to have someone help you when you read instructions, pamphlets, or other written materials from your doctor or pharmacy?: 1 - Never What is the last grade level you completed in school?: 11th grade  Diabetic? no  Interpreter Needed?: No  Information entered by :: NAllen LPN   Activities of Daily Living In your present state of health, do you have any difficulty performing the following activities: 11/25/2020  Hearing? N  Vision? N  Difficulty concentrating or making decisions? N  Walking or climbing stairs? N  Dressing or bathing? N  Doing errands, shopping? N  Preparing Food and eating ? N  Using the Toilet? N  In the past six months, have you accidently leaked urine? N  Do you have problems with loss of bowel control? N  Managing your Medications? N  Managing your Finances? N  Housekeeping or managing your Housekeeping? N  Some recent data  might be hidden    Patient Care Team: [name], FNP as PCP - General (General Practice) Jerline Pain, MD as PCP - Cardiology (Cardiology) Rex Kras, Claudette Stapler, RN as [place] any recent Medical Services you may have received from other than [facility] providers in the past year (date may be approximate).     Assessment:   This is a routine wellness examination for Brad.  Hearing/Vision screen No exam data present  Dietary issues and exercise activities discussed: Current Exercise Habits: Home exercise routine, Type of exercise: Other - see comments (stationary bike), Time (Minutes): 10, Frequency (Times/Week): 3, Weekly Exercise (Minutes/Week): 30  Goals    .  Patient Stated     10/29/2019, wants wrist to get better so he can get back to working in the yard    . Patient Stated     11/25/2020, stay healthy      Depression Screen PHQ 2/9 Scores 11/25/2020 10/29/2019 01/22/2019 10/22/2018 09/27/2018  PHQ - 2 Score 0 0 0 0 0  PHQ- 9 Score - 0 - - -    Fall Risk Fall Risk  11/25/2020 10/29/2019 01/22/2019 10/22/2018 09/27/2018  Falls in the past year? 1 1 0 1 0  Comment fell getting off the bike, fell in the garden tripped over grass - - -  Number falls in past yr: 1 - - 0 -  Comment - - - - -  Injury with Fall? 0 1 - 0 -  Comment - broke wrist - - -  Risk for fall due to : Medication side effect History of fall(s);Medication side effect - - -  Follow up Falls evaluation completed;Education provided;Falls prevention discussed Falls evaluation completed;Education provided;Falls prevention discussed - - -    FALL RISK PREVENTION PERTAINING TO THE HOME:  Any stairs in or around the home? Yes  If so, are there any without handrails? Yes  Home free of loose throw rugs in walkways, pet beds, electrical cords, etc? Yes  Adequate lighting in your home to reduce risk of falls? Yes   ASSISTIVE DEVICES UTILIZED TO PREVENT FALLS:  Life alert? No  Use of a  cane, walker or w/c? No  Grab bars in the bathroom? Yes  Shower chair or bench in shower? Yes  Elevated toilet seat or a handicapped toilet? No   TIMED UP AND GO:  Was the test performed? No .   Gait slow and steady without use of assistive device  Cognitive Function: MMSE - Mini Mental State Exam 10/22/2018  Orientation to time 5  Orientation to Place 5  Registration 3  Attention/ Calculation 5  Language- name 2 objects 2  Language- read & follow direction 1  Write a sentence 1  Copy design 1     6CIT Screen 11/25/2020 10/29/2019  What Year? 0 points 0 points  What month? 0 points 0 points  What time? 0 points 0 points  Count back from 20 0 points 0 points  Months in reverse 4 points 2 points  Repeat phrase 4 points 0 points  Total Score 8 2    Immunizations Immunization History  Administered Date(s) Administered  . Fluad Quad(high Dose 65+) 09/14/2020  . Influenza, High Dose Seasonal PF 09/27/2018, 08/05/2019  . PFIZER(Purple Top)SARS-COV-2 Vaccination 12/17/2019, 01/14/2020, 08/30/2020    TDAP status: Up to date  Flu Vaccine status: Up to date  Pneumococcal vaccine status: Declined,  Education has been provided regarding the importance of this vaccine but patient still declined. Advised may receive this vaccine at local pharmacy or Health Dept. Aware to provide a copy of the vaccination record if obtained from local pharmacy or Health Dept. Verbalized acceptance and understanding.   Covid-19 vaccine status: Completed vaccines  Qualifies for Shingles Vaccine? Yes   Zostavax completed No   Shingrix Completed?: No.    Education has been provided regarding the importance of this vaccine. Patient has been advised to call insurance company to determine out of pocket expense if they have not yet received this vaccine. Advised may also receive vaccine at local pharmacy or Health Dept. Verbalized acceptance and understanding.  Screening Tests Health Maintenance  Topic  Date Due  . PNA  vac Low Risk Adult (2 of 2 - PPSV23) 04/24/2014  . TETANUS/TDAP  04/25/2023  . INFLUENZA VACCINE  Completed  . COVID-19 Vaccine  Completed  . Hepatitis C Screening  Completed    Health Maintenance  Health Maintenance Due  Topic Date Due  . PNA vac Low Risk Adult (2 of 2 - PPSV23) 04/24/2014    Colorectal cancer screening: Type of screening: Colonoscopy. Completed 08/27/2018. Repeat every 10 years  Lung Cancer Screening: (Low Dose CT Chest recommended if Age 22-80 years, 30 pack-year currently smoking OR have quit w/in 15years.) does not qualify.   Lung Cancer Screening Referral: no  Additional Screening:  Hepatitis C Screening: does qualify; Completed 09/04/2017  Vision Screening: Recommended annual ophthalmology exams for early detection of glaucoma and other disorders of the eye. Is the patient up to date with their annual eye exam?  No  Who is the provider or what is the name of the office in which the patient attends annual eye exams? none If pt is not established with a provider, would they like to be referred to a provider to establish care? No .   Dental Screening: Recommended annual dental exams for proper oral hygiene  Community Resource Referral / Chronic Care Management: CRR required this visit?  No   CCM required this visit?  No      Plan:     I have personally reviewed and noted the following in the patient's chart:   . Medical and social history . Use of alcohol, tobacco or illicit drugs  . Current medications and supplements . Functional ability and status . Nutritional status . Physical activity . Advanced directives . List of other physicians . Hospitalizations, surgeries, and ER visits in previous 12 months . Vitals . Screenings to include cognitive, depression, and falls . Referrals and appointments  In addition, I have reviewed and discussed with patient certain preventive protocols, quality metrics, and best practice  recommendations. A written personalized care plan for preventive services as well as general preventive health recommendations were provided to patient.     Kellie Simmering, LPN   03/24/7034   Nurse Notes:

## 2020-11-25 NOTE — Progress Notes (Signed)
I,Yamilka Roman [employer] as a Education administrator for [employer], FNP.,have documented all relevant documentation on the behalf of [name], FNP,as directed by  [name], FNP while in the presence of [name], [place]. This visit occurred during the SARS-CoV-2 public health emergency.  Safety protocols were in place, including screening questions prior to the visit, additional usage of staff PPE, and extensive cleaning of exam room while observing appropriate contact time as indicated for disinfecting solutions.  Subjective:     Patient ID: Ryan Craig , male    DOB: Nov 24, 1945 , 75 y.o.   MRN: [id number]   Chief Complaint  Patient presents with  . Hypertension    HPI  Patient presents today for a f/u on his blood pressure. He is followed by Cardiology and is scheduled for an MRI cardiac.  He was hospitalized in October after eating fish and having chest pain.    Hypertension This is a chronic problem. The current episode started more than 1 year ago. The problem is unchanged. The problem is controlled. Pertinent negatives include no palpitations or shortness of breath. Risk factors for coronary artery disease include dyslipidemia, obesity and male gender. Past treatments include ACE inhibitors and calcium channel blockers. The current treatment provides significant improvement. There are no compliance problems.  There is no history of angina. There is no history of chronic renal disease.     Past Medical History:  Diagnosis Date  . Anemia    no on any meds  . Arthritis   . Chronic back pain    deteriorating  . Hyperlipidemia    was on Lipitor but was taken off 3-63yr ago by medical md bc running liver enzymes up  . Hypertension    takes Amlodipine and Quinapril daily  . Joint pain   . Joint swelling      Family History  Problem Relation Age of Onset  . Heart attack Mother   . Cancer Maternal Aunt   . Cancer Paternal Aunt   . Cancer Cousin      Current Outpatient  Medications:  .  acetaminophen (TYLENOL) 500 MG tablet, Take 500 mg by mouth every 6 (six) hours as needed., Disp: , Rfl:  .  amLODipine (NORVASC) 10 MG tablet, TAKE 1 TABLET BY MOUTH  DAILY, Disp: 90 tablet, Rfl: 3 .  aspirin EC 81 MG tablet, Take 81 mg by mouth daily., Disp: , Rfl:  .  BLACK CURRANT SEED OIL PO, Take by mouth., Disp: , Rfl:  .  Cholecalciferol (VITAMIN D-3 PO), Take 2 tablets by mouth daily. , Disp: , Rfl:  .  metoprolol succinate (TOPROL-XL) 25 MG 24 hr tablet, Take 1 tablet (25 mg total) by mouth daily., Disp: 90 tablet, Rfl: 3 .  quinapril (ACCUPRIL) 40 MG tablet, TAKE 1 TABLET BY MOUTH  DAILY, Disp: 90 tablet, Rfl: 3 .  rosuvastatin (CRESTOR) 40 MG tablet, Take 1 tablet (40 mg total) by mouth daily., Disp: 60 tablet, Rfl: 2 .  triamcinolone cream (KENALOG) 0.1 %, APPLY TOPICALLY A THIN LAYER TO THE AFFECTED AREA(S) TWICE A DAY, Disp: 454 g, Rfl: 0   Allergies  Allergen Reactions  . Contrast Media [Iodinated Diagnostic Agents] Anaphylaxis  . Latex Rash     Review of Systems  Constitutional: Negative.   HENT: Negative.   Eyes: Negative.   Respiratory: Negative.  Negative for shortness of breath.   Cardiovascular: Negative.  Negative for palpitations.  Gastrointestinal: Negative.   Endocrine: Negative.   Genitourinary: Negative.  Musculoskeletal: Negative.   Skin: Negative.   Neurological: Negative.   Hematological: Negative.   Psychiatric/Behavioral: Negative.      Today's Vitals   11/25/20 0937  BP: 130/68  Pulse: 70  Temp: 97.6 F (36.4 C)  TempSrc: Oral  Weight: 215 lb 2.7 oz (97.6 kg)  Height: 5' 7"  (1.702 m)  PainSc: 0-No pain   Body mass index is 33.7 kg/m.   Objective:  Physical Exam Vitals reviewed.  Constitutional:      Appearance: Normal appearance.  Cardiovascular:     Pulses: Normal pulses.     Heart sounds: Normal heart sounds.  Pulmonary:     Effort: Pulmonary effort is normal. No respiratory distress.     Breath sounds:  Normal breath sounds.  Neurological:     General: No focal deficit present.     Mental Status: He is alert and oriented to person, place, and time.     Cranial Nerves: No cranial nerve deficit.  Psychiatric:        Mood and Affect: Mood normal.        Behavior: Behavior normal.        Thought Content: Thought content normal.        Judgment: Judgment normal.         Assessment And Plan:     1. Essential hypertension  Chronic, fair control  Continue current medications - Lipid panel - CMP14+EGFR  2. Pure hypercholesterolemia  Chronic, stable  Tolerating rosuvastatin well without muscle aches - Lipid panel - CMP14+EGFR  3. Other abnormal glucose  Chronic, stable  Diet controlled  Continue with healthy diet low in sugar and starches - Hemoglobin A1c     Patient was given opportunity to ask questions. Patient verbalized understanding of the plan and was able to repeat key elements of the plan. All questions were answered to their satisfaction.  [name], FNP     I, [name], FNP, have reviewed all documentation for this visit. The documentation on 11/25/20 for the exam, diagnosis, procedures, and orders are all accurate and complete.    THE PATIENT IS ENCOURAGED TO PRACTICE SOCIAL DISTANCING DUE TO THE COVID-19 PANDEMIC.

## 2020-11-25 NOTE — Addendum Note (Signed)
Addended by: Kellie Simmering on: 11/25/2020 12:03 PM   Modules accepted: Orders

## 2020-11-26 LAB — CMP14+EGFR
ALT: 15 IU/L (ref 0–44)
AST: 22 IU/L (ref 0–40)
Albumin/Globulin Ratio: 1.9 (ref 1.2–2.2)
Albumin: 4.6 g/dL (ref 3.7–4.7)
Alkaline Phosphatase: 66 IU/L (ref 44–121)
BUN/Creatinine Ratio: 16 (ref 10–24)
BUN: 16 mg/dL (ref 8–27)
Bilirubin Total: 0.6 mg/dL (ref 0.0–1.2)
CO2: 23 mmol/L (ref 20–29)
Calcium: 10 mg/dL (ref 8.6–10.2)
Chloride: 103 mmol/L (ref 96–106)
Creatinine, Ser: 0.98 mg/dL (ref 0.76–1.27)
GFR calc Af Amer: 86 mL/min/{1.73_m2} (ref >59)
GFR calc non Af Amer: 75 mL/min/{1.73_m2} (ref >59)
Globulin, Total: 2.4 g/dL (ref 1.5–4.5)
Glucose: 104 mg/dL — ABNORMAL HIGH (ref 65–99)
Potassium: 4.7 mmol/L (ref 3.5–5.2)
Sodium: 140 mmol/L (ref 134–144)
Total Protein: 7 g/dL (ref 6.0–8.5)

## 2020-11-26 LAB — LIPID PANEL
Chol/HDL Ratio: 2.5 ratio (ref 0.0–5.0)
Cholesterol, Total: 134 mg/dL (ref 100–199)
HDL: 54 mg/dL (ref >39)
LDL Chol Calc (NIH): 61 mg/dL (ref 0–99)
Triglycerides: 101 mg/dL (ref 0–149)
VLDL Cholesterol Cal: 19 mg/dL (ref 5–40)

## 2020-11-26 LAB — HEMOGLOBIN A1C
Est. average glucose Bld gHb Est-mCnc: 117 mg/dL
Hgb A1c MFr Bld: 5.7 % — ABNORMAL HIGH (ref 4.8–5.6)

## 2020-12-02 ENCOUNTER — Telehealth (HOSPITAL_COMMUNITY): Payer: Self-pay | Admitting: Emergency Medicine

## 2020-12-02 NOTE — Telephone Encounter (Signed)
Reaching out to patient to offer assistance regarding upcoming cardiac imaging study; pt verbalizes understanding of appt date/time, parking situation and where to check in, pre-test NPO status and medications ordered, and verified current allergies; name and call back number provided for further questions should they arise [name] RN [facility] [phone] office [phone] cell  Pt denies metal implants, denies claustro Ryan Craig

## 2020-12-06 ENCOUNTER — Ambulatory Visit (HOSPITAL_COMMUNITY)
Admission: RE | Admit: 2020-12-06 | Discharge: 2020-12-06 | Disposition: A | Discharge status: Home / Self Care | Payer: Medicare Other | Source: Ambulatory Visit | Attending: Cardiology | Admitting: Cardiology

## 2020-12-06 ENCOUNTER — Other Ambulatory Visit: Payer: Self-pay

## 2020-12-06 DIAGNOSIS — I409 Acute myocarditis, unspecified: Secondary | ICD-10-CM

## 2020-12-06 IMAGING — MR MR CARD MORPHOLOGY WO/W CM
45 of 48 series · 45 of 48 positions shown · IV contrast (Contrast agent)
Comparison: PICHON [DATE]

EXAM:
CARDIAC MRI

CLINICAL DATA: Structural (nonvalvular) heart disease, follow up
TECHNIQUE: The patient was scanned on a 1.5 Tesla GE magnet. A dedicated
cardiac coil was used. Functional imaging was done using [employer]iest[employer]
sequences. [DATE], and 4 chamber views were done to assess for RWMA's.
Modified PICHON rule using a short axis stack was used to
calculate an ejection fraction on a dedicated work station using
Circle software. The patient received 10mL GADAVIST GADOBUTROL 1
MMOL/ML IV SOLN. After 10 minutes inversion recovery sequences were
used to assess for infiltration and scar tissue.

[Series 4: [id number]_bh · axial · 8.0mm · 1.41mm/px · 1 of 16 slices shown]
[im 1/16]
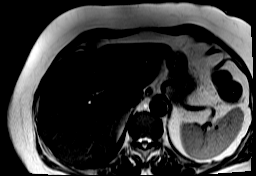

[Series 8: bSSFP · oblique · 8.0mm · 1.61mm/px · 1 of 25 slices shown (1 of 21)]
[im 1/25]
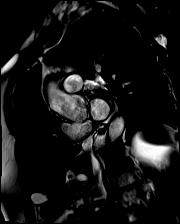

[Series 9: bSSFP · oblique · 8.0mm · 1.61mm/px · 1 of 25 slices shown (2 of 21)]
[im 1/25]
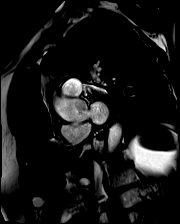

[Series 10: bSSFP · oblique · 8.0mm · 1.61mm/px · 1 of 25 slices shown (3 of 21)]
[im 1/25]
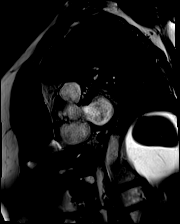

[Series 11: bSSFP · oblique · 8.0mm · 1.61mm/px · 1 of 25 slices shown (4 of 21)]
[im 1/25]
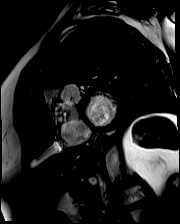

[Series 12: bSSFP · oblique · 8.0mm · 1.61mm/px · 1 of 25 slices shown (5 of 21)]
[im 1/25]
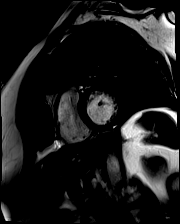

[Series 13: bSSFP · oblique · 8.0mm · 1.61mm/px · 1 of 22 slices shown (6 of 21)]
[im 1/22]
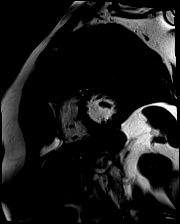

[Series 14: bSSFP · oblique · 8.0mm · 1.61mm/px · 1 of 22 slices shown (7 of 21)]
[im 1/22]
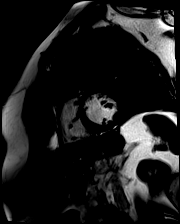

[Series 15: bSSFP · oblique · 8.0mm · 1.61mm/px · 1 of 22 slices shown (8 of 21)]
[im 1/22]
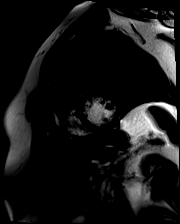

[Series 16: bSSFP · oblique · 8.0mm · 1.61mm/px · 1 of 22 slices shown (9 of 21)]
[im 1/22]
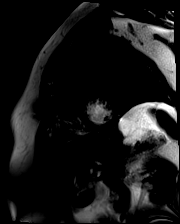

[Series 17: bSSFP · oblique · 8.0mm · 1.61mm/px · 1 of 22 slices shown (10 of 21)]
[im 1/22]
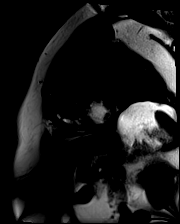

[Series 18: bSSFP · oblique · 8.0mm · 1.61mm/px · 1 of 22 slices shown (11 of 21)]
[im 1/22]
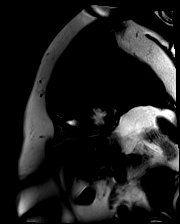

[Series 19: bSSFP · oblique · 8.0mm · 1.61mm/px · 1 of 22 slices shown (12 of 21)]
[im 1/22]
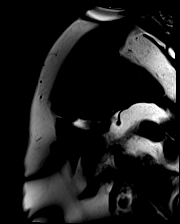

[Series 20: bSSFP · oblique · 8.0mm · 1.61mm/px · 1 of 22 slices shown (13 of 21)]
[im 1/22]
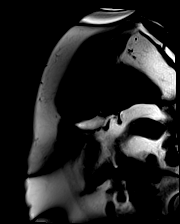

[Series 21: bSSFP · oblique · 8.0mm · 1.61mm/px · 1 of 22 slices shown (14 of 21)]
[im 1/22]
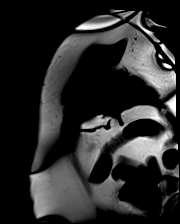

[Series 22: bSSFP · oblique · 8.0mm · 1.61mm/px · 1 of 22 slices shown (15 of 21)]
[im 1/22]
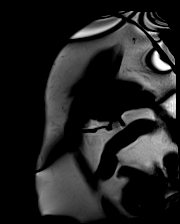

[Series 23: bSSFP · axial · 6.0mm · 1.41mm/px · 1 of 25 slices shown (16 of 21)]
[im 1/25]
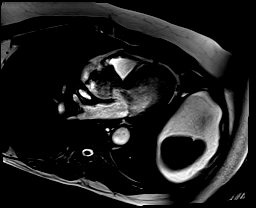

[Series 24: bSSFP · oblique · 6.0mm · 1.41mm/px · 1 of 25 slices shown (17 of 21)]
[im 1/25]
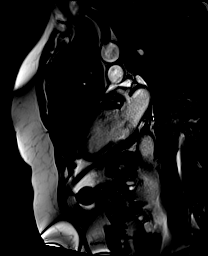

[Series 25: bSSFP · axial · 6.0mm · 1.41mm/px · 1 of 25 slices shown (18 of 21)]
[im 1/25]
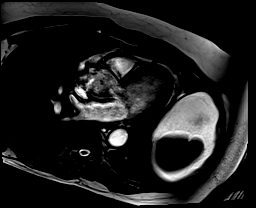

[Series 26: bSSFP · axial · 6.0mm · 1.41mm/px · 1 of 25 slices shown (19 of 21)]
[im 1/25]
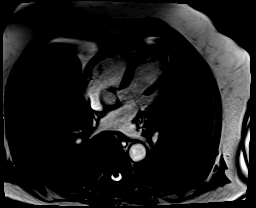

[Series 27: STIR · oblique · 8.0mm · 1.92mm/px · 1 of 15 slices shown]
[im 1/15]
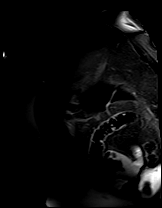

[Series 28: bSSFP · axial · 6.0mm · 1.41mm/px · 1 of 25 slices shown (20 of 21)]
[im 1/25]
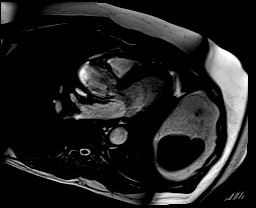

[Series 29: (id)_long_t1 · oblique · 8.0mm · 1.56mm/px · 1 of 24 slices shown]
[im 1/24]
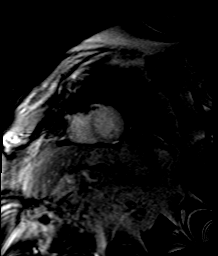

[Series 30: (id)_long_t1_moco · oblique · 8.0mm · 1.56mm/px · 1 of 24 slices shown]
[im 1/24]
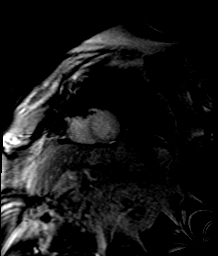

[Series 31: (id)_long_t1_moco_t1 · oblique · 8.0mm · 1.56mm/px · 1 of 6 slices shown]
[im 1/6]
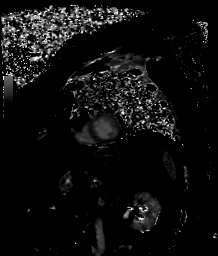

[Series 33: (id)_trufi · oblique · 8.0mm · 2.08mm/px · 1 of 9 slices shown]
[im 1/9]
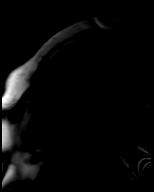

[Series 34: (id)_trufi_moco · oblique · 8.0mm · 2.08mm/px · 1 of 9 slices shown]
[im 1/9]
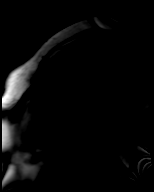

[Series 37: pre short axis · oblique · non-contrast · 8.0mm · 2.25mm/px · 1 of 10 slices shown (1 of 6)]
[im 1/10]
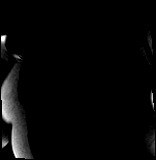

[Series 38: pre short axis · oblique · non-contrast · 8.0mm · 2.25mm/px · 1 of 10 slices shown (2 of 6)]
[im 1/10]
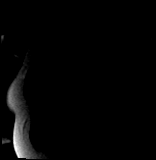

[Series 39: pre short axis · oblique · non-contrast · 8.0mm · 2.25mm/px · 1 of 10 slices shown (3 of 6)]
[im 1/10]
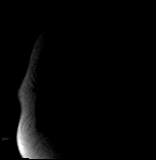

[Series 40: pre short axis · oblique · non-contrast · 8.0mm · 2.25mm/px · 1 of 10 slices shown (4 of 6)]
[im 1/10]
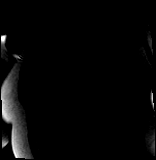

[Series 41: pre short axis · oblique · non-contrast · 8.0mm · 2.25mm/px · 1 of 10 slices shown (5 of 6)]
[im 1/10]
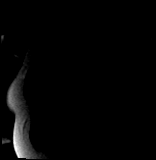

[Series 42: pre short axis · oblique · non-contrast · 8.0mm · 2.25mm/px · 1 of 10 slices shown (6 of 6)]
[im 1/10]
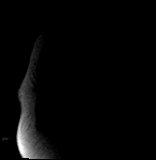

[Series 43: rest short axis · oblique · 8.0mm · 2.25mm/px · 1 of 80 slices shown (1 of 6)]
[im 1/80]
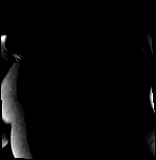

[Series 44: rest short axis · oblique · 8.0mm · 2.25mm/px · 1 of 80 slices shown (2 of 6)]
[im 1/80]
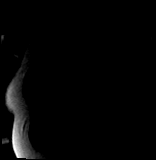

[Series 45: rest short axis · oblique · 8.0mm · 2.25mm/px · 1 of 80 slices shown (3 of 6)]
[im 1/80]
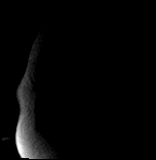

[Series 46: rest short axis · oblique · 8.0mm · 2.25mm/px · 1 of 80 slices shown (4 of 6)]
[im 1/80]
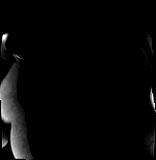

[Series 47: rest short axis · oblique · 8.0mm · 2.25mm/px · 1 of 80 slices shown (5 of 6)]
[im 1/80]
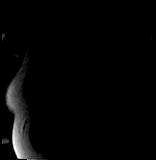

[Series 48: rest short axis · oblique · 8.0mm · 2.25mm/px · 1 of 80 slices shown (6 of 6)]
[im 1/80]
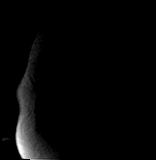

[Series 49: bSSFP · coronal · 6.0mm · 1.41mm/px · 1 of 25 slices shown (21 of 21)]
[im 1/25]
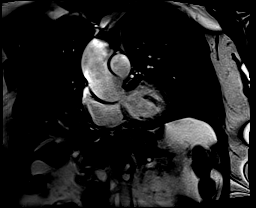

[Series 50: cine rvit · oblique · 6.0mm · 1.41mm/px · 1 of 25 slices shown]
[im 1/25]
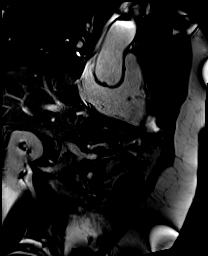

[Series 51: aortic valve cine · oblique · 6.0mm · 1.41mm/px · 1 of 25 slices shown]
[im 1/25]
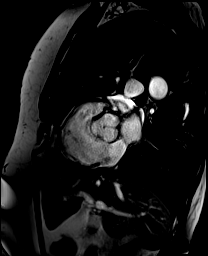

[Series 52: cine rvot · sagittal · 6.0mm · 1.41mm/px · 1 of 25 slices shown]
[im 1/25]
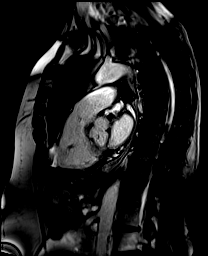

[Series 54: lge_single shot sa · oblique · 8.0mm · 2.08mm/px · 1 of 15 slices shown (1 of 2)]
[im 1/15]
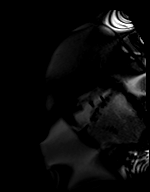

[Series 55: lge_single shot sa · oblique · 8.0mm · 2.08mm/px · 1 of 15 slices shown (2 of 2)]
[im 1/15]
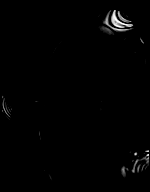

[45 of 48 positions shown; findings below may reference images not displayed]

FINDINGS: LEFT VENTRICLE:

Normal left ventricular chamber size.

Mild concentric hypertrophy.

Normal left ventricular systolic function.

LVEF = 61%

Mild hypokinesis of the basal inferoseptum and inferior wall, and
mild hypokinesis of the mid inferior wall.

No myocardial edema, T2 time is 51 msec.

Normal first pass perfusion.

There is post contrast delayed myocardial enhancement. Prior study
reviewed side by side. Grossly similar appearance.

On today's study there is a mid-myocardial stripe of delayed
enhancement in the basal septum, and a small focus of midmyocardial
delayed enhancement in the mid inferior wall, and subendocardial
enhancement in the mid inferoseptum.

Normal T1 myocardial nulling kinetics suggest against a diagnosis of
cardiac amyloidosis.

ECV = 28%, slightly elevated from normal range, may be secondary to
hypertensive heart disease.

RIGHT VENTRICLE:

Normal right ventricular chamber size.

Normal right ventricular wall thickness.

Normal right ventricular systolic function.

Unable to calculate ejection fraction due to technical factors.

There are no regional wall motion abnormalities.

No post contrast delayed myocardial enhancement.

ATRIA:

Normal left atrial size.

Normal right atrial size.

VALVES:

Aortic valve regurgitation. Tricuspid aortic valve. Not quantitated
on this study. Visually appears moderate.

GREAT VESSELS:

Aorta measures 40 mm at mid ascending aorta and 45 mm at sinus of
Valsalva measured in axial plane and transverse, respectively.

PERICARDIUM:

Normal pericardium.  No pericardial effusion.

OTHER: No significant extracardiac findings.

MEASUREMENTS:
Slice alignment was technically limited, measurements cannot be
performed.
IMPRESSION: 1. Motion artifact reduces the diagnostic quality of this study.

2. On side by side comparison of images, there is no significant
change in LVEF, wall motion, or pattern of delayed enhancement.

3. Normal LV size and systolic function. LVEF 61%. Mild hypokinesis
of basal inferoseptum and basal-mid inferior wall.

4. No myocardial edema. Mild focal delayed enhancement as noted in
findings. Visually similar distribution to prior study. Findings
suggest stable features of prior myocarditis with no definite active
inflammation.

5. Tricuspid aortic valve with visually estimated moderate aortic
valve regurgitation, recommend correlation with echo.

6. Moderately dilated ascending aorta, 45 mm at sinus of Valsalva
and 40 mm at mid ascending aorta. These measurements are not
orthogonal but appear grossly stable compared to prior study.

## 2020-12-06 MED ORDER — GADOBUTROL 1 MMOL/ML IV SOLN
10.0000 mL | Freq: Once | INTRAVENOUS | Status: AC | PRN
  Administered 2020-12-06: 10 mL via INTRAVENOUS

## 2020-12-09 ENCOUNTER — Telehealth: Payer: Self-pay | Admitting: Cardiology

## 2020-12-09 NOTE — Telephone Encounter (Signed)
Patient is requesting to review 12/06/20 MRI. Please call.

## 2020-12-09 NOTE — Telephone Encounter (Signed)
Pt made aware that Dr. Marlou Porch nor his nurse are in the office today. Aware I will forward to them to follow up on. Aware MRI result is not viewable yet or hasn't been read by radiologist.  He questioned why being that testing was performed on Monday. Pt aware Pam will follow up with him tomorrow..... He appreciates my updating him (and wished him happy birthday)

## 2020-12-10 NOTE — Telephone Encounter (Signed)
Dr Marlou Porch is aware MRI needs to be read and resulted.  Radiology is working on getting the results to him as that this can be completed.

## 2020-12-15 NOTE — Telephone Encounter (Signed)
Ryan Campbell, LPN  11/10/1476 29:56 AM EST Back to Top     Pt aware of MRI results .Ryan Craig   Ryan Pain, MD  12/14/2020 7:09 AM EST      No active myocarditis. Normal pump function Dilated aortic root unchanged (will follow in one year with echocardiogram) Mild to moderate aortic valve regurgitation (will follow with echo in one year)  Improved MRI Reassuring  Ryan Furbish, MD

## 2021-01-13 ENCOUNTER — Other Ambulatory Visit: Payer: Self-pay | Admitting: Nurse Practitioner

## 2021-02-07 DIAGNOSIS — Z23 Encounter for immunization: Secondary | ICD-10-CM | POA: Diagnosis not present

## 2021-02-22 ENCOUNTER — Encounter: Payer: Self-pay | Admitting: Nurse Practitioner

## 2021-02-22 ENCOUNTER — Ambulatory Visit (INDEPENDENT_AMBULATORY_CARE_PROVIDER_SITE_OTHER): Payer: Medicare Other | Admitting: Nurse Practitioner

## 2021-02-22 ENCOUNTER — Other Ambulatory Visit: Payer: Self-pay

## 2021-02-22 VITALS — BP 142/76 | HR 69 | Temp 97.9°F | Ht 67.0 in | Wt 217.4 lb

## 2021-02-22 DIAGNOSIS — E6609 Other obesity due to excess calories: Secondary | ICD-10-CM

## 2021-02-22 DIAGNOSIS — E7800 Pure hypercholesterolemia, unspecified: Secondary | ICD-10-CM

## 2021-02-22 DIAGNOSIS — I10 Essential (primary) hypertension: Secondary | ICD-10-CM

## 2021-02-22 DIAGNOSIS — M2550 Pain in unspecified joint: Secondary | ICD-10-CM

## 2021-02-22 DIAGNOSIS — R109 Unspecified abdominal pain: Secondary | ICD-10-CM

## 2021-02-22 DIAGNOSIS — Z6834 Body mass index (BMI) 34.0-34.9, adult: Secondary | ICD-10-CM

## 2021-02-22 LAB — POCT URINALYSIS DIPSTICK
Bilirubin, UA: NEGATIVE
Blood, UA: NEGATIVE
Glucose, UA: NEGATIVE
Ketones, UA: NEGATIVE
Leukocytes, UA: NEGATIVE
Nitrite, UA: NEGATIVE
Protein, UA: NEGATIVE
Spec Grav, UA: 1.02 (ref 1.010–1.025)
Urobilinogen, UA: 0.2 E.U./dL
pH, UA: 5.5 (ref 5.0–8.0)

## 2021-02-22 MED ORDER — CYCLOBENZAPRINE HCL 5 MG PO TABS: 5.0000 mg | ORAL_TABLET | Freq: Three times a day (TID) | ORAL | 0 refills | Status: DC | PRN

## 2021-02-22 MED ORDER — DICLOFENAC SODIUM 1 % EX GEL: 2.0000 g | Freq: Four times a day (QID) | CUTANEOUS | 2 refills | Status: DC

## 2021-02-22 NOTE — Progress Notes (Signed)
I,Yamilka Roman [employer] as a Education administrator for [employer], FNP.,have documented all relevant documentation on the behalf of [name], FNP,as directed by  [name], FNP while in the presence of [name], [place]. This visit occurred during the SARS-CoV-2 public health emergency.  Safety protocols were in place, including screening questions prior to the visit, additional usage of staff PPE, and extensive cleaning of exam room while observing appropriate contact time as indicated for disinfecting solutions.  Subjective:     Patient ID: Ryan Craig , male    DOB: [birth year] , 75 y.o.   MRN: [id number]   Chief Complaint  Patient presents with  . Hypertension    HPI  Patient is here for htn follow up. He reports compliance with medications. He has concerns with back and joint pains.     Past Medical History:  Diagnosis Date  . Anemia    no on any meds  . Arthritis   . Chronic back pain    deteriorating  . Hyperlipidemia    was on Lipitor but was taken off 3-48yr ago by medical md bc running liver enzymes up  . Hypertension    takes Amlodipine and Quinapril daily  . Joint pain   . Joint swelling      Family History  Problem Relation Age of Onset  . Heart attack Mother   . Cancer Maternal Aunt   . Cancer Paternal Aunt   . Cancer Cousin      Current Outpatient Medications:  .  acetaminophen (TYLENOL) 500 MG tablet, Take 500 mg by mouth every 6 (six) hours as needed., Disp: , Rfl:  .  amLODipine (NORVASC) 10 MG tablet, TAKE 1 TABLET BY MOUTH  DAILY, Disp: 90 tablet, Rfl: 3 .  aspirin EC 81 MG tablet, Take 81 mg by mouth daily., Disp: , Rfl:  .  BLACK CURRANT SEED OIL PO, Take by mouth., Disp: , Rfl:  .  Cholecalciferol (VITAMIN D-3 PO), Take 2 tablets by mouth daily. , Disp: , Rfl:  .  cyclobenzaprine (FLEXERIL) 5 MG tablet, Take 1 tablet (5 mg total) by mouth 3 (three) times daily as needed for muscle spasms., Disp: 30 tablet, Rfl: 0 .  diclofenac Sodium (VOLTAREN)  1 % GEL, Apply 2 g topically 4 (four) times daily., Disp: 100 g, Rfl: 2 .  metoprolol succinate (TOPROL-XL) 25 MG 24 hr tablet, Take 1 tablet (25 mg total) by mouth daily., Disp: 90 tablet, Rfl: 3 .  quinapril (ACCUPRIL) 40 MG tablet, TAKE 1 TABLET BY MOUTH  DAILY, Disp: 90 tablet, Rfl: 3 .  rosuvastatin (CRESTOR) 40 MG tablet, Take 1 tablet (40 mg total) by mouth daily., Disp: 60 tablet, Rfl: 2 .  triamcinolone (KENALOG) 0.1 %, APPLY TOPICALLY A THIN LAYER TO THE AFFECTED AREA(S) TWICE A DAY, Disp: 454 g, Rfl: 0   Allergies  Allergen Reactions  . Contrast Media [Iodinated Diagnostic Agents] Anaphylaxis  . Latex Rash     Review of Systems  Constitutional: Negative.   Respiratory: Negative.   Cardiovascular: Negative.   Gastrointestinal: Negative.   Musculoskeletal: Positive for arthralgias (left shoulder pain - when lying on his left side will have pain to right low back. ) and back pain (right low back pain near flank area). Negative for joint swelling.  Neurological: Negative.   Psychiatric/Behavioral: Negative.      Today's Vitals   02/22/21 0840  BP: (!) 142/76  Pulse: 69  Temp: 97.9 F (36.6 C)  TempSrc: Oral  Weight: 217  lb 6.4 oz (98.6 kg)  Height: _0  (1.702 m)   Body mass index is 34.05 kg/m.  Wt Readings from Last 3 Encounters:  02/22/21 217 lb 6.4 oz (98.6 kg)  11/25/20 215 lb 2.7 oz (97.6 kg)  11/25/20 215 lb 3.2 oz (97.6 kg)    Objective:  Physical Exam Vitals reviewed.  Constitutional:      General: He is not in acute distress.    Appearance: Normal appearance. He is obese.  Cardiovascular:     Rate and Rhythm: Normal rate and regular rhythm.     Pulses: Normal pulses.     Heart sounds: Normal heart sounds. No murmur heard.   Pulmonary:     Effort: Pulmonary effort is normal. No respiratory distress.     Breath sounds: Normal breath sounds. No wheezing.  Abdominal:     Tenderness: There is right CVA tenderness. There is no left CVA tenderness.   Musculoskeletal:        General: No swelling or tenderness. Normal range of motion.     Comments: Tenderness noted to right low back at flank area to touch.   Skin:    Capillary Refill: Capillary refill takes less than 2 seconds.  Neurological:     General: No focal deficit present.     Mental Status: He is alert and oriented to person, place, and time.     Cranial Nerves: No cranial nerve deficit.  Psychiatric:        Mood and Affect: Mood normal.        Behavior: Behavior normal.        Thought Content: Thought content normal.        Judgment: Judgment normal.         Assessment And Plan:     1. Essential hypertension  Chronic, slightly elevated today however he is having pain   Continue with current medications - CBC - CMP14+EGFR  2. Class 1 obesity due to excess calories with serious comorbidity and body mass index (BMI) of 34.0 to 34.9 in adult  Chronic  Discussed healthy diet and regular exercise options   Encouraged to exercise at least 150 minutes per week with 2 days of strength training as tolerated  He is encouraged to initially strive for BMI less than 30 to decrease cardiac risk.   3. Arthralgia, unspecified joint  He has pain to his left shoulder, pain to anterior bursa space  He is to apply diclofenac gel to area of pain  I will also order an autoimmune panel - Autoimmune Profile  4. Pure hypercholesterolemia  Chronic, stable  No current medications - Lipid panel  5. Right flank pain  Positive CVA tenderness right side  Tenderness to right flank area  Will treat with muscle relaxer as well - POCT Urinalysis Dipstick (19147) - Culture, Urine     Patient was given opportunity to ask questions. Patient verbalized understanding of the plan and was able to repeat key elements of the plan. All questions were answered to their satisfaction.  [name], FNP   I, [name], FNP, have reviewed all documentation for this visit. The  documentation on 02/22/21 for the exam, diagnosis, procedures, and orders are all accurate and complete.   IF YOU HAVE BEEN REFERRED TO A SPECIALIST, IT MAY TAKE 1-2 WEEKS TO SCHEDULE/PROCESS THE REFERRAL. IF YOU HAVE NOT HEARD FROM US/SPECIALIST IN TWO WEEKS, PLEASE GIVE [place] A CALL AT [phone].   THE PATIENT IS ENCOURAGED TO PRACTICE  SOCIAL DISTANCING DUE TO THE COVID-19 PANDEMIC.

## 2021-02-22 NOTE — Patient Instructions (Signed)

## 2021-02-23 LAB — CBC
Hematocrit: 39.8 % (ref 37.5–51.0)
Hemoglobin: 13.1 g/dL (ref 13.0–17.7)
MCH: 28.5 pg (ref 26.6–33.0)
MCHC: 32.9 g/dL (ref 31.5–35.7)
MCV: 87 fL (ref 79–97)
Platelets: 307 10*3/uL (ref 150–450)
RBC: 4.59 x10E6/uL (ref 4.14–5.80)
RDW: 11.9 % (ref 11.6–15.4)
WBC: 5.8 10*3/uL (ref 3.4–10.8)

## 2021-02-23 LAB — CMP14+EGFR
ALT: 12 IU/L (ref 0–44)
AST: 20 IU/L (ref 0–40)
Albumin/Globulin Ratio: 1.6 (ref 1.2–2.2)
Albumin: 4.3 g/dL (ref 3.7–4.7)
Alkaline Phosphatase: 64 IU/L (ref 44–121)
BUN/Creatinine Ratio: 14 (ref 10–24)
BUN: 13 mg/dL (ref 8–27)
Bilirubin Total: 0.7 mg/dL (ref 0.0–1.2)
CO2: 21 mmol/L (ref 20–29)
Calcium: 10 mg/dL (ref 8.6–10.2)
Chloride: 104 mmol/L (ref 96–106)
Creatinine, Ser: 0.94 mg/dL (ref 0.76–1.27)
Globulin, Total: 2.7 g/dL (ref 1.5–4.5)
Glucose: 107 mg/dL — ABNORMAL HIGH (ref 65–99)
Potassium: 4.6 mmol/L (ref 3.5–5.2)
Sodium: 141 mmol/L (ref 134–144)
Total Protein: 7 g/dL (ref 6.0–8.5)
eGFR: 83 mL/min/{1.73_m2} (ref >59)

## 2021-02-23 LAB — AUTOIMMUNE PROFILE
Anti Nuclear Antibody (ANA): NEGATIVE
Complement C3, Serum: 146 mg/dL (ref 82–167)
dsDNA Ab: <1 IU/mL (ref 0–9)

## 2021-02-23 LAB — LIPID PANEL
Chol/HDL Ratio: 2.6 ratio (ref 0.0–5.0)
Cholesterol, Total: 134 mg/dL (ref 100–199)
HDL: 52 mg/dL (ref >39)
LDL Chol Calc (NIH): 61 mg/dL (ref 0–99)
Triglycerides: 118 mg/dL (ref 0–149)
VLDL Cholesterol Cal: 21 mg/dL (ref 5–40)

## 2021-02-25 ENCOUNTER — Other Ambulatory Visit: Payer: Self-pay | Admitting: Nurse Practitioner

## 2021-02-25 DIAGNOSIS — N10 Acute tubulo-interstitial nephritis: Secondary | ICD-10-CM

## 2021-02-25 LAB — URINE CULTURE

## 2021-02-25 MED ORDER — NITROFURANTOIN MONOHYD MACRO 100 MG PO CAPS: 100.0000 mg | ORAL_CAPSULE | Freq: Two times a day (BID) | ORAL | 0 refills | Status: AC

## 2021-03-07 ENCOUNTER — Other Ambulatory Visit: Payer: Self-pay | Admitting: Nurse Practitioner

## 2021-03-09 ENCOUNTER — Other Ambulatory Visit: Payer: Self-pay | Admitting: Nurse Practitioner

## 2021-03-10 ENCOUNTER — Other Ambulatory Visit: Payer: Self-pay

## 2021-03-10 DIAGNOSIS — N390 Urinary tract infection, site not specified: Secondary | ICD-10-CM

## 2021-03-10 NOTE — Telephone Encounter (Signed)
Cyclobenzaprine refill 

## 2021-03-14 ENCOUNTER — Other Ambulatory Visit: Payer: Self-pay

## 2021-03-14 DIAGNOSIS — N390 Urinary tract infection, site not specified: Secondary | ICD-10-CM | POA: Diagnosis not present

## 2021-03-16 LAB — URINE CULTURE

## 2021-03-23 ENCOUNTER — Encounter: Payer: Self-pay | Admitting: Orthopaedic Surgery

## 2021-03-23 ENCOUNTER — Ambulatory Visit (INDEPENDENT_AMBULATORY_CARE_PROVIDER_SITE_OTHER): Payer: Medicare Other | Admitting: Orthopaedic Surgery

## 2021-03-23 ENCOUNTER — Ambulatory Visit (INDEPENDENT_AMBULATORY_CARE_PROVIDER_SITE_OTHER): Payer: Medicare Other

## 2021-03-23 ENCOUNTER — Other Ambulatory Visit: Payer: Self-pay

## 2021-03-23 DIAGNOSIS — G8929 Other chronic pain: Secondary | ICD-10-CM

## 2021-03-23 DIAGNOSIS — M5441 Lumbago with sciatica, right side: Secondary | ICD-10-CM

## 2021-03-23 MED ORDER — GABAPENTIN 300 MG PO CAPS: 300.0000 mg | ORAL_CAPSULE | Freq: Every day | ORAL | 2 refills | Status: DC

## 2021-03-23 MED ORDER — METHYLPREDNISOLONE 4 MG PO TABS: ORAL_TABLET | ORAL | 0 refills | Status: DC

## 2021-03-23 NOTE — Progress Notes (Signed)
Office Visit Note   Patient: Ryan Craig           Date of Birth: [birth year]           MRN: [id number] Visit Date: 03/23/2021              Requested by: [name], [place],  [place] [id number] PCP: [name], FNP   Assessment & Plan: Visit Diagnoses:  1. Chronic bilateral low back pain with right-sided sciatica     Plan: He would definitely benefit from outpatient physical therapy with any modalities per the therapist to help decrease his right-sided low back pain and improve his function.  I am going to try a 6-day steroid taper as well as Neurontin.  I would like to see him back in 4 weeks to see how he is doing overall.  If there is no significant improvement, a MRI of the lumbar spine be warranted.  All questions and concerns were answered and addressed.  Follow-Up Instructions: Return in about 4 weeks (around 04/20/2021).   Orders:  Orders Placed This Encounter  Procedures  . XR Lumbar Spine 2-3 Views   Meds ordered this encounter  Medications  . methylPREDNISolone (MEDROL) 4 MG tablet    Sig: Medrol dose pack. Take as instructed    Dispense:  21 tablet    Refill:  0  . gabapentin (NEURONTIN) 300 MG capsule    Sig: Take 1 capsule (300 mg total) by mouth at bedtime.    Dispense:  30 capsule    Refill:  2      Procedures: No procedures performed   Clinical Data: No additional findings.   Subjective: Chief Complaint  Patient presents with  . Lower Back - Pain  The patient is listed as a new patient because I have not seen him in a long period of time.  We have actually seen him for his lumbar spine but is been a very long time ago.  He even saw Dr. Louanne Skye.  He has had several injections by Dr. Ernestina Patches in his spine.  He comes in with about a month and a half of right-sided low back pain that is radiating to the sciatic region and down his leg.  He is not diabetic.  He reports numbness and tingling but not constant.  It hurts  worse when he is rolling around in bed and getting up on his right side.  He denies any change in bowel or bladder function.  He denies any significant weakness.  He is 75 years old.  HPI  Review of Systems He currently denies any headache, chest pain, shortness of breath, fever, chills, nausea, vomiting  Objective: Vital Signs: There were no vitals taken for this visit.  Physical Exam He is alert and orient x3 and in no acute distress Ortho Exam Examination of his lumbar spine shows right-sided low back pain from the mid lumbar spine to the right side and into the sciatic region.  This hurts with flexion extension.  He does have a negative straight leg raise on the right side.  He has good strength and normal sensation in his right lower extremity today. Specialty Comments:  No specialty comments available.  Imaging: XR Lumbar Spine 2-3 Views  Result Date: 03/23/2021 2 views of the lumbar spine degenerative changes at multiple levels.    PMFS History: Patient Active Problem List   Diagnosis Date Noted  . HLD (hyperlipidemia) 07/23/2020  . Chest pain 07/22/2020  .  Chest pain of uncertain etiology   . Essential hypertension 01/22/2019  . Persistent cough 01/22/2019  . Right bundle branch block 11/21/2018  . Left anterior fascicular block 11/21/2018  . Former smoker 11/21/2018  . Obesity (BMI 30-39.9) 11/21/2018  . Need for vaccination 09/27/2018  . Osteoarthritis of left knee 12/22/2014  . Status post total left knee replacement 12/22/2014  . Acute medial meniscus tear of left knee 02/24/2014   Past Medical History:  Diagnosis Date  . Anemia    no on any meds  . Arthritis   . Chronic back pain    deteriorating  . Hyperlipidemia    was on Lipitor but was taken off 3-68yrs ago by medical md bc running liver enzymes up  . Hypertension    takes Amlodipine and Quinapril daily  . Joint pain   . Joint swelling     Family History  Problem Relation Age of Onset  . Heart  attack Mother   . Cancer Maternal Aunt   . Cancer Paternal Aunt   . Cancer Cousin     Past Surgical History:  Procedure Laterality Date  . arthroscopic knee surgery Right   . COLONOSCOPY    . INGUINAL HERNIA REPAIR Bilateral    x 4  . JOINT REPLACEMENT Right 6 yrs ago   knee  . KNEE ARTHROSCOPY Left 02/24/2014   Procedure: LEFT KNEE ARTHROSCOPY WITH PARTIAL MEDIAL MENISCECTOMY;  Surgeon: Mcarthur Rossetti, MD;  Location: [facility];  Service: Orthopedics;  Laterality: Left;  . LEFT HEART CATH AND CORONARY ANGIOGRAPHY N/A 07/22/2020   Procedure: LEFT HEART CATH AND CORONARY ANGIOGRAPHY;  Surgeon: Burnell Blanks, MD;  Location: [facility];  Service: Cardiovascular;  Laterality: N/A;  . TOTAL KNEE ARTHROPLASTY Left 12/22/2014   Procedure: LEFT TOTAL KNEE ARTHROPLASTY;  Surgeon: Mcarthur Rossetti, MD;  Location: [facility];  Service: Orthopedics;  Laterality: Left;   Social History   Occupational History  . Occupation: retired  Tobacco Use  . Smoking status: Former Research scientist (life sciences)  . Smokeless tobacco: Former Systems developer  . Tobacco comment: quit smoking about 6-7ytrs ago  Vaping Use  . Vaping Use: Never used  Substance and Sexual Activity  . Alcohol use: No    Comment: drank over 40 years ago  . Drug use: No  . Sexual activity: Yes

## 2021-03-28 ENCOUNTER — Ambulatory Visit (INDEPENDENT_AMBULATORY_CARE_PROVIDER_SITE_OTHER): Payer: Medicare Other | Admitting: Rehabilitative and Restorative Service Providers"

## 2021-03-28 ENCOUNTER — Encounter: Payer: Self-pay | Admitting: Rehabilitative and Restorative Service Providers"

## 2021-03-28 ENCOUNTER — Other Ambulatory Visit: Payer: Self-pay

## 2021-03-28 DIAGNOSIS — M5441 Lumbago with sciatica, right side: Secondary | ICD-10-CM | POA: Diagnosis not present

## 2021-03-28 DIAGNOSIS — G8929 Other chronic pain: Secondary | ICD-10-CM

## 2021-03-28 DIAGNOSIS — R262 Difficulty in walking, not elsewhere classified: Secondary | ICD-10-CM | POA: Diagnosis not present

## 2021-03-28 DIAGNOSIS — M6281 Muscle weakness (generalized): Secondary | ICD-10-CM | POA: Diagnosis not present

## 2021-03-28 DIAGNOSIS — R293 Abnormal posture: Secondary | ICD-10-CM

## 2021-03-28 NOTE — Patient Instructions (Signed)
Access Code: [email or web address] URL: [email or web address] Date: 03/28/2021 Prepared by: Scot Jun  Exercises Supine Lower Trunk Rotation - 2 x daily - 7 x weekly - 1 sets - 5 reps - 15 hold Supine Bridge - 2 x daily - 7 x weekly - 2 sets - 10 reps - 2 hold Hooklying Single Knee to Chest Stretch - 2 x daily - 7 x weekly - 1 sets - 5 reps - 15 hold Left Standing Lateral Shift Correction at Wall - Repetitions - 1 x daily - 7 x weekly - 1 sets - 10-15 reps - 2-3 hold

## 2021-03-28 NOTE — Therapy (Signed)
[facility] Therapy [address] [place], [place], [address] Phone: [phone]   Fax:  [phone]  Physical Therapy Evaluation  Patient Details  Name: Ryan Craig MRN: [id number] Date of Birth: [birth year] Referring Provider (PT): Dr. Jean Rosenthal   Encounter Date: 03/28/2021   PT End of Session - 03/28/21 0828    Visit Number 1    Number of Visits 20    Date for PT Re-Evaluation 06/06/21    Authorization Type Medicare, AETNA    Progress Note Due on Visit 10    PT Start Time 0835    PT Stop Time 0924    PT Time Calculation (min) 49 min    Activity Tolerance Patient tolerated treatment well    Behavior During Therapy [facility] for tasks assessed/performed           Past Medical History:  Diagnosis Date  . Anemia    no on any meds  . Arthritis   . Chronic back pain    deteriorating  . Hyperlipidemia    was on Lipitor but was taken off 3-89yrs ago by medical md bc running liver enzymes up  . Hypertension    takes Amlodipine and Quinapril daily  . Joint pain   . Joint swelling     Past Surgical History:  Procedure Laterality Date  . arthroscopic knee surgery Right   . COLONOSCOPY    . INGUINAL HERNIA REPAIR Bilateral    x 4  . JOINT REPLACEMENT Right 6 yrs ago   knee  . KNEE ARTHROSCOPY Left 02/24/2014   Procedure: LEFT KNEE ARTHROSCOPY WITH PARTIAL MEDIAL MENISCECTOMY;  Surgeon: Mcarthur Rossetti, MD;  Location: [place];  Service: Orthopedics;  Laterality: Left;  . LEFT HEART CATH AND CORONARY ANGIOGRAPHY N/A 07/22/2020   Procedure: LEFT HEART CATH AND CORONARY ANGIOGRAPHY;  Surgeon: Burnell Blanks, MD;  Location: [place] [facility];  Service: Cardiovascular;  Laterality: N/A;  . TOTAL KNEE ARTHROPLASTY Left 12/22/2014   Procedure: LEFT TOTAL KNEE ARTHROPLASTY;  Surgeon: Mcarthur Rossetti, MD;  Location: [place];  Service: Orthopedics;  Laterality: Left;    There were no vitals filed for this visit.    Subjective  Assessment - 03/28/21 0828    Subjective Complaints of low back pain, chronic insidious onset.  Pt. indicated medicine has helped symptoms but has led to some mild increase in cramps (previously had).  Pt. indicated off and on trouble for years but more increase of symptoms in last few months.  Complaints in Rt side of back and into Rt leg as well (pins/needles).  History of injections in back in past that had helped.  Trouble c standing, walking, house/yard work.  Had more trouble c sleeping prior to medicine but improved some now.    Patient is accompained by: Family member    Pertinent History History of bilateral knee replacements, hyperlipidemia    Limitations Standing;Walking    How long can you sit comfortably? a few hours    How long can you stand comfortably? 10-15 mins    Patient Stated Goals Reduce pain to get back to normal activity during day.    Currently in Pain? Yes    Pain Score 5    pain at worst 7/10   Pain Location Back    Pain Orientation Right;Lower    Pain Descriptors / Indicators Pins and needles;Aching;Dull;Sore;Sharp    Pain Type Chronic pain    Pain Radiating Towards Rt hip/leg  (unsure of if it goes past  knee)    Pain Onset More than a month ago    Pain Frequency Constant    Aggravating Factors  walking prolonged, standing prolonged, housework (cleaning), yard work    Pain Relieving Factors Medicine, lying down, hot water bottle at times.              [facility] PT Assessment - 03/28/21 0001      Assessment   Medical Diagnosis Chronic Low back pain    Referring Provider (PT) Dr. Jean Rosenthal    Onset Date/Surgical Date 01/21/21      Precautions   Precautions None      Balance Screen   Has the patient fallen in the past 6 months No    Has the patient had a decrease in activity level because of a fear of falling?  No    Is the patient reluctant to leave their home because of a fear of falling?  No      Home Environment   Additional Comments 1 step  into the house       Prior Function   Level of Independence Independent    Vocation Retired      Associate Professor   Overall Cognitive Status Within Functional Limits for tasks assessed      Observation/Other Assessments   Focus on Therapeutic Outcomes (FOTO)  Intake 72%, predicted 76%      Posture/Postural Control   Posture Comments Lt trunk lateral shift c mild forward trunk lean, reduced lumbar lordosis      AROM   Overall AROM Comments Uncertain of true directional preference response for Rt leg symptoms at this time due to variable response.  Lateral shift correction manually produced back and leg symptoms c increased pressure applied.    AROM Assessment Site Lumbar;Hip    Right/Left Hip Left;Right    Lumbar Extension < 25% ERP back/Rt hip/thigh, repeated 5x with various response (no change, worsened ERP each time)    Lumbar - Right Side Bend Painful, movement to mid thigh    Lumbar - Left Side Bend Painful, movement to femoral epicondyle      PROM   Overall PROM Comments Pain noted in Rt lumbar, posterior hip c Rt hip flexion to 100 degrees, Rt hip ER to 30 degrees, Rt hip IR 15 degrees (measured in supine)      Strength   Strength Assessment Site Hip;Knee;Ankle    Right/Left Hip Left;Right    Right Hip Flexion 5/5    Left Hip Flexion 5/5    Right/Left Knee Left;Right    Right Knee Flexion 5/5    Right Knee Extension 5/5    Left Knee Flexion 5/5    Left Knee Extension 5/5    Right/Left Ankle Left;Right    Right Ankle Dorsiflexion 5/5    Left Ankle Dorsiflexion 4+/5      Palpation   Spinal mobility Assess next visit    Palpation comment Tenderness in Rt QL, Rt glute max, med      Special Tests   Other special tests (+) back pain c Rt slump (denied leg symptoms), + FABER on Rt      Transfers   Comments Poor control in supine to sit to supine transfers c pain noted throughout.  Cues given to help incorporate log rolling supine to sit but fair adherence to cues and pain  still noted in Rt back/leg.  Objective measurements completed on examination: See above findings.       [place] [facility] PT Treatment/Exercise - 03/28/21 0001      Exercises   Exercises Other Exercises;Lumbar    Other Exercises  HEP instruction/performance c cues for techniques, handout provided.  Trial set performed of each for comprehension and symptom assessment.  Exteded time and education given for cues to avoid pain response in stretching, keeping movement to tightness level only to prevent exacerbation of symptoms.  HEP consisting of SKC, supine lumbar trunk rotation, supine bridge, lateral shift correction at wall (wall on Lt side)      Manual Therapy   Manual therapy comments Manual lateral shift correction (Rt trunk movement)                  PT Education - 03/28/21 0828    Education Details HEP, POC.  Additional time spent on education on distance of stretching (avoiding pain increase c movement, just focused on movement to tightness)    Person(s) Educated Patient    Methods Explanation;Demonstration;Verbal cues;Handout    Comprehension Verbalized understanding;Returned demonstration            PT Short Term Goals - 03/28/21 0829      PT SHORT TERM GOAL #1   Title Patient will be independent with HEP     Time 3    Period Weeks    Status New    Target Date 06/06/21             PT Long Term Goals - 03/28/21 0829      PT LONG TERM GOAL #1   Title independent with all HEP issued as of last visit    Time 10    Period Weeks    Status New    Target Date 06/06/21      PT LONG TERM GOAL #2   Title Patient will demonstrate/report pain at worst less than or equal to 2/10 to facilitate minimal limitation in daily activity secondary to pain symptoms.    Time 10    Period Weeks    Status New    Target Date 06/06/21      PT LONG TERM GOAL #3   Title Pt. will demonstrate FOTO outcome > or = 76 to indicated reduced disability  due to condition.    Time 10    Period Weeks    Status New    Target Date 06/06/21      PT LONG TERM GOAL #4   Title Pt. will demonstrate lumbar ext > or = 50% WFL AROM to facilitate upright standing, walking at PLOF for house/yard activity.    Time 10    Period Weeks    Status New    Target Date 06/06/21      PT LONG TERM GOAL #5   Title Pt. will demonstrate stnading posture s lateral shift for improved posture for standing/walking. 1    Time 10    Period Weeks    Status New    Target Date 06/06/21      Additional Long Term Goals   Additional Long Term Goals Yes      PT LONG TERM GOAL #6   Title Pt. will demonstrate supine to sit to supine transfers c log rolling for improved positioning for daily mobility.    Time 10    Period Weeks    Status New    Target Date 06/06/21      PT LONG TERM GOAL #7  Title Pt. will demonstrate/indicate ability to perform house/yard work c mild difficulty or less (see FOTO assessment reporting, self reporting).    Time 10    Period Weeks    Status New    Target Date 06/06/21                  Plan - 03/28/21 0830    Clinical Impression Statement Patient is a 75 y.o. who comes to clinic with complaints of low back pain with mobility, strength and movement coordination deficits that impair their ability to perform usual daily and recreational functional activities without increase difficulty/symptoms at this time.  Patient to benefit from skilled PT services to address impairments and limitations to improve to previous level of function without restriction secondary to condition.    Personal Factors and Comorbidities Other;Time since onset of injury/illness/exacerbation   bilateral knee replacmeents, long onset of back symptoms history   Examination-Activity Limitations Sleep;Squat;Bend;Stairs;Carry;Stand;Transfers;Lift;Locomotion Level;Bed Mobility    Examination-Participation Restrictions Community Activity;Yard Work;Cleaning;Meal  Prep;Laundry    Stability/Clinical Decision Making Stable/Uncomplicated    Clinical Decision Making Low    Rehab Potential Good    PT Frequency 2x / week    PT Duration Other (comment)   10 weeks   PT Treatment/Interventions ADLs/Self Care Home Management;Cryotherapy;Electrical Stimulation;Iontophoresis 4mg /ml Dexamethasone;Moist Heat;Traction;Balance training;Therapeutic exercise;Therapeutic activities;Functional mobility training;Stair training;Gait training;Ultrasound;Neuromuscular re-education;DME Instruction;Patient/family education;Passive range of motion;Spinal Manipulations;Joint Manipulations;Dry needling;Taping;Manual techniques    PT Next Visit Plan Review existing HEP knowledge.  Continue correction of lateral shift, improve lumbar and hip mobility, reassess centralization possibilities.    PT Home Exercise Plan [email or web address]    Consulted and Agree with Plan of Care Patient           Patient will benefit from skilled therapeutic intervention in order to improve the following deficits and impairments:  Decreased endurance,Hypomobility,Pain,Postural dysfunction,Impaired perceived functional ability,Improper body mechanics,Impaired flexibility,Decreased range of motion,Difficulty walking,Decreased mobility,Decreased strength,Decreased activity tolerance  Visit Diagnosis: Chronic right-sided low back pain with right-sided sciatica - Plan: PT plan of care cert/re-cert  Muscle weakness (generalized) - Plan: PT plan of care cert/re-cert  Difficulty in walking, not elsewhere classified - Plan: PT plan of care cert/re-cert  Abnormal posture - Plan: PT plan of care cert/re-cert     Problem List Patient Active Problem List   Diagnosis Date Noted  . HLD (hyperlipidemia) 07/23/2020  . Chest pain 07/22/2020  . Chest pain of uncertain etiology   . Essential hypertension 01/22/2019  . Persistent cough 01/22/2019  . Right bundle branch block 11/21/2018  . Left anterior fascicular block  11/21/2018  . Former smoker 11/21/2018  . Obesity (BMI 30-39.9) 11/21/2018  . Need for vaccination 09/27/2018  . Osteoarthritis of left knee 12/22/2014  . Status post total left knee replacement 12/22/2014  . Acute medial meniscus tear of left knee 02/24/2014    Scot Jun, PT, DPT, OCS, ATC 03/28/21  9:41 AM    [facility] Physical Therapy [address] [place], [place], [address] Phone: [phone]   Fax:  [phone]  Name: Ryan Craig MRN: [id number] Date of Birth: 1946-01-30

## 2021-03-30 ENCOUNTER — Encounter: Payer: Self-pay | Admitting: Physical Therapy

## 2021-03-30 ENCOUNTER — Ambulatory Visit (INDEPENDENT_AMBULATORY_CARE_PROVIDER_SITE_OTHER): Payer: Medicare Other | Admitting: Physical Therapy

## 2021-03-30 ENCOUNTER — Other Ambulatory Visit: Payer: Self-pay

## 2021-03-30 DIAGNOSIS — M6281 Muscle weakness (generalized): Secondary | ICD-10-CM | POA: Diagnosis not present

## 2021-03-30 DIAGNOSIS — R293 Abnormal posture: Secondary | ICD-10-CM

## 2021-03-30 DIAGNOSIS — G8929 Other chronic pain: Secondary | ICD-10-CM | POA: Diagnosis not present

## 2021-03-30 DIAGNOSIS — M5441 Lumbago with sciatica, right side: Secondary | ICD-10-CM

## 2021-03-30 DIAGNOSIS — R262 Difficulty in walking, not elsewhere classified: Secondary | ICD-10-CM | POA: Diagnosis not present

## 2021-03-30 NOTE — Therapy (Signed)
[facility] Physical Therapy [address] [place], [place], [address] Phone: [phone]   Fax:  [phone]  Physical Therapy Treatment  Patient Details  Name: Ryan Craig MRN: [id number] Date of Birth: 11/28/1945 Referring Provider (PT): Dr. Jean Rosenthal   Encounter Date: 03/30/2021   PT End of Session - 03/30/21 1054    Visit Number 2    Number of Visits 20    Date for PT Re-Evaluation 06/06/21    Authorization Type Medicare, AETNA    Progress Note Due on Visit 10    PT Start Time 1015    PT Stop Time 1053    PT Time Calculation (min) 38 min    Activity Tolerance Patient tolerated treatment well    Behavior During Therapy [facility] for tasks assessed/performed           Past Medical History:  Diagnosis Date  . Anemia    no on any meds  . Arthritis   . Chronic back pain    deteriorating  . Hyperlipidemia    was on Lipitor but was taken off 3-31yrs ago by medical md bc running liver enzymes up  . Hypertension    takes Amlodipine and Quinapril daily  . Joint pain   . Joint swelling     Past Surgical History:  Procedure Laterality Date  . arthroscopic knee surgery Right   . COLONOSCOPY    . INGUINAL HERNIA REPAIR Bilateral    x 4  . JOINT REPLACEMENT Right 6 yrs ago   knee  . KNEE ARTHROSCOPY Left 02/24/2014   Procedure: LEFT KNEE ARTHROSCOPY WITH PARTIAL MEDIAL MENISCECTOMY;  Surgeon: Mcarthur Rossetti, MD;  Location: [place];  Service: Orthopedics;  Laterality: Left;  . LEFT HEART CATH AND CORONARY ANGIOGRAPHY N/A 07/22/2020   Procedure: LEFT HEART CATH AND CORONARY ANGIOGRAPHY;  Surgeon: Burnell Blanks, MD;  Location: [place] [facility];  Service: Cardiovascular;  Laterality: N/A;  . TOTAL KNEE ARTHROPLASTY Left 12/22/2014   Procedure: LEFT TOTAL KNEE ARTHROPLASTY;  Surgeon: Mcarthur Rossetti, MD;  Location: [place];  Service: Orthopedics;  Laterality: Left;    There were no vitals filed for this visit.   Subjective  Assessment - 03/30/21 1044    Subjective Pt arriving today reporting 6-7/10 pain in his low back.    Pertinent History History of bilateral knee replacements, hyperlipidemia    Limitations Standing;Walking    How long can you sit comfortably? a few hours    How long can you stand comfortably? 10-15 mins    Patient Stated Goals Reduce pain to get back to normal activity during day.    Currently in Pain? Yes    Pain Score 7     Pain Location Back    Pain Orientation Right;Lower    Pain Descriptors / Indicators Aching;Burning;Sore    Pain Type Chronic pain    Pain Onset More than a month ago              [facility] PT Assessment - 03/30/21 0001      Posture/Postural Control   Posture Comments Lt trunk lateral shift c mild forward trunk lean, reduced lumbar lordosis                         [facility] Adult PT Treatment/Exercise - 03/30/21 0001      Exercises   Exercises Lumbar      Lumbar Exercises: Stretches   Active Hamstring Stretch Left;Right;3 reps;20 seconds  Active Hamstring Stretch Limitations using strap in supine    Lower Trunk Rotation 3 reps;20 seconds    Hip Flexor Stretch 2 reps;20 seconds    Other Lumbar Stretch Exercise lateral shift correction x 10 holding 5 seconds each, trunk extension x 5 holding 5 seconds each      Lumbar Exercises: Aerobic   Nustep L4 x 5 minutes      Lumbar Exercises: Supine   Bridge with Ball Squeeze 10 reps;5 seconds    Straight Leg Raise 10 reps    Other Supine Lumbar Exercises clam shells with green theraband                    PT Short Term Goals - 03/30/21 1038      PT SHORT TERM GOAL #1   Title Patient will be independent with HEP     Baseline reviewed HEP at first visit with verbal corrections for tehnique    Status On-going             PT Long Term Goals - 03/30/21 1039      PT LONG TERM GOAL #1   Title independent with all HEP issued as of last visit      PT LONG TERM GOAL #2   Title  Patient will demonstrate/report pain at worst less than or equal to 2/10 to facilitate minimal limitation in daily activity secondary to pain symptoms.    Status On-going      PT LONG TERM GOAL #3   Title Pt. will demonstrate FOTO outcome > or = 76 to indicated reduced disability due to condition.    Status New      PT LONG TERM GOAL #4   Title Pt. will demonstrate lumbar ext > or = 50% WFL AROM to facilitate upright standing, walking at PLOF for house/yard activity.    Status On-going      PT LONG TERM GOAL #5   Title Pt. will demonstrate stnading posture s lateral shift for improved posture for standing/walking. 1      PT LONG TERM GOAL #6   Title Pt. will demonstrate supine to sit to supine transfers c log rolling for improved positioning for daily mobility.    Status On-going      PT LONG TERM GOAL #7   Title Pt. will demonstrate/indicate ability to perform house/yard work c mild difficulty or less (see FOTO assessment reporting, self reporting).    Status On-going                 Plan - 03/30/21 1029    Clinical Impression Statement Pt arriving to therpay to therapy today with 6-7/10 pain in his low back. Pt and wife reporting compliance with his initial HEP. PT tolerating exercises well and reviewing pt's HEP with rest breaks required due to muscle fatigue. Continue skilled PT to maximize function.    Personal Factors and Comorbidities Other;Time since onset of injury/illness/exacerbation    Examination-Activity Limitations Sleep;Squat;Bend;Stairs;Carry;Stand;Transfers;Lift;Locomotion Level;Bed Mobility    Examination-Participation Restrictions Community Activity;Yard Work;Cleaning;Meal Prep;Laundry    Stability/Clinical Decision Making Stable/Uncomplicated    Rehab Potential Good    PT Frequency 2x / week    PT Duration Other (comment)    PT Treatment/Interventions ADLs/Self Care Home Management;Cryotherapy;Electrical Stimulation;Iontophoresis 4mg /ml Dexamethasone;Moist  Heat;Traction;Balance training;Therapeutic exercise;Therapeutic activities;Functional mobility training;Stair training;Gait training;Ultrasound;Neuromuscular re-education;DME Instruction;Patient/family education;Passive range of motion;Spinal Manipulations;Joint Manipulations;Dry needling;Taping;Manual techniques    PT Next Visit Plan Continue correction of lateral shift, improve lumbar and hip mobility, reassess  centralization possibilities.    PT Home Exercise Plan [email or web address]    Consulted and Agree with Plan of Care Patient           Patient will benefit from skilled therapeutic intervention in order to improve the following deficits and impairments:  Decreased endurance,Hypomobility,Pain,Postural dysfunction,Impaired perceived functional ability,Improper body mechanics,Impaired flexibility,Decreased range of motion,Difficulty walking,Decreased mobility,Decreased strength,Decreased activity tolerance  Visit Diagnosis: Chronic right-sided low back pain with right-sided sciatica  Muscle weakness (generalized)  Difficulty in walking, not elsewhere classified  Abnormal posture     Problem List Patient Active Problem List   Diagnosis Date Noted  . HLD (hyperlipidemia) 07/23/2020  . Chest pain 07/22/2020  . Chest pain of uncertain etiology   . Essential hypertension 01/22/2019  . Persistent cough 01/22/2019  . Right bundle branch block 11/21/2018  . Left anterior fascicular block 11/21/2018  . Former smoker 11/21/2018  . Obesity (BMI 30-39.9) 11/21/2018  . Need for vaccination 09/27/2018  . Osteoarthritis of left knee 12/22/2014  . Status post total left knee replacement 12/22/2014  . Acute medial meniscus tear of left knee 02/24/2014    Oretha Caprice, PT, MPT 03/30/2021, 10:57 AM  [facility] Physical Therapy [address] [place], [place], [address] Phone: [phone]   Fax:  [phone]  Name: Ryan Craig MRN: [id number] Date of Birth:  1946-06-22

## 2021-03-31 ENCOUNTER — Other Ambulatory Visit: Payer: Self-pay | Admitting: Cardiology

## 2021-03-31 ENCOUNTER — Other Ambulatory Visit: Payer: Self-pay | Admitting: Nurse Practitioner

## 2021-04-06 ENCOUNTER — Encounter: Payer: Self-pay | Admitting: Rehabilitative and Restorative Service Providers"

## 2021-04-06 ENCOUNTER — Other Ambulatory Visit: Payer: Self-pay

## 2021-04-06 ENCOUNTER — Ambulatory Visit (INDEPENDENT_AMBULATORY_CARE_PROVIDER_SITE_OTHER): Payer: Medicare Other | Admitting: Rehabilitative and Restorative Service Providers"

## 2021-04-06 DIAGNOSIS — R293 Abnormal posture: Secondary | ICD-10-CM

## 2021-04-06 DIAGNOSIS — R262 Difficulty in walking, not elsewhere classified: Secondary | ICD-10-CM

## 2021-04-06 DIAGNOSIS — M6281 Muscle weakness (generalized): Secondary | ICD-10-CM

## 2021-04-06 DIAGNOSIS — M5441 Lumbago with sciatica, right side: Secondary | ICD-10-CM

## 2021-04-06 DIAGNOSIS — G8929 Other chronic pain: Secondary | ICD-10-CM | POA: Diagnosis not present

## 2021-04-06 NOTE — Patient Instructions (Signed)
Access Code: [email or web address] URL: [email or web address] Date: 04/06/2021 Prepared by: Scot Jun  Exercises Supine Lower Trunk Rotation - 2-3 x daily - 7 x weekly - 1 sets - 5 reps - 15 hold Supine Bridge - 2-3 x daily - 7 x weekly - 2 sets - 10 reps - 2 hold Hooklying Single Knee to Chest Stretch - 2-3 x daily - 7 x weekly - 1 sets - 5 reps - 15 hold Left Standing Lateral Shift Correction at Wall - Repetitions - 2-3 x daily - 7 x weekly - 1 sets - 10-15 reps - 2-3 hold Prone Press Up On Elbows - 2-3 x daily - 7 x weekly - 2 sets - 10 reps - 5 hold

## 2021-04-06 NOTE — Therapy (Signed)
[facility] [address] [place], [place], [address] Phone: [phone]   Fax:  [phone]  Physical Therapy Treatment  Patient Details  Name: Ryan Craig MRN: [id number] Date of Birth: [birth year] Referring Provider (PT): Dr. Jean Rosenthal   Encounter Date: 04/06/2021   PT End of Session - 04/06/21 0833     Visit Number 3    Number of Visits 20    Date for PT Re-Evaluation 06/06/21    Authorization Type Medicare, AETNA    Progress Note Due on Visit 10    PT Start Time 0835    PT Stop Time 0915    PT Time Calculation (min) 40 min    Activity Tolerance Patient tolerated treatment well    Behavior During Therapy [facility] for tasks assessed/performed             Past Medical History:  Diagnosis Date   Anemia    no on any meds   Arthritis    Chronic back pain    deteriorating   Hyperlipidemia    was on Lipitor but was taken off 3-75yrs ago by medical md bc running liver enzymes up   Hypertension    takes Amlodipine and Quinapril daily   Joint pain    Joint swelling     Past Surgical History:  Procedure Laterality Date   arthroscopic knee surgery Right    COLONOSCOPY     INGUINAL HERNIA REPAIR Bilateral    x 4   JOINT REPLACEMENT Right 6 yrs ago   knee   KNEE ARTHROSCOPY Left 02/24/2014   Procedure: LEFT KNEE ARTHROSCOPY WITH PARTIAL MEDIAL MENISCECTOMY;  Surgeon: Mcarthur Rossetti, MD;  Location: [place];  Service: Orthopedics;  Laterality: Left;   LEFT HEART CATH AND CORONARY ANGIOGRAPHY N/A 07/22/2020   Procedure: LEFT HEART CATH AND CORONARY ANGIOGRAPHY;  Surgeon: Burnell Blanks, MD;  Location: [place] [facility];  Service: Cardiovascular;  Laterality: N/A;   TOTAL KNEE ARTHROPLASTY Left 12/22/2014   Procedure: LEFT TOTAL KNEE ARTHROPLASTY;  Surgeon: Mcarthur Rossetti, MD;  Location: [place];  Service: Orthopedics;  Laterality: Left;    There were no vitals filed for this visit.   Subjective Assessment -  04/06/21 0841     Subjective Pt. indicated complaints 2-3/10 upon arrival today.  Pt. indicated chief complaint of pain c walking/standing still there.  Pt. indicated doing some better, reported bending in yard work was better.    Pertinent History History of bilateral knee replacements, hyperlipidemia    Limitations Standing;Walking    How long can you sit comfortably? a few hours    How long can you stand comfortably? 10-15 mins    Patient Stated Goals Reduce pain to get back to normal activity during day.    Currently in Pain? Yes    Pain Score 3     Pain Location Back    Pain Orientation Right;Lower    Pain Descriptors / Indicators Aching;Burning;Sore    Pain Type Chronic pain    Pain Radiating Towards Rt hip/leg    Pain Onset More than a month ago    Pain Frequency Constant    Aggravating Factors  standing, walking prolonged    Pain Relieving Factors unsure reporting today                [facility] PT Assessment - 04/06/21 0001       Assessment   Medical Diagnosis Chronic Low back pain    Referring Provider (PT) Dr. Harrell Gave  Blackman    Onset Date/Surgical Date 01/21/21      AROM   Lumbar Extension 50% ERP                           [facility] Adult PT Treatment/Exercise - 04/06/21 0001       Exercises   Other Exercises  Additional time spent on review of exercise techniques, positioning with patient and wife.      Lumbar Exercises: Stretches   Other Lumbar Stretch Exercise lateral shift correction (wall at Lt) 5 sec hold x 15      Lumbar Exercises: Aerobic   Nustep Lvl 5 UE/LE 7 mins      Lumbar Exercises: Prone   Other Prone Lumbar Exercises prone press up on elbows 5 sec hold x 15      Manual Therapy   Manual therapy comments cPA G3 L2-L5 15-20 sec bouts                    PT Education - 04/06/21 0906     Education Details HEP progression.    Person(s) Educated Patient;Spouse    Methods Explanation;Demonstration;Verbal  cues;Handout    Comprehension Returned demonstration;Verbalized understanding              PT Short Term Goals - 03/30/21 1038       PT SHORT TERM GOAL #1   Title Patient will be independent with HEP     Baseline reviewed HEP at first visit with verbal corrections for tehnique    Status On-going               PT Long Term Goals - 03/30/21 1039       PT LONG TERM GOAL #1   Title independent with all HEP issued as of last visit      PT LONG TERM GOAL #2   Title Patient will demonstrate/report pain at worst less than or equal to 2/10 to facilitate minimal limitation in daily activity secondary to pain symptoms.    Status On-going      PT LONG TERM GOAL #3   Title Pt. will demonstrate FOTO outcome > or = 76 to indicated reduced disability due to condition.    Status New      PT LONG TERM GOAL #4   Title Pt. will demonstrate lumbar ext > or = 50% WFL AROM to facilitate upright standing, walking at PLOF for house/yard activity.    Status On-going      PT LONG TERM GOAL #5   Title Pt. will demonstrate stnading posture s lateral shift for improved posture for standing/walking. 1      PT LONG TERM GOAL #6   Title Pt. will demonstrate supine to sit to supine transfers c log rolling for improved positioning for daily mobility.    Status On-going      PT LONG TERM GOAL #7   Title Pt. will demonstrate/indicate ability to perform house/yard work c mild difficulty or less (see FOTO assessment reporting, self reporting).    Status On-going                   Plan - 04/06/21 0912     Clinical Impression Statement Pt. reported marked improvement in standing tolerance, mobility tolerance after manual intervention for lumbar spine and prone press on elbows.  Lateral shift to Lt still noted but mobility in correction improving with less hypomobility noted.    Personal  Factors and Comorbidities Other;Time since onset of injury/illness/exacerbation    Examination-Activity  Limitations Sleep;Squat;Bend;Stairs;Carry;Stand;Transfers;Lift;Locomotion Level;Bed Mobility    Examination-Participation Restrictions Community Activity;Yard Work;Cleaning;Meal Prep;Laundry    Stability/Clinical Decision Making Stable/Uncomplicated    Rehab Potential Good    PT Frequency 2x / week    PT Duration Other (comment)    PT Treatment/Interventions ADLs/Self Care Home Management;Cryotherapy;Electrical Stimulation;Iontophoresis 4mg /ml Dexamethasone;Moist Heat;Traction;Balance training;Therapeutic exercise;Therapeutic activities;Functional mobility training;Stair training;Gait training;Ultrasound;Neuromuscular re-education;DME Instruction;Patient/family education;Passive range of motion;Spinal Manipulations;Joint Manipulations;Dry needling;Taping;Manual techniques    PT Next Visit Plan Manual mobilizations in prone for extension mobility, review press up activity.    PT Home Exercise Plan [id number]    Consulted and Agree with Plan of Care Patient             Patient will benefit from skilled therapeutic intervention in order to improve the following deficits and impairments:  Decreased endurance, Hypomobility, Pain, Postural dysfunction, Impaired perceived functional ability, Improper body mechanics, Impaired flexibility, Decreased range of motion, Difficulty walking, Decreased mobility, Decreased strength, Decreased activity tolerance  Visit Diagnosis: Chronic right-sided low back pain with right-sided sciatica  Muscle weakness (generalized)  Difficulty in walking, not elsewhere classified  Abnormal posture     Problem List Patient Active Problem List   Diagnosis Date Noted   HLD (hyperlipidemia) 07/23/2020   Chest pain 07/22/2020   Chest pain of uncertain etiology    Essential hypertension 01/22/2019   Persistent cough 01/22/2019   Right bundle branch block 11/21/2018   Left anterior fascicular block 11/21/2018   Former smoker 11/21/2018   Obesity (BMI 30-39.9)  11/21/2018   Need for vaccination 09/27/2018   Osteoarthritis of left knee 12/22/2014   Status post total left knee replacement 12/22/2014   Acute medial meniscus tear of left knee 02/24/2014    Scot Jun, PT, DPT, OCS, ATC 04/06/21  9:15 AM    [facility] [address] [place], [place], [address] Phone: [phone]   Fax:  [phone]  Name: Deral Schellenberg MRN: [id number] Date of Birth: 1946-01-18

## 2021-04-08 ENCOUNTER — Ambulatory Visit (INDEPENDENT_AMBULATORY_CARE_PROVIDER_SITE_OTHER): Payer: Medicare Other | Admitting: Rehabilitative and Restorative Service Providers"

## 2021-04-08 ENCOUNTER — Encounter: Payer: Self-pay | Admitting: Rehabilitative and Restorative Service Providers"

## 2021-04-08 ENCOUNTER — Other Ambulatory Visit: Payer: Self-pay

## 2021-04-08 DIAGNOSIS — G8929 Other chronic pain: Secondary | ICD-10-CM | POA: Diagnosis not present

## 2021-04-08 DIAGNOSIS — M6281 Muscle weakness (generalized): Secondary | ICD-10-CM

## 2021-04-08 DIAGNOSIS — M5441 Lumbago with sciatica, right side: Secondary | ICD-10-CM | POA: Diagnosis not present

## 2021-04-08 DIAGNOSIS — R293 Abnormal posture: Secondary | ICD-10-CM

## 2021-04-08 DIAGNOSIS — R262 Difficulty in walking, not elsewhere classified: Secondary | ICD-10-CM

## 2021-04-08 NOTE — Therapy (Signed)
[facility] Physical Therapy [address] [place], [place], [address] Phone: [phone]   Fax:  [phone]  Physical Therapy Treatment  Patient Details  Name: Ryan Craig MRN: [id number] Date of Birth: [birth year] Referring Provider (PT): Dr. Jean Rosenthal   Encounter Date: 04/08/2021   PT End of Session - 04/08/21 0846     Visit Number 4    Number of Visits 20    Date for PT Re-Evaluation 06/06/21    Authorization Type Medicare, AETNA    Progress Note Due on Visit 10    PT Start Time 0843    PT Stop Time 0922    PT Time Calculation (min) 39 min    Activity Tolerance Patient tolerated treatment well    Behavior During Therapy [facility] for tasks assessed/performed             Past Medical History:  Diagnosis Date   Anemia    no on any meds   Arthritis    Chronic back pain    deteriorating   Hyperlipidemia    was on Lipitor but was taken off 3-7yrs ago by medical md bc running liver enzymes up   Hypertension    takes Amlodipine and Quinapril daily   Joint pain    Joint swelling     Past Surgical History:  Procedure Laterality Date   arthroscopic knee surgery Right    COLONOSCOPY     INGUINAL HERNIA REPAIR Bilateral    x 4   JOINT REPLACEMENT Right 6 yrs ago   knee   KNEE ARTHROSCOPY Left 02/24/2014   Procedure: LEFT KNEE ARTHROSCOPY WITH PARTIAL MEDIAL MENISCECTOMY;  Surgeon: Mcarthur Rossetti, MD;  Location: [place];  Service: Orthopedics;  Laterality: Left;   LEFT HEART CATH AND CORONARY ANGIOGRAPHY N/A 07/22/2020   Procedure: LEFT HEART CATH AND CORONARY ANGIOGRAPHY;  Surgeon: Burnell Blanks, MD;  Location: [place] [facility];  Service: Cardiovascular;  Laterality: N/A;   TOTAL KNEE ARTHROPLASTY Left 12/22/2014   Procedure: LEFT TOTAL KNEE ARTHROPLASTY;  Surgeon: Mcarthur Rossetti, MD;  Location: [place];  Service: Orthopedics;  Laterality: Left;    There were no vitals filed for this visit.   Subjective Assessment -  04/08/21 0846     Subjective Pt. indicated no pain upon arrival today.  Pt. indicated some mild pain in garden this morning but feeling like he did better since last visit.    Pertinent History History of bilateral knee replacements, hyperlipidemia    Limitations Standing;Walking    How long can you sit comfortably? a few hours    How long can you stand comfortably? 10-15 mins    Patient Stated Goals Reduce pain to get back to normal activity during day.    Currently in Pain? No/denies    Pain Score 0-No pain    Pain Onset More than a month ago                               [facility] Adult PT Treatment/Exercise - 04/08/21 0001       Lumbar Exercises: Stretches   Lower Trunk Rotation 5 reps   15 sec x 5 bilateral   Other Lumbar Stretch Exercise lateral shift correction (wall at Lt) 5 sec hold x 10      Lumbar Exercises: Aerobic   Nustep Lvl 6 10 mins UE/LE      Lumbar Exercises: Supine   Bridge 10 reps;3 seconds  Lumbar Exercises: Prone   Other Prone Lumbar Exercises prone on elbows hold 1 min, press up 5 sec hold 2 x 10      Manual Therapy   Manual therapy comments cPA G3 L2-L5 15-20 sec bouts                      PT Short Term Goals - 04/08/21 0910       PT SHORT TERM GOAL #1   Title Patient will be independent with HEP     Time 3    Period Weeks    Status On-going    Target Date 04/18/21   corrected wrong date from evaluation              PT Long Term Goals - 03/30/21 1039       PT LONG TERM GOAL #1   Title independent with all HEP issued as of last visit      PT LONG TERM GOAL #2   Title Patient will demonstrate/report pain at worst less than or equal to 2/10 to facilitate minimal limitation in daily activity secondary to pain symptoms.    Status On-going      PT LONG TERM GOAL #3   Title Pt. will demonstrate FOTO outcome > or = 76 to indicated reduced disability due to condition.    Status New      PT LONG TERM GOAL  #4   Title Pt. will demonstrate lumbar ext > or = 50% WFL AROM to facilitate upright standing, walking at PLOF for house/yard activity.    Status On-going      PT LONG TERM GOAL #5   Title Pt. will demonstrate stnading posture s lateral shift for improved posture for standing/walking. 1      PT LONG TERM GOAL #6   Title Pt. will demonstrate supine to sit to supine transfers c log rolling for improved positioning for daily mobility.    Status On-going      PT LONG TERM GOAL #7   Title Pt. will demonstrate/indicate ability to perform house/yard work c mild difficulty or less (see FOTO assessment reporting, self reporting).    Status On-going                   Plan - 04/08/21 0904     Clinical Impression Statement Minimal complaints of pain noted with PAIVM reassessment today but restriciton in mobility still noted.  Reviewed and continued use of prone press up c offset hips to Lt to help reduced lateral shift and improve lumbar mobility and centralizations.    Personal Factors and Comorbidities Other;Time since onset of injury/illness/exacerbation    Examination-Activity Limitations Sleep;Squat;Bend;Stairs;Carry;Stand;Transfers;Lift;Locomotion Level;Bed Mobility    Examination-Participation Restrictions Community Activity;Yard Work;Cleaning;Meal Prep;Laundry    Stability/Clinical Decision Making Stable/Uncomplicated    Rehab Potential Good    PT Frequency 2x / week    PT Duration Other (comment)    PT Treatment/Interventions ADLs/Self Care Home Management;Cryotherapy;Electrical Stimulation;Iontophoresis 4mg /ml Dexamethasone;Moist Heat;Traction;Balance training;Therapeutic exercise;Therapeutic activities;Functional mobility training;Stair training;Gait training;Ultrasound;Neuromuscular re-education;DME Instruction;Patient/family education;Passive range of motion;Spinal Manipulations;Joint Manipulations;Dry needling;Taping;Manual techniques    PT Next Visit Plan Manual mobilizations  in prone for extension mobility coninued , progressive prone extension    PT Home Exercise Plan [id number]    Consulted and Agree with Plan of Care Patient             Patient will benefit from skilled therapeutic intervention in order to improve the following deficits and impairments:  Decreased endurance, Hypomobility, Pain, Postural dysfunction, Impaired perceived functional ability, Improper body mechanics, Impaired flexibility, Decreased range of motion, Difficulty walking, Decreased mobility, Decreased strength, Decreased activity tolerance  Visit Diagnosis: Chronic right-sided low back pain with right-sided sciatica  Muscle weakness (generalized)  Difficulty in walking, not elsewhere classified  Abnormal posture     Problem List Patient Active Problem List   Diagnosis Date Noted   HLD (hyperlipidemia) 07/23/2020   Chest pain 07/22/2020   Chest pain of uncertain etiology    Essential hypertension 01/22/2019   Persistent cough 01/22/2019   Right bundle branch block 11/21/2018   Left anterior fascicular block 11/21/2018   Former smoker 11/21/2018   Obesity (BMI 30-39.9) 11/21/2018   Need for vaccination 09/27/2018   Osteoarthritis of left knee 12/22/2014   Status post total left knee replacement 12/22/2014   Acute medial meniscus tear of left knee 02/24/2014    Scot Jun, PT, DPT, OCS, ATC 04/08/21  9:15 AM    [facility] [address] [place], [place], [address] Phone: [phone]   Fax:  [phone]  Name: Ryan Craig MRN: [id number] Date of Birth: 1946/02/16

## 2021-04-13 ENCOUNTER — Ambulatory Visit (INDEPENDENT_AMBULATORY_CARE_PROVIDER_SITE_OTHER): Payer: Medicare Other | Admitting: Rehabilitative and Restorative Service Providers"

## 2021-04-13 ENCOUNTER — Other Ambulatory Visit: Payer: Self-pay

## 2021-04-13 ENCOUNTER — Encounter: Payer: Self-pay | Admitting: Rehabilitative and Restorative Service Providers"

## 2021-04-13 DIAGNOSIS — R293 Abnormal posture: Secondary | ICD-10-CM | POA: Diagnosis not present

## 2021-04-13 DIAGNOSIS — R262 Difficulty in walking, not elsewhere classified: Secondary | ICD-10-CM

## 2021-04-13 DIAGNOSIS — M6281 Muscle weakness (generalized): Secondary | ICD-10-CM | POA: Diagnosis not present

## 2021-04-13 DIAGNOSIS — G8929 Other chronic pain: Secondary | ICD-10-CM | POA: Diagnosis not present

## 2021-04-13 DIAGNOSIS — M5441 Lumbago with sciatica, right side: Secondary | ICD-10-CM | POA: Diagnosis not present

## 2021-04-13 NOTE — Therapy (Signed)
[facility] [address] [place], [place], [address] Phone: [phone]   Fax:  [phone]  Physical Therapy Treatment  Patient Details  Name: Ryan Craig MRN: [id number] Date of Birth: [birth year] Referring Provider (PT): Dr. Jean Rosenthal   Encounter Date: 04/13/2021   PT End of Session - 04/13/21 0850     Visit Number 5    Number of Visits 20    Date for PT Re-Evaluation 06/06/21    Authorization Type Medicare, AETNA    Progress Note Due on Visit 10    PT Start Time 0844    PT Stop Time 0923    PT Time Calculation (min) 39 min    Activity Tolerance Patient tolerated treatment well    Behavior During Therapy [facility] for tasks assessed/performed             Past Medical History:  Diagnosis Date   Anemia    no on any meds   Arthritis    Chronic back pain    deteriorating   Hyperlipidemia    was on Lipitor but was taken off 3-45yrs ago by medical md bc running liver enzymes up   Hypertension    takes Amlodipine and Quinapril daily   Joint pain    Joint swelling     Past Surgical History:  Procedure Laterality Date   arthroscopic knee surgery Right    COLONOSCOPY     INGUINAL HERNIA REPAIR Bilateral    x 4   JOINT REPLACEMENT Right 6 yrs ago   knee   KNEE ARTHROSCOPY Left 02/24/2014   Procedure: LEFT KNEE ARTHROSCOPY WITH PARTIAL MEDIAL MENISCECTOMY;  Surgeon: Mcarthur Rossetti, MD;  Location: [place];  Service: Orthopedics;  Laterality: Left;   LEFT HEART CATH AND CORONARY ANGIOGRAPHY N/A 07/22/2020   Procedure: LEFT HEART CATH AND CORONARY ANGIOGRAPHY;  Surgeon: Burnell Blanks, MD;  Location: [place] [facility];  Service: Cardiovascular;  Laterality: N/A;   TOTAL KNEE ARTHROPLASTY Left 12/22/2014   Procedure: LEFT TOTAL KNEE ARTHROPLASTY;  Surgeon: Mcarthur Rossetti, MD;  Location: [place];  Service: Orthopedics;  Laterality: Left;    There were no vitals filed for this visit.   Subjective Assessment -  04/13/21 0850     Subjective Pt. stated good weekend and indicated no real pain today upon arrival.  Pt. stated doing pretty good.    Pertinent History History of bilateral knee replacements, hyperlipidemia    Limitations Standing;Walking    How long can you sit comfortably? a few hours    How long can you stand comfortably? 10-15 mins    Patient Stated Goals Reduce pain to get back to normal activity during day.    Currently in Pain? No/denies    Pain Score 0-No pain    Pain Onset More than a month ago                               [facility] Adult PT Treatment/Exercise - 04/13/21 0001       Lumbar Exercises: Stretches   Lower Trunk Rotation 5 reps   15 sec x 5 bilateral     Lumbar Exercises: Aerobic   Nustep Lvl 6 6 mins, lvl 5 6 mins UE/LE      Lumbar Exercises: Supine   Bridge 3 seconds;15 reps      Lumbar Exercises: Prone   Other Prone Lumbar Exercises prone on elbows hold 1 min, press up 5  sec hold 2 x 10      Manual Therapy   Manual therapy comments cPA G3 L2-L5 15-20 sec bouts                      PT Short Term Goals - 04/08/21 0910       PT SHORT TERM GOAL #1   Title Patient will be independent with HEP     Time 3    Period Weeks    Status On-going    Target Date 04/18/21   corrected wrong date from evaluation              PT Long Term Goals - 03/30/21 1039       PT LONG TERM GOAL #1   Title independent with all HEP issued as of last visit      PT LONG TERM GOAL #2   Title Patient will demonstrate/report pain at worst less than or equal to 2/10 to facilitate minimal limitation in daily activity secondary to pain symptoms.    Status On-going      PT LONG TERM GOAL #3   Title Pt. will demonstrate FOTO outcome > or = 76 to indicated reduced disability due to condition.    Status New      PT LONG TERM GOAL #4   Title Pt. will demonstrate lumbar ext > or = 50% WFL AROM to facilitate upright standing, walking at PLOF for  house/yard activity.    Status On-going      PT LONG TERM GOAL #5   Title Pt. will demonstrate stnading posture s lateral shift for improved posture for standing/walking. 1      PT LONG TERM GOAL #6   Title Pt. will demonstrate supine to sit to supine transfers c log rolling for improved positioning for daily mobility.    Status On-going      PT LONG TERM GOAL #7   Title Pt. will demonstrate/indicate ability to perform house/yard work c mild difficulty or less (see FOTO assessment reporting, self reporting).    Status On-going                   Plan - 04/13/21 0913     Clinical Impression Statement Pt. has continued to report improvement in at home symptoms and ability to move to this point.  Continued reduced restriction and pain in lumbar mobility assessments.  Lateral shift still prsent but reducing.    Personal Factors and Comorbidities Other;Time since onset of injury/illness/exacerbation    Examination-Activity Limitations Sleep;Squat;Bend;Stairs;Carry;Stand;Transfers;Lift;Locomotion Level;Bed Mobility    Examination-Participation Restrictions Community Activity;Yard Work;Cleaning;Meal Prep;Laundry    Stability/Clinical Decision Making Stable/Uncomplicated    Rehab Potential Good    PT Frequency 2x / week    PT Duration Other (comment)    PT Treatment/Interventions ADLs/Self Care Home Management;Cryotherapy;Electrical Stimulation;Iontophoresis 4mg /ml Dexamethasone;Moist Heat;Traction;Balance training;Therapeutic exercise;Therapeutic activities;Functional mobility training;Stair training;Gait training;Ultrasound;Neuromuscular re-education;DME Instruction;Patient/family education;Passive range of motion;Spinal Manipulations;Joint Manipulations;Dry needling;Taping;Manual techniques    PT Next Visit Plan Reassessment next visit c FOTO due to improvements. Continued manual intervention for mobility gains.    PT Home Exercise Plan [id number]    Consulted and Agree with Plan of  Care Patient             Patient will benefit from skilled therapeutic intervention in order to improve the following deficits and impairments:  Decreased endurance, Hypomobility, Pain, Postural dysfunction, Impaired perceived functional ability, Improper body mechanics, Impaired flexibility, Decreased range of motion, Difficulty walking, Decreased  mobility, Decreased strength, Decreased activity tolerance  Visit Diagnosis: Chronic right-sided low back pain with right-sided sciatica  Muscle weakness (generalized)  Difficulty in walking, not elsewhere classified  Abnormal posture     Problem List Patient Active Problem List   Diagnosis Date Noted   HLD (hyperlipidemia) 07/23/2020   Chest pain 07/22/2020   Chest pain of uncertain etiology    Essential hypertension 01/22/2019   Persistent cough 01/22/2019   Right bundle branch block 11/21/2018   Left anterior fascicular block 11/21/2018   Former smoker 11/21/2018   Obesity (BMI 30-39.9) 11/21/2018   Need for vaccination 09/27/2018   Osteoarthritis of left knee 12/22/2014   Status post total left knee replacement 12/22/2014   Acute medial meniscus tear of left knee 02/24/2014    Scot Jun, PT, DPT, OCS, ATC 04/13/21  9:27 AM   [facility] [address] [place], [place], [address] Phone: [phone]   Fax:  [phone]  Name: Duane Earnshaw MRN: [id number] Date of Birth: 1945-11-04

## 2021-04-15 ENCOUNTER — Encounter: Payer: Medicare Other | Admitting: Rehabilitative and Restorative Service Providers"

## 2021-04-20 ENCOUNTER — Ambulatory Visit (INDEPENDENT_AMBULATORY_CARE_PROVIDER_SITE_OTHER): Payer: Medicare Other | Admitting: Orthopaedic Surgery

## 2021-04-20 ENCOUNTER — Other Ambulatory Visit: Payer: Self-pay

## 2021-04-20 ENCOUNTER — Ambulatory Visit (INDEPENDENT_AMBULATORY_CARE_PROVIDER_SITE_OTHER): Payer: Medicare Other | Admitting: Rehabilitative and Restorative Service Providers"

## 2021-04-20 ENCOUNTER — Encounter: Payer: Self-pay | Admitting: Orthopaedic Surgery

## 2021-04-20 ENCOUNTER — Encounter: Payer: Self-pay | Admitting: Rehabilitative and Restorative Service Providers"

## 2021-04-20 DIAGNOSIS — M5442 Lumbago with sciatica, left side: Secondary | ICD-10-CM

## 2021-04-20 DIAGNOSIS — M6281 Muscle weakness (generalized): Secondary | ICD-10-CM

## 2021-04-20 DIAGNOSIS — G8929 Other chronic pain: Secondary | ICD-10-CM | POA: Diagnosis not present

## 2021-04-20 DIAGNOSIS — R293 Abnormal posture: Secondary | ICD-10-CM | POA: Diagnosis not present

## 2021-04-20 DIAGNOSIS — M5441 Lumbago with sciatica, right side: Secondary | ICD-10-CM | POA: Diagnosis not present

## 2021-04-20 DIAGNOSIS — R262 Difficulty in walking, not elsewhere classified: Secondary | ICD-10-CM

## 2021-04-20 NOTE — Therapy (Addendum)
[facility] [address] [place], [place], [address] Phone: [phone]   Fax:  [phone]  Physical Therapy Treatment/Progress Note/Discharge  Patient Details  Name: Ryan Craig MRN: [id number] Date of Birth: 03/13/1946 Referring Provider (PT): Dr. Jean Rosenthal   Encounter Date: 04/20/2021  Progress Note Reporting Period 03/28/2021 to 04/20/2021  See note below for Objective Data and Assessment of Progress/Goals.       PT End of Session - 04/20/21 0756     Visit Number 6    Number of Visits 20    Date for PT Re-Evaluation 06/06/21    Authorization Type Medicare, [employer]ET[employer]    Progress Note Due on Visit 10    PT Start Time 0758    PT Stop Time 0828    PT Time Calculation (min) 30 min    Activity Tolerance Patient tolerated treatment well    Behavior During Therapy WFL for tasks assessed/performed             Past Medical History:  Diagnosis Date   Anemia    no on any meds   Arthritis    Chronic back pain    deteriorating   Hyperlipidemia    was on Lipitor but was taken off 3-37yr ago by medical md bc running liver enzymes up   Hypertension    takes Amlodipine and Quinapril daily   Joint pain    Joint swelling     Past Surgical History:  Procedure Laterality Date   arthroscopic knee surgery Right    COLONOSCOPY     INGUINAL HERNIA REPAIR Bilateral    x 4   JOINT REPLACEMENT Right 6 yrs ago   knee   KNEE ARTHROSCOPY Left 02/24/2014   Procedure: LEFT KNEE ARTHROSCOPY WITH PARTIAL MEDIAL MENISCECTOMY;  Surgeon: CMcarthur Rossetti MD;  [facility]  Service: Orthopedics;  Laterality: Left;   LEFT HEART CATH AND CORONARY ANGIOGRAPHY N/A 07/22/2020   Procedure: LEFT HEART CATH AND CORONARY ANGIOGRAPHY;  Surgeon: MBurnell Blanks MD;  [facility];  Service: Cardiovascular;  Laterality: N/A;   TOTAL KNEE ARTHROPLASTY Left 12/22/2014   Procedure: LEFT TOTAL KNEE ARTHROPLASTY;  Surgeon:  CMcarthur Rossetti MD;  [facility]  Service: Orthopedics;  Laterality: Left;    There were no vitals filed for this visit.   Subjective Assessment - 04/20/21 0809     Subjective Pt. indicated feeling like he was doing better.  Reported bending over in garden gave pain up to 4/10 or so but improved c break from activity.  Pt. indicated he feels better with use of HEP and felt ready for discharge.    Pertinent History History of bilateral knee replacements, hyperlipidemia    Limitations Standing;Walking    How long can you sit comfortably? a few hours    How long can you stand comfortably? 10-15 mins    Patient Stated Goals Reduce pain to get back to normal activity during day.    Currently in Pain? No/denies    Pain Score 0-No pain    Pain Onset More than a month ago    Aggravating Factors  bending at times                [facility]       Assessment   Medical Diagnosis Chronic Low back pain    Referring Provider (PT) Dr. CJean Rosenthal   Onset Date/Surgical Date 01/21/21    Hand Dominance Right  Observation/Other Assessments   Focus on Therapeutic Outcomes (FOTO)  update 75%      AROM   Lumbar Extension 75%    Lumbar - Right Side Bend movement to lateral femoral epicondyle    Lumbar - Left Side Bend movement to lateral femoral epicondyle                           [facility] Adult PT Treatment/Exercise - 04/20/21 0001       Exercises   Other Exercises  HEP review and dicussion about routine use and continued use for symptoms management and continued improvment.      Lumbar Exercises: Aerobic   Nustep Lvl 6 12 mins UE/LE                    PT Education - 04/20/21 0828     Education Details HEP review for trial d/c.    Person(s) Educated Patient;Spouse    Methods Explanation    Comprehension Verbalized understanding              PT Short Term Goals - 04/20/21 0810       PT SHORT TERM  GOAL #1   Title Patient will be independent with HEP     Time 3    Period Weeks    Status Achieved    Target Date 04/18/21   corrected wrong date from evaluation              PT Long Term Goals - 04/20/21 0814       PT LONG TERM GOAL #1   Title independent with all HEP issued as of last visit    Status Achieved      PT LONG TERM GOAL #2   Title Patient will demonstrate/report pain at worst less than or equal to 2/10 to facilitate minimal limitation in daily activity secondary to pain symptoms.    Status Partially Met      PT LONG TERM GOAL #3   Title Pt. will demonstrate FOTO outcome > or = 76 to indicated reduced disability due to condition.    Status Partially Met      PT LONG TERM GOAL #4   Title Pt. will demonstrate lumbar ext > or = 50% WFL AROM to facilitate upright standing, walking at PLOF for house/yard activity.    Status Achieved      PT LONG TERM GOAL #5   Title Pt. will demonstrate stnading posture s lateral shift for improved posture for standing/walking. 1    Status Partially Met      PT LONG TERM GOAL #6   Title Pt. will demonstrate supine to sit to supine transfers c log rolling for improved positioning for daily mobility.    Status Achieved      PT LONG TERM GOAL #7   Title Pt. will demonstrate/indicate ability to perform house/yard work c mild difficulty or less (see FOTO assessment reporting, self reporting).    Status Achieved                   Plan - 04/20/21 4818     Clinical Impression Statement Pt. has attended 6 visits overall during course of treatment, reporting marked reduction in symptoms and improved functional outcome.  See objective data for updated information showing progress towards established goals.  Pt. does continue to present c lateral shift deviation and lumbar mobility deficits that have improved but still are present.  Pt. indicated he felt like he would be able to continue progression c use of HEP.  Trial d/c to  HEP at this time (will close after 30 days inactivity).    Personal Factors and Comorbidities Other;Time since onset of injury/illness/exacerbation    Examination-Activity Limitations Sleep;Squat;Bend;Stairs;Carry;Stand;Transfers;Lift;Locomotion Level;Bed Mobility    Examination-Participation Restrictions Community Activity;Yard Work;Cleaning;Meal Prep;Laundry    Stability/Clinical Decision Making Stable/Uncomplicated    Rehab Potential Good    PT Frequency 2x / week    PT Duration Other (comment)    PT Treatment/Interventions ADLs/Self Care Home Management;Cryotherapy;Electrical Stimulation;Iontophoresis 5m/ml Dexamethasone;Moist Heat;Traction;Balance training;Therapeutic exercise;Therapeutic activities;Functional mobility training;Stair training;Gait training;Ultrasound;Neuromuscular re-education;DME Instruction;Patient/family education;Passive range of motion;Spinal Manipulations;Joint Manipulations;Dry needling;Taping;Manual techniques    PT Next Visit Plan Trial HEP time.    PT Home Exercise Plan [id number]    Consulted and Agree with Plan of Care Patient             Patient will benefit from skilled therapeutic intervention in order to improve the following deficits and impairments:  Decreased endurance, Hypomobility, Pain, Postural dysfunction, Impaired perceived functional ability, Improper body mechanics, Impaired flexibility, Decreased range of motion, Difficulty walking, Decreased mobility, Decreased strength, Decreased activity tolerance  Visit Diagnosis: Chronic right-sided low back pain with right-sided sciatica  Muscle weakness (generalized)  Difficulty in walking, not elsewhere classified  Abnormal posture     Problem List Patient Active Problem List   Diagnosis Date Noted   HLD (hyperlipidemia) 07/23/2020   Chest pain 07/22/2020   Chest pain of uncertain etiology    Essential hypertension 01/22/2019   Persistent cough 01/22/2019   Right bundle branch block  11/21/2018   Left anterior fascicular block 11/21/2018   Former smoker 11/21/2018   Obesity (BMI 30-39.9) 11/21/2018   Need for vaccination 09/27/2018   Osteoarthritis of left knee 12/22/2014   Status post total left knee replacement 12/22/2014   Acute medial meniscus tear of left knee 02/24/2014   MScot Jun PT, DPT, OCS, ATC 04/20/21  8:30 AM  PHYSICAL THERAPY DISCHARGE SUMMARY  Visits from Start of Care: 6  Current functional level related to goals / functional outcomes: See note   Remaining deficits: See note   Education / Equipment: HEP   Patient agrees to discharge. Patient goals were partially met. Patient is being discharged due to being pleased with the current functional level. MScot Jun PT, DPT, OCS, ATC 05/23/21  2:48 PM       [facility] [address] [address]Phone: [phone]  Fax:  [phone] Name: Ryan MccleanMRN: [id number] of Birth: [birth year]

## 2021-04-20 NOTE — Progress Notes (Signed)
[name] is referred for follow-up after having multiple visits to physical therapy for his lumbar spine.  He says he is about 75% better and therapy is helped quite a bit.  He actually graduated from physical therapy today.  He is denying any radicular symptoms.  He was having some right-sided sciatica.  He still has some right-sided low back pain but it is minimal.  On exam he is mobilizing well.  He has a negative straight leg raise bilaterally.  He has good strength in his lower extremities as well.  He gets out of a chair easier.  He does have some mild right-sided low back pain.  At this point he will transition to home exercise program.  All questions and concerns were answered and addressed.  Follow-up can be as needed.

## 2021-05-04 ENCOUNTER — Encounter: Payer: Medicare Other | Admitting: Rehabilitative and Restorative Service Providers"

## 2021-05-06 ENCOUNTER — Encounter: Payer: Medicare Other | Admitting: Rehabilitative and Restorative Service Providers"

## 2021-05-11 ENCOUNTER — Encounter: Payer: Medicare Other | Admitting: Rehabilitative and Restorative Service Providers"

## 2021-05-13 ENCOUNTER — Encounter: Payer: Medicare Other | Admitting: Rehabilitative and Restorative Service Providers"

## 2021-05-16 ENCOUNTER — Other Ambulatory Visit: Payer: Self-pay | Admitting: Interventional Cardiology

## 2021-05-18 ENCOUNTER — Encounter: Payer: Medicare Other | Admitting: Rehabilitative and Restorative Service Providers"

## 2021-05-20 ENCOUNTER — Encounter: Payer: Medicare Other | Admitting: Rehabilitative and Restorative Service Providers"

## 2021-05-25 ENCOUNTER — Encounter: Payer: Medicare Other | Admitting: Rehabilitative and Restorative Service Providers"

## 2021-05-27 ENCOUNTER — Encounter: Payer: Medicare Other | Admitting: Rehabilitative and Restorative Service Providers"

## 2021-06-28 ENCOUNTER — Other Ambulatory Visit: Payer: Self-pay

## 2021-06-28 ENCOUNTER — Ambulatory Visit (INDEPENDENT_AMBULATORY_CARE_PROVIDER_SITE_OTHER): Payer: Medicare Other | Admitting: Nurse Practitioner

## 2021-06-28 ENCOUNTER — Encounter: Payer: Self-pay | Admitting: Nurse Practitioner

## 2021-06-28 VITALS — BP 126/84 | HR 64 | Temp 97.5°F | Ht 67.0 in | Wt 216.8 lb

## 2021-06-28 DIAGNOSIS — E7800 Pure hypercholesterolemia, unspecified: Secondary | ICD-10-CM

## 2021-06-28 DIAGNOSIS — Z23 Encounter for immunization: Secondary | ICD-10-CM | POA: Diagnosis not present

## 2021-06-28 DIAGNOSIS — Z2821 Immunization not carried out because of patient refusal: Secondary | ICD-10-CM

## 2021-06-28 DIAGNOSIS — W19XXXA Unspecified fall, initial encounter: Secondary | ICD-10-CM | POA: Diagnosis not present

## 2021-06-28 DIAGNOSIS — I10 Essential (primary) hypertension: Secondary | ICD-10-CM | POA: Diagnosis not present

## 2021-06-28 LAB — CMP14+EGFR
ALT: 13 IU/L (ref 0–44)
AST: 17 IU/L (ref 0–40)
Albumin/Globulin Ratio: 2.1 (ref 1.2–2.2)
Albumin: 4.4 g/dL (ref 3.7–4.7)
Alkaline Phosphatase: 64 IU/L (ref 44–121)
BUN/Creatinine Ratio: 17 (ref 10–24)
BUN: 15 mg/dL (ref 8–27)
Bilirubin Total: 0.6 mg/dL (ref 0.0–1.2)
CO2: 21 mmol/L (ref 20–29)
Calcium: 9.8 mg/dL (ref 8.6–10.2)
Chloride: 104 mmol/L (ref 96–106)
Creatinine, Ser: 0.9 mg/dL (ref 0.76–1.27)
Globulin, Total: 2.1 g/dL (ref 1.5–4.5)
Glucose: 107 mg/dL — ABNORMAL HIGH (ref 65–99)
Potassium: 4.5 mmol/L (ref 3.5–5.2)
Sodium: 139 mmol/L (ref 134–144)
Total Protein: 6.5 g/dL (ref 6.0–8.5)
eGFR: 88 mL/min/{1.73_m2} (ref >59)

## 2021-06-28 NOTE — Progress Notes (Signed)
I,Yamilka Roman [employer] as a Education administrator for [employer], FNP.,have documented all relevant documentation on the behalf of [name], FNP,as directed by  [name], FNP while in the presence of [name], [place].   This visit occurred during the SARS-CoV-2 public health emergency.  Safety protocols were in place, including screening questions prior to the visit, additional usage of staff PPE, and extensive cleaning of exam room while observing appropriate contact time as indicated for disinfecting solutions.  Subjective:     Patient ID: Ryan Craig , male    DOB: 1945/12/07 , 75 y.o.   MRN: [id number]   Chief Complaint  Patient presents with   Hypertension    HPI  Patient is here for htn follow up. He reports compliance with medications. He cuts his lawn and gardening for his physical activity. He reports he weighs about 205 lbs when he is at home without clothes.   He had a fall a few weeks ago but did not injure himself he thinks he missed a step.   Hypertension This is a chronic problem. The current episode started more than 1 year ago. The problem is unchanged. The problem is controlled. Pertinent negatives include no chest pain, headaches, palpitations or shortness of breath. There are no associated agents to hypertension. Risk factors for coronary artery disease include dyslipidemia, obesity and male gender. Past treatments include ACE inhibitors and calcium channel blockers. The current treatment provides significant improvement. There are no compliance problems.  There is no history of angina. There is no history of chronic renal disease.    Past Medical History:  Diagnosis Date   Anemia    no on any meds   Arthritis    Chronic back pain    deteriorating   Hyperlipidemia    was on Lipitor but was taken off 3-6yr ago by medical md bc running liver enzymes up   Hypertension    takes Amlodipine and Quinapril daily   Joint pain    Joint swelling      Family  History  Problem Relation Age of Onset   Heart attack Mother    Cancer Maternal Aunt    Cancer Paternal Aunt    Cancer Cousin      Current Outpatient Medications:    acetaminophen (TYLENOL) 500 MG tablet, Take 500 mg by mouth every 6 (six) hours as needed., Disp: , Rfl:    amLODipine (NORVASC) 10 MG tablet, TAKE 1 TABLET BY MOUTH  DAILY, Disp: 90 tablet, Rfl: 3   aspirin EC 81 MG tablet, Take 81 mg by mouth daily., Disp: , Rfl:    BLACK CURRANT SEED OIL PO, Take by mouth., Disp: , Rfl:    Cholecalciferol (VITAMIN D-3 PO), Take 2 tablets by mouth daily. , Disp: , Rfl:    diclofenac Sodium (VOLTAREN) 1 % GEL, Apply 2 g topically 4 (four) times daily., Disp: 100 g, Rfl: 2   metoprolol succinate (TOPROL-XL) 25 MG 24 hr tablet, Take 1 tablet (25 mg total) by mouth daily., Disp: 90 tablet, Rfl: 3   quinapril (ACCUPRIL) 40 MG tablet, TAKE 1 TABLET BY MOUTH  DAILY, Disp: 90 tablet, Rfl: 3   rosuvastatin (CRESTOR) 40 MG tablet, TAKE 1 TABLET BY MOUTH EVERY DAY, Disp: 180 tablet, Rfl: 1   triamcinolone (KENALOG) 0.1 %, APPLY TOPICALLY A THIN LAYER TO THE AFFECTED AREA(S) TWICE A DAY, Disp: 454 g, Rfl: 0   Allergies  Allergen Reactions   Contrast Media [Iodinated Diagnostic Agents] Anaphylaxis   Latex  Rash     Review of Systems  Constitutional: Negative.   Eyes: Negative.   Respiratory:  Negative for shortness of breath.   Cardiovascular:  Negative for chest pain, palpitations and leg swelling.  Skin: Negative.   Neurological:  Negative for dizziness and headaches.  Hematological: Negative.   Psychiatric/Behavioral: Negative.      Today's Vitals   06/28/21 0833  BP: 126/84  Pulse: 64  Temp: (!) 97.5 F (36.4 C)  TempSrc: Oral  Weight: 216 lb 12.8 oz (98.3 kg)  Height: 5' 7"  (1.702 m)   Body mass index is 33.96 kg/m.  Wt Readings from Last 3 Encounters:  06/28/21 216 lb 12.8 oz (98.3 kg)  02/22/21 217 lb 6.4 oz (98.6 kg)  11/25/20 215 lb 2.7 oz (97.6 kg)    BP Readings from  Last 3 Encounters:  06/28/21 126/84  02/22/21 (!) 142/76  11/25/20 130/68    Objective:  Physical Exam Vitals reviewed.  Constitutional:      General: He is not in acute distress.    Appearance: Normal appearance. He is obese.  Cardiovascular:     Rate and Rhythm: Normal rate and regular rhythm.     Pulses: Normal pulses.     Heart sounds: Normal heart sounds. No murmur heard. Pulmonary:     Effort: Pulmonary effort is normal. No respiratory distress.     Breath sounds: Normal breath sounds. No wheezing.  Abdominal:     Tenderness: There is no right CVA tenderness or left CVA tenderness.  Skin:    Capillary Refill: Capillary refill takes less than 2 seconds.  Neurological:     General: No focal deficit present.     Mental Status: He is alert and oriented to person, place, and time.     Cranial Nerves: No cranial nerve deficit.  Psychiatric:        Mood and Affect: Mood normal.        Behavior: Behavior normal.        Thought Content: Thought content normal.        Judgment: Judgment normal.        Assessment And Plan:     1. Essential hypertension Comments: Blood pressure is well controlled Continue current medications Will check kidney functions with GFR - CMP14+EGFR  2. Pure hypercholesterolemia Comments: Well controlled, will check at next visit - CMP14+EGFR  3. Need for influenza vaccination Influenza vaccine administered Encouraged to take Tylenol as needed for fever or muscle aches. - Flu Vaccine QUAD High Dose(Fluad)  4. Immunization declined Comments: Declines having a shingrix  5. Fall, initial encounter Discussed importance of having throw rugs and ensuring he is lifting his feet when walking. He did not have an injury with this fall   Patient was given opportunity to ask questions. Patient verbalized understanding of the plan and was able to repeat key elements of the plan. All questions were answered to their satisfaction.  [name], FNP    I, [name], FNP, have reviewed all documentation for this visit. The documentation on 06/28/21 for the exam, diagnosis, procedures, and orders are all accurate and complete.   IF YOU HAVE BEEN REFERRED TO A SPECIALIST, IT MAY TAKE 1-2 WEEKS TO SCHEDULE/PROCESS THE REFERRAL. IF YOU HAVE NOT HEARD FROM US/SPECIALIST IN TWO WEEKS, PLEASE GIVE [place] A CALL AT [phone] X [phone].   THE PATIENT IS ENCOURAGED TO PRACTICE SOCIAL DISTANCING DUE TO THE COVID-19 PANDEMIC.

## 2021-06-28 NOTE — Patient Instructions (Signed)

## 2021-08-01 ENCOUNTER — Other Ambulatory Visit: Payer: Self-pay | Admitting: Orthopaedic Surgery

## 2021-08-01 ENCOUNTER — Telehealth: Payer: Self-pay | Admitting: Physician Assistant

## 2021-08-01 DIAGNOSIS — G8929 Other chronic pain: Secondary | ICD-10-CM

## 2021-08-01 DIAGNOSIS — M5442 Lumbago with sciatica, left side: Secondary | ICD-10-CM

## 2021-08-01 MED ORDER — ACETAMINOPHEN-CODEINE #3 300-30 MG PO TABS: 1.0000 | ORAL_TABLET | Freq: Three times a day (TID) | ORAL | 0 refills | Status: DC | PRN

## 2021-08-01 MED ORDER — METHYLPREDNISOLONE 4 MG PO TABS: ORAL_TABLET | ORAL | 0 refills | Status: DC

## 2021-08-01 NOTE — Telephone Encounter (Signed)
Pt called requesting a call back as soon as possible. Pt states he is having back and leg pains and can barely walk and is asking to be worked in today or sometime tomorrow. Please call pt at [phone].

## 2021-08-01 NOTE — Telephone Encounter (Signed)
Please advise 

## 2021-08-01 NOTE — Telephone Encounter (Signed)
Called pt. Dr. Ninfa Linden already sent in steroid and pain meds. Pt wants to proceed with MRI. This was ordered.

## 2021-08-08 ENCOUNTER — Ambulatory Visit (INDEPENDENT_AMBULATORY_CARE_PROVIDER_SITE_OTHER): Payer: Medicare Other | Admitting: Orthopaedic Surgery

## 2021-08-08 ENCOUNTER — Other Ambulatory Visit: Payer: Self-pay

## 2021-08-08 ENCOUNTER — Encounter: Payer: Self-pay | Admitting: Orthopaedic Surgery

## 2021-08-08 ENCOUNTER — Telehealth: Payer: Self-pay

## 2021-08-08 DIAGNOSIS — M5442 Lumbago with sciatica, left side: Secondary | ICD-10-CM

## 2021-08-08 MED ORDER — BACLOFEN 10 MG PO TABS
10.0000 mg | ORAL_TABLET | Freq: Three times a day (TID) | ORAL | 0 refills | Status: DC | PRN
Start: 2021-08-08 — End: 2022-08-28

## 2021-08-08 NOTE — Telephone Encounter (Signed)
LMOM for patient of the below message  

## 2021-08-08 NOTE — Progress Notes (Signed)
The patient comes in today with continued significant left-sided sciatica with radicular symptoms going all the way down to his foot on the left side.  He is 75 years old.  I seen him for this already over the last 6 weeks.  He has tried conservative treatment including activity modification, anti-inflammatories, back extension exercises and time.  We will send him to therapy as well.  He is continue to have significant radicular symptoms going down his left leg.  He has upon straight leg raise on the left side.  He is subjective numbness over the dorsal aspect of his left foot and not on the right side.  He has weakness with dorsiflexion of the foot.  He is ambulating with a cane.  Previous plain films lumbar spine show degenerative changes at multiple levels.  Given the failure observed treatment for over 6 weeks and given the worsening of his radicular symptoms involving his left lower extremity, a MRI is warranted at this point and lumbar spine to rule out nerve compression.  I will try some baclofen as a muscle relaxant.  He has already been on a steroid taper as well.  We will go over the MRI results and come up with a treatment plan once we have that MRI of the lumbar spine.

## 2021-08-08 NOTE — Telephone Encounter (Signed)
Can you tell me the status on his MRI?

## 2021-08-16 ENCOUNTER — Other Ambulatory Visit: Payer: Self-pay | Admitting: Orthopaedic Surgery

## 2021-08-16 ENCOUNTER — Telehealth: Payer: Self-pay | Admitting: Orthopaedic Surgery

## 2021-08-16 MED ORDER — HYDROCODONE-ACETAMINOPHEN 5-325 MG PO TABS: 1.0000 | ORAL_TABLET | Freq: Four times a day (QID) | ORAL | 0 refills | Status: DC | PRN

## 2021-08-16 NOTE — Telephone Encounter (Signed)
Pt called requesting a stronger medication. Pt states what is is taking is not touching his pain. Please send to pharmacy on file. Pt phone number is [phone].

## 2021-08-16 NOTE — Telephone Encounter (Signed)
Please advise 

## 2021-08-17 ENCOUNTER — Other Ambulatory Visit: Payer: Self-pay | Admitting: Nurse Practitioner

## 2021-08-17 MED ORDER — QUINAPRIL HCL 40 MG PO TABS: 40.0000 mg | ORAL_TABLET | Freq: Every day | ORAL | 3 refills | Status: DC

## 2021-08-20 ENCOUNTER — Ambulatory Visit
Admission: RE | Admit: 2021-08-20 | Discharge: 2021-08-20 | Disposition: A | Discharge status: Home / Self Care | Payer: Medicare Other | Source: Ambulatory Visit | Attending: Orthopaedic Surgery | Admitting: Orthopaedic Surgery

## 2021-08-20 ENCOUNTER — Other Ambulatory Visit: Payer: Self-pay

## 2021-08-20 DIAGNOSIS — M48061 Spinal stenosis, lumbar region without neurogenic claudication: Secondary | ICD-10-CM | POA: Diagnosis not present

## 2021-08-20 DIAGNOSIS — G8929 Other chronic pain: Secondary | ICD-10-CM

## 2021-08-20 DIAGNOSIS — M545 Low back pain, unspecified: Secondary | ICD-10-CM | POA: Diagnosis not present

## 2021-08-20 DIAGNOSIS — M5442 Lumbago with sciatica, left side: Secondary | ICD-10-CM

## 2021-08-20 IMAGING — MR MR LUMBAR SPINE W/O CM
4 of 5 series · 26 of 48 positions shown · non-contrast
Comparison: Lumbar spine MRI [DATE]

CLINICAL DATA: Low back and left leg pain.

EXAM:
MRI LUMBAR SPINE WITHOUT CONTRAST
TECHNIQUE: Multiplanar, multisequence MR imaging of the lumbar spine was
performed. No intravenous contrast was administered.

[Series 2: T2 · sagittal · 4.0mm · 1.09mm/px · 5 of 15 slices shown (1 of 2)]
[im 1/15]
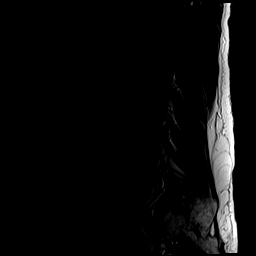
[im 4/15]
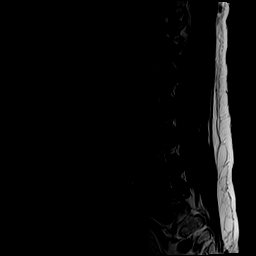
[im 8/15]
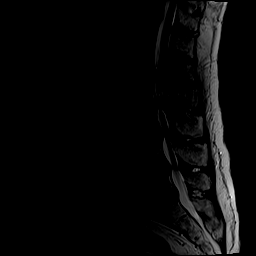
[im 11/15]
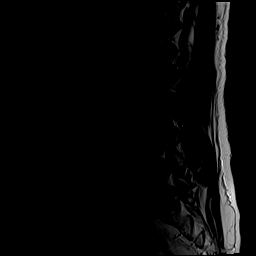
[im 15/15]
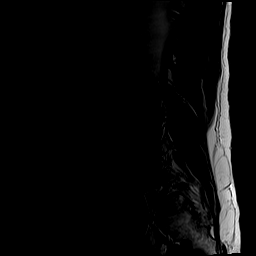

[Series 4: T1 · sagittal · 4.0mm · 1.09mm/px · 6 of 15 slices shown (1 of 2)]
[im 1/15]
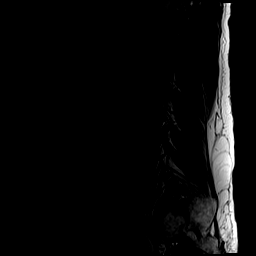
[im 3/15]
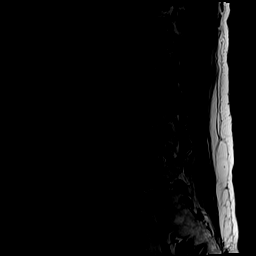
[im 6/15]
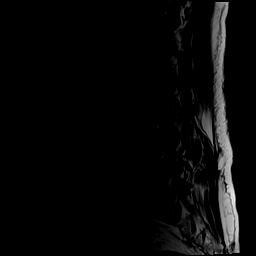
[im 9/15]
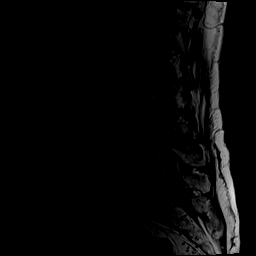
[im 12/15]
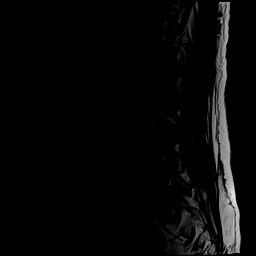
[im 15/15]
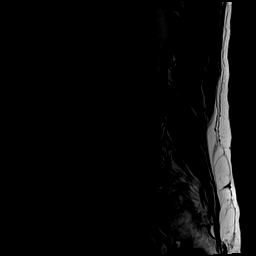

[Series 5: T2 · axial · 4.0mm · 0.39mm/px · z∈[-88,+121]mm · 10 of 42 slices shown (2 of 2)]
[im 3/42]
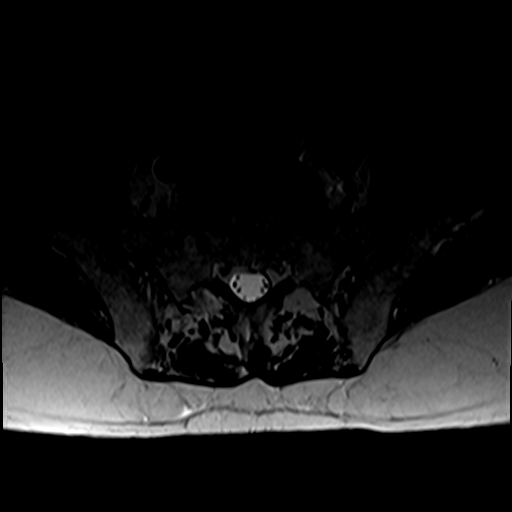
[im 6/42]
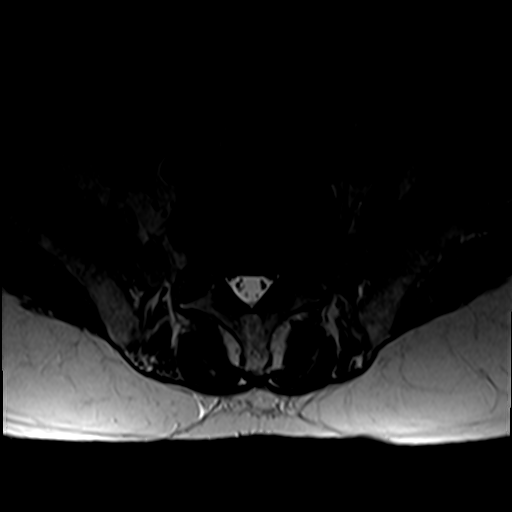
[im 9/42]
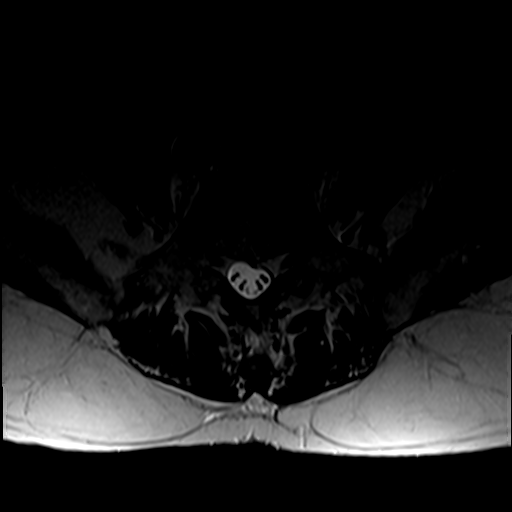
[im 14/42]
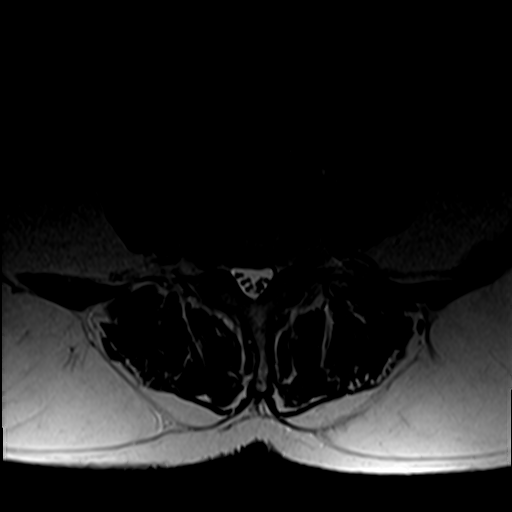
[im 20/42]
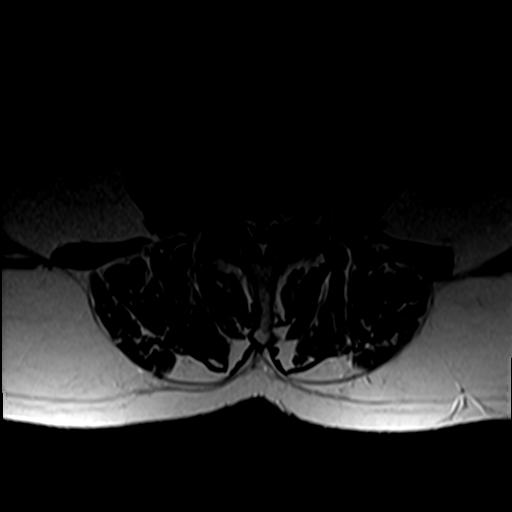
[im 22/42]
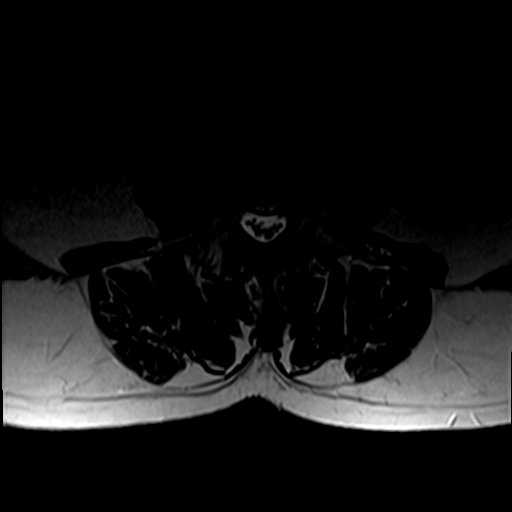
[im 25/42]
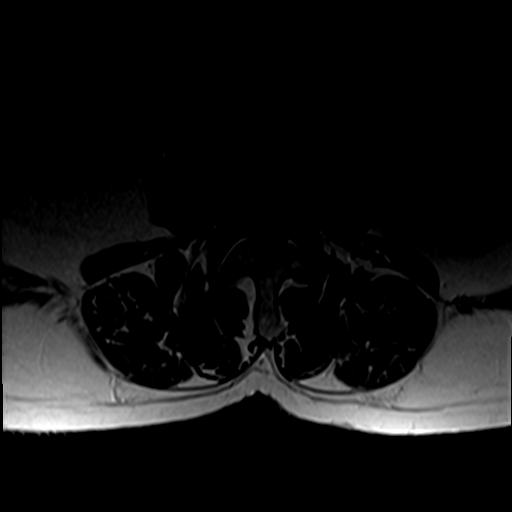
[im 31/42]
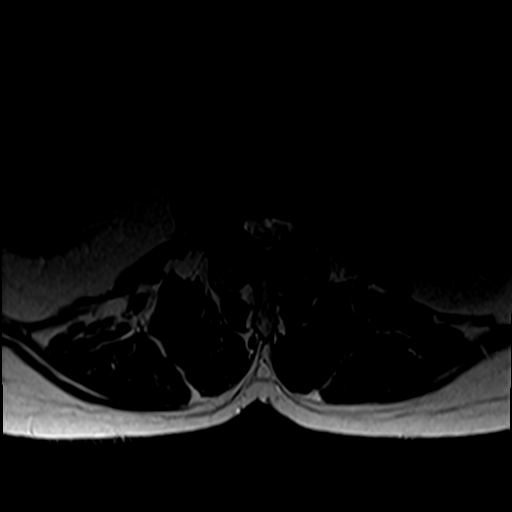
[im 36/42]
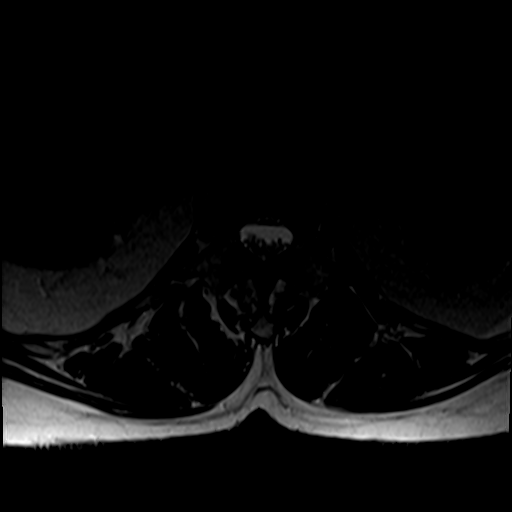
[im 42/42]
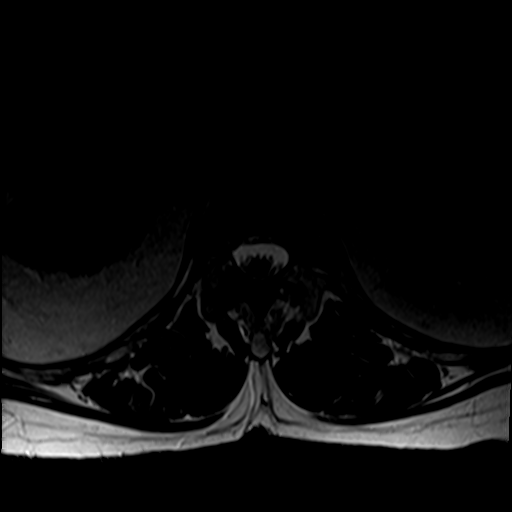

[Series 6: T1 · axial · 4.0mm · 0.39mm/px · z∈[-88,+92]mm · 5 of 42 slices shown (2 of 2)]
[im 3/42]
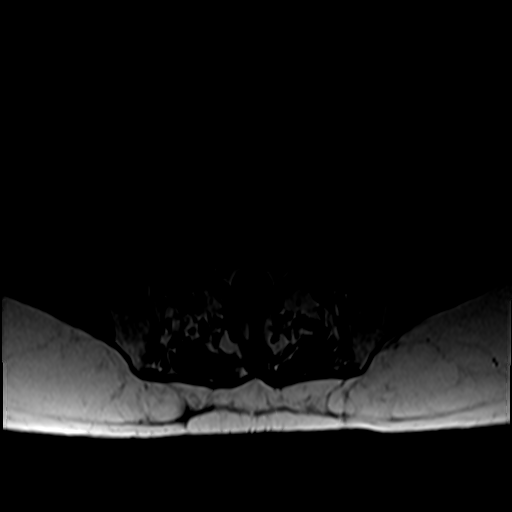
[im 6/42]
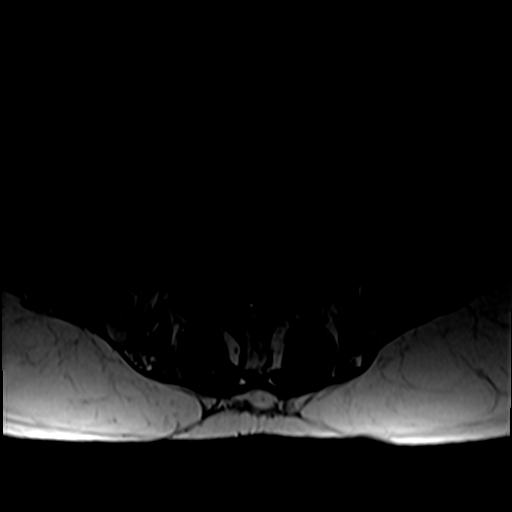
[im 9/42]
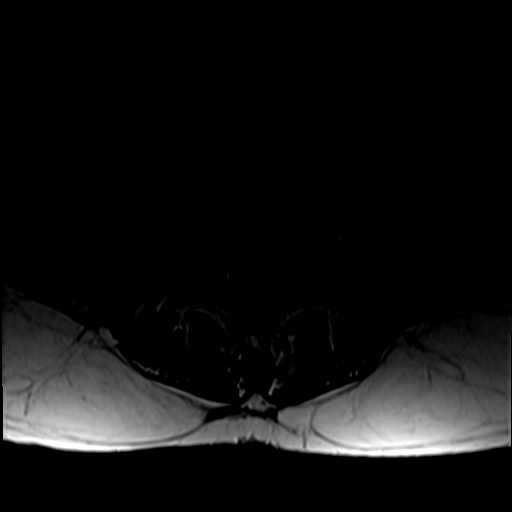
[im 22/42]
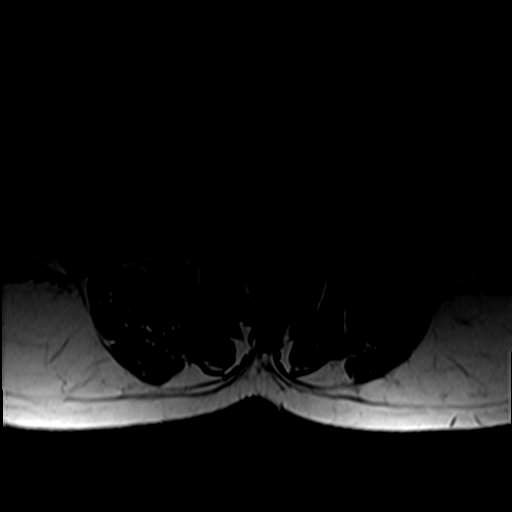
[im 36/42]
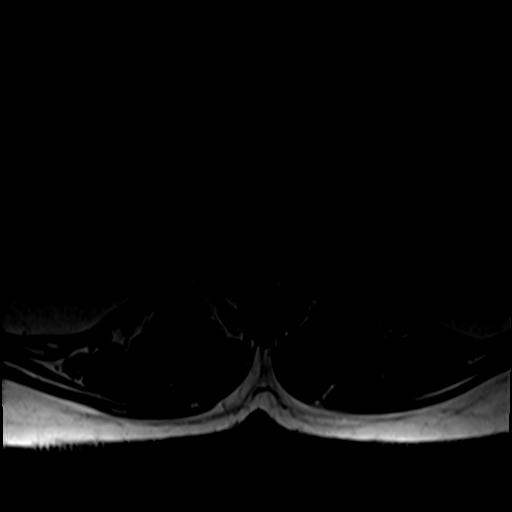

[26 of 48 positions shown; findings below may reference images not displayed]

FINDINGS: Segmentation:  Standard.

Alignment:  Trace retrolisthesis of L5 on S1.

Vertebrae: No fracture or suspicious marrow lesion. Mild multilevel
degenerative endplate changes.

Conus medullaris and cauda equina: Conus extends to the L1 level.
Conus and cauda equina appear normal.

Paraspinal and other soft tissues: Unremarkable.

Disc levels:

Disc desiccation and disc space narrowing throughout the lumbar
spine including moderate narrowing from L3-4 to L5-S1. Diffuse
congenital narrowing of the lumbar spinal canal due to short
pedicles.

L1-2: Disc bulging, a new right subarticular disc protrusion, and
mild facet and ligamentum flavum hypertrophy result in new mild
spinal stenosis, mild right lateral recess stenosis, and mild right
neural foraminal stenosis.

L2-3: Disc bulging and moderate facet and ligamentum flavum
hypertrophy result in severe spinal stenosis and mild right greater
than left neural foraminal stenosis, progressed from prior.

L3-4: Disc bulging, a central disc protrusion, and mild facet and
ligamentum flavum hypertrophy result in severe spinal stenosis and
moderate bilateral neural foraminal stenosis, progressed from prior.

L4-5: Disc bulging, a moderate-sized left paracentral disc
extrusion, and mild facet and ligamentum flavum hypertrophy result
in mild spinal stenosis, mild-to-moderate right and severe left
lateral recess stenosis, and mild left neural foraminal stenosis.
The disc extrusion is smaller than on the prior study, however there
is persistent left L5 nerve root impingement in the left lateral
recess.

L5-S1: Disc bulging, a small chronic left paracentral to
subarticular disc extrusion with slight caudal migration, and mild
facet hypertrophy result in mild-to-moderate bilateral lateral
recess stenosis and mild bilateral neural foraminal stenosis, mildly
progressed. No spinal stenosis.
IMPRESSION: 1. Progressive diffuse lumbar disc and facet degeneration with
severe spinal stenosis at L2-3 and L3-4.
2. Decreased size of L4-5 disc extrusion but with persistent severe
left lateral recess stenosis.

## 2021-08-22 ENCOUNTER — Telehealth: Payer: Self-pay | Admitting: Orthopaedic Surgery

## 2021-08-22 NOTE — Telephone Encounter (Signed)
Patient called. He says [employer] needs Dr. Ninfa Linden to call and authorize the pain medication. [phone]

## 2021-08-23 NOTE — Telephone Encounter (Signed)
Awaiting PA form

## 2021-08-24 NOTE — Telephone Encounter (Signed)
Approved, pharmacy and patient aware.

## 2021-08-25 ENCOUNTER — Encounter: Payer: Self-pay | Admitting: Physician Assistant

## 2021-08-25 ENCOUNTER — Ambulatory Visit (INDEPENDENT_AMBULATORY_CARE_PROVIDER_SITE_OTHER): Payer: Medicare Other | Admitting: Physician Assistant

## 2021-08-25 DIAGNOSIS — M48062 Spinal stenosis, lumbar region with neurogenic claudication: Secondary | ICD-10-CM

## 2021-08-25 MED ORDER — DIAZEPAM 5 MG PO TABS: ORAL_TABLET | ORAL | 0 refills | Status: DC

## 2021-08-25 NOTE — Progress Notes (Signed)
Office Visit Note   Patient: Ryan Craig           Date of Birth: Jan 26, 1946           MRN: [id number] Visit Date: 08/25/2021              Requested by: [name], [place],  [place] [address] PCP: [name], FNP   Assessment & Plan: Visit Diagnoses:  1. Spinal stenosis of lumbar region with neurogenic claudication     Plan:  We will refer him to Dr. Ernestina Patches for lumbar epidural steroid injection.  Having follow-up with [place] approximately 2 weeks after the injection to see what type of response he had.  Questions were encouraged and answered at length.  Follow-Up Instructions: Return in about 2 weeks (around 09/08/2021).   Orders:  No orders of the defined types were placed in this encounter.  Meds ordered this encounter  Medications   diazepam (VALIUM) 5 MG tablet    Sig: TAKE ONE TAB ONE HOUR PRIOR TO INJECTION REPEAT AS NEEDED #2 . ZERO REFILLS    Dispense:  2 tablet    Refill:  0      Procedures: No procedures performed   Clinical Data: No additional findings.   Subjective: Chief Complaint  Patient presents with   Lower Back - Follow-up    MRI review    HPI Ryan Craig returns today to go over the MRI of his lumbar spine.  He continues to have low back pain with radicular symptoms down his left leg he states that the pain goes down into his left lower leg just below the knee.  Is worse with standing.  He has had therapy medications and despite this continues to have low back pain with radicular symptoms down the left leg.  He denies any radicular symptoms down the right leg. MRI MRI images are reviewed with the patient.  MRI shows L2-3 severe spinal stenosis and L3-4 severe spinal stenosis.  L4-5 disc extrusion with left L5 nerve root impingement in the left lateral recess.  Review of Systems See HPI.  Objective: Vital Signs: There were no vitals taken for this visit.  Physical Exam General well-developed well-nourished  male no acute distress Ortho Exam Bilateral lower extremities negative straight leg raise.  He has tenderness in the left lower lumbar paraspinous region. Specialty Comments:  No specialty comments available.  Imaging: No results found.   PMFS History: Patient Active Problem List   Diagnosis Date Noted   HLD (hyperlipidemia) 07/23/2020   Chest pain 07/22/2020   Chest pain of uncertain etiology    Essential hypertension 01/22/2019   Persistent cough 01/22/2019   Right bundle branch block 11/21/2018   Left anterior fascicular block 11/21/2018   Former smoker 11/21/2018   Obesity (BMI 30-39.9) 11/21/2018   Need for vaccination 09/27/2018   Osteoarthritis of left knee 12/22/2014   Status post total left knee replacement 12/22/2014   Acute medial meniscus tear of left knee 02/24/2014   Past Medical History:  Diagnosis Date   Anemia    no on any meds   Arthritis    Chronic back pain    deteriorating   Hyperlipidemia    was on Lipitor but was taken off 3-43yrs ago by medical md bc running liver enzymes up   Hypertension    takes Amlodipine and Quinapril daily   Joint pain    Joint swelling     Family History  Problem Relation  Age of Onset   Heart attack Mother    Cancer Maternal Aunt    Cancer Paternal Aunt    Cancer Cousin     Past Surgical History:  Procedure Laterality Date   arthroscopic knee surgery Right    COLONOSCOPY     INGUINAL HERNIA REPAIR Bilateral    x 4   JOINT REPLACEMENT Right 6 yrs ago   knee   KNEE ARTHROSCOPY Left 02/24/2014   Procedure: LEFT KNEE ARTHROSCOPY WITH PARTIAL MEDIAL MENISCECTOMY;  Surgeon: Mcarthur Rossetti, MD;  Location: [facility];  Service: Orthopedics;  Laterality: Left;   LEFT HEART CATH AND CORONARY ANGIOGRAPHY N/A 07/22/2020   Procedure: LEFT HEART CATH AND CORONARY ANGIOGRAPHY;  Surgeon: Burnell Blanks, MD;  Location: [facility];  Service: Cardiovascular;  Laterality: N/A;   TOTAL KNEE ARTHROPLASTY Left  12/22/2014   Procedure: LEFT TOTAL KNEE ARTHROPLASTY;  Surgeon: Mcarthur Rossetti, MD;  Location: [facility];  Service: Orthopedics;  Laterality: Left;   Social History   Occupational History   Occupation: retired  Tobacco Use   Smoking status: Former   Smokeless tobacco: Former   Tobacco comments:    quit smoking about 6-7ytrs ago  Scientific laboratory technician Use: Never used  Substance and Sexual Activity   Alcohol use: No    Comment: drank over 40 years ago   Drug use: No   Sexual activity: Yes

## 2021-08-29 ENCOUNTER — Other Ambulatory Visit: Payer: Self-pay | Admitting: Orthopaedic Surgery

## 2021-08-29 ENCOUNTER — Telehealth: Payer: Self-pay | Admitting: Orthopaedic Surgery

## 2021-08-29 MED ORDER — HYDROCODONE-ACETAMINOPHEN 5-325 MG PO TABS: 1.0000 | ORAL_TABLET | Freq: Four times a day (QID) | ORAL | 0 refills | Status: DC | PRN

## 2021-08-29 NOTE — Addendum Note (Signed)
Addended by: Robyne Peers on: 08/29/2021 09:14 AM   Modules accepted: Orders

## 2021-08-29 NOTE — Telephone Encounter (Signed)
Pt called and would like to know if he could get a refill on hydrocodone.

## 2021-09-05 ENCOUNTER — Ambulatory Visit (INDEPENDENT_AMBULATORY_CARE_PROVIDER_SITE_OTHER): Payer: Medicare Other | Admitting: Physician Assistant

## 2021-09-05 ENCOUNTER — Encounter: Payer: Self-pay | Admitting: Physician Assistant

## 2021-09-05 ENCOUNTER — Ambulatory Visit (INDEPENDENT_AMBULATORY_CARE_PROVIDER_SITE_OTHER): Payer: Medicare Other

## 2021-09-05 DIAGNOSIS — M25562 Pain in left knee: Secondary | ICD-10-CM | POA: Diagnosis not present

## 2021-09-05 DIAGNOSIS — M48062 Spinal stenosis, lumbar region with neurogenic claudication: Secondary | ICD-10-CM | POA: Diagnosis not present

## 2021-09-05 MED ORDER — HYDROCODONE-ACETAMINOPHEN 5-325 MG PO TABS
1.0000 | ORAL_TABLET | Freq: Four times a day (QID) | ORAL | 0 refills | Status: DC | PRN
Start: 2021-09-05 — End: 2021-09-21

## 2021-09-05 NOTE — Progress Notes (Signed)
Office Visit Note   Patient: Ryan Craig           Date of Birth: 29-Jul-1946           MRN: [id number] Visit Date: 09/05/2021              Requested by: [name], [address] [place],  [place] [address] PCP: [name], FNP   Assessment & Plan: Visit Diagnoses:  1. Acute pain of left knee   2. Spinal stenosis of lumbar region with neurogenic claudication     Plan: We will work on getting scheduled for the epidural steroid injection lumbar spine.  Refilled his pain medication he needs to use this sparingly.  See him back 2 weeks after the injection and see how he is doing overall.  Questions were encouraged and answered.  Reassurance was given that knee was stable on exam and there was no acute findings on the radiographs.  Follow-Up Instructions: Return 2 weeks after ESI.   Orders:  Orders Placed This Encounter  Procedures   XR Knee 1-2 Views Left   Meds ordered this encounter  Medications   HYDROcodone-acetaminophen (NORCO/VICODIN) 5-325 MG tablet    Sig: Take 1-2 tablets by mouth every 6 (six) hours as needed for moderate pain.    Dispense:  30 tablet    Refill:  0       Procedures: No procedures performed   Clinical Data: No additional findings.   Subjective: Chief Complaint  Patient presents with   Left Knee - Pain    HPI Ryan Craig returns today complaining of low back pain and left leg pain.  He is concerned there might be something wrong with his total knee replacement.  Said no new injury.  Unfortunately has not been set up for epidural steroid injection lumbar spine.  He is asking for refill on his pain medication today.  No radicular symptoms down the right leg. Review of Systems   Objective: Vital Signs: There were no vitals taken for this visit.  Physical Exam General well-developed well-nourished male no acute distress Ortho Exam Left knee: No abnormal warmth erythema.  Surgical incisions well-healed.  No  instability valgus varus stressing.  He has full range of motion of the knee.  Patella tracks well.  Calf supple nontender.  Positive straight leg raise on the left. Specialty Comments:  No specialty comments available.  Imaging: XR Knee 1-2 Views Left  Result Date: 09/05/2021 Left knee AP lateral views: Knee is well located.  Left total knee hardware is well seated.  No acute fractures bony abnormalities otherwise.    PMFS History: Patient Active Problem List   Diagnosis Date Noted   HLD (hyperlipidemia) 07/23/2020   Chest pain 07/22/2020   Chest pain of uncertain etiology    Essential hypertension 01/22/2019   Persistent cough 01/22/2019   Right bundle branch block 11/21/2018   Left anterior fascicular block 11/21/2018   Former smoker 11/21/2018   Obesity (BMI 30-39.9) 11/21/2018   Need for vaccination 09/27/2018   Osteoarthritis of left knee 12/22/2014   Status post total left knee replacement 12/22/2014   Acute medial meniscus tear of left knee 02/24/2014   Past Medical History:  Diagnosis Date   Anemia    no on any meds   Arthritis    Chronic back pain    deteriorating   Hyperlipidemia    was on Lipitor but was taken off 3-73yrs ago by medical md bc running liver enzymes  up   Hypertension    takes Amlodipine and Quinapril daily   Joint pain    Joint swelling     Family History  Problem Relation Age of Onset   Heart attack Mother    Cancer Maternal Aunt    Cancer Paternal Aunt    Cancer Cousin     Past Surgical History:  Procedure Laterality Date   arthroscopic knee surgery Right    COLONOSCOPY     INGUINAL HERNIA REPAIR Bilateral    x 4   JOINT REPLACEMENT Right 6 yrs ago   knee   KNEE ARTHROSCOPY Left 02/24/2014   Procedure: LEFT KNEE ARTHROSCOPY WITH PARTIAL MEDIAL MENISCECTOMY;  Surgeon: Mcarthur Rossetti, MD;  Location: [facility];  Service: Orthopedics;  Laterality: Left;   LEFT HEART CATH AND CORONARY ANGIOGRAPHY N/A 07/22/2020   Procedure: LEFT  HEART CATH AND CORONARY ANGIOGRAPHY;  Surgeon: Burnell Blanks, MD;  Location: [facility];  Service: Cardiovascular;  Laterality: N/A;   TOTAL KNEE ARTHROPLASTY Left 12/22/2014   Procedure: LEFT TOTAL KNEE ARTHROPLASTY;  Surgeon: Mcarthur Rossetti, MD;  Location: [facility];  Service: Orthopedics;  Laterality: Left;   Social History   Occupational History   Occupation: retired  Tobacco Use   Smoking status: Former   Smokeless tobacco: Former   Tobacco comments:    quit smoking about 6-7ytrs ago  Scientific laboratory technician Use: Never used  Substance and Sexual Activity   Alcohol use: No    Comment: drank over 40 years ago   Drug use: No   Sexual activity: Yes

## 2021-09-05 NOTE — Addendum Note (Signed)
Addended by: Robyne Peers on: 09/05/2021 11:28 AM   Modules accepted: Orders

## 2021-09-20 ENCOUNTER — Encounter: Payer: Self-pay | Admitting: Physical Medicine and Rehabilitation

## 2021-09-20 ENCOUNTER — Ambulatory Visit: Payer: Self-pay

## 2021-09-20 ENCOUNTER — Other Ambulatory Visit: Payer: Self-pay

## 2021-09-20 ENCOUNTER — Ambulatory Visit (INDEPENDENT_AMBULATORY_CARE_PROVIDER_SITE_OTHER): Payer: Medicare Other | Admitting: Physical Medicine and Rehabilitation

## 2021-09-20 VITALS — BP 133/81 | HR 92

## 2021-09-20 DIAGNOSIS — M5416 Radiculopathy, lumbar region: Secondary | ICD-10-CM | POA: Diagnosis not present

## 2021-09-20 MED ORDER — METHYLPREDNISOLONE ACETATE 80 MG/ML IJ SUSP
80.0000 mg | Freq: Once | INTRAMUSCULAR | Status: AC
  Administered 2021-09-20: 15:00:00 80 mg

## 2021-09-20 NOTE — Progress Notes (Signed)
Pt state lower back pain that travels to his left knee and calf. Pt state walking and standing for long period of time makes the pain worse. Pt state the pain keeps him up at night. Pt state he takes pain meds to help ease his pain.   Numeric Pain Rating Scale and Functional Assessment Average Pain 8   In the last MONTH (on 0-10 scale) has pain interfered with the following?  1. General activity like being  able to carry out your everyday physical activities such as walking, climbing stairs, carrying groceries, or moving a chair?  Rating(10)   +Driver, -BT, +Dye Allergies.

## 2021-09-20 NOTE — Patient Instructions (Signed)

## 2021-09-21 ENCOUNTER — Other Ambulatory Visit: Payer: Self-pay | Admitting: Physician Assistant

## 2021-09-21 ENCOUNTER — Telehealth: Payer: Self-pay | Admitting: Physician Assistant

## 2021-09-21 MED ORDER — HYDROCODONE-ACETAMINOPHEN 5-325 MG PO TABS: 1.0000 | ORAL_TABLET | Freq: Four times a day (QID) | ORAL | 0 refills | Status: DC | PRN

## 2021-09-21 NOTE — Telephone Encounter (Signed)
Please advise 

## 2021-09-21 NOTE — Telephone Encounter (Signed)
Pt calling to get a refill on his hydrocodone. Pt stated he had an appt yesterday with [facility] for an inj and attended it. The best pharmacy is [id number] - [place], [place] - [address] and the best call back number if needed is [phone].

## 2021-09-21 NOTE — Telephone Encounter (Signed)
Called pt and advised. He stated understanding  °

## 2021-09-28 ENCOUNTER — Ambulatory Visit: Payer: Medicare Other | Admitting: Nurse Practitioner

## 2021-10-09 NOTE — Progress Notes (Signed)
Ryan Craig - 75 y.o. male MRN [id number]  Date of birth: July 20, 1946  Office Visit Note: Visit Date: 09/20/2021 PCP: [name], FNP Referred by: [name], FNP  Subjective: Chief Complaint  Patient presents with   Lower Back - Pain   Right Knee - Pain   HPI:  Ryan Craig is a 75 y.o. male who comes in today at the request of Benita Stabile, PA-C for planned Left L3-4 Lumbar Transforaminal epidural steroid injection with fluoroscopic guidance.  The patient has failed conservative care including home exercise, medications, time and activity modification.  This injection will be diagnostic and hopefully therapeutic.  Please see requesting physician notes for further details and justification.  ROS Otherwise per HPI.  Assessment & Plan: Visit Diagnoses:    ICD-10-CM   1. Lumbar radiculopathy  M54.16 XR C-ARM NO REPORT    Epidural Steroid injection    methylPREDNISolone acetate (DEPO-MEDROL) injection 80 mg      Plan: No additional findings.   Meds & Orders:  Meds ordered this encounter  Medications   methylPREDNISolone acetate (DEPO-MEDROL) injection 80 mg    Orders Placed This Encounter  Procedures   XR C-ARM NO REPORT   Epidural Steroid injection    Follow-up: Return if symptoms worsen or fail to improve.   Procedures: No procedures performed  Lumbosacral Transforaminal Epidural Steroid Injection - Sub-Pedicular Approach with Fluoroscopic Guidance  Patient: Ryan Craig      Date of Birth: [birth year] MRN: [id number] PCP: [name], FNP      Visit Date: 09/20/2021   Universal Protocol:    Date/Time: 09/20/2021  Consent Given By: the patient  Position: PRONE  Additional Comments: Vital signs were monitored before and after the procedure. Patient was prepped and draped in the usual sterile fashion. The correct patient, procedure, and site was verified.   Injection Procedure Details:   Procedure diagnoses: Lumbar radiculopathy [M54.16]    Meds  Administered:  Meds ordered this encounter  Medications   methylPREDNISolone acetate (DEPO-MEDROL) injection 80 mg    Laterality: Left  Location/Site: L3  Needle:5.0 in., 22 ga.  Short bevel or Quincke spinal needle  Needle Placement: Transforaminal  Findings:    -Comments: Excellent flow of contrast along the nerve, nerve root and into the epidural space.  Procedure Details: After squaring off the end-plates to get a true AP view, the C-arm was positioned so that an oblique view of the foramen as noted above was visualized. The target area is just inferior to the "nose of the scotty dog" or sub pedicular. The soft tissues overlying this structure were infiltrated with 2-3 ml. of 1% Lidocaine without Epinephrine.  The spinal needle was inserted toward the target using a "trajectory" view along the fluoroscope beam.  Under AP and lateral visualization, the needle was advanced so it did not puncture dura and was located close the 6 O'Clock position of the pedical in AP tracterory. Biplanar projections were used to confirm position. Aspiration was confirmed to be negative for CSF and/or blood. A 1-2 ml. volume of Isovue-250 was injected and flow of contrast was noted at each level. Radiographs were obtained for documentation purposes.   After attaining the desired flow of contrast documented above, a 0.5 to 1.0 ml test dose of 0.25% Marcaine was injected into each respective transforaminal space.  The patient was observed for 90 seconds post injection.  After no sensory deficits were reported, and normal lower extremity motor function was noted,   the above injectate was  administered so that equal amounts of the injectate were placed at each foramen (level) into the transforaminal epidural space.   Additional Comments:  No complications occurred Dressing: 2 x 2 sterile gauze and Band-Aid    Post-procedure details: Patient was observed during the procedure. Post-procedure instructions  were reviewed.  Patient left the clinic in stable condition.    Clinical History: MRI LUMBAR SPINE WITHOUT CONTRAST   TECHNIQUE: Multiplanar, multisequence MR imaging of the lumbar spine was performed. No intravenous contrast was administered.   COMPARISON:  Lumbar spine MRI 01/14/2011   FINDINGS: Segmentation:  Standard.   Alignment:  Trace retrolisthesis of L5 on S1.   Vertebrae: No fracture or suspicious marrow lesion. Mild multilevel degenerative endplate changes.   Conus medullaris and cauda equina: Conus extends to the L1 level. Conus and cauda equina appear normal.   Paraspinal and other soft tissues: Unremarkable.   Disc levels:   Disc desiccation and disc space narrowing throughout the lumbar spine including moderate narrowing from L3-4 to L5-S1. 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   L1-2: Disc bulging, a new right subarticular disc protrusion, and mild facet and ligamentum flavum hypertrophy result in new mild spinal stenosis, mild right lateral recess stenosis, and mild right neural foraminal stenosis.   L2-3: Disc bulging and moderate facet and ligamentum flavum hypertrophy result in severe spinal stenosis and mild right greater than left neural foraminal stenosis, progressed from prior.   L3-4: Disc bulging, a central disc protrusion, and mild facet and ligamentum flavum hypertrophy result in severe spinal stenosis and moderate bilateral neural foraminal stenosis, progressed from prior.   L4-5: Disc bulging, a moderate-sized left paracentral disc extrusion, and mild facet and ligamentum flavum hypertrophy result in mild spinal stenosis, mild-to-moderate right and severe left lateral recess stenosis, and mild left neural foraminal stenosis. The disc extrusion is smaller than on the prior study, however there is persistent left L5 nerve root impingement in the left lateral recess.   L5-S1: Disc bulging, a  small chronic left paracentral to subarticular disc extrusion with slight caudal migration, and mild facet hypertrophy result in mild-to-moderate bilateral lateral recess stenosis and mild bilateral neural foraminal stenosis, mildly progressed. No spinal stenosis.   IMPRESSION: 1. Progressive diffuse lumbar disc and facet degeneration with severe spinal stenosis at L2-3 and L3-4. 2. Decreased size of L4-5 disc extrusion but with persistent severe left lateral recess stenosis.     Electronically Signed   By: Logan Bores M.D.   On: 08/22/2021 16:20     Objective:  VS:  HT:     WT:    BMI:      BP:133/81   HR:92bpm   TEMP: ( )   RESP:  Physical Exam Vitals and nursing note reviewed.  Constitutional:      General: He is not in acute distress.    Appearance: Normal appearance. He is obese. He is not ill-appearing.  HENT:     Head: Normocephalic and atraumatic.     Right Ear: External ear normal.     Left Ear: External ear normal.     Nose: No congestion.  Eyes:     Extraocular Movements: Extraocular movements intact.  Cardiovascular:     Rate and Rhythm: Normal rate.     Pulses: Normal pulses.  Pulmonary:     Effort: Pulmonary effort is normal. No respiratory distress.  Abdominal:     General: There is no distension.     Palpations: Abdomen  is soft.  Musculoskeletal:        General: No tenderness or signs of injury.     Cervical back: Neck supple.     Right lower leg: No edema.     Left lower leg: No edema.     Comments: Patient has good distal strength without clonus.  Skin:    Findings: No erythema or rash.  Neurological:     General: No focal deficit present.     Mental Status: He is alert and oriented to person, place, and time.     Sensory: No sensory deficit.     Motor: No weakness or abnormal muscle tone.     Coordination: Coordination normal.  Psychiatric:        Mood and Affect: Mood normal.        Behavior: Behavior normal.     Imaging: No results  found.

## 2021-10-09 NOTE — Procedures (Signed)
Lumbosacral Transforaminal Epidural Steroid Injection - Sub-Pedicular Approach with Fluoroscopic Guidance  Patient: Ryan Craig      Date of Birth: 05-Jun-1946 MRN: [id number] PCP: [name], FNP      Visit Date: 09/20/2021   Universal Protocol:    Date/Time: 09/20/2021  Consent Given By: the patient  Position: PRONE  Additional Comments: Vital signs were monitored before and after the procedure. Patient was prepped and draped in the usual sterile fashion. The correct patient, procedure, and site was verified.   Injection Procedure Details:   Procedure diagnoses: Lumbar radiculopathy [M54.16]    Meds Administered:  Meds ordered this encounter  Medications   methylPREDNISolone acetate (DEPO-MEDROL) injection 80 mg    Laterality: Left  Location/Site: L3  Needle:5.0 in., 22 ga.  Short bevel or Quincke spinal needle  Needle Placement: Transforaminal  Findings:    -Comments: Excellent flow of contrast along the nerve, nerve root and into the epidural space.  Procedure Details: After squaring off the end-plates to get a true AP view, the C-arm was positioned so that an oblique view of the foramen as noted above was visualized. The target area is just inferior to the "nose of the scotty dog" or sub pedicular. The soft tissues overlying this structure were infiltrated with 2-3 ml. of 1% Lidocaine without Epinephrine.  The spinal needle was inserted toward the target using a "trajectory" view along the fluoroscope beam.  Under AP and lateral visualization, the needle was advanced so it did not puncture dura and was located close the 6 O'Clock position of the pedical in AP tracterory. Biplanar projections were used to confirm position. Aspiration was confirmed to be negative for CSF and/or blood. A 1-2 ml. volume of Isovue-250 was injected and flow of contrast was noted at each level. Radiographs were obtained for documentation purposes.   After attaining the desired flow of  contrast documented above, a 0.5 to 1.0 ml test dose of 0.25% Marcaine was injected into each respective transforaminal space.  The patient was observed for 90 seconds post injection.  After no sensory deficits were reported, and normal lower extremity motor function was noted,   the above injectate was administered so that equal amounts of the injectate were placed at each foramen (level) into the transforaminal epidural space.   Additional Comments:  No complications occurred Dressing: 2 x 2 sterile gauze and Band-Aid    Post-procedure details: Patient was observed during the procedure. Post-procedure instructions were reviewed.  Patient left the clinic in stable condition.

## 2021-11-01 ENCOUNTER — Other Ambulatory Visit: Payer: Self-pay | Admitting: Orthopaedic Surgery

## 2021-11-01 ENCOUNTER — Telehealth: Payer: Self-pay | Admitting: Physical Medicine and Rehabilitation

## 2021-11-01 ENCOUNTER — Telehealth: Payer: Self-pay | Admitting: Orthopaedic Surgery

## 2021-11-01 DIAGNOSIS — M5442 Lumbago with sciatica, left side: Secondary | ICD-10-CM

## 2021-11-01 DIAGNOSIS — G8929 Other chronic pain: Secondary | ICD-10-CM

## 2021-11-01 MED ORDER — HYDROCODONE-ACETAMINOPHEN 5-325 MG PO TABS: 1.0000 | ORAL_TABLET | Freq: Three times a day (TID) | ORAL | 0 refills | Status: DC | PRN

## 2021-11-01 NOTE — Telephone Encounter (Signed)
Called and advised pt.

## 2021-11-01 NOTE — Telephone Encounter (Signed)
Patient called asked if he can be referred back to Dr. Ernestina Patches for an injection in his back?  Patient also advised needing Rx refilled for Hydrocodone. The number to contact patient is [phone]

## 2021-11-01 NOTE — Telephone Encounter (Signed)
Referral placed in chart  

## 2021-11-14 ENCOUNTER — Ambulatory Visit (INDEPENDENT_AMBULATORY_CARE_PROVIDER_SITE_OTHER): Payer: Medicare Other

## 2021-11-14 ENCOUNTER — Other Ambulatory Visit: Payer: Self-pay

## 2021-11-14 ENCOUNTER — Encounter: Payer: Self-pay | Admitting: Physician Assistant

## 2021-11-14 ENCOUNTER — Ambulatory Visit (INDEPENDENT_AMBULATORY_CARE_PROVIDER_SITE_OTHER): Payer: Medicare Other | Admitting: Physician Assistant

## 2021-11-14 DIAGNOSIS — M25512 Pain in left shoulder: Secondary | ICD-10-CM | POA: Diagnosis not present

## 2021-11-14 MED ORDER — LIDOCAINE HCL 1 % IJ SOLN
3.0000 mL | INTRAMUSCULAR | Status: AC | PRN
  Administered 2021-11-14: 3 mL

## 2021-11-14 MED ORDER — METHYLPREDNISOLONE ACETATE 40 MG/ML IJ SUSP
40.0000 mg | INTRAMUSCULAR | Status: AC | PRN
  Administered 2021-11-14: 40 mg via INTRA_ARTICULAR

## 2021-11-14 NOTE — Progress Notes (Signed)
Office Visit Note   Patient: Ryan Craig           Date of Birth: 25-May-1946           MRN: [id number] Visit Date: 11/14/2021              Requested by: Ryan Craig, [address] [place],  [place] [id number] PCP: Ryan Brine, FNP   Assessment & Plan: Visit Diagnoses:  1. Acute pain of left shoulder     Plan: He shown pendulum, Codman, forward flexion and wall crawls.  He will start performing these on Wednesday on his own.  Follow-up visit 2 weeks see how he is doing overall.  Questions encouraged and answered at length  Follow-Up Instructions: Return in about 2 weeks (around 11/28/2021).   Orders:  Orders Placed This Encounter  Procedures   Large Joint Inj   XR Shoulder Left   No orders of the defined types were placed in this encounter.     Procedures: Large Joint Inj: L subacromial bursa on 11/14/2021 9:20 AM Indications: pain Details: 22 G 1.5 in needle, superior approach  Arthrogram: No  Medications: 3 mL lidocaine 1 %; 40 mg methylPREDNISolone acetate 40 MG/ML Outcome: tolerated well, no immediate complications Procedure, treatment alternatives, risks and benefits explained, specific risks discussed. Consent was given by the patient. Immediately prior to procedure a time out was called to verify the correct patient, procedure, equipment, support staff and site/side marked as required. Patient was prepped and draped in the usual sterile fashion.      Clinical Data: No additional findings.   Subjective: Chief Complaint  Patient presents with   Left Shoulder - Pain    HPI Ryan Craig 76 year old male well-known to our department service comes in today with left shoulder pain this been ongoing for the past 2 weeks no known injury.  However he was pulling up some old collard greens and placing some items up in the attic over the last couple weeks.  Denies any numbness tingling down the arm no neck pain.  Has tried Tylenol without any real  relief.  He is nondiabetic.  Review of Systems Negative for fevers chills.  Objective: Vital Signs: There were no vitals taken for this visit.  Physical Exam Constitutional:      Appearance: He is not ill-appearing or diaphoretic.  Pulmonary:     Effort: Pulmonary effort is normal.  Neurological:     Mental Status: He is alert and oriented to person, place, and time.  Psychiatric:        Mood and Affect: Mood normal.    Ortho Exam Left shoulder decreased forward flexion actively and passively secondary to pain.  5 out of 5 strength with external and internal rotation against resistance.  Unable to test for liftoff on the left secondary to pain.  Also unable to do empty can test on the left secondary to pain.  Passive range of motion of the shoulder reveals decreased external and internal rotation no significant crepitus appreciated. Specialty Comments:  No specialty comments available.  Imaging: XR Shoulder Left  Result Date: 11/14/2021 Left shoulder 3 views: Glenohumeral joints well-maintained.  No acute fractures.  Shoulder is well located.  No acute.    PMFS History: Patient Active Problem List   Diagnosis Date Noted   HLD (hyperlipidemia) 07/23/2020   Chest pain 07/22/2020   Chest pain of uncertain etiology    Essential hypertension 01/22/2019   Persistent cough 01/22/2019   Right  bundle branch block 11/21/2018   Left anterior fascicular block 11/21/2018   Former smoker 11/21/2018   Obesity (BMI 30-39.9) 11/21/2018   Need for vaccination 09/27/2018   Osteoarthritis of left knee 12/22/2014   Status post total left knee replacement 12/22/2014   Acute medial meniscus tear of left knee 02/24/2014   Past Medical History:  Diagnosis Date   Anemia    no on any meds   Arthritis    Chronic back pain    deteriorating   Hyperlipidemia    was on Lipitor but was taken off 3-1yrs ago by medical md bc running liver enzymes up   Hypertension    takes Amlodipine and  Quinapril daily   Joint pain    Joint swelling     Family History  Problem Relation Age of Onset   Heart attack Mother    Cancer Maternal Aunt    Cancer Paternal Aunt    Cancer Cousin     Past Surgical History:  Procedure Laterality Date   arthroscopic knee surgery Right    COLONOSCOPY     INGUINAL HERNIA REPAIR Bilateral    x 4   JOINT REPLACEMENT Right 6 yrs ago   knee   KNEE ARTHROSCOPY Left 02/24/2014   Procedure: LEFT KNEE ARTHROSCOPY WITH PARTIAL MEDIAL MENISCECTOMY;  Surgeon: Mcarthur Rossetti, MD;  Location: [facility];  Service: Orthopedics;  Laterality: Left;   LEFT HEART CATH AND CORONARY ANGIOGRAPHY N/A 07/22/2020   Procedure: LEFT HEART CATH AND CORONARY ANGIOGRAPHY;  Surgeon: Burnell Blanks, MD;  Location: [facility];  Service: Cardiovascular;  Laterality: N/A;   TOTAL KNEE ARTHROPLASTY Left 12/22/2014   Procedure: LEFT TOTAL KNEE ARTHROPLASTY;  Surgeon: Mcarthur Rossetti, MD;  Location: [facility];  Service: Orthopedics;  Laterality: Left;   Social History   Occupational History   Occupation: retired  Tobacco Use   Smoking status: Former   Smokeless tobacco: Former   Tobacco comments:    quit smoking about 6-7ytrs ago  Scientific laboratory technician Use: Never used  Substance and Sexual Activity   Alcohol use: No    Comment: drank over 40 years ago   Drug use: No   Sexual activity: Yes

## 2021-11-20 ENCOUNTER — Other Ambulatory Visit: Payer: Self-pay | Admitting: Nurse Practitioner

## 2021-11-28 ENCOUNTER — Ambulatory Visit (INDEPENDENT_AMBULATORY_CARE_PROVIDER_SITE_OTHER): Payer: Medicare Other | Admitting: Physician Assistant

## 2021-11-28 ENCOUNTER — Other Ambulatory Visit: Payer: Self-pay | Admitting: Nurse Practitioner

## 2021-11-28 ENCOUNTER — Other Ambulatory Visit: Payer: Self-pay

## 2021-11-28 ENCOUNTER — Encounter: Payer: Self-pay | Admitting: Physician Assistant

## 2021-11-28 DIAGNOSIS — M25512 Pain in left shoulder: Secondary | ICD-10-CM | POA: Diagnosis not present

## 2021-11-28 DIAGNOSIS — I10 Essential (primary) hypertension: Secondary | ICD-10-CM

## 2021-11-28 MED ORDER — LISINOPRIL 10 MG PO TABS: 10.0000 mg | ORAL_TABLET | Freq: Every day | ORAL | 1 refills | Status: DC

## 2021-11-28 NOTE — Progress Notes (Signed)
HPI: Ryan Craig returns today status post left shoulder subacromial injection.  He states overall he is doing well.  Did ask him about exercises he is doing he is is not performing the exercises as shown.  A he does have some popping of the shoulder which is painful.  He states this comes and goes.  Ranks his pain to be 5 out of 10 pain at worst.   Physical exam: General well-developed well-nourished male no acute distress mood affect appropriate.  Bilateral shoulders 5 out of 5 strength with external and internal rotation against resistance.  Negative liftoff test bilaterally.  Negative empty can test bilaterally.  Passively I can bring his left arm above his head to approximately 160s to 170 degrees with some discomfort.  Impression: Left shoulder impingement  Plan: Given the fact the patient has had some improvement in his motion and the fact that I am able to assess his rotator cuff today and the cuff appears to be intact recommend physical therapy work on range of motion strengthening the shoulder.  Him about follow-up with [place] in 4 to 6 weeks see how he is doing overall.  Questions were encouraged and answered at length today.  Reviewed home exercise program with him again today regards to his left shoulder.  Therapy will work on range of motion of his left shoulder, strengthening, home exercise program and modalities.

## 2021-11-28 NOTE — Addendum Note (Signed)
Addended by: Robyne Peers on: 11/28/2021 01:22 PM   Modules accepted: Orders

## 2021-12-05 ENCOUNTER — Encounter: Payer: Self-pay | Admitting: Physical Medicine and Rehabilitation

## 2021-12-05 ENCOUNTER — Ambulatory Visit (INDEPENDENT_AMBULATORY_CARE_PROVIDER_SITE_OTHER): Payer: Medicare Other | Admitting: Physical Medicine and Rehabilitation

## 2021-12-05 ENCOUNTER — Ambulatory Visit: Payer: Self-pay

## 2021-12-05 ENCOUNTER — Other Ambulatory Visit: Payer: Self-pay

## 2021-12-05 VITALS — BP 116/63 | HR 73

## 2021-12-05 DIAGNOSIS — M5416 Radiculopathy, lumbar region: Secondary | ICD-10-CM

## 2021-12-05 MED ORDER — DEXAMETHASONE SODIUM PHOSPHATE 10 MG/ML IJ SOLN
15.0000 mg | Freq: Once | INTRAMUSCULAR | Status: AC
  Administered 2021-12-05: 15 mg

## 2021-12-05 NOTE — Progress Notes (Signed)
Ryan Craig - 76 y.o. male MRN [id number]  Date of birth: [birth year]  Office Visit Note: Visit Date: 12/05/2021 PCP: [name], FNP Referred by: [name], FNP  Subjective: Chief Complaint  Patient presents with   Lower Back - Pain   Left Knee - Pain   HPI:  Ryan Craig is a 76 y.o. male who comes in today for planned repeat Left L3-4  Lumbar Transforaminal epidural steroid injection with fluoroscopic guidance.  The patient has failed conservative care including home exercise, medications, time and activity modification.  This injection will be diagnostic and hopefully therapeutic.  Please see requesting physician notes for further details and justification. Patient received more than 50% pain relief from prior injection.   Referring: Dr. Jean Rosenthal   ROS Otherwise per HPI.  Assessment & Plan: Visit Diagnoses:    ICD-10-CM   1. Lumbar radiculopathy  M54.16 XR C-ARM NO REPORT    Epidural Steroid injection    dexamethasone (DECADRON) injection 15 mg      Plan: No additional findings.   Meds & Orders:  Meds ordered this encounter  Medications   dexamethasone (DECADRON) injection 15 mg    Orders Placed This Encounter  Procedures   XR C-ARM NO REPORT   Epidural Steroid injection    Follow-up: Return for visit to requesting provider as needed.   Procedures: No procedures performed  Lumbosacral Transforaminal Epidural Steroid Injection - Sub-Pedicular Approach with Fluoroscopic Guidance  Patient: Ryan Craig      Date of Birth: 1946/03/31 MRN: [id number] PCP: [name], FNP      Visit Date: 12/05/2021   Universal Protocol:    Date/Time: 12/05/2021  Consent Given By: the patient  Position: PRONE  Additional Comments: Vital signs were monitored before and after the procedure. Patient was prepped and draped in the usual sterile fashion. The correct patient, procedure, and site was verified.   Injection Procedure Details:   Procedure  diagnoses: Lumbar radiculopathy [M54.16]    Meds Administered:  Meds ordered this encounter  Medications   dexamethasone (DECADRON) injection 15 mg    Laterality: Left  Location/Site: L3  Needle:5.0 in., 22 ga.  Short bevel or Quincke spinal needle  Needle Placement: Transforaminal  Findings:    -Comments: Excellent flow of contrast along the nerve, nerve root and into the epidural space.  Procedure Details: After squaring off the end-plates to get a true AP view, the C-arm was positioned so that an oblique view of the foramen as noted above was visualized. The target area is just inferior to the "nose of the scotty dog" or sub pedicular. The soft tissues overlying this structure were infiltrated with 2-3 ml. of 1% Lidocaine without Epinephrine.  The spinal needle was inserted toward the target using a "trajectory" view along the fluoroscope beam.  Under AP and lateral visualization, the needle was advanced so it did not puncture dura and was located close the 6 O'Clock position of the pedical in AP tracterory. Biplanar projections were used to confirm position. Aspiration was confirmed to be negative for CSF and/or blood. A 1-2 ml. volume of Isovue-250 was injected and flow of contrast was noted at each level. Radiographs were obtained for documentation purposes.   After attaining the desired flow of contrast documented above, a 0.5 to 1.0 ml test dose of 0.25% Marcaine was injected into each respective transforaminal space.  The patient was observed for 90 seconds post injection.  After no sensory deficits were reported, and normal lower extremity motor  function was noted,   the above injectate was administered so that equal amounts of the injectate were placed at each foramen (level) into the transforaminal epidural space.   Additional Comments:  The patient tolerated the procedure well Dressing: 2 x 2 sterile gauze and Band-Aid    Post-procedure details: Patient was observed  during the procedure. Post-procedure instructions were reviewed.  Patient left the clinic in stable condition.    Clinical History: MRI LUMBAR SPINE WITHOUT CONTRAST   TECHNIQUE: Multiplanar, multisequence MR imaging of the lumbar spine was performed. No intravenous contrast was administered.   COMPARISON:  Lumbar spine MRI 01/14/2011   FINDINGS: Segmentation:  Standard.   Alignment:  Trace retrolisthesis of L5 on S1.   Vertebrae: No fracture or suspicious marrow lesion. Mild multilevel degenerative endplate changes.   Conus medullaris and cauda equina: Conus extends to the L1 level. Conus and cauda equina appear normal.   Paraspinal and other soft tissues: Unremarkable.   Disc levels:   Disc desiccation and disc space narrowing throughout the lumbar spine including moderate narrowing from L3-4 to L5-S1. 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   L1-2: Disc bulging, a new right subarticular disc protrusion, and mild facet and ligamentum flavum hypertrophy result in new mild spinal stenosis, mild right lateral recess stenosis, and mild right neural foraminal stenosis.   L2-3: Disc bulging and moderate facet and ligamentum flavum hypertrophy result in severe spinal stenosis and mild right greater than left neural foraminal stenosis, progressed from prior.   L3-4: Disc bulging, a central disc protrusion, and mild facet and ligamentum flavum hypertrophy result in severe spinal stenosis and moderate bilateral neural foraminal stenosis, progressed from prior.   L4-5: Disc bulging, a moderate-sized left paracentral disc extrusion, and mild facet and ligamentum flavum hypertrophy result in mild spinal stenosis, mild-to-moderate right and severe left lateral recess stenosis, and mild left neural foraminal stenosis. The disc extrusion is smaller than on the prior study, however there is persistent left L5 nerve root impingement in the  left lateral recess.   L5-S1: Disc bulging, a small chronic left paracentral to subarticular disc extrusion with slight caudal migration, and mild facet hypertrophy result in mild-to-moderate bilateral lateral recess stenosis and mild bilateral neural foraminal stenosis, mildly progressed. No spinal stenosis.   IMPRESSION: 1. Progressive diffuse lumbar disc and facet degeneration with severe spinal stenosis at L2-3 and L3-4. 2. Decreased size of L4-5 disc extrusion but with persistent severe left lateral recess stenosis.     Electronically Signed   By: Logan Bores M.D.   On: 08/22/2021 16:20     Objective:  VS:  HT:     WT:    BMI:      BP:116/63   HR:73bpm   TEMP: ( )   RESP:  Physical Exam Vitals and nursing note reviewed.  Constitutional:      General: He is not in acute distress.    Appearance: Normal appearance. He is not ill-appearing.  HENT:     Head: Normocephalic and atraumatic.     Right Ear: External ear normal.     Left Ear: External ear normal.     Nose: No congestion.  Eyes:     Extraocular Movements: Extraocular movements intact.  Cardiovascular:     Rate and Rhythm: Normal rate.     Pulses: Normal pulses.  Pulmonary:     Effort: Pulmonary effort is normal. No respiratory distress.  Abdominal:     General: There  is no distension.     Palpations: Abdomen is soft.  Musculoskeletal:        General: No tenderness or signs of injury.     Cervical back: Neck supple.     Right lower leg: No edema.     Left lower leg: No edema.     Comments: Patient has good distal strength without clonus.  Skin:    Findings: No erythema or rash.  Neurological:     General: No focal deficit present.     Mental Status: He is alert and oriented to person, place, and time.     Sensory: No sensory deficit.     Motor: No weakness or abnormal muscle tone.     Coordination: Coordination normal.  Psychiatric:        Mood and Affect: Mood normal.        Behavior: Behavior  normal.     Imaging: No results found.

## 2021-12-05 NOTE — Patient Instructions (Signed)

## 2021-12-05 NOTE — Progress Notes (Signed)
Pt state lower back pain that travels to his left knee and calf. Pt state the pain has been traveling to his right side. Pt state walking and standing for long period of time makes the pain worse. Pt state the pain keeps him up at night. Pt state he takes pain meds to help ease his pain.   Numeric Pain Rating Scale and Functional Assessment Average Pain 8   In the last MONTH (on 0-10 scale) has pain interfered with the following?  1. General activity like being  able to carry out your everyday physical activities such as walking, climbing stairs, carrying groceries, or moving a chair?  Rating(10)   +Driver, -BT, -Dye Allergies.

## 2021-12-07 ENCOUNTER — Ambulatory Visit (INDEPENDENT_AMBULATORY_CARE_PROVIDER_SITE_OTHER): Payer: Medicare Other

## 2021-12-07 ENCOUNTER — Ambulatory Visit (INDEPENDENT_AMBULATORY_CARE_PROVIDER_SITE_OTHER): Payer: Medicare Other | Admitting: Nurse Practitioner

## 2021-12-07 ENCOUNTER — Other Ambulatory Visit: Payer: Self-pay

## 2021-12-07 ENCOUNTER — Encounter: Payer: Self-pay | Admitting: Nurse Practitioner

## 2021-12-07 VITALS — BP 128/70 | HR 83 | Temp 98.1°F | Ht 67.0 in | Wt 201.0 lb

## 2021-12-07 DIAGNOSIS — Z6831 Body mass index (BMI) 31.0-31.9, adult: Secondary | ICD-10-CM

## 2021-12-07 DIAGNOSIS — G8929 Other chronic pain: Secondary | ICD-10-CM

## 2021-12-07 DIAGNOSIS — I10 Essential (primary) hypertension: Secondary | ICD-10-CM

## 2021-12-07 DIAGNOSIS — M25512 Pain in left shoulder: Secondary | ICD-10-CM | POA: Diagnosis not present

## 2021-12-07 DIAGNOSIS — E7800 Pure hypercholesterolemia, unspecified: Secondary | ICD-10-CM

## 2021-12-07 DIAGNOSIS — Z0000 Encounter for general adult medical examination without abnormal findings: Secondary | ICD-10-CM | POA: Diagnosis not present

## 2021-12-07 DIAGNOSIS — E6609 Other obesity due to excess calories: Secondary | ICD-10-CM | POA: Diagnosis not present

## 2021-12-07 LAB — LIPID PANEL
Chol/HDL Ratio: 2.2 ratio (ref 0.0–5.0)
Cholesterol, Total: 151 mg/dL (ref 100–199)
HDL: 69 mg/dL (ref >39)
LDL Chol Calc (NIH): 63 mg/dL (ref 0–99)
Triglycerides: 105 mg/dL (ref 0–149)
VLDL Cholesterol Cal: 19 mg/dL (ref 5–40)

## 2021-12-07 LAB — BMP8+EGFR
BUN/Creatinine Ratio: 22 (ref 10–24)
BUN: 19 mg/dL (ref 8–27)
CO2: 22 mmol/L (ref 20–29)
Calcium: 10.2 mg/dL (ref 8.6–10.2)
Chloride: 101 mmol/L (ref 96–106)
Creatinine, Ser: 0.85 mg/dL (ref 0.76–1.27)
Glucose: 107 mg/dL — ABNORMAL HIGH (ref 70–99)
Potassium: 5.1 mmol/L (ref 3.5–5.2)
Sodium: 139 mmol/L (ref 134–144)
eGFR: 89 mL/min/{1.73_m2} (ref >59)

## 2021-12-07 NOTE — Procedures (Signed)
Lumbosacral Transforaminal Epidural Steroid Injection - Sub-Pedicular Approach with Fluoroscopic Guidance  Patient: Ryan Craig      Date of Birth: 1946/05/23 MRN: [id number] PCP: [name], FNP      Visit Date: 12/05/2021   Universal Protocol:    Date/Time: 12/05/2021  Consent Given By: the patient  Position: PRONE  Additional Comments: Vital signs were monitored before and after the procedure. Patient was prepped and draped in the usual sterile fashion. The correct patient, procedure, and site was verified.   Injection Procedure Details:   Procedure diagnoses: Lumbar radiculopathy [M54.16]    Meds Administered:  Meds ordered this encounter  Medications   dexamethasone (DECADRON) injection 15 mg    Laterality: Left  Location/Site: L3  Needle:5.0 in., 22 ga.  Short bevel or Quincke spinal needle  Needle Placement: Transforaminal  Findings:    -Comments: Excellent flow of contrast along the nerve, nerve root and into the epidural space.  Procedure Details: After squaring off the end-plates to get a true AP view, the C-arm was positioned so that an oblique view of the foramen as noted above was visualized. The target area is just inferior to the "nose of the scotty dog" or sub pedicular. The soft tissues overlying this structure were infiltrated with 2-3 ml. of 1% Lidocaine without Epinephrine.  The spinal needle was inserted toward the target using a "trajectory" view along the fluoroscope beam.  Under AP and lateral visualization, the needle was advanced so it did not puncture dura and was located close the 6 O'Clock position of the pedical in AP tracterory. Biplanar projections were used to confirm position. Aspiration was confirmed to be negative for CSF and/or blood. A 1-2 ml. volume of Isovue-250 was injected and flow of contrast was noted at each level. Radiographs were obtained for documentation purposes.   After attaining the desired flow of contrast  documented above, a 0.5 to 1.0 ml test dose of 0.25% Marcaine was injected into each respective transforaminal space.  The patient was observed for 90 seconds post injection.  After no sensory deficits were reported, and normal lower extremity motor function was noted,   the above injectate was administered so that equal amounts of the injectate were placed at each foramen (level) into the transforaminal epidural space.   Additional Comments:  The patient tolerated the procedure well Dressing: 2 x 2 sterile gauze and Band-Aid    Post-procedure details: Patient was observed during the procedure. Post-procedure instructions were reviewed.  Patient left the clinic in stable condition.

## 2021-12-07 NOTE — Progress Notes (Signed)
I,Ryan Craig,acting as a Education administrator for [employer], FNP.,have documented all relevant documentation on the behalf of Ryan Brine, FNP,as directed by  Ryan Brine, FNP while in the presence of Ryan Craig, [place].  This visit occurred during the SARS-CoV-2 public health emergency.  Safety protocols were in place, including screening questions prior to the visit, additional usage of staff PPE, and extensive cleaning of exam room while observing appropriate contact time as indicated for disinfecting solutions.  Subjective:     Patient ID: Ryan Craig , male    DOB: [birth year] , 76 y.o.   MRN: [id number]   Chief Complaint  Patient presents with   Hypertension    HPI  Patient is here for htn follow up. He reports compliance with medications, he was able to pick up his new medicine.    Hypertension This is a chronic problem. The current episode started more than 1 year ago. The problem is unchanged. The problem is controlled. Pertinent negatives include no chest pain, headaches, palpitations or shortness of breath. There are no associated agents to hypertension. Risk factors for coronary artery disease include dyslipidemia, obesity and male gender. Past treatments include ACE inhibitors and calcium channel blockers. The current treatment provides significant improvement. There are no compliance problems.  There is no history of angina. There is no history of chronic renal disease.    Past Medical History:  Diagnosis Date   Anemia    no on any meds   Arthritis    Chronic back pain    deteriorating   Hyperlipidemia    was on Lipitor but was taken off 3-30yr ago by medical md bc running liver enzymes up   Hypertension    takes Amlodipine and Quinapril daily   Joint pain    Joint swelling      Family History  Problem Relation Age of Onset   Heart attack Mother    Cancer Maternal Aunt    Cancer Paternal Aunt    Cancer Cousin      Current Outpatient Medications:    acetaminophen  (TYLENOL) 500 MG tablet, Take 500 mg by mouth every 6 (six) hours as needed., Disp: , Rfl:    amLODipine (NORVASC) 10 MG tablet, TAKE 1 TABLET BY MOUTH     DAILY, Disp: 90 tablet, Rfl: 3   aspirin EC 81 MG tablet, Take 81 mg by mouth daily., Disp: , Rfl:    baclofen (LIORESAL) 10 MG tablet, Take 1 tablet (10 mg total) by mouth 3 (three) times daily as needed for muscle spasms., Disp: 30 each, Rfl: 0   BLACK CURRANT SEED OIL PO, Take by mouth., Disp: , Rfl:    Cholecalciferol (VITAMIN D-3 PO), Take 2 tablets by mouth daily. , Disp: , Rfl:    diazepam (VALIUM) 5 MG tablet, TAKE ONE TAB ONE HOUR PRIOR TO INJECTION REPEAT AS NEEDED #2 . ZERO REFILLS, Disp: 2 tablet, Rfl: 0   diclofenac Sodium (VOLTAREN) 1 % GEL, Apply 2 g topically 4 (four) times daily. (Patient not taking: Reported on 12/07/2021), Disp: 100 g, Rfl: 2   HYDROcodone-acetaminophen (NORCO/VICODIN) 5-325 MG tablet, Take 1 tablet by mouth 3 (three) times daily as needed for moderate pain. (Patient not taking: Reported on 12/07/2021), Disp: 40 tablet, Rfl: 0   lisinopril (ZESTRIL) 10 MG tablet, Take 1 tablet (10 mg total) by mouth daily., Disp: 90 tablet, Rfl: 1   methylPREDNISolone (MEDROL) 4 MG tablet, Medrol dose pack. Take as instructed (Patient not taking: Reported on 12/07/2021), Disp: 21  tablet, Rfl: 0   metoprolol succinate (TOPROL-XL) 25 MG 24 hr tablet, Take 1 tablet (25 mg total) by mouth daily., Disp: 90 tablet, Rfl: 3   rosuvastatin (CRESTOR) 40 MG tablet, TAKE 1 TABLET BY MOUTH EVERY DAY (Patient taking differently: Takes every other day), Disp: 180 tablet, Rfl: 1   triamcinolone cream (KENALOG) 0.1 %, APPLY TOPICALLY A THIN LAYER TO THE AFFECTED AREA(S) TWICE A DAY, Disp: 454 g, Rfl: 0   Allergies  Allergen Reactions   Contrast Media [Iodinated Contrast Media] Anaphylaxis   Latex Rash     Review of Systems  Constitutional: Negative.   Respiratory: Negative.  Negative for shortness of breath.   Cardiovascular: Negative.   Negative for chest pain and palpitations.  Gastrointestinal: Negative.   Musculoskeletal:  Positive for arthralgias (left shoulder pain, Dr. Ninfa Linden gave him an injection. He is concerned about a dark area to his left shoulder.) and back pain (He also had a steroid injection to his back on Monday.).  Neurological: Negative.  Negative for headaches.    Today's Vitals   12/07/21 0843  BP: 128/70  Pulse: 83  Temp: 98.1 F (36.7 C)  TempSrc: Oral  Weight: 201 lb (91.2 kg)  Height: 5' 7"  (1.702 m)   Body mass index is 31.48 kg/m.  Wt Readings from Last 3 Encounters:  12/07/21 201 lb (91.2 kg)  12/07/21 201 lb (91.2 kg)  06/28/21 216 lb 12.8 oz (98.3 kg)    Objective:  Physical Exam Vitals reviewed.  Constitutional:      General: He is not in acute distress.    Appearance: Normal appearance. He is obese.  Cardiovascular:     Rate and Rhythm: Normal rate and regular rhythm.     Pulses: Normal pulses.     Heart sounds: Normal heart sounds. No murmur heard. Pulmonary:     Effort: Pulmonary effort is normal. No respiratory distress.     Breath sounds: Normal breath sounds. No wheezing.  Abdominal:     Tenderness: There is no right CVA tenderness or left CVA tenderness.  Musculoskeletal:        General: Tenderness (left shoulder) present. No swelling.     Comments: Left shoulder ROM is limited to less than 90 degrees lateral flexion  Skin:    General: Skin is warm and dry.     Capillary Refill: Capillary refill takes less than 2 seconds.     Comments: Darkened pigmentation to left shoulder as imprint of patch.  Neurological:     General: No focal deficit present.     Mental Status: He is alert and oriented to person, place, and time.     Cranial Nerves: No cranial nerve deficit.     Motor: No weakness.  Psychiatric:        Mood and Affect: Mood normal.        Behavior: Behavior normal.        Thought Content: Thought content normal.        Judgment: Judgment normal.         Assessment And Plan:     1. Essential hypertension Comments: Blood pressure is well controlled. Continue current medications.  - BMP8+EGFR  2. Pure hypercholesterolemia Comments: Cholesterol has been within normal limits. Continue statin, tolerating well - Lipid panel  3. Chronic left shoulder pain Comments: He is to have PT but has not started. Stiffness is present. Area on shoulder darkened as imprint of patch. Steroid injection done by Ortho 2 weeks ago. Encouraged  to go ahead and start PT to help with his strength  4. Class 1 obesity due to excess calories with serious comorbidity and body mass index (BMI) of 31.0 to 31.9 in adult He is encouraged to strive for BMI less than 30 to decrease cardiac risk. Advised to aim for at least 150 minutes of exercise per week as tolerated.    Patient was given opportunity to ask questions. Patient verbalized understanding of the plan and was able to repeat key elements of the plan. All questions were answered to their satisfaction.  Ryan Brine, FNP   I, Ryan Brine, FNP, have reviewed all documentation for this visit. The documentation on 12/07/21 for the exam, diagnosis, procedures, and orders are all accurate and complete.   IF YOU HAVE BEEN REFERRED TO A SPECIALIST, IT MAY TAKE 1-2 WEEKS TO SCHEDULE/PROCESS THE REFERRAL. IF YOU HAVE NOT HEARD FROM US/SPECIALIST IN TWO WEEKS, PLEASE GIVE [place] A CALL AT [phone].   THE PATIENT IS ENCOURAGED TO PRACTICE SOCIAL DISTANCING DUE TO THE COVID-19 PANDEMIC.

## 2021-12-07 NOTE — Progress Notes (Signed)
This visit occurred during the SARS-CoV-2 public health emergency.  Safety protocols were in place, including screening questions prior to the visit, additional usage of staff PPE, and extensive cleaning of exam room while observing appropriate contact time as indicated for disinfecting solutions.  Subjective:   Ryan Craig is a 76 y.o. male who presents for Medicare Annual/Subsequent preventive examination.  Review of Systems     Cardiac Risk Factors include: advanced age (>51men, >19 women);dyslipidemia;hypertension;male gender;obesity (BMI >30kg/m2)     Objective:    Today's Vitals   12/07/21 0819 12/07/21 0826  BP: 128/70   Pulse: 83   Temp: 98.1 F (36.7 C)   TempSrc: Oral   SpO2: 99%   Weight: 201 lb (91.2 kg)   Height: 5\' 7"  (1.702 m)   PainSc:  6    Body mass index is 31.48 kg/m.  Advanced Directives 12/07/2021 03/28/2021 11/25/2020 10/29/2019 01/18/2015 12/23/2014 12/11/2014  Does Patient Have a Medical Advance Directive? Yes Yes Yes Yes Yes Yes Yes  Type of Advance Directive Living will [place];Living will [place];Living will Living will [place];Living will Living will Living will  Does patient want to make changes to medical advance directive? - No - Patient declined - - - No - Patient declined -  Copy of [place] in Chart? - No - copy requested No - copy requested - - No - copy requested No - copy requested    Current Medications (verified) Outpatient Encounter Medications as of 12/07/2021  Medication Sig   acetaminophen (TYLENOL) 500 MG tablet Take 500 mg by mouth every 6 (six) hours as needed.   amLODipine (NORVASC) 10 MG tablet TAKE 1 TABLET BY MOUTH     DAILY   aspirin EC 81 MG tablet Take 81 mg by mouth daily.   baclofen (LIORESAL) 10 MG tablet Take 1 tablet (10 mg total) by mouth 3 (three) times daily as needed for muscle spasms.   BLACK CURRANT SEED OIL PO Take by mouth.    Cholecalciferol (VITAMIN D-3 PO) Take 2 tablets by mouth daily.    diazepam (VALIUM) 5 MG tablet TAKE ONE TAB ONE HOUR PRIOR TO INJECTION REPEAT AS NEEDED #2 . ZERO REFILLS   lisinopril (ZESTRIL) 10 MG tablet Take 1 tablet (10 mg total) by mouth daily.   metoprolol succinate (TOPROL-XL) 25 MG 24 hr tablet Take 1 tablet (25 mg total) by mouth daily.   rosuvastatin (CRESTOR) 40 MG tablet TAKE 1 TABLET BY MOUTH EVERY DAY (Patient taking differently: Takes every other day)   triamcinolone cream (KENALOG) 0.1 % APPLY TOPICALLY A THIN LAYER TO THE AFFECTED AREA(S) TWICE A DAY   diclofenac Sodium (VOLTAREN) 1 % GEL Apply 2 g topically 4 (four) times daily. (Patient not taking: Reported on 12/07/2021)   HYDROcodone-acetaminophen (NORCO/VICODIN) 5-325 MG tablet Take 1 tablet by mouth 3 (three) times daily as needed for moderate pain. (Patient not taking: Reported on 12/07/2021)   methylPREDNISolone (MEDROL) 4 MG tablet Medrol dose pack. Take as instructed (Patient not taking: Reported on 12/07/2021)   No facility-administered encounter medications on file as of 12/07/2021.    Allergies (verified) Contrast media [iodinated contrast media] and Latex   History: Past Medical History:  Diagnosis Date   Anemia    no on any meds   Arthritis    Chronic back pain    deteriorating   Hyperlipidemia    was on Lipitor but was taken off 3-65yrs ago by medical  md bc running liver enzymes up   Hypertension    takes Amlodipine and Quinapril daily   Joint pain    Joint swelling    Past Surgical History:  Procedure Laterality Date   arthroscopic knee surgery Right    COLONOSCOPY     INGUINAL HERNIA REPAIR Bilateral    x 4   JOINT REPLACEMENT Right 6 yrs ago   knee   KNEE ARTHROSCOPY Left 02/24/2014   Procedure: LEFT KNEE ARTHROSCOPY WITH PARTIAL MEDIAL MENISCECTOMY;  Surgeon: Mcarthur Rossetti, MD;  Location: [facility];  Service: Orthopedics;  Laterality: Left;   LEFT HEART CATH AND CORONARY ANGIOGRAPHY N/A  07/22/2020   Procedure: LEFT HEART CATH AND CORONARY ANGIOGRAPHY;  Surgeon: Burnell Blanks, MD;  Location: [facility];  Service: Cardiovascular;  Laterality: N/A;   TOTAL KNEE ARTHROPLASTY Left 12/22/2014   Procedure: LEFT TOTAL KNEE ARTHROPLASTY;  Surgeon: Mcarthur Rossetti, MD;  Location: [facility];  Service: Orthopedics;  Laterality: Left;   Family History  Problem Relation Age of Onset   Heart attack Mother    Cancer Maternal Aunt    Cancer Paternal Aunt    Cancer Cousin    Social History   Socioeconomic History   Marital status: Married    Spouse name: Not on file   Number of children: Not on file   Years of education: Not on file   Highest education level: Not on file  Occupational History   Occupation: retired  Tobacco Use   Smoking status: Former    Passive exposure: Past   Smokeless tobacco: Former   Tobacco comments:    quit smoking about 6-7ytrs ago  Scientific laboratory technician Use: Never used  Substance and Sexual Activity   Alcohol use: No    Comment: drank over 40 years ago   Drug use: No   Sexual activity: Not Currently  Other Topics Concern   Not on file  Social History Narrative   Not on file   Social Determinants of Health   Financial Resource Strain: Low Risk    Difficulty of Paying Living Expenses: Not hard at all  Food Insecurity: No Food Insecurity   Worried About Charity fundraiser in the Last Year: Never true   [place] in the Last Year: Never true  Transportation Needs: No Transportation Needs   Lack of Transportation (Medical): No   Lack of Transportation (Non-Medical): No  Physical Activity: Insufficiently Active   Days of Exercise per Week: 3 days   Minutes of Exercise per Session: 20 min  Stress: No Stress Concern Present   Feeling of Stress : Not at all  Social Connections: Not on file    Tobacco Counseling Counseling given: Not Answered Tobacco comments: quit smoking about 6-7ytrs ago   Clinical  Intake:  Pre-visit preparation completed: Yes  Pain : 0-10 Pain Score: 6  Pain Type: Acute pain Pain Location: Shoulder Pain Orientation: Left Pain Descriptors / Indicators: Nagging Pain Onset: More than a month ago Pain Frequency: Constant     Nutritional Status: BMI > 30  Obese Nutritional Risks: None Diabetes: No  How often do you need to have someone help you when you read instructions, pamphlets, or other written materials from your doctor or pharmacy?: 1 - Never What is the last grade level you completed in school?: 11th grade  Diabetic? no  Interpreter Needed?: No  Information entered by :: NAllen LPN   Activities of Daily Living In  your present state of health, do you have any difficulty performing the following activities: 12/07/2021  Hearing? N  Vision? N  Difficulty concentrating or making decisions? N  Walking or climbing stairs? N  Dressing or bathing? N  Doing errands, shopping? N  Preparing Food and eating ? N  Using the Toilet? N  In the past six months, have you accidently leaked urine? N  Do you have problems with loss of bowel control? N  Managing your Medications? N  Managing your Finances? N  Housekeeping or managing your Housekeeping? N  Some recent data might be hidden    Patient Care Team: [name], FNP as PCP - General (General Practice) Jerline Pain, MD as PCP - Cardiology (Cardiology) Rex Kras, Claudette Stapler, RN as [place] any recent Medical Services you may have received from other than [facility] providers in the past year (date may be approximate).     Assessment:   This is a routine wellness examination for Ryan Craig.  Hearing/Vision screen Vision Screening - Comments:: No regular eye exams,   Dietary issues and exercise activities discussed: Current Exercise Habits: Home exercise routine, Type of exercise: Other - see comments (stationary bike), Time (Minutes): 20   Goals Addressed              This Visit's Progress    Patient Stated       12/07/2021, no goals       Depression Screen PHQ 2/9 Scores 12/07/2021 11/25/2020 10/29/2019 01/22/2019 10/22/2018 09/27/2018  PHQ - 2 Score 0 0 0 0 0 0  PHQ- 9 Score - - 0 - - -    Fall Risk Fall Risk  12/07/2021 11/25/2020 10/29/2019 01/22/2019 10/22/2018  Falls in the past year? 0 1 1 0 1  Comment - fell getting off the bike, fell in the garden tripped over grass - -  Number falls in past yr: - 1 - - 0  Comment - - - - -  Injury with Fall? - 0 1 - 0  Comment - - broke wrist - -  Risk for fall due to : Medication side effect;Impaired mobility Medication side effect History of fall(s);Medication side effect - -  Follow up Falls evaluation completed;Education provided;Falls prevention discussed Falls evaluation completed;Education provided;Falls prevention discussed Falls evaluation completed;Education provided;Falls prevention discussed - -    FALL RISK PREVENTION PERTAINING TO THE HOME:  Any stairs in or around the home? No  If so, are there any without handrails?  N/a Home free of loose throw rugs in walkways, pet beds, electrical cords, etc? Yes  Adequate lighting in your home to reduce risk of falls? Yes   ASSISTIVE DEVICES UTILIZED TO PREVENT FALLS:  Life alert? No  Use of a cane, walker or w/c? Yes  Grab bars in the bathroom? No  Shower chair or bench in shower? Yes  Elevated toilet seat or a handicapped toilet? No   TIMED UP AND GO:  Was the test performed? No .    Gait slow and steady with assistive device  Cognitive Function: MMSE - Mini Mental State Exam 10/22/2018  Orientation to time 5  Orientation to Place 5  Registration 3  Attention/ Calculation 5  Language- name 2 objects 2  Language- read & follow direction 1  Write a sentence 1  Copy design 1     6CIT Screen 12/07/2021 11/25/2020 10/29/2019  What Year? 0 points 0 points 0 points  What month? 0 points 0  points 0 points  What time? 0 points 0 points 0  points  Count back from 20 0 points 0 points 0 points  Months in reverse 2 points 4 points 2 points  Repeat phrase 8 points 4 points 0 points  Total Score 10 8 2     Immunizations Immunization History  Administered Date(s) Administered   Fluad Quad(high Dose 65+) 09/14/2020, 06/28/2021   Influenza, High Dose Seasonal PF 09/27/2018, 08/05/2019   PFIZER(Purple Top)SARS-COV-2 Vaccination 12/17/2019, 01/14/2020, 08/30/2020    TDAP status: Up to date  Flu Vaccine status: Up to date  Pneumococcal vaccine status: Declined,  Education has been provided regarding the importance of this vaccine but patient still declined. Advised may receive this vaccine at local pharmacy or Health Dept. Aware to provide a copy of the vaccination record if obtained from local pharmacy or Health Dept. Verbalized acceptance and understanding.   Covid-19 vaccine status: Completed vaccines  Qualifies for Shingles Vaccine? Yes   Zostavax completed No   Shingrix Completed?: No.    Education has been provided regarding the importance of this vaccine. Patient has been advised to call insurance company to determine out of pocket expense if they have not yet received this vaccine. Advised may also receive vaccine at local pharmacy or Health Dept. Verbalized acceptance and understanding.  Screening Tests Health Maintenance  Topic Date Due   COVID-19 Vaccine (4 - Booster for Pfizer series) 10/25/2020   Zoster Vaccines- Shingrix (1 of 2) 03/06/2022 (Originally 12/09/1964)   Pneumonia Vaccine 38+ Years old (1 - PCV) 12/07/2022 (Originally 12/09/1951)   TETANUS/TDAP  04/25/2023   INFLUENZA VACCINE  Completed   Hepatitis C Screening  Completed   HPV VACCINES  Aged Out   COLONOSCOPY (Pts 45-70yrs Insurance coverage will need to be confirmed)  Discontinued    Health Maintenance  Health Maintenance Due  Topic Date Due   COVID-19 Vaccine (4 - Booster for [place] series) 10/25/2020    Colorectal cancer screening: No  longer required.   Lung Cancer Screening: (Low Dose CT Chest recommended if Age 88-80 years, 30 pack-year currently smoking OR have quit w/in 15years.) does not qualify.   Lung Cancer Screening Referral: no  Additional Screening:  Hepatitis C Screening: does qualify; Completed 09/04/2017  Vision Screening: Recommended annual ophthalmology exams for early detection of glaucoma and other disorders of the eye. Is the patient up to date with their annual eye exam?  No  Who is the provider or what is the name of the office in which the patient attends annual eye exams? Can't remember name If pt is not established with a provider, would they like to be referred to a provider to establish care? No .   Dental Screening: Recommended annual dental exams for proper oral hygiene  Community Resource Referral / Chronic Care Management: CRR required this visit?  No   CCM required this visit?  No      Plan:     I have personally reviewed and noted the following in the patients chart:   Medical and social history Use of alcohol, tobacco or illicit drugs  Current medications and supplements including opioid prescriptions. Patient is not currently taking opioid prescriptions. Functional ability and status Nutritional status Physical activity Advanced directives List of other physicians Hospitalizations, surgeries, and ER visits in previous 12 months Vitals Screenings to include cognitive, depression, and falls Referrals and appointments  In addition, I have reviewed and discussed with patient certain preventive protocols, quality metrics, and best practice recommendations. A  written personalized care plan for preventive services as well as general preventive health recommendations were provided to patient.     Kellie Simmering, LPN   [phone]   Nurse Notes: none

## 2021-12-07 NOTE — Patient Instructions (Signed)
Ryan Craig , Thank you for taking time to come for your Medicare Wellness Visit. I appreciate your ongoing commitment to your health goals. Please review the following plan we discussed and let me know if I can assist you in the future.   Screening recommendations/referrals: Colonoscopy: not required Recommended yearly ophthalmology/optometry visit for glaucoma screening and checkup Recommended yearly dental visit for hygiene and checkup  Vaccinations: Influenza vaccine: completed 06/28/2021, due next flu season Pneumococcal vaccine: decline Tdap vaccine: completed 04/24/2013, due 04/25/2023 Shingles vaccine: decline   Covid-19:  02/07/2021, 08/30/2020, 01/14/2020, 12/17/2019  Advanced directives: Please bring a copy of your POA (Power of Attorney) and/or Living Will to your next appointment.   Conditions/risks identified: none  Next appointment: Follow up in one year for your annual wellness visit.   Preventive Care 51 Years and Older, Male Preventive care refers to lifestyle choices and visits with your health care provider that can promote health and wellness. What does preventive care include? A yearly physical exam. This is also called an annual well check. Dental exams once or twice a year. Routine eye exams. Ask your health care provider how often you should have your eyes checked. Personal lifestyle choices, including: Daily care of your teeth and gums. Regular physical activity. Eating a healthy diet. Avoiding tobacco and drug use. Limiting alcohol use. Practicing safe sex. Taking low doses of aspirin every day. Taking vitamin and mineral supplements as recommended by your health care provider. What happens during an annual well check? The services and screenings done by your health care provider during your annual well check will depend on your age, overall health, lifestyle risk factors, and family history of disease. Counseling  Your health care provider may ask you questions  about your: Alcohol use. Tobacco use. Drug use. Emotional well-being. Home and relationship well-being. Sexual activity. Eating habits. History of falls. Memory and ability to understand (cognition). Work and work Statistician. Screening  You may have the following tests or measurements: Height, weight, and BMI. Blood pressure. Lipid and cholesterol levels. These may be checked every 5 years, or more frequently if you are over 43 years old. Skin check. Lung cancer screening. You may have this screening every year starting at age 14 if you have a 30-pack-year history of smoking and currently smoke or have quit within the past 15 years. Fecal occult blood test (FOBT) of the stool. You may have this test every year starting at age 52. Flexible sigmoidoscopy or colonoscopy. You may have a sigmoidoscopy every 5 years or a colonoscopy every 10 years starting at age 29. Prostate cancer screening. Recommendations will vary depending on your family history and other risks. Hepatitis C blood test. Hepatitis B blood test. Sexually transmitted disease (STD) testing. Diabetes screening. This is done by checking your blood sugar (glucose) after you have not eaten for a while (fasting). You may have this done every 1-3 years. Abdominal aortic aneurysm (AAA) screening. You may need this if you are a current or former smoker. Osteoporosis. You may be screened starting at age 39 if you are at high risk. Talk with your health care provider about your test results, treatment options, and if necessary, the need for more tests. Vaccines  Your health care provider may recommend certain vaccines, such as: Influenza vaccine. This is recommended every year. Tetanus, diphtheria, and acellular pertussis (Tdap, Td) vaccine. You may need a Td booster every 10 years. Zoster vaccine. You may need this after age 40. Pneumococcal 13-valent conjugate (PCV13) vaccine.  One dose is recommended after age 58. Pneumococcal  polysaccharide (PPSV23) vaccine. One dose is recommended after age 34. Talk to your health care provider about which screenings and vaccines you need and how often you need them. This information is not intended to replace advice given to you by your health care provider. Make sure you discuss any questions you have with your health care provider. Document Released: 11/05/2015 Document Revised: 06/28/2016 Document Reviewed: 08/10/2015 Elsevier Interactive Patient Education  2017 [place] Prevention in the Home Falls can cause injuries. They can happen to people of all ages. There are many things you can do to make your home safe and to help prevent falls. What can I do on the outside of my home? Regularly fix the edges of walkways and driveways and fix any cracks. Remove anything that might make you trip as you walk through a door, such as a raised step or threshold. Trim any bushes or trees on the path to your home. Use bright outdoor lighting. Clear any walking paths of anything that might make someone trip, such as rocks or tools. Regularly check to see if handrails are loose or broken. Make sure that both sides of any steps have handrails. Any raised decks and porches should have guardrails on the edges. Have any leaves, snow, or ice cleared regularly. Use sand or salt on walking paths during winter. Clean up any spills in your garage right away. This includes oil or grease spills. What can I do in the bathroom? Use night lights. Install grab bars by the toilet and in the tub and shower. Do not use towel bars as grab bars. Use non-skid mats or decals in the tub or shower. If you need to sit down in the shower, use a plastic, non-slip stool. Keep the floor dry. Clean up any water that spills on the floor as soon as it happens. Remove soap buildup in the tub or shower regularly. Attach bath mats securely with double-sided non-slip rug tape. Do not have throw rugs and other  things on the floor that can make you trip. What can I do in the bedroom? Use night lights. Make sure that you have a light by your bed that is easy to reach. Do not use any sheets or blankets that are too big for your bed. They should not hang down onto the floor. Have a firm chair that has side arms. You can use this for support while you get dressed. Do not have throw rugs and other things on the floor that can make you trip. What can I do in the kitchen? Clean up any spills right away. Avoid walking on wet floors. Keep items that you use a lot in easy-to-reach places. If you need to reach something above you, use a strong step stool that has a grab bar. Keep electrical cords out of the way. Do not use floor polish or wax that makes floors slippery. If you must use wax, use non-skid floor wax. Do not have throw rugs and other things on the floor that can make you trip. What can I do with my stairs? Do not leave any items on the stairs. Make sure that there are handrails on both sides of the stairs and use them. Fix handrails that are broken or loose. Make sure that handrails are as long as the stairways. Check any carpeting to make sure that it is firmly attached to the stairs. Fix any carpet that is loose or  worn. Avoid having throw rugs at the top or bottom of the stairs. If you do have throw rugs, attach them to the floor with carpet tape. Make sure that you have a light switch at the top of the stairs and the bottom of the stairs. If you do not have them, ask someone to add them for you. What else can I do to help prevent falls? Wear shoes that: Do not have high heels. Have rubber bottoms. Are comfortable and fit you well. Are closed at the toe. Do not wear sandals. If you use a stepladder: Make sure that it is fully opened. Do not climb a closed stepladder. Make sure that both sides of the stepladder are locked into place. Ask someone to hold it for you, if possible. Clearly  mark and make sure that you can see: Any grab bars or handrails. First and last steps. Where the edge of each step is. Use tools that help you move around (mobility aids) if they are needed. These include: Canes. Walkers. Scooters. Crutches. Turn on the lights when you go into a dark area. Replace any light bulbs as soon as they burn out. Set up your furniture so you have a clear path. Avoid moving your furniture around. If any of your floors are uneven, fix them. If there are any pets around you, be aware of where they are. Review your medicines with your doctor. Some medicines can make you feel dizzy. This can increase your chance of falling. Ask your doctor what other things that you can do to help prevent falls. This information is not intended to replace advice given to you by your health care provider. Make sure you discuss any questions you have with your health care provider. Document Released: 08/05/2009 Document Revised: 03/16/2016 Document Reviewed: 11/13/2014 Elsevier Interactive Patient Education  2017 [employer].

## 2021-12-07 NOTE — Patient Instructions (Signed)

## 2021-12-14 DIAGNOSIS — Z23 Encounter for immunization: Secondary | ICD-10-CM | POA: Diagnosis not present

## 2021-12-22 ENCOUNTER — Ambulatory Visit: Payer: Medicare Other | Admitting: Rehabilitative and Restorative Service Providers"

## 2022-01-02 ENCOUNTER — Encounter: Payer: Self-pay | Admitting: Rehabilitative and Restorative Service Providers"

## 2022-01-02 ENCOUNTER — Other Ambulatory Visit: Payer: Self-pay

## 2022-01-02 ENCOUNTER — Ambulatory Visit (INDEPENDENT_AMBULATORY_CARE_PROVIDER_SITE_OTHER): Payer: Medicare Other | Admitting: Rehabilitative and Restorative Service Providers"

## 2022-01-02 DIAGNOSIS — R293 Abnormal posture: Secondary | ICD-10-CM

## 2022-01-02 DIAGNOSIS — G8929 Other chronic pain: Secondary | ICD-10-CM

## 2022-01-02 DIAGNOSIS — M25512 Pain in left shoulder: Secondary | ICD-10-CM | POA: Diagnosis not present

## 2022-01-02 DIAGNOSIS — M6281 Muscle weakness (generalized): Secondary | ICD-10-CM | POA: Diagnosis not present

## 2022-01-02 NOTE — Therapy (Signed)
OUTPATIENT PHYSICAL THERAPY SHOULDER EVALUATION   Patient Name: Ryan Craig MRN: [id number] DOB:03/30/1946, 76 y.o., male Today's Date: 01/02/2022   PT End of Session - 01/02/22 1220     Visit Number 1    Number of Visits 20    Date for PT Re-Evaluation 03/13/22    Authorization Type Medicare, [employer]    Progress Note Due on Visit 10    PT Start Time 3016    PT Stop Time 1222    PT Time Calculation (min) 37 min    Activity Tolerance Patient tolerated treatment well    Behavior During Therapy WFL for tasks assessed/performed             Past Medical History:  Diagnosis Date   Anemia    no on any meds   Arthritis    Chronic back pain    deteriorating   Hyperlipidemia    was on Lipitor but was taken off 3-74yr ago by medical md bc running liver enzymes up   Hypertension    takes Amlodipine and Quinapril daily   Joint pain    Joint swelling    Past Surgical History:  Procedure Laterality Date   arthroscopic knee surgery Right    COLONOSCOPY     INGUINAL HERNIA REPAIR Bilateral    x 4   JOINT REPLACEMENT Right 6 yrs ago   knee   KNEE ARTHROSCOPY Left 02/24/2014   Procedure: LEFT KNEE ARTHROSCOPY WITH PARTIAL MEDIAL MENISCECTOMY;  Surgeon: Ryan Rossetti MD;  [facility]  Service: Orthopedics;  Laterality: Left;   LEFT HEART CATH AND CORONARY ANGIOGRAPHY N/A 07/22/2020   Procedure: LEFT HEART CATH AND CORONARY ANGIOGRAPHY;  Surgeon: Ryan Blanks MD;  [facility];  Service: Cardiovascular;  Laterality: N/A;   TOTAL KNEE ARTHROPLASTY Left 12/22/2014   Procedure: LEFT TOTAL KNEE ARTHROPLASTY;  Surgeon: Ryan Rossetti MD;  [facility]  Service: Orthopedics;  Laterality: Left;   Patient Active Problem List   Diagnosis Date Noted   HLD (hyperlipidemia) 07/23/2020   Chest pain 07/22/2020   Chest pain of uncertain etiology    Essential hypertension 01/22/2019   Persistent cough 01/22/2019   Right bundle branch block  11/21/2018   Left anterior fascicular block 11/21/2018   Former smoker 11/21/2018   Obesity (BMI 30-39.9) 11/21/2018   Need for vaccination 09/27/2018   Osteoarthritis of left knee 12/22/2014   Status post total left knee replacement 12/22/2014   Acute medial meniscus tear of left knee 02/24/2014    PCP: Ryan Craig  REFERRING PROVIDER: CPete Pelt Craig  REFERRING DIAG: [id number](ICD-10-CM) - Acute pain of left shoulder  THERAPY DIAG:  Chronic left shoulder pain  Muscle weakness (generalized)  Abnormal posture   ONSET DATE:  10/23/2021  SUBJECTIVE:  SUBJECTIVE STATEMENT: Pt indicated insidious onset of symptoms in Lt shoulder c difficulty lifting and moving arm.  Had injection that helped some.  Has trouble sleeping in Lt side.   PERTINENT HISTORY: Lt shoulder subacromial injection  PAIN:  Are you having pain? Yes: NPRS scale: 7 at worst/10 Pain location: Lt shoulder  Pain description: sharp pain, achy Aggravating factors: getting jacket on, reaching/lifting Relieving factors: injection, medicine helps  PRECAUTIONS: None  WEIGHT BEARING RESTRICTIONS No  FALLS:  Has patient fallen in last 6 months? No Number of falls: 0   LIVING ENVIRONMENT: Lives with: lives with their family   OCCUPATION: Retired  PLOF: Independent, Rt hand dominant, garden/light yard work   PATIENT GOALS Reduce pain  OBJECTIVE:   PATIENT SURVEYS:  01/02/2022:FOTO intake:  50 predicted:  66  COGNITION: 01/02/2022:Overall cognitive status: Within functional limits for tasks assessed     SENSATION: 01/02/2022: Ryan Craig touch: WFL  POSTURE: 01/02/2022: Rounder shoulders, increased thoracic kyphosis , FHP  UPPER EXTREMITY ROM:   ROM Right 01/02/2022 Left 01/02/2022 AROM measured in supine   Shoulder flexion WFL 128 (PROM: 145) c pain  Shoulder extension    Shoulder abduction WFL 110 (PROM 130) c pain  Shoulder adduction    Shoulder internal rotation WFL 70 in supine 45 deg abduction  Shoulder external rotation WFL 60 in supine 45 deg abduction   Elbow flexion    Elbow extension    Wrist flexion    Wrist extension    Wrist ulnar deviation    Wrist radial deviation    Wrist pronation    Wrist supination    (Blank rows = not tested)  UPPER EXTREMITY MMT:  MMT Right 01/02/2022 Left 01/02/2022  Shoulder flexion 5/5  Supine 90 deg flexion 27 lbs 2/5  Supine 90 deg flexion 17, 17.8 lbs c pain  Shoulder extension    Shoulder abduction 5/5 2/5  Shoulder adduction    Shoulder internal rotation 5/5 5/5  Shoulder external rotation 5/5 4/5  Middle trapezius    Lower trapezius    Elbow flexion    Elbow extension    Wrist flexion    Wrist extension    Wrist ulnar deviation    Wrist radial deviation    Wrist pronation    Wrist supination    Grip strength (lbs)    (Blank rows = not tested)  SHOULDER SPECIAL TESTS:  01/02/2022:   Rotator cuff assessment: Lt : Drop arm test: positive  and + Shrug and + painful arc     JOINT MOBILITY TESTING:  01/02/2022: normal mobility in mid range Lt glenohumeral joint  PALPATION:  01/02/2022: Tenderness anterior Lt shoulder near joint line   TODAY'S TREATMENT:  01/02/2022: Therex: HEP instruction/performance c cues for techniques, handout provided.  Trial set performed of each for comprehension and symptom assessment.  See below for exercise list. Manual: G2-g3 inferior joint mobs Lt glenohumral joint   PATIENT EDUCATION: 01/02/2022: Education details: HEP, POC Person educated: Patient, family Education method: Explanation Education comprehension: verbalized understanding, returned demonstration, verbal cues required, and needs further education   HOME EXERCISE PROGRAM: 01/02/2022: Access Code: [email or web address] URL:  [email or web address] Date: 01/02/2022 Prepared by: Ryan Craig  Exercises Seated Scapular Retraction - 2 x daily - 7 x weekly - 2 sets - 10 reps - 5 hold Isometric Shoulder Flexion at Wall - 2 x daily - 7 x weekly - 1 sets - 10-15 reps - 5 hold Standing Isometric Shoulder Internal Rotation at Doorway (Mirrored) - 2  x daily - 7 x weekly - 1 sets - 10-15 reps - 5 hold Standing Isometric Shoulder External Rotation with Doorway - 2 x daily - 7 x weekly - 1 sets - 10-15 reps - 5 hold Isometric Shoulder Abduction at Wall - 2 x daily - 7 x weekly - 1 sets - 10-15 reps - 5 hold   ASSESSMENT:  CLINICAL IMPRESSION: Patient is a 76 y.o. who comes to clinic with complaints of Lt shoulder pain with mobility, strength and movement coordination deficits that impair their ability to perform usual daily and recreational functional activities without increase difficulty/symptoms at this time.  Patient to benefit from skilled PT services to address impairments and limitations to improve to previous level of function without restriction secondary to condition.    OBJECTIVE IMPAIRMENTS decreased activity tolerance, decreased coordination, decreased endurance, decreased mobility, decreased ROM, decreased strength, hypomobility, impaired perceived functional ability, impaired UE functional use, improper body mechanics, postural dysfunction, and pain.   ACTIVITY LIMITATIONS cleaning, community activity, meal prep, laundry, and yard work.   PERSONAL FACTORS  Hyperlipidemia, HTN, chronic back pain  are also affecting patient's functional outcome.    REHAB POTENTIAL: Good  CLINICAL DECISION MAKING: Stable/uncomplicated  EVALUATION COMPLEXITY: Low   GOALS: Goals reviewed with patient? Yes  Eval Date 01/02/2022 Short term PT Goals (target date for Short term goals are 3 weeks 01/23/2022) Patient will demonstrate independent use of home exercise program to maintain progress from in clinic  treatments. Goal status: New   Long term PT goals (target dates for all long term goals are 10 weeks  03/13/2022 ) Patient will demonstrate/report pain at worst less than or equal to 2/10 to facilitate minimal limitation in daily activity secondary to pain symptoms. Goal status: New  Patient will demonstrate independent use of home exercise program to facilitate ability to maintain/progress functional gains from skilled physical therapy services. Goal status: New  Patient will demonstrate FOTO outcome > or = 66 % to indicate reduced disability due to condition. Goal status: New  Patient will demonstrate Lt [facility] joint mobility WFL to facilitate usual self care, dressing, reaching overhead at PLOF s limitation due to symptoms.  Goal status: New      5.  Patient will demonstrate Lt UE MMT 4/5 or greater throughout to facilitate usual lifting, carrying in functional activity to PLOF s limitation.  Goal status: New      6.   Patient will demonstrate ability to sleep s symptoms.   Goal status: New      PLAN: PT FREQUENCY: 1-2x/week  PT DURATION: 10 weeks  PLANNED INTERVENTIONS: Therapeutic exercises, Therapeutic activity, Neuro Muscular re-education, Balance training, Gait training, Patient/Family education, Joint mobilization, Stair training, DME instructions, Dry Needling, Electrical stimulation, Cryotherapy, Moist heat, Taping, Ultrasound, Ionotophoresis '4mg'$ /ml Dexamethasone, and Manual therapy.  All included unless contraindicated  PLAN FOR NEXT SESSION: Review knowledge of existing HEP.  AAROM in gravity reduced positioning.    Ryan Craig, PT, DPT, OCS, ATC 01/02/22  12:33 PM

## 2022-01-12 ENCOUNTER — Other Ambulatory Visit: Payer: Self-pay | Admitting: Orthopaedic Surgery

## 2022-01-12 ENCOUNTER — Telehealth: Payer: Self-pay | Admitting: Orthopaedic Surgery

## 2022-01-12 MED ORDER — HYDROCODONE-ACETAMINOPHEN 5-325 MG PO TABS: 1.0000 | ORAL_TABLET | Freq: Three times a day (TID) | ORAL | 0 refills | Status: DC | PRN

## 2022-01-12 NOTE — Telephone Encounter (Signed)
Please advise 

## 2022-01-12 NOTE — Telephone Encounter (Signed)
Pt would like his oxycod called in  ?

## 2022-01-12 NOTE — Telephone Encounter (Signed)
Lvm informing 

## 2022-01-13 ENCOUNTER — Encounter: Payer: Self-pay | Admitting: Rehabilitative and Restorative Service Providers"

## 2022-01-13 ENCOUNTER — Ambulatory Visit (INDEPENDENT_AMBULATORY_CARE_PROVIDER_SITE_OTHER): Payer: Medicare Other | Admitting: Rehabilitative and Restorative Service Providers"

## 2022-01-13 ENCOUNTER — Other Ambulatory Visit: Payer: Self-pay

## 2022-01-13 DIAGNOSIS — R293 Abnormal posture: Secondary | ICD-10-CM | POA: Diagnosis not present

## 2022-01-13 DIAGNOSIS — M6281 Muscle weakness (generalized): Secondary | ICD-10-CM

## 2022-01-13 DIAGNOSIS — M25512 Pain in left shoulder: Secondary | ICD-10-CM

## 2022-01-13 DIAGNOSIS — G8929 Other chronic pain: Secondary | ICD-10-CM

## 2022-01-13 NOTE — Therapy (Signed)
?OUTPATIENT PHYSICAL THERAPY TREATMENT NOTE ? ? ?Patient Name: Ryan Craig ?MRN: [id number] ?DOB:[birth year], 76 y.o., male ?Today's Date: 01/13/2022 ? ?PCP: [name], FNP ?REFERRING PROVIDER: Pete Pelt, PA-C ? ? PT End of Session - 01/13/22 1121   ? ? Visit Number 2   ? Number of Visits 20   ? Date for PT Re-Evaluation 03/13/22   ? Authorization Type Medicare, Aet[employer]   ? Progress Note Due on Visit 10   ? PT Start Time 1055   ? PT Stop Time 1135   ? PT Time Calculation (min) 40 min   ? Activity Tolerance Patient limited by pain   ? Behavior During Therapy [facility] for tasks assessed/performed   ? ?  ?  ? ?  ? ? ?Past Medical History:  ?Diagnosis Date  ? Anemia   ? no on any meds  ? Arthritis   ? Chronic back pain   ? deteriorating  ? Hyperlipidemia   ? was on Lipitor but was taken off 3-43yr ago by medical md bc running liver enzymes up  ? Hypertension   ? takes Amlodipine and Quinapril daily  ? Joint pain   ? Joint swelling   ? ?Past Surgical History:  ?Procedure Laterality Date  ? arthroscopic knee surgery Right   ? COLONOSCOPY    ? INGUINAL HERNIA REPAIR Bilateral   ? x 4  ? JOINT REPLACEMENT Right 6 yrs ago  ? knee  ? KNEE ARTHROSCOPY Left 02/24/2014  ? Procedure: LEFT KNEE ARTHROSCOPY WITH PARTIAL MEDIAL MENISCECTOMY;  Surgeon: CMcarthur Rossetti MD;  [place]  Service: Orthopedics;  Laterality: Left;  ? LEFT HEART CATH AND CORONARY ANGIOGRAPHY N/A 07/22/2020  ? Procedure: LEFT HEART CATH AND CORONARY ANGIOGRAPHY;  Surgeon: MBurnell Blanks MD;  [facility];  Service: Cardiovascular;  Laterality: N/A;  ? TOTAL KNEE ARTHROPLASTY Left 12/22/2014  ? Procedure: LEFT TOTAL KNEE ARTHROPLASTY;  Surgeon: CMcarthur Rossetti MD;  [place]  Service: Orthopedics;  Laterality: Left;  ? ?Patient Active Problem List  ? Diagnosis Date Noted  ? HLD (hyperlipidemia) 07/23/2020  ? Chest pain 07/22/2020  ? Chest pain of uncertain etiology   ? Essential hypertension 01/22/2019  ?  Persistent cough 01/22/2019  ? Right bundle branch block 11/21/2018  ? Left anterior fascicular block 11/21/2018  ? Former smoker 11/21/2018  ? Obesity (BMI 30-39.9) 11/21/2018  ? Need for vaccination 09/27/2018  ? Osteoarthritis of left knee 12/22/2014  ? Status post total left knee replacement 12/22/2014  ? Acute medial meniscus tear of left knee 02/24/2014  ? ? ?REFERRING PROVIDER: CPete Pelt PA-C ?  ?REFERRING DIAG: M25.512 (ICD-10-CM) - Acute pain of left shoulder ? ?ONSET DATE:  10/23/2021 ? ?THERAPY DIAG:  ?Chronic left shoulder pain ? ?Muscle weakness (generalized) ? ?Abnormal posture ? ?PERTINENT HISTORY: Lt shoulder subacromial injection recent ? ?PRECAUTIONS: None ? ?SUBJECTIVE: Pt indicated doing fairly well.  Pt indicated he was workout in garden yesterday and was able to sleep well last night.  Has had some nights that were tough to sleep.   ? ?PAIN:  ?Are you having pain? No pain at rest :  ?NPRS scale: 0/10 upon arrival, pain at worst 7/10 at worst in last few days ?Pain location: Lt shoulder  ?Pain description: sharp pain, achy ?Aggravating factors: getting jacket on, reaching/lifting, nighttime ?Relieving factors: pain medicine ? ?OBJECTIVE:  ?  ?PATIENT SURVEYS:  ?01/02/2022:FOTO intake:  50 predicted:  66 ?  ?COGNITION: ?01/02/2022:Overall  cognitive status: Within functional limits for tasks assessed ?                                    ?SENSATION: ?01/02/2022: Light touch: WFL ?  ?POSTURE: ?01/02/2022: Rounder shoulders, increased thoracic kyphosis , FHP ?  ?UPPER EXTREMITY ROM:  ?  ?ROM Right ?01/02/2022 Left ?01/02/2022 ?AROM measured in supine Left ?01/13/2022  ?Shoulder flexion WFL 128 (PROM: 145) c pain 130 c pain end range, painful arc noted  ?Shoulder extension       ?Shoulder abduction WFL 110 (PROM 130) c pain   ?Shoulder adduction       ?Shoulder internal rotation WFL 70 in supine 45 deg abduction   ?Shoulder external rotation WFL 60 in supine 45 deg abduction    ?Elbow flexion        ?Elbow extension       ?Wrist flexion       ?Wrist extension       ?Wrist ulnar deviation       ?Wrist radial deviation       ?Wrist pronation       ?Wrist supination       ?(Blank rows = not tested) ?  ?UPPER EXTREMITY MMT: ?  ?MMT Right ?01/02/2022 Left ?01/02/2022  ?Shoulder flexion 5/5 ?  ?Supine 90 deg flexion 27 lbs 2/5 ?  ?Supine 90 deg flexion 17, 17.8 lbs c pain  ?Shoulder extension      ?Shoulder abduction 5/5 2/5  ?Shoulder adduction      ?Shoulder internal rotation 5/5 5/5  ?Shoulder external rotation 5/5 4/5  ?Middle trapezius      ?Lower trapezius      ?Elbow flexion      ?Elbow extension      ?Wrist flexion      ?Wrist extension      ?Wrist ulnar deviation      ?Wrist radial deviation      ?Wrist pronation      ?Wrist supination      ?Grip strength (lbs)      ?(Blank rows = not tested) ?  ?SHOULDER SPECIAL TESTS: ?            01/02/2022:  ?            Rotator cuff assessment: Lt : Drop arm test: positive  and + Shrug and + painful arc  ?             ?  ?JOINT MOBILITY TESTING:  ?01/02/2022: normal mobility in mid range Lt glenohumeral joint ?  ?PALPATION:  ?01/02/2022: Tenderness anterior Lt shoulder near joint line ?             ?TODAY'S TREATMENT:  ?01/13/2022: ?Therex:           Review of all HEP from last visit ( additional time spent in review of cues) ?  Standing arm at side isometric painfree 5 sec on/off x 12 each direction (shown in sitting as well) ?  Attempted AAROM flexion in supine c wand (too much pain noted) ?  Seated green band rows x 20 ? ?Manual:           G2-g3 inferior joint mobs Lt glenohumral joint ? ? ?01/02/2022: ?Therex:            HEP instruction/performance c cues for techniques, handout provided.  Trial set performed of each for comprehension and symptom assessment.  See below for exercise list. ?Manual:           G2-g3 inferior joint mobs Lt glenohumral joint ?  ?  ?PATIENT EDUCATION: ?01/02/2022: ?Education details: HEP, POC ?Person educated: Patient, family ?Education method:  Explanation ?Education comprehension: verbalized understanding, returned demonstration, verbal cues required, and needs further education ?  ?  ?HOME EXERCISE PROGRAM: ?01/02/2022: ?Access Code: [email or web address] ?URL: [email or web address] ?Date: 01/02/2022 ?Prepared by: Scot Jun ?  ?Exercises ?Seated Scapular Retraction - 2 x daily - 7 x weekly - 2 sets - 10 reps - 5 hold ?Isometric Shoulder Flexion at Wall - 2 x daily - 7 x weekly - 1 sets - 10-15 reps - 5 hold ?Standing Isometric Shoulder Internal Rotation at Doorway (Mirrored) - 2 x daily - 7 x weekly - 1 sets - 10-15 reps - 5 hold ?Standing Isometric Shoulder External Rotation with Doorway - 2 x daily - 7 x weekly - 1 sets - 10-15 reps - 5 hold ?Isometric Shoulder Abduction at Wall - 2 x daily - 7 x weekly - 1 sets - 10-15 reps - 5 hold ?  ?  ?ASSESSMENT: ?  ?CLINICAL IMPRESSION: ?Pt needed to improve in HEP use (self admitted ), particularly c isometric hold activity. Continued complaints in primarily active mobility similar to evaluation noted.  Continued skilled PT services indicated to help improve contractile tissue ability for arm use.  ?  ?  ?OBJECTIVE IMPAIRMENTS decreased activity tolerance, decreased coordination, decreased endurance, decreased mobility, decreased ROM, decreased strength, hypomobility, impaired perceived functional ability, impaired UE functional use, improper body mechanics, postural dysfunction, and pain.  ?  ?ACTIVITY LIMITATIONS cleaning, community activity, meal prep, laundry, and yard work.  ?  ?PERSONAL FACTORS  Hyperlipidemia, HTN, chronic back pain  are also affecting patient's functional outcome.  ?  ?  ?REHAB POTENTIAL: Good ?  ?CLINICAL DECISION MAKING: Stable/uncomplicated ?  ?EVALUATION COMPLEXITY: Low ?  ?  ?GOALS: ?Goals reviewed with patient? Yes ?  ?Eval Date 01/02/2022 ?Short term PT Goals (target date for Short term goals are 3 weeks 01/23/2022) ?Patient will demonstrate independent use of home exercise  program to maintain progress from in clinic treatments. ?Goal status: on going - assessed 01/13/2022 ?  ?Long term PT goals (target dates for all long term goals are 10 weeks  03/13/2022 ) ?Patient will demonstr

## 2022-01-18 ENCOUNTER — Other Ambulatory Visit: Payer: Self-pay

## 2022-01-18 ENCOUNTER — Encounter: Payer: Self-pay | Admitting: Physical Therapy

## 2022-01-18 ENCOUNTER — Ambulatory Visit (INDEPENDENT_AMBULATORY_CARE_PROVIDER_SITE_OTHER): Payer: Medicare Other | Admitting: Physical Therapy

## 2022-01-18 DIAGNOSIS — R293 Abnormal posture: Secondary | ICD-10-CM

## 2022-01-18 DIAGNOSIS — M25512 Pain in left shoulder: Secondary | ICD-10-CM

## 2022-01-18 DIAGNOSIS — M6281 Muscle weakness (generalized): Secondary | ICD-10-CM

## 2022-01-18 DIAGNOSIS — G8929 Other chronic pain: Secondary | ICD-10-CM

## 2022-01-18 NOTE — Therapy (Signed)
?OUTPATIENT PHYSICAL THERAPY TREATMENT NOTE ? ? ?Patient Name: Ryan Craig ?MRN: [id number] ?DOB:January 24, 1946, 76 y.o., male ?Today's Date: 01/18/2022 ? ?PCP: [name], FNP ?REFERRING PROVIDER: Pete Pelt, PA-C ? ? PT End of Session - 01/18/22 0849   ? ? Visit Number 3   ? Number of Visits 20   ? Date for PT Re-Evaluation 03/13/22   ? Authorization Type Medicare, Aetna   ? Progress Note Due on Visit 10   ? PT Start Time [id number]   ? PT Stop Time [phone]   ? PT Time Calculation (min) 39 min   ? Activity Tolerance Patient limited by pain   ? Behavior During Therapy [facility] for tasks assessed/performed   ? ?  ?  ? ?  ? ? ? ?Past Medical History:  ?Diagnosis Date  ? Anemia   ? no on any meds  ? Arthritis   ? Chronic back pain   ? deteriorating  ? Hyperlipidemia   ? was on Lipitor but was taken off 3-56yr ago by medical md bc running liver enzymes up  ? Hypertension   ? takes Amlodipine and Quinapril daily  ? Joint pain   ? Joint swelling   ? ?Past Surgical History:  ?Procedure Laterality Date  ? arthroscopic knee surgery Right   ? COLONOSCOPY    ? INGUINAL HERNIA REPAIR Bilateral   ? x 4  ? JOINT REPLACEMENT Right 6 yrs ago  ? knee  ? KNEE ARTHROSCOPY Left 02/24/2014  ? Procedure: LEFT KNEE ARTHROSCOPY WITH PARTIAL MEDIAL MENISCECTOMY;  Surgeon: CMcarthur Rossetti MD;  [place] [facility]  Service: Orthopedics;  Laterality: Left;  ? LEFT HEART CATH AND CORONARY ANGIOGRAPHY N/A 07/22/2020  ? Procedure: LEFT HEART CATH AND CORONARY ANGIOGRAPHY;  Surgeon: MBurnell Blanks MD;  [facility] [facility];  Service: Cardiovascular;  Laterality: N/A;  ? TOTAL KNEE ARTHROPLASTY Left 12/22/2014  ? Procedure: LEFT TOTAL KNEE ARTHROPLASTY;  Surgeon: CMcarthur Rossetti MD;  [place] [facility]  Service: Orthopedics;  Laterality: Left;  ? ?Patient Active Problem List  ? Diagnosis Date Noted  ? HLD (hyperlipidemia) 07/23/2020  ? Chest pain 07/22/2020  ? Chest pain of uncertain etiology   ? Essential hypertension 01/22/2019  ?  Persistent cough 01/22/2019  ? Right bundle branch block 11/21/2018  ? Left anterior fascicular block 11/21/2018  ? Former smoker 11/21/2018  ? Obesity (BMI 30-39.9) 11/21/2018  ? Need for vaccination 09/27/2018  ? Osteoarthritis of left knee 12/22/2014  ? Status post total left knee replacement 12/22/2014  ? Acute medial meniscus tear of left knee 02/24/2014  ? ? ?REFERRING PROVIDER: CPete Pelt PA-C ?  ?REFERRING DIAG: M25.512 (ICD-10-CM) - Acute pain of left shoulder ? ?ONSET DATE:  10/23/2021 ? ?THERAPY DIAG:  ?Chronic left shoulder pain ? ?Muscle weakness (generalized) ? ?Abnormal posture ? ?PERTINENT HISTORY: Lt shoulder subacromial injection recent ? ?PRECAUTIONS: None ? ?SUBJECTIVE: Pt arriving today reporting 4-5/10 pain in his Left shoulder. Pt reporting more anterior shoulder pain. Pt reporting sleep is still disturbed.  ? ?PAIN:  ?Are you having pain? No pain at rest :  ?NPRS scale: 0/10 upon arrival, pain at worst 7/10 at worst in last few days ?Pain location: Lt shoulder  ?Pain description: sharp pain, achy ?Aggravating factors: getting jacket on, reaching/lifting, nighttime ?Relieving factors: pain medicine ? ?OBJECTIVE:  ?  ?PATIENT SURVEYS:  ?01/02/2022:FOTO intake:  50 predicted:  66 ?  ?COGNITION: ?01/02/2022:Overall cognitive status: Within functional limits for tasks assessed ?                                    ?  SENSATION: ?01/02/2022: Light touch: WFL ?  ?POSTURE: ?01/02/2022: Rounder shoulders, increased thoracic kyphosis , FHP ?  ?UPPER EXTREMITY ROM:  ?  ?ROM Right ?01/02/2022 Left ?01/02/2022 ?AROM measured in supine Left ?01/13/2022 Left ?01/18/2022 ?Measured in supine  ?Shoulder flexion WFL 128 (PROM: 145) c pain 130 c pain end range, painful arc noted 140 PROM  c painful arc noted  ?Shoulder extension        ?Shoulder abduction WFL 110 (PROM 130) c pain  118 PROM with painful arc noted  ?Shoulder adduction        ?Shoulder internal rotation WFL 70 in supine 45 deg abduction  70  deg ?shoulder 45 deg abducted  ?Shoulder external rotation WFL 60 in supine 45 deg abduction   60 deg shoulder 45 deg abducted   ?Elbow flexion        ?Elbow extension        ?Wrist flexion        ?Wrist extension        ?Wrist ulnar deviation        ?Wrist radial deviation        ?Wrist pronation        ?Wrist supination        ?(Blank rows = not tested) ?  ?UPPER EXTREMITY MMT: ?  ?MMT Right ?01/02/2022 Left ?01/02/2022  ?Shoulder flexion 5/5 ?  ?Supine 90 deg flexion 27 lbs 2/5 ?  ?Supine 90 deg flexion 17, 17.8 lbs c pain  ?Shoulder extension      ?Shoulder abduction 5/5 2/5  ?Shoulder adduction      ?Shoulder internal rotation 5/5 5/5  ?Shoulder external rotation 5/5 4/5  ?Middle trapezius      ?Lower trapezius      ?Elbow flexion      ?Elbow extension      ?Wrist flexion      ?Wrist extension      ?Wrist ulnar deviation      ?Wrist radial deviation      ?Wrist pronation      ?Wrist supination      ?Grip strength (lbs)      ?(Blank rows = not tested) ?  ?SHOULDER SPECIAL TESTS: ?            01/02/2022:  ?            Rotator cuff assessment: Lt : Drop arm test: positive  and + Shrug and + painful arc  ?             ?  ?JOINT MOBILITY TESTING:  ?01/02/2022: normal mobility in mid range Lt glenohumeral joint ?  ?PALPATION:  ?01/02/2022: Tenderness anterior Lt shoulder near joint line ?             ?TODAY'S TREATMENT:  ?01/18/2022: ?Therex:          standing isometrics: (flexion/ extension/IR/ER) holding 5 seconds x10  ?  Seated IR stretch using towel up the back and holding 5 seconds x 5  ?  Standing Level 3 theraband rows x 20 ?  Supine AAROM shoulder flexion using 1 # bar x 10, pt still limited by pain but range  ?improved with reps ?AAROM using 1 # bar ER x 10 holding 5 seconds each ?Table slides: flexion, scaption, ER all x 10 holding 10 seconds each ? ? ?Manual:           PROM left shoulder (pt actively guarding) ?G2-g3 inferior joint mobs Lt glenohumral joint ?Attempted STM and percussion, pt with  limited  tolerance due to pain ? ? ? ? ?01/13/2022: ?Therex:           Review of all HEP from last visit ( additional time spent in review of cues) ?  Standing arm at side isometric painfree 5 sec on/off x 12 each direction (shown in sitting as well) ?  Attempted AAROM flexion in supine c wand (too much pain noted) ?  Seated green band rows x 20 ? ?Manual:           G2-g3 inferior joint mobs Lt glenohumral joint ? ? ?01/02/2022: ?Therex:            HEP instruction/performance c cues for techniques, handout provided.  Trial set performed of each for comprehension and symptom assessment.  See below for exercise list. ?Manual:           G2-g3 inferior joint mobs Lt glenohumral joint ?  ?  ?PATIENT EDUCATION: ?01/02/2022: ?Education details: HEP, POC ?Person educated: Patient, family ?Education method: Explanation ?Education comprehension: verbalized understanding, returned demonstration, verbal cues required, and needs further education ?  ?  ?HOME EXERCISE PROGRAM: ?01/02/2022: ?Access Code: [email or web address] ?URL: [email or web address] ?Date: 01/02/2022 ?Prepared by: Scot Jun ?  ?Exercises ?Seated Scapular Retraction - 2 x daily - 7 x weekly - 2 sets - 10 reps - 5 hold ?Isometric Shoulder Flexion at Wall - 2 x daily - 7 x weekly - 1 sets - 10-15 reps - 5 hold ?Standing Isometric Shoulder Internal Rotation at Doorway (Mirrored) - 2 x daily - 7 x weekly - 1 sets - 10-15 reps - 5 hold ?Standing Isometric Shoulder External Rotation with Doorway - 2 x daily - 7 x weekly - 1 sets - 10-15 reps - 5 hold ?Isometric Shoulder Abduction at Wall - 2 x daily - 7 x weekly - 1 sets - 10-15 reps - 5 hold ?  ?  ?ASSESSMENT: ?  ?CLINICAL IMPRESSION: ?Pt arriving to therapy reporting 5/10 pain in his left shoulder. We reviewed isometric exercises in all 4 planes. Pt also tolerated table slides well and pt was instructed to add them to his HEP. Pt still guarding active movements and reporting increased pain with flexion, abduction and ER.  Pt with limited tolerance to STM and percussion to left shoulder. Continue skilled PT to maximize pt's function.  ?  ?  ?OBJECTIVE IMPAIRMENTS decreased activity tolerance, decreased coordination, decreased endurance, d

## 2022-01-25 ENCOUNTER — Encounter: Payer: Medicare Other | Admitting: Physical Therapy

## 2022-02-03 ENCOUNTER — Ambulatory Visit (INDEPENDENT_AMBULATORY_CARE_PROVIDER_SITE_OTHER): Payer: Medicare Other | Admitting: Rehabilitative and Restorative Service Providers"

## 2022-02-03 ENCOUNTER — Other Ambulatory Visit: Payer: Self-pay

## 2022-02-03 ENCOUNTER — Encounter: Payer: Self-pay | Admitting: Rehabilitative and Restorative Service Providers"

## 2022-02-03 DIAGNOSIS — R293 Abnormal posture: Secondary | ICD-10-CM | POA: Diagnosis not present

## 2022-02-03 DIAGNOSIS — G8929 Other chronic pain: Secondary | ICD-10-CM | POA: Diagnosis not present

## 2022-02-03 DIAGNOSIS — M25512 Pain in left shoulder: Secondary | ICD-10-CM | POA: Diagnosis not present

## 2022-02-03 DIAGNOSIS — M6281 Muscle weakness (generalized): Secondary | ICD-10-CM

## 2022-02-03 NOTE — Therapy (Addendum)
?OUTPATIENT PHYSICAL THERAPY TREATMENT NOTE /DISCHARGE ? ? ?Patient Name: Ryan Craig ?MRN: [id number] ?DOB:January 31, 1946, 76 y.o., male ?Today's Date: 02/03/2022 ? ?PCP: [name], FNP ?REFERRING PROVIDER: Pete Pelt, PA-C ? ? PT End of Session - 02/03/22 5038   ? ? Visit Number 4   ? Number of Visits 20   ? Date for PT Re-Evaluation 03/13/22   ? Authorization Type Medicare, [employer]   ? Progress Note Due on Visit 10   ? PT Start Time [phone]   ? PT Stop Time 0925   ? PT Time Calculation (min) 39 min   ? Activity Tolerance Patient limited by pain   ? Behavior During Therapy [facility] for tasks assessed/performed   ? ?  ?  ? ?  ? ? ? ? ?Past Medical History:  ?Diagnosis Date  ? Anemia   ? no on any meds  ? Arthritis   ? Chronic back pain   ? deteriorating  ? Hyperlipidemia   ? was on Lipitor but was taken off 3-3yr ago by medical md bc running liver enzymes up  ? Hypertension   ? takes Amlodipine and Quinapril daily  ? Joint pain   ? Joint swelling   ? ?Past Surgical History:  ?Procedure Laterality Date  ? arthroscopic knee surgery Right   ? COLONOSCOPY    ? INGUINAL HERNIA REPAIR Bilateral   ? x 4  ? JOINT REPLACEMENT Right 6 yrs ago  ? knee  ? KNEE ARTHROSCOPY Left 02/24/2014  ? Procedure: LEFT KNEE ARTHROSCOPY WITH PARTIAL MEDIAL MENISCECTOMY;  Surgeon: CMcarthur Rossetti MD;  [place]  Service: Orthopedics;  Laterality: Left;  ? LEFT HEART CATH AND CORONARY ANGIOGRAPHY N/A 07/22/2020  ? Procedure: LEFT HEART CATH AND CORONARY ANGIOGRAPHY;  Surgeon: MBurnell Blanks MD;  [facility];  Service: Cardiovascular;  Laterality: N/A;  ? TOTAL KNEE ARTHROPLASTY Left 12/22/2014  ? Procedure: LEFT TOTAL KNEE ARTHROPLASTY;  Surgeon: CMcarthur Rossetti MD;  [place]  Service: Orthopedics;  Laterality: Left;  ? ?Patient Active Problem List  ? Diagnosis Date Noted  ? HLD (hyperlipidemia) 07/23/2020  ? Chest pain 07/22/2020  ? Chest pain of uncertain etiology   ? Essential hypertension  01/22/2019  ? Persistent cough 01/22/2019  ? Right bundle branch block 11/21/2018  ? Left anterior fascicular block 11/21/2018  ? Former smoker 11/21/2018  ? Obesity (BMI 30-39.9) 11/21/2018  ? Need for vaccination 09/27/2018  ? Osteoarthritis of left knee 12/22/2014  ? Status post total left knee replacement 12/22/2014  ? Acute medial meniscus tear of left knee 02/24/2014  ? ? ?REFERRING PROVIDER: CPete Pelt PA-C ?  ?REFERRING DIAG: M25.512 (ICD-10-CM) - Acute pain of left shoulder ? ?ONSET DATE:  10/23/2021 ? ?THERAPY DIAG:  ?Chronic left shoulder pain ? ?Muscle weakness (generalized) ? ?Abnormal posture ? ?PERTINENT HISTORY: Lt shoulder subacromial injection recent ? ?PRECAUTIONS: None ? ?SUBJECTIVE:  Pt indicated night time being the worst, 7/10 and can disrupt sleep.  Pt indicated he has been able to use arm better in yard work activity.  ? ?PAIN:  ?Are you having pain? No pain at rest :  ?NPRS scale: 0/10 upon arrival, pain at worst 7/10 at worst at night ?Pain location: Lt shoulder  ?Pain description: sharp pain, achy ?Aggravating factors: getting jacket on, reaching/lifting, nighttime ?Relieving factors: pain medicine ? ?OBJECTIVE:  ?  ?PATIENT SURVEYS:  ?01/02/2022:FOTO intake:  50 predicted:  66 ?  ?COGNITION: ?01/02/2022:Overall cognitive status: Within functional  limits for tasks assessed ?                                    ?SENSATION: ?01/02/2022: Light touch: WFL ?  ?POSTURE: ?01/02/2022: Rounder shoulders, increased thoracic kyphosis , FHP ?  ?UPPER EXTREMITY ROM:  ?  ?ROM Right ?01/02/2022 Left ?01/02/2022 ?AROM measured in supine Left ?01/13/2022 Left ?01/18/2022 ?Measured in supine Left ?02/03/2022  ?Shoulder flexion WFL 128 (PROM: 145) c pain 130 c pain end range, painful arc noted 140 PROM  c painful arc noted AROM in sitting 80 deg c shrug ?  ?Shoulder extension         ?Shoulder abduction WFL 110 (PROM 130) c pain  118 PROM with painful arc noted   ?Shoulder adduction         ?Shoulder internal  rotation WFL 70 in supine 45 deg abduction  70 deg ?shoulder 45 deg abducted   ?Shoulder external rotation WFL 60 in supine 45 deg abduction   60 deg shoulder 45 deg abducted    ?Elbow flexion         ?Elbow extension         ?Wrist flexion         ?Wrist extension         ?Wrist ulnar deviation         ?Wrist radial deviation         ?Wrist pronation         ?Wrist supination         ?(Blank rows = not tested) ?  ?UPPER EXTREMITY MMT: ?  ?MMT Right ?01/02/2022 Left ?01/02/2022 Left ?02/03/2022  ?Shoulder flexion 5/5 ?  ?Supine 90 deg flexion 27 lbs 2/5 ?  ?Supine 90 deg flexion 17, 17.8 lbs c pain 2+/5 ? ?Supine 90 deg flexion: ?17.7, 17 lbs c pain noted  ?Shoulder extension       ?Shoulder abduction 5/5 2/5   ?Shoulder adduction       ?Shoulder internal rotation 5/5 5/5   ?Shoulder external rotation 5/5 4/5   ?Middle trapezius       ?Lower trapezius       ?Elbow flexion       ?Elbow extension       ?Wrist flexion       ?Wrist extension       ?Wrist ulnar deviation       ?Wrist radial deviation       ?Wrist pronation       ?Wrist supination       ?Grip strength (lbs)       ?(Blank rows = not tested) ?  ?SHOULDER SPECIAL TESTS: ?            01/02/2022:  ?            Rotator cuff assessment: Lt : Drop arm test: positive  and + Shrug and + painful arc  ?             ?  ?JOINT MOBILITY TESTING:  ?01/02/2022: normal mobility in mid range Lt glenohumeral joint ?  ?PALPATION:  ?01/02/2022: Tenderness anterior Lt shoulder near joint line ?             ?TODAY'S TREATMENT:  ?02/03/2022: ?Therex:           Supine AROM flexion 2 x 10 Lt shoulder ?  Supine AAROM c cane for flexion 2 x 10 bilateral ?  Supine 90 deg flexion hold to fatigue - 3.5 mins ?  Seated tband rows green 30x  ?  Seated pulley 3 mins flexion, scaption each ?  UBE fwd/rev 3 mins each way lvl 3.0 ?    ? ? ? ?01/18/2022: ?Therex:          standing isometrics: (flexion/ extension/IR/ER) holding 5 seconds x10  ?  Seated IR stretch using towel up the back and holding 5  seconds x 5  ?  Standing Level 3 theraband rows x 20 ?  Supine AAROM shoulder flexion using 1 # bar x 10, pt still limited by pain but range  ?improved with reps ?AAROM using 1 # bar ER x 10 holding 5 seconds each ?Table slides: flexion, scaption, ER all x 10 holding 10 seconds each ? ? ?Manual:           PROM left shoulder (pt actively guarding) ?G2-g3 inferior joint mobs Lt glenohumral joint ?Attempted STM and percussion, pt with limited tolerance due to pain ? ? ? ? ?01/13/2022: ?Therex:           Review of all HEP from last visit ( additional time spent in review of cues) ?  Standing arm at side isometric painfree 5 sec on/off x 12 each direction (shown in sitting as well) ?  Attempted AAROM flexion in supine c wand (too much pain noted) ?  Seated green band rows x 20 ? ?Manual:           G2-g3 inferior joint mobs Lt glenohumral joint ? ? ?01/02/2022: ?Therex:            HEP instruction/performance c cues for techniques, handout provided.  Trial set performed of each for comprehension and symptom assessment.  See below for exercise list. ?Manual:           G2-g3 inferior joint mobs Lt glenohumral joint ?  ?  ?PATIENT EDUCATION: ?02/03/2022: ?Education details: HEP, POC ?Person educated: Patient, family ?Education method: Explanation ?Education comprehension: verbalized understanding, returned demonstration, verbal cues required, and needs further education ?  ?  ?HOME EXERCISE PROGRAM: ?02/03/2022: ?Access Code: [email or web address] ?URL: [email or web address] ?Date: 02/03/2022 ?Prepared by: Scot Jun ? ?Exercises ?- Seated Scapular Retraction  - 2 x daily - 7 x weekly - 2 sets - 10 reps - 5 hold ?- Isometric Shoulder Flexion at Wall  - 2 x daily - 7 x weekly - 1 sets - 10-15 reps - 5 hold ?- Standing Isometric Shoulder Internal Rotation at Doorway (Mirrored)  - 2 x daily - 7 x weekly - 1 sets - 10-15 reps - 5 hold ?- Standing Isometric Shoulder External Rotation with Doorway  - 2 x daily - 7 x weekly - 1  sets - 10-15 reps - 5 hold ?- Isometric Shoulder Abduction at Wall  - 2 x daily - 7 x weekly - 1 sets - 10-15 reps - 5 hold ?- Supine Shoulder Flexion Extension AAROM with Dowel  - 2 x daily - 7 x weekly - 3

## 2022-02-06 ENCOUNTER — Other Ambulatory Visit: Payer: Self-pay | Admitting: Cardiology

## 2022-02-06 ENCOUNTER — Other Ambulatory Visit: Payer: Self-pay | Admitting: Nurse Practitioner

## 2022-02-13 NOTE — Progress Notes (Signed)
? ? ?Office Visit  ?  ?Patient Name: Ryan Craig ?Date of Encounter: 02/14/2022 ? ?Primary Care Provider:  [name], FNP ?Primary Cardiologist:  Candee Furbish, MD ?Primary Electrophysiologist: None ?Chief Complaint  ?  ?Follow-up CAD, myocarditis, aortic aneurysm, notes palpitations ? ? Patient Profile: ?HTN ?HLD ?Nonobstructive CAD ?Anemia ?Dilated aorta ?Myocarditis ?Aortic insufficiency ?  ?Recent Studies: ?2D echo 11/2018: With normal EF mild aortic regurgitation and mildly elevated pulmonary pressure ?06/2020 LHC: With mild nonobstructive CAD and no evidence of dilation of ascending aorta ?Cardiac MRI 10/21:  multifocal myocarditis, mild aortic insufficiency, dilated aortic root 45 mm ?Cardiac MRI 11/2020: Showed no active myocarditis with dilated acending aortic root recommend follow-up echo in 1 year ? ?History of Present Illness  ?  ?Ryan Craig is a 76 y.o. male with PMH of HTN, HLD, bifascicular block, nonobstructive CAD, myocarditis, moderate AI by MRI 2022, dilated aorta. He was first seen by Dr. Marlou Porch in 10/2018 for abnormal EKG. During visit patient denied symptoms of syncope. Echo was completed 11/2018 with normal EF and mild aortic regurgitation with mildly elevated pulmonary pressure. He was seen in the emergency room 07/22/2020 for complaint of chest pain. EKG demonstrated subtle ST elevations and troponins peaked to 1100. Code STEMI was activated but later canceled. LHC was performed showed mild nonobstructive CAD with no evidence of ischemia. There was also noted dilation of ascending aorta at 45 mm. Cardiac MRI performed 07/2020 to rule out myocarditis. Study showed multifocal myocarditis with overall EF of 61%. He was directed to repeat MRI in 6 months. Cardiac MRI repeat completed on 11/2020 showing no myocardial edema and stable myocarditis with no definite active inflammation. The latter study also demonstrated moderately dilated ascending aorta, 45 mm at sinus of Valsalva and 40 mm at mid  ascending aorta. ? ?Since last being seen in clinic a year ago Ryan Craig reports that he has been doing well.  His blood pressure today was 128/72 and heart rate is 69.  He is tolerating his medications without any side effects or adverse reactions.  He does tell me that lately he has been feeling an extra skipped beat at least 3-4 times a week more noticeably when he is resting laying in bed.  He states that the beats are not associated with any chest pain or shortness of breath.  He does yard work for exercise and states that lately he becomes more tired when mowing his grass.  He is euvolemic on examination today and lungs are clear.  He chest pain, palpitations, dyspnea, PND, orthopnea, nausea, vomiting, dizziness, syncope, edema, weight gain, or early satiety. ? ? ?Past Medical History  ?  ?Past Medical History:  ?Diagnosis Date  ? Anemia   ? no on any meds  ? Arthritis   ? Chronic back pain   ? deteriorating  ? Hyperlipidemia   ? was on Lipitor but was taken off 3-46yr ago by medical md bc running liver enzymes up  ? Hypertension   ? takes Amlodipine and Quinapril daily  ? Joint pain   ? Joint swelling   ? ?Past Surgical History:  ?Procedure Laterality Date  ? arthroscopic knee surgery Right   ? COLONOSCOPY    ? INGUINAL HERNIA REPAIR Bilateral   ? x 4  ? JOINT REPLACEMENT Right 6 yrs ago  ? knee  ? KNEE ARTHROSCOPY Left 02/24/2014  ? Procedure: LEFT KNEE ARTHROSCOPY WITH PARTIAL MEDIAL MENISCECTOMY;  Surgeon: CMcarthur Rossetti MD;  [place]  Service:  Orthopedics;  Laterality: Left;  ? LEFT HEART CATH AND CORONARY ANGIOGRAPHY N/A 07/22/2020  ? Procedure: LEFT HEART CATH AND CORONARY ANGIOGRAPHY;  Surgeon: Burnell Blanks, MD;  Location: [place] [facility];  Service: Cardiovascular;  Laterality: N/A;  ? TOTAL KNEE ARTHROPLASTY Left 12/22/2014  ? Procedure: LEFT TOTAL KNEE ARTHROPLASTY;  Surgeon: Mcarthur Rossetti, MD;  Location: [place];  Service: Orthopedics;  Laterality: Left;   ? ? ?Allergies ? ?Allergies  ?Allergen Reactions  ? Contrast Media [Iodinated Contrast Media] Anaphylaxis  ? Latex Rash  ? ? ?Home Medications  ?  ?Current Outpatient Medications  ?Medication Sig Dispense Refill  ? acetaminophen (TYLENOL) 500 MG tablet Take 500 mg by mouth every 6 (six) hours as needed.    ? amLODipine (NORVASC) 10 MG tablet TAKE 1 TABLET BY MOUTH     DAILY 90 tablet 3  ? aspirin EC 81 MG tablet Take 81 mg by mouth daily.    ? baclofen (LIORESAL) 10 MG tablet Take 1 tablet (10 mg total) by mouth 3 (three) times daily as needed for muscle spasms. 30 each 0  ? BLACK CURRANT SEED OIL PO Take by mouth.    ? Cholecalciferol (VITAMIN D-3 PO) Take 2 tablets by mouth daily.     ? diazepam (VALIUM) 5 MG tablet TAKE ONE TAB ONE HOUR PRIOR TO INJECTION REPEAT AS NEEDED #2 . ZERO REFILLS 2 tablet 0  ? HYDROcodone-acetaminophen (NORCO/VICODIN) 5-325 MG tablet Take 1 tablet by mouth 3 (three) times daily as needed for moderate pain. 40 tablet 0  ? methylPREDNISolone (MEDROL) 4 MG tablet Medrol dose pack. Take as instructed 21 tablet 0  ? metoprolol succinate (TOPROL-XL) 25 MG 24 hr tablet Take 1 tablet (25 mg total) by mouth daily. 90 tablet 3  ? rosuvastatin (CRESTOR) 40 MG tablet Take 40 mg by mouth daily.    ? triamcinolone cream (KENALOG) 0.1 % APPLY TOPICALLY A THIN LAYER TO THE AFFECTED AREA(S) TWICE A DAY 454 g 0  ? diclofenac Sodium (VOLTAREN) 1 % GEL Apply 2 g topically 4 (four) times daily. (Patient not taking: Reported on 02/14/2022) 100 g 2  ? lisinopril (ZESTRIL) 10 MG tablet Take 1 tablet (10 mg total) by mouth daily. (Patient not taking: Reported on 02/14/2022) 90 tablet 1  ? ?No current facility-administered medications for this visit.  ?  ? ?Review of Systems  ?Please see the history of present illness.    ?(+) Palpitations ?(+) Left shoulder pain ? ?All other systems reviewed and are otherwise negative except as noted above. ? ?Physical Exam  ?  ?Wt Readings from Last 3 Encounters:  ?02/14/22 205  lb (93 kg)  ?12/07/21 201 lb (91.2 kg)  ?12/07/21 201 lb (91.2 kg)  ? ?VS: ?Vitals:  ? 02/14/22 1329  ?BP: 128/72  ?Pulse: 69  ?SpO2: 95%  ?,Body mass index is 32.11 kg/m?. ? ?Constitutional:   ?   Appearance: Healthy appearance. Not in distress.  ?Neck:  ?   Vascular: JVD normal.  ?Pulmonary:  ?   Effort: Pulmonary effort is normal.   ?  Breath sounds: No wheezing. No rales. Diminished in the bases ?Cardiovascular:  ?   Normal rate. Regular rhythm. Normal S1. Normal S2.   ?   Murmurs: There is no murmur.  ?Edema: ?   Peripheral edema absent.  ?Abdominal:  ?   Palpations: Abdomen is soft non tender. There is no hepatomegaly.  ?Skin: ?   General: Skin is warm and dry.  ?Neurological:  ?  General: No focal deficit present.  ?   Mental Status: Alert and oriented to person, place and time.  ?   Cranial Nerves: Cranial nerves are intact.  ?EKG/LABS/Other Studies Reviewed  ?  ?ECG personally reviewed by me today -sinus rhythm with PACs, right bundle branch block and left anterior fascicular block with rate of 69- no acute changes. ? ?Risk Assessment/Calculations:   ? ?Lab Results  ?Component Value Date  ? WBC 5.8 02/22/2021  ? HGB 13.1 02/22/2021  ? HCT 39.8 02/22/2021  ? MCV 87 02/22/2021  ? PLT 307 02/22/2021  ? ?Lab Results  ?Component Value Date  ? CREATININE 0.85 12/07/2021  ? BUN 19 12/07/2021  ? NA 139 12/07/2021  ? K 5.1 12/07/2021  ? CL 101 12/07/2021  ? CO2 22 12/07/2021  ? ?Lab Results  ?Component Value Date  ? ALT 13 06/28/2021  ? AST 17 06/28/2021  ? ALKPHOS 64 06/28/2021  ? BILITOT 0.6 06/28/2021  ? ?Lab Results  ?Component Value Date  ? CHOL 151 12/07/2021  ? HDL 69 12/07/2021  ? [place] 63 12/07/2021  ? TRIG 105 12/07/2021  ? CHOLHDL 2.2 12/07/2021  ?  ?Lab Results  ?Component Value Date  ? HGBA1C 5.7 (H) 11/25/2020  ? ? ?Assessment & Plan  ?  ?1.  Palpitations ?-Patient has noticed fluttering at rest that has occurred continuously for the last few months at least 3 times per week.  We will have him wear  a 7-day ZIO monitor to evaluate symptoms and rule out arrhythmia. ?-Basic labs today ?-We will adjust medications based on findings of ZIO monitor and status of aortic aneurysm. ? ?2.  Nonobstructive CAD with pr

## 2022-02-14 ENCOUNTER — Ambulatory Visit (INDEPENDENT_AMBULATORY_CARE_PROVIDER_SITE_OTHER): Payer: Medicare Other | Admitting: Nurse Practitioner

## 2022-02-14 ENCOUNTER — Encounter: Payer: Self-pay | Admitting: Nurse Practitioner

## 2022-02-14 ENCOUNTER — Telehealth: Payer: Self-pay | Admitting: *Deleted

## 2022-02-14 ENCOUNTER — Ambulatory Visit (INDEPENDENT_AMBULATORY_CARE_PROVIDER_SITE_OTHER): Payer: Medicare Other

## 2022-02-14 VITALS — BP 128/72 | HR 69 | Ht 67.0 in | Wt 205.0 lb

## 2022-02-14 DIAGNOSIS — I7122 Aneurysm of the aortic arch, without rupture: Secondary | ICD-10-CM | POA: Diagnosis not present

## 2022-02-14 DIAGNOSIS — I10 Essential (primary) hypertension: Secondary | ICD-10-CM | POA: Diagnosis not present

## 2022-02-14 DIAGNOSIS — R002 Palpitations: Secondary | ICD-10-CM

## 2022-02-14 DIAGNOSIS — I452 Bifascicular block: Secondary | ICD-10-CM | POA: Diagnosis not present

## 2022-02-14 DIAGNOSIS — I712 Thoracic aortic aneurysm, without rupture, unspecified: Secondary | ICD-10-CM

## 2022-02-14 DIAGNOSIS — E782 Mixed hyperlipidemia: Secondary | ICD-10-CM | POA: Diagnosis not present

## 2022-02-14 DIAGNOSIS — I2510 Atherosclerotic heart disease of native coronary artery without angina pectoris: Secondary | ICD-10-CM

## 2022-02-14 NOTE — Progress Notes (Unsigned)
Enrolled patient for a 7 day Zio XT monitor to be mailed to patients home  Dr Skains to read 

## 2022-02-14 NOTE — Telephone Encounter (Signed)
Call placed to pt regarding message below: ?Patient is scheduled for his Mra on 03/08/22 . Patient is allergic to contrast and while scheduling they told me he would have to be prescribed "PREMED" for the MRA and for this to be sent to his pharmacy,  ? ? ? ? ?Per Ambrose Pancoast, NP, ? ?They use gadolinium and want require premedication ? ?I have left the pt a message to call back.  ?

## 2022-02-14 NOTE — Patient Instructions (Addendum)
Medication Instructions:  ?Your physician recommends that you continue on your current medications as directed. Please refer to the Current Medication list given to you today.] ? ?*If you need a refill on your cardiac medications before your next appointment, please call your pharmacy* ? ? ?Lab Work: ?TODAY:  BMET, CBC, MAG, & TSH  ? ?If you have labs (blood work) drawn today and your tests are completely normal, you will receive your results only by: ?MyChart Message (if you have MyChart) OR ?A paper copy in the mail ?If you have any lab test that is abnormal or we need to change your treatment, we will call you to review the results. ? ? ?Testing/Procedures: ?Your physician recommends that you have a MRA of the Chest to follow-up on the Aneurysm.  ? ? ?Your physician has requested that you have an echocardiogram. Echocardiography is a painless test that uses sound waves to create images of your heart. It provides your doctor with information about the size and shape of your heart and how well your heart?s chambers and valves are working. This procedure takes approximately one hour. There are no restrictions for this procedure. ? ?ZIO XT- Long Term Monitor Instructions ? ?Your physician has requested you wear a ZIO patch monitor for 7 days.  ?This is a single patch monitor. [employer] supplies one patch monitor per enrollment. Additional ?stickers are not available. Please do not apply patch if you will be having a Nuclear Stress Test,  ?Echocardiogram, Cardiac CT, MRI, or Chest Xray during the period you would be wearing the  ?monitor. The patch cannot be worn during these tests. You cannot remove and re-apply the  ?ZIO XT patch monitor.  ?Your ZIO patch monitor will be mailed 3 day USPS to your address on file. It may take 3-5 days  ?to receive your monitor after you have been enrolled.  ?Once you have received your monitor, please review the enclosed instructions. Your monitor  ?has already been registered  assigning a specific monitor serial # to you. ? ?Billing and Patient Assistance Program Information ? ?We have supplied [employer] with any of your insurance information on file for billing purposes. ?[employer] offers a sliding scale Patient Assistance Program for patients that do not have  ?insurance, or whose insurance does not completely cover the cost of the ZIO monitor.  ?You must apply for the Patient Assistance Program to qualify for this discounted rate.  ?To apply, please call [employer] at [phone], select option 4, select option 2, ask to apply for  ?Patient Assistance Program. Theodore Demark will ask your household income, and how many people  ?are in your household. They will quote your out-of-pocket cost based on that information.  ?[employer] will also be able to set up a 52-month interest-free payment plan if needed. ? ?Applying the monitor ?  ?Shave hair from upper left chest.  ?Hold abrader disc by orange tab. Rub abrader in 40 strokes over the upper left chest as  ?indicated in your monitor instructions.  ?Clean area with 4 enclosed alcohol pads. Let dry.  ?Apply patch as indicated in monitor instructions. Patch will be placed under collarbone on left  ?side of chest with arrow pointing upward.  ?Rub patch adhesive wings for 2 minutes. Remove white label marked "1". Remove the white  ?label marked "2". Rub patch adhesive wings for 2 additional minutes.  ?While looking in a mirror, press and release button in center of patch. A small green light will  ?flash 3-4 times. This  will be your only indicator that the monitor has been turned on.  ?Do not shower for the first 24 hours. You may shower after the first 24 hours.  ?Press the button if you feel a symptom. You will hear a small click. Record Date, Time and  ?Symptom in the Patient Logbook.  ?When you are ready to remove the patch, follow instructions on the last 2 pages of Patient  ?Logbook. Stick patch monitor onto the last page of Patient Logbook.  ?Place  Patient Logbook in the blue and white box. Use locking tab on box and tape box closed  ?securely. The blue and white box has prepaid postage on it. Please place it in the mailbox as  ?soon as possible. Your physician should have your test results approximately 7 days after the  ?monitor has been mailed back to [facility].  ?Call [facility] at [phone] if you have questions regarding  ?your ZIO XT patch monitor. Call them immediately if you see an orange light blinking on your  ?monitor.  ?If your monitor falls off in less than 4 days, contact our Monitor department at [phone].  ?If your monitor becomes loose or falls off after 4 days call [employer] at [phone] for  ?suggestions on securing your monitor ? ? ? ?Follow-Up: ?At [facility], you and your health needs are our priority.  As part of our continuing mission to provide you with exceptional heart care, we have created designated Provider Care Teams.  These Care Teams include your primary Cardiologist (physician) and Advanced Practice Providers (APPs -  Physician Assistants and Nurse Practitioners) who all work together to provide you with the care you need, when you need it. ? ?We recommend signing up for the patient portal called "MyChart".  Sign up information is provided on this After Visit Summary.  MyChart is used to connect with patients for Virtual Visits (Telemedicine).  Patients are able to view lab/test results, encounter notes, upcoming appointments, etc.  Non-urgent messages can be sent to your provider as well.   ?To learn more about what you can do with MyChart, go to [email or web address].   ? ?Your next appointment:   ?2 month(s) ? ?The format for your next appointment:   ?In Person ? ?Provider:   ?Candee Furbish, MD  or Melina Copa, PA-C or Ambrose Pancoast, NP       ? ? ?Other Instructions ? ? ?Important Information About Sugar ? ? ? ? ? ? ? ?Information About Your Aneurysm ? ?One of your tests has shown an  aneurysm of your ascending  aorta. The word "aneurysm" refers to a bulge in an artery (blood vessel). Most people think of them in the context of an emergency, but yours was found incidentally. At this point there is nothing you need to do from a procedure standpoint, but there are some important things to keep in mind for day-to-day life. ? ?Mainstays of therapy for aneurysms include very good blood pressure control, healthy lifestyle, and avoiding tobacco products and street drugs. Research has raised concern that antibiotics in the fluoroquinolone class could be associated with increased risk of having an aneurysm develop or tear. This includes medicines that end in "floxacin," like Cipro or Levaquin. Make sure to discuss this information with other healthcare providers if you require antibiotics. ? ?Since aneurysms can run in families, you should discuss your diagnosis with first degree relatives as they may need to be screened for this. Regular mild-moderate physical exercise is important,  but avoid heavy lifting/weight lifting over 30lbs, chopping wood, shoveling snow or digging heavy earth with a shovel. It is best to avoid activities that cause grunting or straining (medically referred to as a "Valsalva maneuver"). This happens when a person bears down against a closed throat to increase the strength of arm or abdominal muscles. There's often a tendency to do this when lifting heavy weights, doing sit-ups, push-ups or chin-ups, etc., but it may be harmful. ? ?This is a finding I would expect to be monitored periodically by your cardiology team. Most unruptured thoracic aortic aneurysms cause no symptoms, so they are often found during exams for other conditions. Contact a health care provider if you develop any discomfort in your upper back, neck, abdomen, trouble swallowing, cough or hoarseness, or unexplained weight loss. Get help right away if you develop severe pain in your upper back or abdomen that may  move into your chest and arms, or any other concerning symptoms such as shortness of breath or fever. ? ?

## 2022-02-15 ENCOUNTER — Telehealth: Payer: Self-pay

## 2022-02-15 ENCOUNTER — Encounter: Payer: Medicare Other | Admitting: Rehabilitative and Restorative Service Providers"

## 2022-02-15 LAB — BASIC METABOLIC PANEL
BUN/Creatinine Ratio: 15 (ref 10–24)
BUN: 13 mg/dL (ref 8–27)
CO2: 20 mmol/L (ref 20–29)
Calcium: 9.6 mg/dL (ref 8.6–10.2)
Chloride: 105 mmol/L (ref 96–106)
Creatinine, Ser: 0.89 mg/dL (ref 0.76–1.27)
Glucose: 123 mg/dL — ABNORMAL HIGH (ref 70–99)
Potassium: 4.5 mmol/L (ref 3.5–5.2)
Sodium: 142 mmol/L (ref 134–144)
eGFR: 88 mL/min/{1.73_m2} (ref >59)

## 2022-02-15 LAB — CBC
Hematocrit: 37.6 % (ref 37.5–51.0)
Hemoglobin: 12.6 g/dL — ABNORMAL LOW (ref 13.0–17.7)
MCH: 29.9 pg (ref 26.6–33.0)
MCHC: 33.5 g/dL (ref 31.5–35.7)
MCV: 89 fL (ref 79–97)
Platelets: 307 10*3/uL (ref 150–450)
RBC: 4.21 x10E6/uL (ref 4.14–5.80)
RDW: 12 % (ref 11.6–15.4)
WBC: 6.3 10*3/uL (ref 3.4–10.8)

## 2022-02-15 LAB — TSH: TSH: 0.859 u[IU]/mL (ref 0.450–4.500)

## 2022-02-15 LAB — MAGNESIUM: Magnesium: 1.8 mg/dL (ref 1.6–2.3)

## 2022-02-15 NOTE — Telephone Encounter (Signed)
-----   Message from Marylu Lund., NP sent at 02/15/2022  9:02 AM EDT ----- ?Good morning, ? ?Please let Mr.Zechman know that his lab work was normal with the exception of hemoglobin that was slightly below normal and is not contributing to his palpitations.  Please let us know if you have any questions regarding your labs. ? ?Thank you, ? ?Ambrose Pancoast, NP ?

## 2022-02-15 NOTE — Telephone Encounter (Signed)
Pt returning call

## 2022-02-15 NOTE — Telephone Encounter (Signed)
Returned call to pt, he has been made aware that he will not require premed for MRA due to they use Gadolinium. ? ?Pt verbalized understanding.  ?

## 2022-02-15 NOTE — Telephone Encounter (Signed)
Spoke with pt and advised per Madaline Guthrie labs are normal with exception of slightly low hgb which is not contributing your palpitations.  Pt verbalizes understanding and thanked Therapist, sports for the phone call. ?

## 2022-02-16 DIAGNOSIS — I2510 Atherosclerotic heart disease of native coronary artery without angina pectoris: Secondary | ICD-10-CM | POA: Diagnosis not present

## 2022-02-16 DIAGNOSIS — E782 Mixed hyperlipidemia: Secondary | ICD-10-CM

## 2022-02-16 DIAGNOSIS — I452 Bifascicular block: Secondary | ICD-10-CM

## 2022-02-16 DIAGNOSIS — R002 Palpitations: Secondary | ICD-10-CM | POA: Diagnosis not present

## 2022-02-16 DIAGNOSIS — I10 Essential (primary) hypertension: Secondary | ICD-10-CM

## 2022-02-22 ENCOUNTER — Other Ambulatory Visit: Payer: Self-pay | Admitting: Cardiology

## 2022-03-01 DIAGNOSIS — I2510 Atherosclerotic heart disease of native coronary artery without angina pectoris: Secondary | ICD-10-CM | POA: Diagnosis not present

## 2022-03-01 DIAGNOSIS — E782 Mixed hyperlipidemia: Secondary | ICD-10-CM | POA: Diagnosis not present

## 2022-03-01 DIAGNOSIS — I10 Essential (primary) hypertension: Secondary | ICD-10-CM | POA: Diagnosis not present

## 2022-03-01 DIAGNOSIS — I452 Bifascicular block: Secondary | ICD-10-CM | POA: Diagnosis not present

## 2022-03-08 ENCOUNTER — Ambulatory Visit (HOSPITAL_COMMUNITY)
Admission: RE | Admit: 2022-03-08 | Discharge: 2022-03-08 | Disposition: A | Discharge status: Home / Self Care | Payer: Medicare Other | Source: Ambulatory Visit | Attending: Nurse Practitioner | Admitting: Nurse Practitioner

## 2022-03-08 ENCOUNTER — Ambulatory Visit (HOSPITAL_BASED_OUTPATIENT_CLINIC_OR_DEPARTMENT_OTHER): Payer: Medicare Other

## 2022-03-08 DIAGNOSIS — I452 Bifascicular block: Secondary | ICD-10-CM

## 2022-03-08 DIAGNOSIS — I712 Thoracic aortic aneurysm, without rupture, unspecified: Secondary | ICD-10-CM | POA: Diagnosis not present

## 2022-03-08 DIAGNOSIS — E782 Mixed hyperlipidemia: Secondary | ICD-10-CM | POA: Diagnosis not present

## 2022-03-08 DIAGNOSIS — I2510 Atherosclerotic heart disease of native coronary artery without angina pectoris: Secondary | ICD-10-CM | POA: Insufficient documentation

## 2022-03-08 DIAGNOSIS — I10 Essential (primary) hypertension: Secondary | ICD-10-CM | POA: Diagnosis not present

## 2022-03-08 LAB — ECHOCARDIOGRAM COMPLETE
Area-P 1/2: 2.76 cm2
P 1/2 time: 442 msec
S' Lateral: 3.2 cm

## 2022-03-08 IMAGING — MR MR MRA CHEST W/ OR W/O CM
12 of 13 series · 15 of 16 positions shown · IV contrast (Yes GAD)
Comparison: [DATE]

CLINICAL DATA: Aortic aneurysm, known or suspected

EXAM:
MRA CHEST WITH OR WITHOUT CONTRAST
TECHNIQUE: Angiographic images of the chest were obtained using MRA technique
without and with intravenous contrast.
CONTRAST:  8mL GADAVIST GADOBUTROL 1 MMOL/ML IV SOLN

[Series 2: bSSFP · axial · 8.0mm · 0.86mm/px · 1 of 27 slices shown (1 of 2)]
[im 1/27]
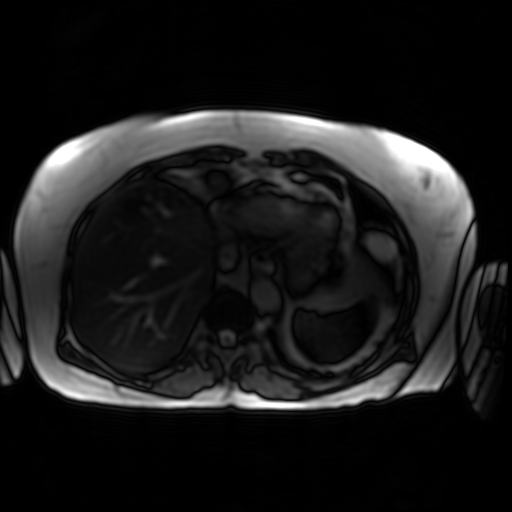

[Series 3: T1 · axial · 8.0mm · 0.82mm/px · 1 of 28 slices shown]
[im 1/28]
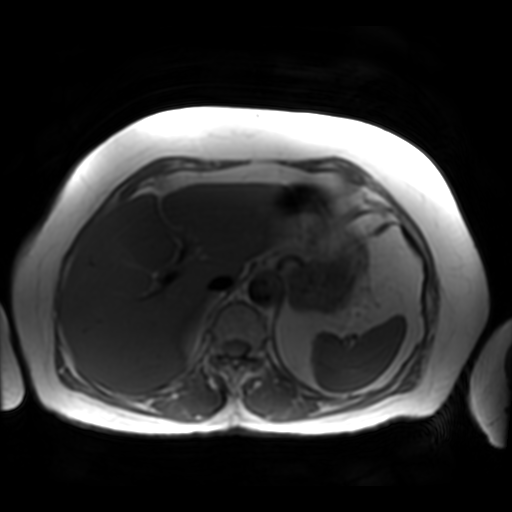

[Series 4: bSSFP · sagittal · 8.0mm · 0.82mm/px · 4 of 240 slices shown (2 of 2)]
[im 1/240]
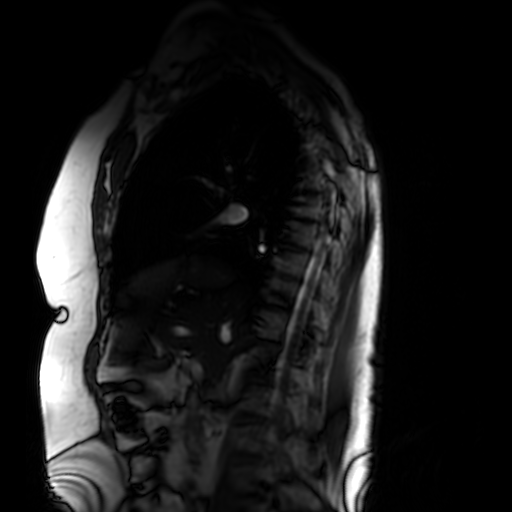
[im 80/240]
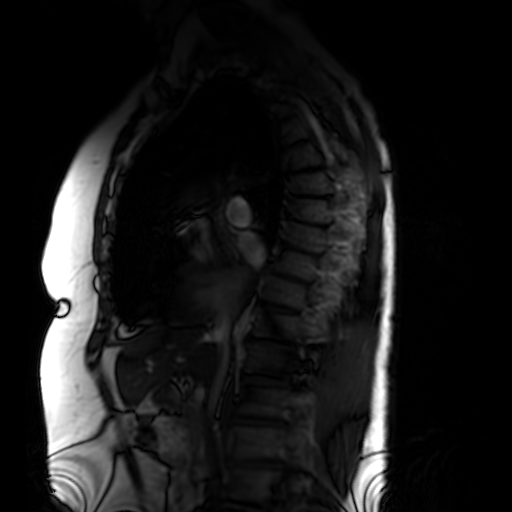
[im 160/240]
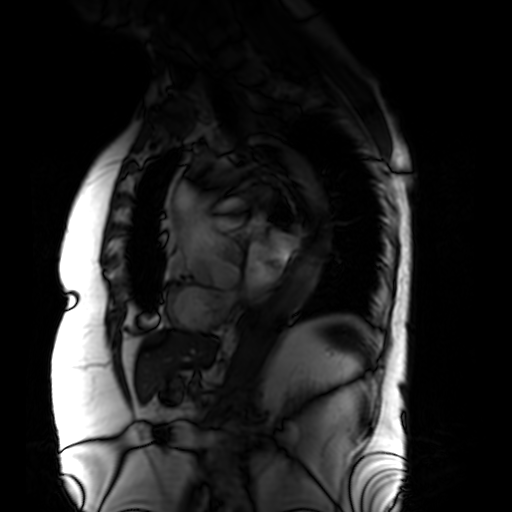
[im 240/240]
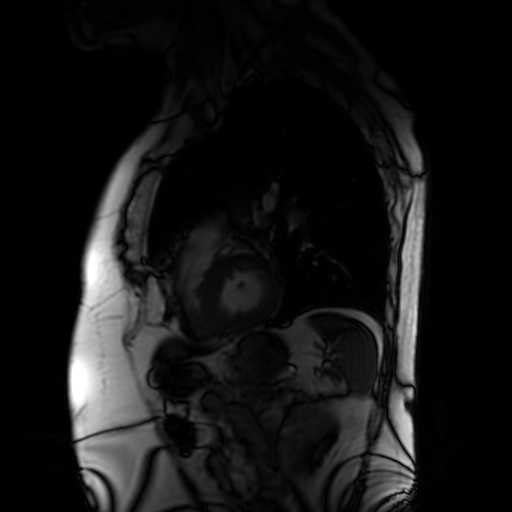

[Series 5: T1 dynamic · axial · 5.0mm · 0.86mm/px · 1 of 56 slices shown]
[im 1/56]
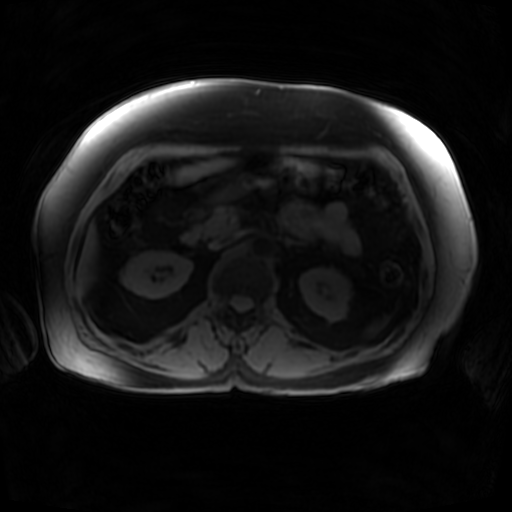

[Series 8: T1 dynamic post-contrast · axial · 5.0mm · 0.86mm/px · 1 of 56 slices shown (1 of 2)]
[im 1/56]
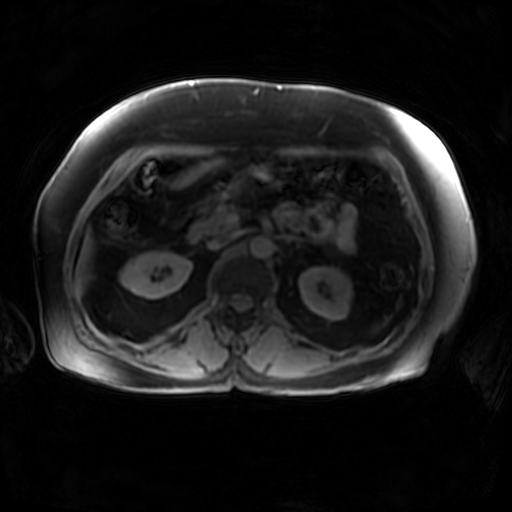

[Series 9: T1 dynamic post-contrast · coronal · 4.0mm · 0.78mm/px · 1 of 48 slices shown (2 of 2)]
[im 1/48]
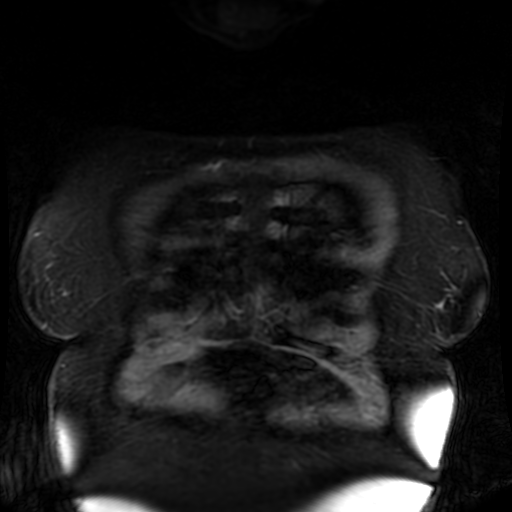

[Series 700: cemra ft mph · sagittal · 3.0mm · 0.78mm/px · 1 of 52 slices shown]
[im 1/52]
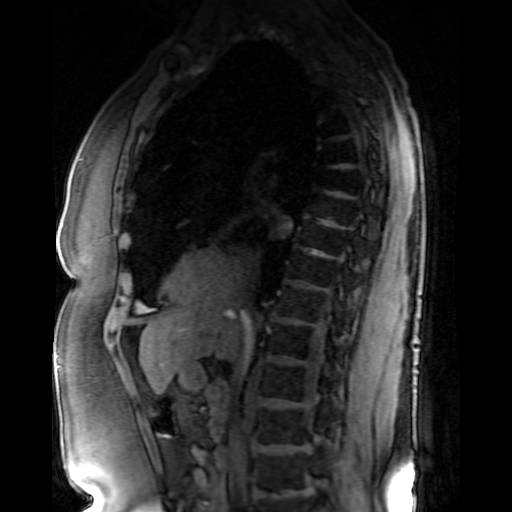

[Series 701: ph1/cemra ft mph · sagittal · 3.0mm · 0.78mm/px · 1 of 52 slices shown]
[im 1/52]
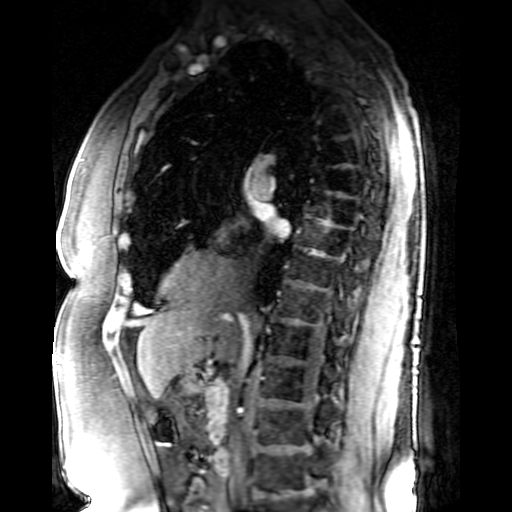

[Series 702: ph2/cemra ft mph · sagittal · 3.0mm · 0.78mm/px · 1 of 52 slices shown]
[im 1/52]
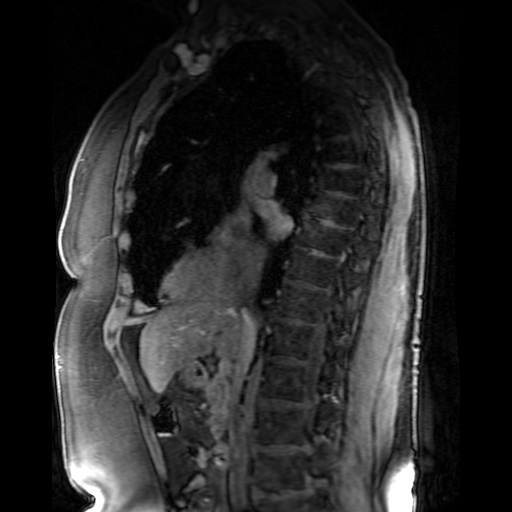

[((date))-((date)) · sagittal · 3.0mm · 0.78mm/px · 1 of 52 slices shown (1 of 2)]
[im 1/52]
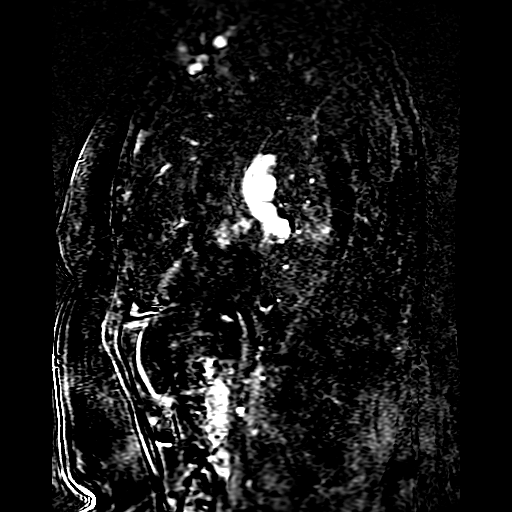

[((date))-((date)) · sagittal · 3.0mm · 0.78mm/px · 1 of 52 slices shown (2 of 2)]
[im 1/52]
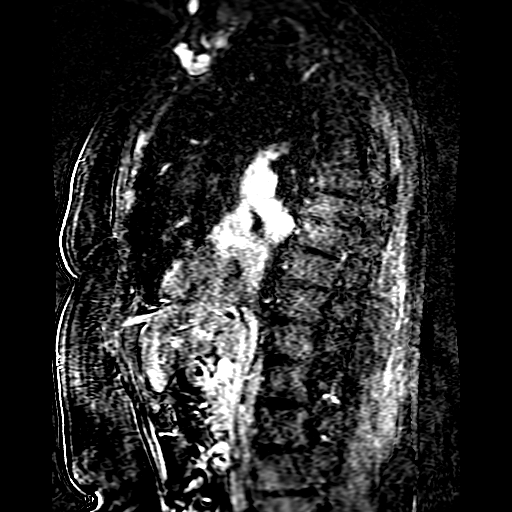

[projection images · sagittal · 3.0mm · 0.35mm/px · 1 of 14 slices shown]
[im 1/14]
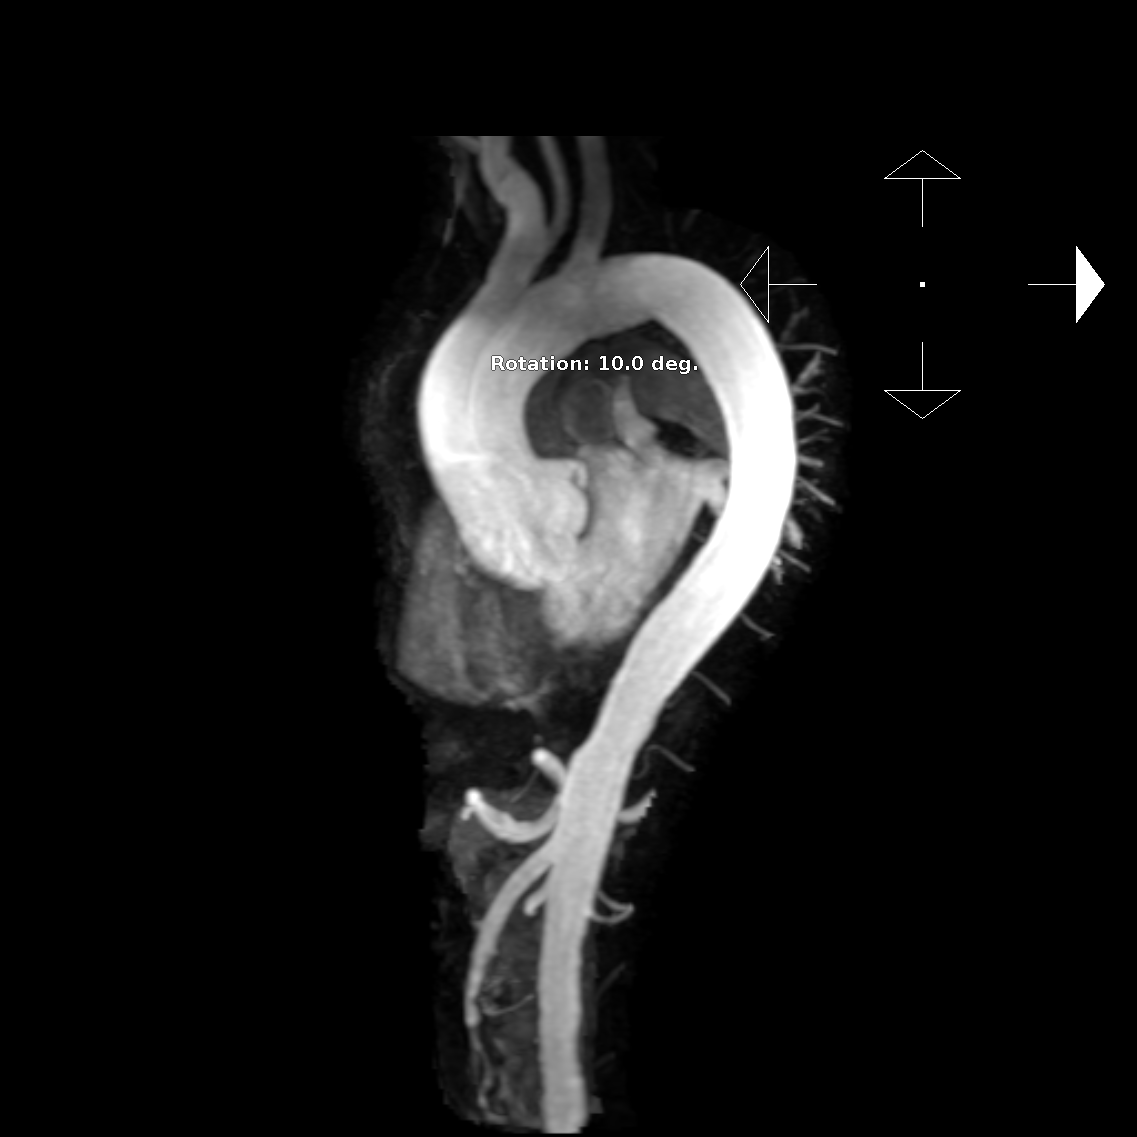

[15 of 16 positions shown; findings below may reference images not displayed]

FINDINGS: Cardiovascular: Preferential opacification of the thoracic aorta.
The ascending thoracic aorta measures up to 4.0 x 3.8 cm, stable in
size. Note is made of a common origin of the right brachiocephalic
and left common carotid arteries. The heart is normal in size. No
pericardial effusion.

Mediastinum/Nodes: No enlarged mediastinal, hilar, or axillary lymph
nodes.

Lungs/Pleura: No pleural effusion.  No lobar consolidation.

Musculoskeletal: No aggressive osseous lesions.

Upper abdomen: The visualized upper abdomen is unremarkable.
IMPRESSION: VASCULAR

1. Stable appearance of borderline ascending thoracic aortic
aneurysm measuring 4.0 cm. Recommend annual imaging followup by CTA
or MRA. This recommendation follows [V6]
ACCF/AHA/AATS/ACR/ASA/SCA/THEOKLIS/THEOKLIS/THEOKLIS/THEOKLIS Guidelines for the
Diagnosis and Management of Patients with Thoracic Aortic Disease.
Circulation. [V6]; 121: [id number]. Aortic aneurysm NOS ([V6]-[V6])

NON-VASCULAR

1. No acute extravascular findings in the chest.

## 2022-03-08 MED ORDER — GADOBUTROL 1 MMOL/ML IV SOLN
8.0000 mL | Freq: Once | INTRAVENOUS | Status: AC | PRN
  Administered 2022-03-08: 8 mL via INTRAVENOUS

## 2022-03-16 ENCOUNTER — Ambulatory Visit: Payer: Medicare Other | Admitting: Nurse Practitioner

## 2022-03-27 ENCOUNTER — Encounter: Payer: Self-pay | Admitting: Nurse Practitioner

## 2022-03-27 ENCOUNTER — Ambulatory Visit (INDEPENDENT_AMBULATORY_CARE_PROVIDER_SITE_OTHER): Payer: Medicare Other | Admitting: Nurse Practitioner

## 2022-03-27 VITALS — BP 122/68 | HR 65 | Temp 98.0°F | Ht 67.0 in | Wt 200.0 lb

## 2022-03-27 DIAGNOSIS — R2231 Localized swelling, mass and lump, right upper limb: Secondary | ICD-10-CM | POA: Diagnosis not present

## 2022-03-27 DIAGNOSIS — E6609 Other obesity due to excess calories: Secondary | ICD-10-CM

## 2022-03-27 DIAGNOSIS — E7800 Pure hypercholesterolemia, unspecified: Secondary | ICD-10-CM | POA: Diagnosis not present

## 2022-03-27 DIAGNOSIS — Z6831 Body mass index (BMI) 31.0-31.9, adult: Secondary | ICD-10-CM | POA: Diagnosis not present

## 2022-03-27 DIAGNOSIS — I10 Essential (primary) hypertension: Secondary | ICD-10-CM

## 2022-03-27 NOTE — Progress Notes (Signed)
I,[name],acting as a Education administrator for [employer], FNP.,have documented all relevant documentation on the behalf of [name], FNP,as directed by  [name], FNP while in the presence of [name], [place].  This visit occurred during the SARS-CoV-2 public health emergency.  Safety protocols were in place, including screening questions prior to the visit, additional usage of staff PPE, and extensive cleaning of exam room while observing appropriate contact time as indicated for disinfecting solutions.  Subjective:     Patient ID: Ryan Craig , male    DOB: 07/02/1946 , 76 y.o.   MRN: [id number]   Chief Complaint  Patient presents with   Hypertension    HPI  Patient is here for htn follow up.      Hypertension This is a chronic problem. The current episode started more than 1 year ago. The problem is unchanged. The problem is controlled. Pertinent negatives include no chest pain, headaches, palpitations or shortness of breath. There are no associated agents to hypertension. Risk factors for coronary artery disease include dyslipidemia, obesity and male gender. Past treatments include ACE inhibitors and calcium channel blockers. The current treatment provides significant improvement. There are no compliance problems.  There is no history of angina. There is no history of chronic renal disease.     Past Medical History:  Diagnosis Date   Anemia    no on any meds   Arthritis    Chronic back pain    deteriorating   Hyperlipidemia    was on Lipitor but was taken off 3-26yr ago by medical md bc running liver enzymes up   Hypertension    takes Amlodipine and Quinapril daily   Joint pain    Joint swelling      Family History  Problem Relation Age of Onset   Heart attack Mother    Cancer Maternal Aunt    Cancer Paternal Aunt    Cancer Cousin      Current Outpatient Medications:    acetaminophen (TYLENOL) 500 MG tablet, Take 500 mg by mouth every 6 (six) hours as  needed., Disp: , Rfl:    amLODipine (NORVASC) 10 MG tablet, TAKE 1 TABLET BY MOUTH     DAILY, Disp: 90 tablet, Rfl: 3   aspirin EC 81 MG tablet, Take 81 mg by mouth daily., Disp: , Rfl:    baclofen (LIORESAL) 10 MG tablet, Take 1 tablet (10 mg total) by mouth 3 (three) times daily as needed for muscle spasms., Disp: 30 each, Rfl: 0   BLACK CURRANT SEED OIL PO, Take by mouth., Disp: , Rfl:    Cholecalciferol (VITAMIN D-3 PO), Take 2 tablets by mouth daily. , Disp: , Rfl:    diazepam (VALIUM) 5 MG tablet, TAKE ONE TAB ONE HOUR PRIOR TO INJECTION REPEAT AS NEEDED #2 . ZERO REFILLS, Disp: 2 tablet, Rfl: 0   diclofenac Sodium (VOLTAREN) 1 % GEL, Apply 2 g topically 4 (four) times daily. (Patient not taking: Reported on 02/14/2022), Disp: 100 g, Rfl: 2   HYDROcodone-acetaminophen (NORCO/VICODIN) 5-325 MG tablet, Take 1 tablet by mouth 3 (three) times daily as needed for moderate pain., Disp: 40 tablet, Rfl: 0   lisinopril (ZESTRIL) 10 MG tablet, Take 1 tablet (10 mg total) by mouth daily. (Patient not taking: Reported on 02/14/2022), Disp: 90 tablet, Rfl: 1   methylPREDNISolone (MEDROL) 4 MG tablet, Medrol dose pack. Take as instructed, Disp: 21 tablet, Rfl: 0   metoprolol succinate (TOPROL-XL) 25 MG 24 hr tablet, TAKE 1 TABLET (25  MG TOTAL) BY MOUTH DAILY., Disp: 90 tablet, Rfl: 3   rosuvastatin (CRESTOR) 40 MG tablet, Take 40 mg by mouth daily., Disp: , Rfl:    triamcinolone cream (KENALOG) 0.1 %, APPLY TOPICALLY A THIN LAYER TO THE AFFECTED AREA(S) TWICE A DAY, Disp: 454 g, Rfl: 0   Allergies  Allergen Reactions   Contrast Media [Iodinated Contrast Media] Anaphylaxis   Latex Rash     Review of Systems  Constitutional: Negative.  Negative for fatigue.  Respiratory: Negative.  Negative for shortness of breath and wheezing.   Cardiovascular:  Negative for chest pain, palpitations and leg swelling.  Skin:        He had an MRA 2 weeks ago with the cardiologist and now feels a knot to his right elbow.    Neurological:  Negative for dizziness and headaches.  Psychiatric/Behavioral: Negative.       Today's Vitals   03/27/22 0914  BP: 122/68  Pulse: 65  Temp: 98 F (36.7 C)  TempSrc: Oral  Weight: 200 lb (90.7 kg)  Height: '5\' 7"'$  (1.702 m)   Body mass index is 31.32 kg/m.  Wt Readings from Last 3 Encounters:  03/27/22 200 lb (90.7 kg)  02/14/22 205 lb (93 kg)  12/07/21 201 lb (91.2 kg)    Objective:  Physical Exam Vitals reviewed.  Constitutional:      General: He is not in acute distress.    Appearance: Normal appearance. He is obese.  Cardiovascular:     Rate and Rhythm: Normal rate and regular rhythm.     Pulses: Normal pulses.     Heart sounds: Normal heart sounds. No murmur heard. Pulmonary:     Effort: Pulmonary effort is normal. No respiratory distress.     Breath sounds: Normal breath sounds. No wheezing.  Abdominal:     Tenderness: There is no right CVA tenderness or left CVA tenderness.  Skin:    General: Skin is warm and dry.     Capillary Refill: Capillary refill takes less than 2 seconds.     Comments: Right AC with semi soft palpable mass  Neurological:     General: No focal deficit present.     Mental Status: He is alert and oriented to person, place, and time.     Cranial Nerves: No cranial nerve deficit.     Motor: No weakness.  Psychiatric:        Mood and Affect: Mood normal.        Behavior: Behavior normal.        Thought Content: Thought content normal.        Judgment: Judgment normal.         Assessment And Plan:     1. Essential hypertension Comments: Blood pressure is well controlled. Continue current medications.   2. Pure hypercholesterolemia Comments: Stable, continue statin, tolerating well.  - Lipid panel  3. Skin lump of arm, right Comments: Firm lump to right AC, advised to apply warm compress to see if this helps.   4. Class 1 obesity due to excess calories with serious comorbidity and body mass index (BMI) of 31.0 to  31.9 in adult He is encouraged to initially strive for BMI less than 30 to decrease cardiac risk. He is advised to exercise no less than 150 minutes per week.     Patient was given opportunity to ask questions. Patient verbalized understanding of the plan and was able to repeat key elements of the plan. All questions were answered to  their satisfaction.  [name], FNP   I, [name], FNP, have reviewed all documentation for this visit. The documentation on 03/27/22 for the exam, diagnosis, procedures, and orders are all accurate and complete.   IF YOU HAVE BEEN REFERRED TO A SPECIALIST, IT MAY TAKE 1-2 WEEKS TO SCHEDULE/PROCESS THE REFERRAL. IF YOU HAVE NOT HEARD FROM US/SPECIALIST IN TWO WEEKS, PLEASE GIVE [place] A CALL AT [phone] X [phone].   THE PATIENT IS ENCOURAGED TO PRACTICE SOCIAL DISTANCING DUE TO THE COVID-19 PANDEMIC.

## 2022-03-27 NOTE — Patient Instructions (Signed)
Hypertension, Adult High blood pressure (hypertension) is when the force of blood pumping through the arteries is too strong. The arteries are the blood vessels that carry blood from the heart throughout the body. Hypertension forces the heart to work harder to pump blood and may cause arteries to become narrow or stiff. Untreated or uncontrolled hypertension can lead to a heart attack, heart failure, a stroke, kidney disease, and other problems. A blood pressure reading consists of a higher number over a lower number. Ideally, your blood pressure should be below 120/80. The first ("top") number is called the systolic pressure. It is a measure of the pressure in your arteries as your heart beats. The second ("bottom") number is called the diastolic pressure. It is a measure of the pressure in your arteries as the heart relaxes. What are the causes? The exact cause of this condition is not known. There are some conditions that result in high blood pressure. What increases the risk? Certain factors may make you more likely to develop high blood pressure. Some of these risk factors are under your control, including: Smoking. Not getting enough exercise or physical activity. Being overweight. Having too much fat, sugar, calories, or salt (sodium) in your diet. Drinking too much alcohol. Other risk factors include: Having a personal history of heart disease, diabetes, high cholesterol, or kidney disease. Stress. Having a family history of high blood pressure and high cholesterol. Having obstructive sleep apnea. Age. The risk increases with age. What are the signs or symptoms? High blood pressure may not cause symptoms. Very high blood pressure (hypertensive crisis) may cause: Headache. Fast or irregular heartbeats (palpitations). Shortness of breath. Nosebleed. Nausea and vomiting. Vision changes. Severe chest pain, dizziness, and seizures. How is this diagnosed? This condition is diagnosed by  measuring your blood pressure while you are seated, with your arm resting on a flat surface, your legs uncrossed, and your feet flat on the floor. The cuff of the blood pressure monitor will be placed directly against the skin of your upper arm at the level of your heart. Blood pressure should be measured at least twice using the same arm. Certain conditions can cause a difference in blood pressure between your right and left arms. If you have a high blood pressure reading during one visit or you have normal blood pressure with other risk factors, you may be asked to: Return on a different day to have your blood pressure checked again. Monitor your blood pressure at home for 1 week or longer. If you are diagnosed with hypertension, you may have other blood or imaging tests to help your health care provider understand your overall risk for other conditions. How is this treated? This condition is treated by making healthy lifestyle changes, such as eating healthy foods, exercising more, and reducing your alcohol intake. You may be referred for counseling on a healthy diet and physical activity. Your health care provider may prescribe medicine if lifestyle changes are not enough to get your blood pressure under control and if: Your systolic blood pressure is above 130. Your diastolic blood pressure is above 80. Your personal target blood pressure may vary depending on your medical conditions, your age, and other factors. Follow these instructions at home: Eating and drinking  Eat a diet that is high in fiber and potassium, and low in sodium, added sugar, and fat. An example of this eating plan is called the DASH diet. DASH stands for Dietary Approaches to Stop Hypertension. To eat this way: Eat   plenty of fresh fruits and vegetables. Try to fill one half of your plate at each meal with fruits and vegetables. Eat whole grains, such as whole-wheat pasta, brown rice, or whole-grain bread. Fill about one  fourth of your plate with whole grains. Eat or drink low-fat dairy products, such as skim milk or low-fat yogurt. Avoid fatty cuts of meat, processed or cured meats, and poultry with skin. Fill about one fourth of your plate with lean proteins, such as fish, chicken without skin, beans, eggs, or tofu. Avoid pre-made and processed foods. These tend to be higher in sodium, added sugar, and fat. Reduce your daily sodium intake. Many people with hypertension should eat less than 1,500 mg of sodium a day. Do not drink alcohol if: Your health care provider tells you not to drink. You are pregnant, may be pregnant, or are planning to become pregnant. If you drink alcohol: Limit how much you have to: 0-1 drink a day for women. 0-2 drinks a day for men. Know how much alcohol is in your drink. In the [place] one drink equals one 12 oz bottle of beer (355 mL), one 5 oz glass of wine (148 mL), or one 1 oz glass of hard liquor (44 mL). Lifestyle  Work with your health care provider to maintain a healthy body weight or to lose weight. Ask what an ideal weight is for you. Get at least 30 minutes of exercise that causes your heart to beat faster (aerobic exercise) most days of the week. Activities may include walking, swimming, or biking. Include exercise to strengthen your muscles (resistance exercise), such as Pilates or lifting weights, as part of your weekly exercise routine. Try to do these types of exercises for 30 minutes at least 3 days a week. Do not use any products that contain nicotine or tobacco. These products include cigarettes, chewing tobacco, and vaping devices, such as e-cigarettes. If you need help quitting, ask your health care provider. Monitor your blood pressure at home as told by your health care provider. Keep all follow-up visits. This is important. Medicines Take over-the-counter and prescription medicines only as told by your health care provider. Follow directions carefully. Blood  pressure medicines must be taken as prescribed. Do not skip doses of blood pressure medicine. Doing this puts you at risk for problems and can make the medicine less effective. Ask your health care provider about side effects or reactions to medicines that you should watch for. Contact a health care provider if you: Think you are having a reaction to a medicine you are taking. Have headaches that keep coming back (recurring). Feel dizzy. Have swelling in your ankles. Have trouble with your vision. Get help right away if you: Develop a severe headache or confusion. Have unusual weakness or numbness. Feel faint. Have severe pain in your chest or abdomen. Vomit repeatedly. Have trouble breathing. These symptoms may be an emergency. Get help right away. Call 911. Do not wait to see if the symptoms will go away. Do not drive yourself to the hospital. Summary Hypertension is when the force of blood pumping through your arteries is too strong. If this condition is not controlled, it may put you at risk for serious complications. Your personal target blood pressure may vary depending on your medical conditions, your age, and other factors. For most people, a normal blood pressure is less than 120/80. Hypertension is treated with lifestyle changes, medicines, or a combination of both. Lifestyle changes include losing weight, eating a healthy,   low-sodium diet, exercising more, and limiting alcohol. This information is not intended to replace advice given to you by your health care provider. Make sure you discuss any questions you have with your health care provider. Document Revised: 08/16/2021 Document Reviewed: 08/16/2021 Elsevier Patient Education  2023 Elsevier Inc.  

## 2022-03-28 LAB — LIPID PANEL
Chol/HDL Ratio: 2.4 ratio (ref 0.0–5.0)
Cholesterol, Total: 131 mg/dL (ref 100–199)
HDL: 54 mg/dL (ref >39)
LDL Chol Calc (NIH): 59 mg/dL (ref 0–99)
Triglycerides: 99 mg/dL (ref 0–149)
VLDL Cholesterol Cal: 18 mg/dL (ref 5–40)

## 2022-04-06 ENCOUNTER — Telehealth: Payer: Self-pay | Admitting: Orthopaedic Surgery

## 2022-04-06 ENCOUNTER — Other Ambulatory Visit: Payer: Self-pay | Admitting: Orthopaedic Surgery

## 2022-04-06 MED ORDER — HYDROCODONE-ACETAMINOPHEN 5-325 MG PO TABS: 1.0000 | ORAL_TABLET | Freq: Three times a day (TID) | ORAL | 0 refills | Status: DC | PRN

## 2022-04-06 NOTE — Telephone Encounter (Signed)
Patient requesting a pain medication refill of Hydrocodone. He only takes a few a week to help with sleeping please advise.

## 2022-04-06 NOTE — Telephone Encounter (Signed)
Please advise 

## 2022-04-06 NOTE — Telephone Encounter (Signed)
Pt called and informed. He stated understanding. He stated he needs to come back in and see Dr. Ninfa Linden. States he is in so much pain. He will schedule an appt soon

## 2022-04-13 ENCOUNTER — Encounter: Payer: Self-pay | Admitting: Physician Assistant

## 2022-04-14 ENCOUNTER — Encounter: Payer: Self-pay | Admitting: Physician Assistant

## 2022-04-14 ENCOUNTER — Ambulatory Visit (INDEPENDENT_AMBULATORY_CARE_PROVIDER_SITE_OTHER): Payer: Medicare Other | Admitting: Physician Assistant

## 2022-04-14 VITALS — BP 118/58 | HR 68 | Ht 67.0 in | Wt 202.4 lb

## 2022-04-14 DIAGNOSIS — I340 Nonrheumatic mitral (valve) insufficiency: Secondary | ICD-10-CM

## 2022-04-14 DIAGNOSIS — I493 Ventricular premature depolarization: Secondary | ICD-10-CM

## 2022-04-14 DIAGNOSIS — I712 Thoracic aortic aneurysm, without rupture, unspecified: Secondary | ICD-10-CM

## 2022-04-14 DIAGNOSIS — I452 Bifascicular block: Secondary | ICD-10-CM

## 2022-04-14 DIAGNOSIS — I491 Atrial premature depolarization: Secondary | ICD-10-CM | POA: Diagnosis not present

## 2022-04-14 DIAGNOSIS — R0789 Other chest pain: Secondary | ICD-10-CM

## 2022-04-14 DIAGNOSIS — I2510 Atherosclerotic heart disease of native coronary artery without angina pectoris: Secondary | ICD-10-CM | POA: Diagnosis not present

## 2022-04-14 DIAGNOSIS — I471 Supraventricular tachycardia: Secondary | ICD-10-CM | POA: Diagnosis not present

## 2022-04-14 DIAGNOSIS — I351 Nonrheumatic aortic (valve) insufficiency: Secondary | ICD-10-CM

## 2022-04-14 DIAGNOSIS — Z8679 Personal history of other diseases of the circulatory system: Secondary | ICD-10-CM | POA: Diagnosis not present

## 2022-04-14 DIAGNOSIS — E785 Hyperlipidemia, unspecified: Secondary | ICD-10-CM | POA: Diagnosis not present

## 2022-04-15 LAB — BASIC METABOLIC PANEL
BUN/Creatinine Ratio: 18 (ref 10–24)
BUN: 17 mg/dL (ref 8–27)
CO2: 21 mmol/L (ref 20–29)
Calcium: 9.7 mg/dL (ref 8.6–10.2)
Chloride: 104 mmol/L (ref 96–106)
Creatinine, Ser: 0.94 mg/dL (ref 0.76–1.27)
Glucose: 111 mg/dL — ABNORMAL HIGH (ref 70–99)
Potassium: 4.6 mmol/L (ref 3.5–5.2)
Sodium: 139 mmol/L (ref 134–144)
eGFR: 83 mL/min/{1.73_m2} (ref >59)

## 2022-04-15 LAB — CBC
Hematocrit: 38 % (ref 37.5–51.0)
Hemoglobin: 12.6 g/dL — ABNORMAL LOW (ref 13.0–17.7)
MCH: 29.3 pg (ref 26.6–33.0)
MCHC: 33.2 g/dL (ref 31.5–35.7)
MCV: 88 fL (ref 79–97)
Platelets: 314 10*3/uL (ref 150–450)
RBC: 4.3 x10E6/uL (ref 4.14–5.80)
RDW: 11.8 % (ref 11.6–15.4)
WBC: 5.9 10*3/uL (ref 3.4–10.8)

## 2022-04-15 LAB — TSH: TSH: 1.06 u[IU]/mL (ref 0.450–4.500)

## 2022-04-17 ENCOUNTER — Telehealth: Payer: Self-pay

## 2022-05-15 ENCOUNTER — Other Ambulatory Visit: Payer: Self-pay | Admitting: Cardiology

## 2022-05-15 ENCOUNTER — Other Ambulatory Visit: Payer: Self-pay | Admitting: Nurse Practitioner

## 2022-05-15 DIAGNOSIS — I10 Essential (primary) hypertension: Secondary | ICD-10-CM

## 2022-07-27 ENCOUNTER — Ambulatory Visit: Payer: Medicare Other | Admitting: Nurse Practitioner

## 2022-08-03 ENCOUNTER — Ambulatory Visit (INDEPENDENT_AMBULATORY_CARE_PROVIDER_SITE_OTHER): Payer: Medicare Other

## 2022-08-03 VITALS — BP 130/70 | HR 85 | Temp 97.7°F | Ht 67.0 in | Wt 202.0 lb

## 2022-08-03 DIAGNOSIS — Z23 Encounter for immunization: Secondary | ICD-10-CM

## 2022-08-03 NOTE — Progress Notes (Signed)
Patient presents today for a flu vaccine. Ryan Craig

## 2022-08-14 ENCOUNTER — Other Ambulatory Visit: Payer: Self-pay | Admitting: Nurse Practitioner

## 2022-08-15 DIAGNOSIS — Z23 Encounter for immunization: Secondary | ICD-10-CM | POA: Diagnosis not present

## 2022-08-28 ENCOUNTER — Ambulatory Visit (INDEPENDENT_AMBULATORY_CARE_PROVIDER_SITE_OTHER): Payer: Medicare Other | Admitting: Nurse Practitioner

## 2022-08-28 ENCOUNTER — Encounter: Payer: Self-pay | Admitting: Nurse Practitioner

## 2022-08-28 VITALS — BP 128/72 | HR 68 | Temp 98.1°F | Ht 67.0 in | Wt 199.0 lb

## 2022-08-28 DIAGNOSIS — Z6831 Body mass index (BMI) 31.0-31.9, adult: Secondary | ICD-10-CM | POA: Diagnosis not present

## 2022-08-28 DIAGNOSIS — R413 Other amnesia: Secondary | ICD-10-CM | POA: Diagnosis not present

## 2022-08-28 DIAGNOSIS — E7800 Pure hypercholesterolemia, unspecified: Secondary | ICD-10-CM

## 2022-08-28 DIAGNOSIS — Z2821 Immunization not carried out because of patient refusal: Secondary | ICD-10-CM

## 2022-08-28 DIAGNOSIS — E6609 Other obesity due to excess calories: Secondary | ICD-10-CM

## 2022-08-28 DIAGNOSIS — I7121 Aneurysm of the ascending aorta, without rupture: Secondary | ICD-10-CM

## 2022-08-28 DIAGNOSIS — I10 Essential (primary) hypertension: Secondary | ICD-10-CM | POA: Diagnosis not present

## 2022-08-28 NOTE — Patient Instructions (Signed)
Hypertension, Adult High blood pressure (hypertension) is when the force of blood pumping through the arteries is too strong. The arteries are the blood vessels that carry blood from the heart throughout the body. Hypertension forces the heart to work harder to pump blood and may cause arteries to become narrow or stiff. Untreated or uncontrolled hypertension can lead to a heart attack, heart failure, a stroke, kidney disease, and other problems. A blood pressure reading consists of a higher number over a lower number. Ideally, your blood pressure should be below 120/80. The first ("top") number is called the systolic pressure. It is a measure of the pressure in your arteries as your heart beats. The second ("bottom") number is called the diastolic pressure. It is a measure of the pressure in your arteries as the heart relaxes. What are the causes? The exact cause of this condition is not known. There are some conditions that result in high blood pressure. What increases the risk? Certain factors may make you more likely to develop high blood pressure. Some of these risk factors are under your control, including: Smoking. Not getting enough exercise or physical activity. Being overweight. Having too much fat, sugar, calories, or salt (sodium) in your diet. Drinking too much alcohol. Other risk factors include: Having a personal history of heart disease, diabetes, high cholesterol, or kidney disease. Stress. Having a family history of high blood pressure and high cholesterol. Having obstructive sleep apnea. Age. The risk increases with age. What are the signs or symptoms? High blood pressure may not cause symptoms. Very high blood pressure (hypertensive crisis) may cause: Headache. Fast or irregular heartbeats (palpitations). Shortness of breath. Nosebleed. Nausea and vomiting. Vision changes. Severe chest pain, dizziness, and seizures. How is this diagnosed? This condition is diagnosed by  measuring your blood pressure while you are seated, with your arm resting on a flat surface, your legs uncrossed, and your feet flat on the floor. The cuff of the blood pressure monitor will be placed directly against the skin of your upper arm at the level of your heart. Blood pressure should be measured at least twice using the same arm. Certain conditions can cause a difference in blood pressure between your right and left arms. If you have a high blood pressure reading during one visit or you have normal blood pressure with other risk factors, you may be asked to: Return on a different day to have your blood pressure checked again. Monitor your blood pressure at home for 1 week or longer. If you are diagnosed with hypertension, you may have other blood or imaging tests to help your health care provider understand your overall risk for other conditions. How is this treated? This condition is treated by making healthy lifestyle changes, such as eating healthy foods, exercising more, and reducing your alcohol intake. You may be referred for counseling on a healthy diet and physical activity. Your health care provider may prescribe medicine if lifestyle changes are not enough to get your blood pressure under control and if: Your systolic blood pressure is above 130. Your diastolic blood pressure is above 80. Your personal target blood pressure may vary depending on your medical conditions, your age, and other factors. Follow these instructions at home: Eating and drinking  Eat a diet that is high in fiber and potassium, and low in sodium, added sugar, and fat. An example of this eating plan is called the DASH diet. DASH stands for Dietary Approaches to Stop Hypertension. To eat this way: Eat   plenty of fresh fruits and vegetables. Try to fill one half of your plate at each meal with fruits and vegetables. Eat whole grains, such as whole-wheat pasta, brown rice, or whole-grain bread. Fill about one  fourth of your plate with whole grains. Eat or drink low-fat dairy products, such as skim milk or low-fat yogurt. Avoid fatty cuts of meat, processed or cured meats, and poultry with skin. Fill about one fourth of your plate with lean proteins, such as fish, chicken without skin, beans, eggs, or tofu. Avoid pre-made and processed foods. These tend to be higher in sodium, added sugar, and fat. Reduce your daily sodium intake. Many people with hypertension should eat less than 1,500 mg of sodium a day. Do not drink alcohol if: Your health care provider tells you not to drink. You are pregnant, may be pregnant, or are planning to become pregnant. If you drink alcohol: Limit how much you have to: 0-1 drink a day for women. 0-2 drinks a day for men. Know how much alcohol is in your drink. In the [place] one drink equals one 12 oz bottle of beer (355 mL), one 5 oz glass of wine (148 mL), or one 1 oz glass of hard liquor (44 mL). Lifestyle  Work with your health care provider to maintain a healthy body weight or to lose weight. Ask what an ideal weight is for you. Get at least 30 minutes of exercise that causes your heart to beat faster (aerobic exercise) most days of the week. Activities may include walking, swimming, or biking. Include exercise to strengthen your muscles (resistance exercise), such as Pilates or lifting weights, as part of your weekly exercise routine. Try to do these types of exercises for 30 minutes at least 3 days a week. Do not use any products that contain nicotine or tobacco. These products include cigarettes, chewing tobacco, and vaping devices, such as e-cigarettes. If you need help quitting, ask your health care provider. Monitor your blood pressure at home as told by your health care provider. Keep all follow-up visits. This is important. Medicines Take over-the-counter and prescription medicines only as told by your health care provider. Follow directions carefully. Blood  pressure medicines must be taken as prescribed. Do not skip doses of blood pressure medicine. Doing this puts you at risk for problems and can make the medicine less effective. Ask your health care provider about side effects or reactions to medicines that you should watch for. Contact a health care provider if you: Think you are having a reaction to a medicine you are taking. Have headaches that keep coming back (recurring). Feel dizzy. Have swelling in your ankles. Have trouble with your vision. Get help right away if you: Develop a severe headache or confusion. Have unusual weakness or numbness. Feel faint. Have severe pain in your chest or abdomen. Vomit repeatedly. Have trouble breathing. These symptoms may be an emergency. Get help right away. Call 911. Do not wait to see if the symptoms will go away. Do not drive yourself to the hospital. Summary Hypertension is when the force of blood pumping through your arteries is too strong. If this condition is not controlled, it may put you at risk for serious complications. Your personal target blood pressure may vary depending on your medical conditions, your age, and other factors. For most people, a normal blood pressure is less than 120/80. Hypertension is treated with lifestyle changes, medicines, or a combination of both. Lifestyle changes include losing weight, eating a healthy,   low-sodium diet, exercising more, and limiting alcohol. This information is not intended to replace advice given to you by your health care provider. Make sure you discuss any questions you have with your health care provider. Document Revised: 08/16/2021 Document Reviewed: 08/16/2021 Elsevier Patient Education  2023 Elsevier Inc.  

## 2022-08-28 NOTE — Progress Notes (Signed)
I,[name],acting as a Education administrator for [employer], FNP.,have documented all relevant documentation on the behalf of [name], FNP,as directed by  [name], FNP while in the presence of [name], [place].  Subjective:     Patient ID: Ryan Craig , male    DOB: [birth year] , 76 y.o.   MRN: [id number]   Chief Complaint  Patient presents with   Hypertension    HPI  Patient is here for htn follow up.  He does not feel like he has a problem with his memory. His wife shared with me her concerns about his memory. She had shared he had gotten turned around once when driving home he was able to make it home and told her about it at a later time and has been more agitated.    Hypertension This is a chronic problem. The current episode started more than 1 year ago. The problem is unchanged. The problem is controlled. Pertinent negatives include no chest pain, headaches, palpitations or shortness of breath. There are no associated agents to hypertension. Risk factors for coronary artery disease include dyslipidemia, obesity and male gender. Past treatments include ACE inhibitors and calcium channel blockers. The current treatment provides significant improvement. There are no compliance problems.  There is no history of angina. There is no history of chronic renal disease.     Past Medical History:  Diagnosis Date   Anemia    no on any meds   Arthritis    Bifascicular block    Chronic back pain    deteriorating   Hyperlipidemia    Hypertension    Joint pain    Joint swelling    Mild CAD    Myocarditis (HCC)    PAT (paroxysmal atrial tachycardia)    Premature atrial contractions    PVC's (premature ventricular contractions)    Thoracic aortic aneurysm (TAA) (HCC)      Family History  Problem Relation Age of Onset   Heart attack Mother    Cancer Maternal Aunt    Cancer Paternal Aunt    Cancer Cousin      Current Outpatient Medications:    acetaminophen (TYLENOL) 500  MG tablet, Take 500 mg by mouth every 6 (six) hours as needed., Disp: , Rfl:    amLODipine (NORVASC) 10 MG tablet, TAKE 1 TABLET BY MOUTH     DAILY, Disp: 90 tablet, Rfl: 3   aspirin EC 81 MG tablet, Take 81 mg by mouth daily., Disp: , Rfl:    BLACK CURRANT SEED OIL PO, Take by mouth., Disp: , Rfl:    Cholecalciferol (VITAMIN D-3 PO), Take 2 tablets by mouth daily. , Disp: , Rfl:    diazepam (VALIUM) 5 MG tablet, TAKE ONE TAB ONE HOUR PRIOR TO INJECTION REPEAT AS NEEDED #2 . ZERO REFILLS, Disp: 2 tablet, Rfl: 0   diclofenac Sodium (VOLTAREN) 1 % GEL, Apply 2 g topically 4 (four) times daily., Disp: 100 g, Rfl: 2   HYDROcodone-acetaminophen (NORCO/VICODIN) 5-325 MG tablet, Take 1 tablet by mouth 3 (three) times daily as needed for moderate pain., Disp: 40 tablet, Rfl: 0   lisinopril (ZESTRIL) 10 MG tablet, TAKE 1 TABLET BY MOUTH EVERY DAY, Disp: 90 tablet, Rfl: 1   metoprolol succinate (TOPROL-XL) 25 MG 24 hr tablet, TAKE 1 TABLET (25 MG TOTAL) BY MOUTH DAILY., Disp: 90 tablet, Rfl: 3   rosuvastatin (CRESTOR) 40 MG tablet, TAKE 1 TABLET BY MOUTH EVERY DAY, Disp: 90 tablet, Rfl: 3   triamcinolone cream (KENALOG)  0.1 %, APPLY TOPICALLY A THIN LAYER TO THE AFFECTED AREA(S) TWICE A DAY, Disp: 454 g, Rfl: 0   Allergies  Allergen Reactions   Contrast Media [Iodinated Contrast Media] Anaphylaxis   Latex Rash     Review of Systems  Constitutional: Negative.   Respiratory: Negative.  Negative for shortness of breath.   Cardiovascular: Negative.  Negative for chest pain, palpitations and leg swelling.  Gastrointestinal: Negative.   Musculoskeletal:        Right knee pain radiating up to his back which is worse at night. He has bilateral knee replacement.   Neurological: Negative.  Negative for headaches.  Psychiatric/Behavioral: Negative.       Today's Vitals   08/28/22 0851  BP: 128/72  Pulse: 68  Temp: 98.1 F (36.7 C)  TempSrc: Oral  Weight: 199 lb (90.3 kg)  Height: _0  (1.702 m)    Body mass index is 31.17 kg/m.  Wt Readings from Last 3 Encounters:  08/28/22 199 lb (90.3 kg)  08/03/22 202 lb (91.6 kg)  04/14/22 202 lb 6.4 oz (91.8 kg)    Objective:  Physical Exam Vitals reviewed.  Constitutional:      General: He is not in acute distress.    Appearance: Normal appearance.  Cardiovascular:     Pulses: Normal pulses.     Heart sounds: Normal heart sounds.  Pulmonary:     Effort: Pulmonary effort is normal. No respiratory distress.     Breath sounds: Normal breath sounds. No wheezing.  Skin:    General: Skin is warm.     Capillary Refill: Capillary refill takes less than 2 seconds.  Neurological:     General: No focal deficit present.     Mental Status: He is alert and oriented to person, place, and time.     Cranial Nerves: No cranial nerve deficit.     Motor: No weakness.  Psychiatric:        Attention and Perception: Attention and perception normal.        Mood and Affect: Mood and affect normal.        Speech: Speech normal.        Behavior: Behavior normal.        Cognition and Memory: He exhibits impaired recent memory.         Assessment And Plan:     1. Essential hypertension Comments: Blood pressure is well controlled, continue current medications - BMP8+EGFR - Microalbumin / Creatinine Urine Ratio  2. Pure hypercholesterolemia Comments: Continue statin, tolerating well - Lipid panel - Vitamin B12  3. Aneurysm of ascending aorta without rupture (HCC) Comments: Stable at 4 mm, continue f/u with Cardiology  4. Memory deficit Comments: His 6CIT numbers have increased today is 14, will check for any metabolic causes. Discussed the importance to make his family aware of any memory problems. - Vitamin B12 - TSH - RPR  5. Herpes zoster vaccination declined Comments: He does not want the vaccine states "1 out of 3 people get it and both of his sisters have already had it"  39. Class 1 obesity due to excess calories with serious  comorbidity and body mass index (BMI) of 31.0 to 31.9 in adult He is encouraged to initially strive for BMI less than 30 to decrease cardiac risk. He is advised to exercise no less than 150 minutes per week.     Patient was given opportunity to ask questions. Patient verbalized understanding of the plan and was able to repeat key  elements of the plan. All questions were answered to their satisfaction.  [name], FNP   I, [name], FNP, have reviewed all documentation for this visit. The documentation on 08/28/22 for the exam, diagnosis, procedures, and orders are all accurate and complete.   IF YOU HAVE BEEN REFERRED TO A SPECIALIST, IT MAY TAKE 1-2 WEEKS TO SCHEDULE/PROCESS THE REFERRAL. IF YOU HAVE NOT HEARD FROM US/SPECIALIST IN TWO WEEKS, PLEASE GIVE [place] A CALL AT [phone].   THE PATIENT IS ENCOURAGED TO PRACTICE SOCIAL DISTANCING DUE TO THE COVID-19 PANDEMIC.

## 2022-08-30 LAB — BMP8+EGFR
BUN/Creatinine Ratio: 15 (ref 10–24)
BUN: 14 mg/dL (ref 8–27)
CO2: 20 mmol/L (ref 20–29)
Calcium: 10 mg/dL (ref 8.6–10.2)
Chloride: 102 mmol/L (ref 96–106)
Creatinine, Ser: 0.92 mg/dL (ref 0.76–1.27)
Glucose: 100 mg/dL — ABNORMAL HIGH (ref 70–99)
Potassium: 4.4 mmol/L (ref 3.5–5.2)
Sodium: 141 mmol/L (ref 134–144)
eGFR: 85 mL/min/{1.73_m2} (ref >59)

## 2022-08-30 LAB — MICROALBUMIN / CREATININE URINE RATIO
Creatinine, Urine: 184.2 mg/dL
Microalb/Creat Ratio: 13 mg/g creat (ref 0–29)
Microalbumin, Urine: 23.6 ug/mL

## 2022-08-30 LAB — LIPID PANEL
Chol/HDL Ratio: 2.3 ratio (ref 0.0–5.0)
Cholesterol, Total: 132 mg/dL (ref 100–199)
HDL: 58 mg/dL (ref >39)
LDL Chol Calc (NIH): 55 mg/dL (ref 0–99)
Triglycerides: 106 mg/dL (ref 0–149)
VLDL Cholesterol Cal: 19 mg/dL (ref 5–40)

## 2022-08-30 LAB — RPR: RPR Ser Ql: NONREACTIVE

## 2022-08-30 LAB — TSH: TSH: 0.878 u[IU]/mL (ref 0.450–4.500)

## 2022-08-30 LAB — VITAMIN B12: Vitamin B-12: 302 pg/mL (ref 232–1245)

## 2022-11-09 ENCOUNTER — Other Ambulatory Visit: Payer: Self-pay | Admitting: Orthopaedic Surgery

## 2022-11-09 ENCOUNTER — Telehealth: Payer: Self-pay

## 2022-11-09 MED ORDER — TEMAZEPAM 15 MG PO CAPS: 15.0000 mg | ORAL_CAPSULE | Freq: Every evening | ORAL | 0 refills | Status: DC | PRN

## 2022-11-09 NOTE — Telephone Encounter (Signed)
Lvm advising  

## 2022-11-09 NOTE — Telephone Encounter (Signed)
Patient called triage. He states that he is having a very difficult time sleeping due to his back and knee. He is scheduled to see Dr.Blackman on 11/15/2022. He wants to know if Dr.Blackman can prescribe him something to help him sleep. Call back [phone]

## 2022-11-10 ENCOUNTER — Other Ambulatory Visit: Payer: Self-pay | Admitting: Nurse Practitioner

## 2022-11-10 DIAGNOSIS — I10 Essential (primary) hypertension: Secondary | ICD-10-CM

## 2022-11-14 ENCOUNTER — Other Ambulatory Visit: Payer: Self-pay

## 2022-11-14 MED ORDER — AMLODIPINE BESYLATE 10 MG PO TABS: 10.0000 mg | ORAL_TABLET | Freq: Every day | ORAL | 2 refills | Status: DC

## 2022-11-15 ENCOUNTER — Ambulatory Visit (INDEPENDENT_AMBULATORY_CARE_PROVIDER_SITE_OTHER): Payer: Medicare Other | Admitting: Orthopaedic Surgery

## 2022-11-15 ENCOUNTER — Ambulatory Visit (INDEPENDENT_AMBULATORY_CARE_PROVIDER_SITE_OTHER): Payer: Medicare Other

## 2022-11-15 ENCOUNTER — Encounter: Payer: Self-pay | Admitting: Orthopaedic Surgery

## 2022-11-15 DIAGNOSIS — Z96652 Presence of left artificial knee joint: Secondary | ICD-10-CM

## 2022-11-15 DIAGNOSIS — M7061 Trochanteric bursitis, right hip: Secondary | ICD-10-CM | POA: Diagnosis not present

## 2022-11-15 MED ORDER — METHYLPREDNISOLONE ACETATE 40 MG/ML IJ SUSP
40.0000 mg | INTRAMUSCULAR | Status: AC | PRN
  Administered 2022-11-15: 40 mg via INTRA_ARTICULAR

## 2022-11-15 MED ORDER — LIDOCAINE HCL 1 % IJ SOLN
3.0000 mL | INTRAMUSCULAR | Status: AC | PRN
  Administered 2022-11-15: 3 mL

## 2022-11-15 NOTE — Progress Notes (Signed)
I have seen the patient and agree with Benita Stabile, PA-C's assessment and plan.  Hopefully the trochanteric injection with stretching will help.

## 2022-11-15 NOTE — Progress Notes (Signed)
Office Visit Note   Patient: Ryan Craig           Date of Birth: [birth year]           MRN: [id number] Visit Date: 11/15/2022              Requested by: [name], [address] [place],  [place] [id number] PCP: [name], FNP   Assessment & Plan: Visit Diagnoses:  1. History of left knee replacement   2. Trochanteric bursitis, right hip     Plan:  Patient on IT band stretching exercises.  Obese has no improvement in the next couple of weeks he will return.  He is also to work on Forensic scientist.  Questions were encouraged and answered by Dr. Ninfa Linden and myself.  Postinjection patient states that his pain lateral aspect of the right leg was improved.  Follow-Up Instructions: Return if symptoms worsen or fail to improve.   Orders:  Orders Placed This Encounter  Procedures   XR Knee 1-2 Views Left   XR Knee 1-2 Views Right   No orders of the defined types were placed in this encounter.     Procedures: Large Joint Inj: R greater trochanter on 11/15/2022 11:27 AM Indications: pain Details: 22 G 1.5 in needle, lateral approach  Arthrogram: No  Medications: 3 mL lidocaine 1 %; 40 mg methylPREDNISolone acetate 40 MG/ML Outcome: tolerated well, no immediate complications Procedure, treatment alternatives, risks and benefits explained, specific risks discussed. Consent was given by the patient. Immediately prior to procedure a time out was called to verify the correct patient, procedure, equipment, support staff and site/side marked as required. Patient was prepped and draped in the usual sterile fashion.       Clinical Data: No additional findings.   Subjective: Chief Complaint  Patient presents with   Right Knee - Follow-up   Left Knee - Follow-up    HPI Ryan Craig comes in today for 4 knee pain.  He states his right knee pain is painful and swollen he cannot sleep at night.  He states whenever he lays on his right side he has  significant pain.  He denies any numbness tingling down either leg.  Pains been ongoing for couple months.  No known injury.  Positive for some back pain but no radicular symptoms down either leg.  Patient is nondiabetic.  Review of Systems  Constitutional:  Negative for chills and fever.     Objective: Vital Signs: There were no vitals taken for this visit.  Physical Exam Constitutional:      Appearance: He is not ill-appearing or diaphoretic.  Pulmonary:     Effort: Pulmonary effort is normal.  Neurological:     Mental Status: He is alert and oriented to person, place, and time.    Ortho Exam Bilateral knees good range of motion both knees.  No instability valgus varus stressing of either knee.  No abnormal warmth erythema of either knee.  No effusion either knee.  Surgical incisions are well-healed.  No significant tenderness either knee with palpation. Bilateral hips: Good range of motion of both hips without pain.  Tenderness over the right hip trochanteric region.  Straight leg raise is negative bilaterally. Specialty Comments:  No specialty comments available.  Imaging: XR Knee 1-2 Views Right  Result Date: 11/15/2022 Right knee 2 views: Status post right total knee arthroplasty well-seated components.  No acute fractures.  Knee is well located.  XR Knee 1-2 Views Left  Result Date: 11/15/2022 Left knee 2 views: Status post left total knee arthroplasty without complicating features.  Components are well-seated.  Knee is well located.  No acute fractures.    PMFS History: Patient Active Problem List   Diagnosis Date Noted   Class 1 obesity due to excess calories with serious comorbidity and body mass index (BMI) of 31.0 to 31.9 in adult 03/27/2022   HLD (hyperlipidemia) 07/23/2020   Chest pain 07/22/2020   Chest pain of uncertain etiology    Essential hypertension 01/22/2019   Persistent cough 01/22/2019   Right bundle branch block 11/21/2018   Left anterior  fascicular block 11/21/2018   Former smoker 11/21/2018   Obesity (BMI 30-39.9) 11/21/2018   Osteoarthritis of left knee 12/22/2014   Status post total left knee replacement 12/22/2014   Acute medial meniscus tear of left knee 02/24/2014   Past Medical History:  Diagnosis Date   Anemia    no on any meds   Arthritis    Bifascicular block    Chronic back pain    deteriorating   Hyperlipidemia    Hypertension    Joint pain    Joint swelling    Mild CAD    Myocarditis (HCC)    PAT (paroxysmal atrial tachycardia)    Premature atrial contractions    PVC's (premature ventricular contractions)    Thoracic aortic aneurysm (TAA) ([facility])     Family History  Problem Relation Age of Onset   Heart attack Mother    Cancer Maternal Aunt    Cancer Paternal Aunt    Cancer Cousin     Past Surgical History:  Procedure Laterality Date   arthroscopic knee surgery Right    COLONOSCOPY     INGUINAL HERNIA REPAIR Bilateral    x 4   JOINT REPLACEMENT Right 6 yrs ago   knee   KNEE ARTHROSCOPY Left 02/24/2014   Procedure: LEFT KNEE ARTHROSCOPY WITH PARTIAL MEDIAL MENISCECTOMY;  Surgeon: Mcarthur Rossetti, MD;  Location: [facility];  Service: Orthopedics;  Laterality: Left;   LEFT HEART CATH AND CORONARY ANGIOGRAPHY N/A 07/22/2020   Procedure: LEFT HEART CATH AND CORONARY ANGIOGRAPHY;  Surgeon: Burnell Blanks, MD;  Location: [facility];  Service: Cardiovascular;  Laterality: N/A;   TOTAL KNEE ARTHROPLASTY Left 12/22/2014   Procedure: LEFT TOTAL KNEE ARTHROPLASTY;  Surgeon: Mcarthur Rossetti, MD;  Location: [facility];  Service: Orthopedics;  Laterality: Left;   Social History   Occupational History   Occupation: retired  Tobacco Use   Smoking status: Former    Passive exposure: Past   Smokeless tobacco: Former   Tobacco comments:    quit smoking about 6-7ytrs ago  Scientific laboratory technician Use: Never used  Substance and Sexual Activity   Alcohol use: No    Comment: drank over 40  years ago   Drug use: No   Sexual activity: Not Currently

## 2022-11-17 ENCOUNTER — Telehealth: Payer: Self-pay | Admitting: Orthopaedic Surgery

## 2022-11-17 ENCOUNTER — Other Ambulatory Visit: Payer: Self-pay | Admitting: Orthopaedic Surgery

## 2022-11-17 MED ORDER — HYDROCODONE-ACETAMINOPHEN 5-325 MG PO TABS: 1.0000 | ORAL_TABLET | Freq: Three times a day (TID) | ORAL | 0 refills | Status: DC | PRN

## 2022-11-17 NOTE — Telephone Encounter (Signed)
Patient requesting refill on his pain medication.Marland Kitchen

## 2022-11-27 ENCOUNTER — Telehealth: Payer: Self-pay

## 2022-11-27 DIAGNOSIS — K625 Hemorrhage of anus and rectum: Secondary | ICD-10-CM | POA: Insufficient documentation

## 2022-11-27 NOTE — Telephone Encounter (Signed)
Patient LVM requesting refill on Amlodipine. Per chart this was sent 11/14/22 to [facility], #90, 2 rf.  LVM for return call to advise,

## 2022-11-28 ENCOUNTER — Other Ambulatory Visit: Payer: Self-pay

## 2022-11-28 ENCOUNTER — Other Ambulatory Visit: Payer: Self-pay | Admitting: Nurse Practitioner

## 2022-11-28 MED ORDER — AMLODIPINE BESYLATE 10 MG PO TABS: 10.0000 mg | ORAL_TABLET | Freq: Every day | ORAL | 0 refills | Status: DC

## 2022-11-28 NOTE — Telephone Encounter (Signed)
Spoke with patient and he states he will contact [facility] today to update his insurance information.   Patient asked for 7 day supply [facility] until he can receive the mail order supply. Rx sent to [facility] as requested.

## 2022-11-28 NOTE — Telephone Encounter (Signed)
Patient left another vm regarding his rx.  Attempted to reach patient, Ryan Craig.  Spoke with [facility], they do not have current Environmental consultant on file. They will need to speak with patient to update before they can send out refills.  [facility] [phone]

## 2022-12-06 ENCOUNTER — Other Ambulatory Visit: Payer: Self-pay | Admitting: Orthopaedic Surgery

## 2022-12-06 ENCOUNTER — Telehealth: Payer: Self-pay | Admitting: Orthopaedic Surgery

## 2022-12-06 MED ORDER — HYDROCODONE-ACETAMINOPHEN 5-325 MG PO TABS: 1.0000 | ORAL_TABLET | Freq: Two times a day (BID) | ORAL | 0 refills | Status: DC | PRN

## 2022-12-06 NOTE — Telephone Encounter (Signed)
Pt called requesting a refill of hydrocodone. Please send to -pharmacy on file. Pt phone number is [phone]

## 2022-12-13 ENCOUNTER — Ambulatory Visit: Payer: Medicare Other

## 2022-12-20 ENCOUNTER — Ambulatory Visit (INDEPENDENT_AMBULATORY_CARE_PROVIDER_SITE_OTHER): Payer: Medicare Other

## 2022-12-20 ENCOUNTER — Encounter: Payer: Self-pay | Admitting: Orthopaedic Surgery

## 2022-12-20 ENCOUNTER — Ambulatory Visit (INDEPENDENT_AMBULATORY_CARE_PROVIDER_SITE_OTHER): Payer: Medicare Other | Admitting: Orthopaedic Surgery

## 2022-12-20 VITALS — Ht 67.0 in | Wt 185.0 lb

## 2022-12-20 DIAGNOSIS — M7061 Trochanteric bursitis, right hip: Secondary | ICD-10-CM

## 2022-12-20 DIAGNOSIS — Z0000 Encounter for general adult medical examination without abnormal findings: Secondary | ICD-10-CM | POA: Diagnosis not present

## 2022-12-20 NOTE — Patient Instructions (Signed)
[name] , Thank you for taking time to come for your Medicare Wellness Visit. I appreciate your ongoing commitment to your health goals. Please review the following plan we discussed and let me know if I can assist you in the future.   These are the goals we discussed:  Goals      Patient Stated     10/29/2019, wants wrist to get better so he can get back to working in the yard     Patient Stated     11/25/2020, stay healthy     Patient Stated     12/07/2021, no goals     Patient Stated     12/20/2022, no goals        This is a list of the screening recommended for you and due dates:  Health Maintenance  Topic Date Due   DTaP/Tdap/Td vaccine (1 - Tdap) Never done   Zoster (Shingles) Vaccine (1 of 2) Never done   Pneumonia Vaccine (1 of 1 - PCV) Never done   COVID-19 Vaccine (5 - 2023-24 season) 06/23/2022   Medicare Annual Wellness Visit  12/21/2023   Flu Shot  Completed   Hepatitis C Screening: USPSTF Recommendation to screen - Ages 18-79 yo.  Completed   HPV Vaccine  Aged Out   Colon Cancer Screening  Discontinued    Advanced directives: Advance directive discussed with you today.   Conditions/risks identified: none  Next appointment: Follow up in one year for your annual wellness visit.   Preventive Care 17 Years and Older, Male  Preventive care refers to lifestyle choices and visits with your health care provider that can promote health and wellness. What does preventive care include? A yearly physical exam. This is also called an annual well check. Dental exams once or twice a year. Routine eye exams. Ask your health care provider how often you should have your eyes checked. Personal lifestyle choices, including: Daily care of your teeth and gums. Regular physical activity. Eating a healthy diet. Avoiding tobacco and drug use. Limiting alcohol use. Practicing safe sex. Taking low doses of aspirin every day. Taking vitamin and mineral supplements as recommended  by your health care provider. What happens during an annual well check? The services and screenings done by your health care provider during your annual well check will depend on your age, overall health, lifestyle risk factors, and family history of disease. Counseling  Your health care provider may ask you questions about your: Alcohol use. Tobacco use. Drug use. Emotional well-being. Home and relationship well-being. Sexual activity. Eating habits. History of falls. Memory and ability to understand (cognition). Work and work Statistician. Screening  You may have the following tests or measurements: Height, weight, and BMI. Blood pressure. Lipid and cholesterol levels. These may be checked every 5 years, or more frequently if you are over 54 years old. Skin check. Lung cancer screening. You may have this screening every year starting at age 37 if you have a 30-pack-year history of smoking and currently smoke or have quit within the past 15 years. Fecal occult blood test (FOBT) of the stool. You may have this test every year starting at age 72. Flexible sigmoidoscopy or colonoscopy. You may have a sigmoidoscopy every 5 years or a colonoscopy every 10 years starting at age 66. Prostate cancer screening. Recommendations will vary depending on your family history and other risks. Hepatitis C blood test. Hepatitis B blood test. Sexually transmitted disease (STD) testing. Diabetes screening. This is done by checking  your blood sugar (glucose) after you have not eaten for a while (fasting). You may have this done every 1-3 years. Abdominal aortic aneurysm (AAA) screening. You may need this if you are a current or former smoker. Osteoporosis. You may be screened starting at age 40 if you are at high risk. Talk with your health care provider about your test results, treatment options, and if necessary, the need for more tests. Vaccines  Your health care provider may recommend certain  vaccines, such as: Influenza vaccine. This is recommended every year. Tetanus, diphtheria, and acellular pertussis (Tdap, Td) vaccine. You may need a Td booster every 10 years. Zoster vaccine. You may need this after age 5. Pneumococcal 13-valent conjugate (PCV13) vaccine. One dose is recommended after age 61. Pneumococcal polysaccharide (PPSV23) vaccine. One dose is recommended after age 71. Talk to your health care provider about which screenings and vaccines you need and how often you need them. This information is not intended to replace advice given to you by your health care provider. Make sure you discuss any questions you have with your health care provider. Document Released: 11/05/2015 Document Revised: 06/28/2016 Document Reviewed: 08/10/2015 Elsevier Interactive Patient Education  2017 [employer] Prevention in the Home Falls can cause injuries. They can happen to people of all ages. There are many things you can do to make your home safe and to help prevent falls. What can I do on the outside of my home? Regularly fix the edges of walkways and driveways and fix any cracks. Remove anything that might make you trip as you walk through a door, such as a raised step or threshold. Trim any bushes or trees on the path to your home. Use bright outdoor lighting. Clear any walking paths of anything that might make someone trip, such as rocks or tools. Regularly check to see if handrails are loose or broken. Make sure that both sides of any steps have handrails. Any raised decks and porches should have guardrails on the edges. Have any leaves, snow, or ice cleared regularly. Use sand or salt on walking paths during winter. Clean up any spills in your garage right away. This includes oil or grease spills. What can I do in the bathroom? Use night lights. Install grab bars by the toilet and in the tub and shower. Do not use towel bars as grab bars. Use non-skid mats or decals in  the tub or shower. If you need to sit down in the shower, use a plastic, non-slip stool. Keep the floor dry. Clean up any water that spills on the floor as soon as it happens. Remove soap buildup in the tub or shower regularly. Attach bath mats securely with double-sided non-slip rug tape. Do not have throw rugs and other things on the floor that can make you trip. What can I do in the bedroom? Use night lights. Make sure that you have a light by your bed that is easy to reach. Do not use any sheets or blankets that are too big for your bed. They should not hang down onto the floor. Have a firm chair that has side arms. You can use this for support while you get dressed. Do not have throw rugs and other things on the floor that can make you trip. What can I do in the kitchen? Clean up any spills right away. Avoid walking on wet floors. Keep items that you use a lot in easy-to-reach places. If you need to reach something  above you, use a strong step stool that has a grab bar. Keep electrical cords out of the way. Do not use floor polish or wax that makes floors slippery. If you must use wax, use non-skid floor wax. Do not have throw rugs and other things on the floor that can make you trip. What can I do with my stairs? Do not leave any items on the stairs. Make sure that there are handrails on both sides of the stairs and use them. Fix handrails that are broken or loose. Make sure that handrails are as long as the stairways. Check any carpeting to make sure that it is firmly attached to the stairs. Fix any carpet that is loose or worn. Avoid having throw rugs at the top or bottom of the stairs. If you do have throw rugs, attach them to the floor with carpet tape. Make sure that you have a light switch at the top of the stairs and the bottom of the stairs. If you do not have them, ask someone to add them for you. What else can I do to help prevent falls? Wear shoes that: Do not have high  heels. Have rubber bottoms. Are comfortable and fit you well. Are closed at the toe. Do not wear sandals. If you use a stepladder: Make sure that it is fully opened. Do not climb a closed stepladder. Make sure that both sides of the stepladder are locked into place. Ask someone to hold it for you, if possible. Clearly mark and make sure that you can see: Any grab bars or handrails. First and last steps. Where the edge of each step is. Use tools that help you move around (mobility aids) if they are needed. These include: Canes. Walkers. Scooters. Crutches. Turn on the lights when you go into a dark area. Replace any light bulbs as soon as they burn out. Set up your furniture so you have a clear path. Avoid moving your furniture around. If any of your floors are uneven, fix them. If there are any pets around you, be aware of where they are. Review your medicines with your doctor. Some medicines can make you feel dizzy. This can increase your chance of falling. Ask your doctor what other things that you can do to help prevent falls. This information is not intended to replace advice given to you by your health care provider. Make sure you discuss any questions you have with your health care provider. Document Released: 08/05/2009 Document Revised: 03/16/2016 Document Reviewed: 11/13/2014 Elsevier Interactive Patient Education  2017 [employer].

## 2022-12-20 NOTE — Progress Notes (Signed)
The patient comes in today after having a steroid injection around his right hip trochanteric area.  He says he is 50% better and doing much better overall.  He is 77 years old.  He is well-known to [place].  He says he does use a hot water bottle over the lateral aspect of his hip and has been working out in the yard.  He does ambulate with a cane as well.  His right hip moves smoothly and fluidly as is his right knee.  There is some pain over the trochanteric area but is not as painful to him as before.  The injection was just about a month ago.  At this point follow-up can be as needed since he is doing better.  Hopefully he can limit his narcotics use as well.  All question concerns were answered and addressed.

## 2022-12-20 NOTE — Progress Notes (Signed)
I connected with  Ryan Craig on 12/20/22 by a audio enabled telemedicine application and verified that I am speaking with the correct person using two identifiers.  Patient Location: Home  Provider Location: Office/Clinic  I discussed the limitations of evaluation and management by telemedicine. The patient expressed understanding and agreed to proceed.  Subjective:   Ryan Craig is a 77 y.o. male who presents for Medicare Annual/Subsequent preventive examination.  Review of Systems     Cardiac Risk Factors include: advanced age (>40mn, >>30women);dyslipidemia;hypertension;male gender     Objective:    Today's Vitals   12/20/22 0827  Weight: 185 lb (83.9 kg)  Height: '5\' 7"'$  (1.702 m)   Body mass index is 28.98 kg/m.     12/20/2022    8:32 AM 01/02/2022   11:46 AM 12/07/2021    8:33 AM 03/28/2021    8:38 AM 11/25/2020    9:31 AM 10/29/2019    9:20 AM 01/18/2015    9:19 AM  Advanced Directives  Does Patient Have a Medical Advance Directive? No No Yes Yes Yes Yes Yes  Type of Advance Directive   [place]  Does patient want to make changes to medical advance directive?    No - Patient declined     Copy of [place]?    No - copy requested No - copy requested      Current Medications (verified) Outpatient Encounter Medications as of 12/20/2022  Medication Sig   acetaminophen (TYLENOL) 500 MG tablet Take 500 mg by mouth every 6 (six) hours as needed.   amLODipine (NORVASC) 10 MG tablet TAKE 1 TABLET DAILY   aspirin EC 81 MG tablet Take 81 mg by mouth daily.   BLACK CURRANT SEED OIL PO Take by mouth.   Cholecalciferol (VITAMIN D-3 PO) Take 2 tablets by mouth daily.    HYDROcodone-acetaminophen (NORCO/VICODIN) 5-325 MG tablet Take 1 tablet by mouth 2 (two) times daily as needed for moderate pain.   lisinopril  (ZESTRIL) 10 MG tablet TAKE 1 TABLET BY MOUTH EVERY DAY   metoprolol succinate (TOPROL-XL) 25 MG 24 hr tablet TAKE 1 TABLET (25 MG TOTAL) BY MOUTH DAILY.   rosuvastatin (CRESTOR) 40 MG tablet TAKE 1 TABLET BY MOUTH EVERY DAY   temazepam (RESTORIL) 15 MG capsule Take 1 capsule (15 mg total) by mouth at bedtime as needed for sleep. (Patient not taking: Reported on 12/20/2022)   triamcinolone cream (KENALOG) 0.1 % APPLY TOPICALLY A THIN LAYER TO THE AFFECTED AREA(S) TWICE A DAY   amLODipine (NORVASC) 10 MG tablet Take 1 tablet (10 mg total) by mouth daily for 7 doses.   diazepam (VALIUM) 5 MG tablet TAKE ONE TAB ONE HOUR PRIOR TO INJECTION REPEAT AS NEEDED #2 . ZERO REFILLS (Patient not taking: Reported on 12/20/2022)   diclofenac Sodium (VOLTAREN) 1 % GEL Apply 2 g topically 4 (four) times daily. (Patient not taking: Reported on 12/20/2022)   No facility-administered encounter medications on file as of 12/20/2022.    Allergies (verified) Contrast media [iodinated contrast media] and Latex   History: Past Medical History:  Diagnosis Date   Anemia    no on any meds   Arthritis    Bifascicular block    Chronic back pain    deteriorating   Hyperlipidemia    Hypertension    Joint pain    Joint swelling  Mild CAD    Myocarditis (HCC)    PAT (paroxysmal atrial tachycardia)    Premature atrial contractions    PVC's (premature ventricular contractions)    Thoracic aortic aneurysm (TAA) (HCC)    Past Surgical History:  Procedure Laterality Date   arthroscopic knee surgery Right    COLONOSCOPY     INGUINAL HERNIA REPAIR Bilateral    x 4   JOINT REPLACEMENT Right 6 yrs ago   knee   KNEE ARTHROSCOPY Left 02/24/2014   Procedure: LEFT KNEE ARTHROSCOPY WITH PARTIAL MEDIAL MENISCECTOMY;  Surgeon: Mcarthur Rossetti, MD;  Location: [facility];  Service: Orthopedics;  Laterality: Left;   LEFT HEART CATH AND CORONARY ANGIOGRAPHY N/A 07/22/2020   Procedure: LEFT HEART CATH AND CORONARY ANGIOGRAPHY;   Surgeon: Burnell Blanks, MD;  Location: [facility];  Service: Cardiovascular;  Laterality: N/A;   TOTAL KNEE ARTHROPLASTY Left 12/22/2014   Procedure: LEFT TOTAL KNEE ARTHROPLASTY;  Surgeon: Mcarthur Rossetti, MD;  Location: [facility];  Service: Orthopedics;  Laterality: Left;   Family History  Problem Relation Age of Onset   Heart attack Mother    Cancer Maternal Aunt    Cancer Paternal Aunt    Cancer Cousin    Social History   Socioeconomic History   Marital status: Married    Spouse name: Not on file   Number of children: Not on file   Years of education: Not on file   Highest education level: Not on file  Occupational History   Occupation: retired  Tobacco Use   Smoking status: Former    Passive exposure: Past   Smokeless tobacco: Former   Tobacco comments:    quit smoking about 6-7ytrs ago  Scientific laboratory technician Use: Never used  Substance and Sexual Activity   Alcohol use: No    Comment: drank over 40 years ago   Drug use: Yes    Types: Hydrocodone   Sexual activity: Not Currently  Other Topics Concern   Not on file  Social History Narrative   Not on file   Social Determinants of Health   Financial Resource Strain: [place]  (12/20/2022)   Overall Financial Resource Strain (CARDIA)    Difficulty of Paying Living Expenses: Not hard at all  Food Insecurity: No Food Insecurity (12/20/2022)   Hunger Vital Sign    Worried About Running Out of Food in the Last Year: Never true    [place] in the Last Year: Never true  Transportation Needs: No Transportation Needs (12/20/2022)   PRAPARE - Hydrologist (Medical): No    Lack of Transportation (Non-Medical): No  Physical Activity: Inactive (12/20/2022)   Exercise Vital Sign    Days of Exercise per Week: 0 days    Minutes of Exercise per Session: 0 min  Stress: No Stress Concern Present (12/20/2022)   [place]    Feeling of Stress : Not at all  Social Connections: Not on file    Tobacco Counseling Counseling given: Not Answered Tobacco comments: quit smoking about 6-7ytrs ago   Clinical Intake:  Pre-visit preparation completed: Yes  Pain : No/denies pain     Nutritional Status: BMI 25 -29 Overweight Nutritional Risks: None Diabetes: No  How often do you need to have someone help you when you read instructions, pamphlets, or other written materials from your doctor or pharmacy?: 1 - Never  Diabetic? no  Interpreter Needed?: No  Information entered by :: NAllen LPN   Activities of Daily Living    12/20/2022    8:32 AM  In your present state of health, do you have any difficulty performing the following activities:  Hearing? 0  Vision? 0  Difficulty concentrating or making decisions? 0  Walking or climbing stairs? 0  Dressing or bathing? 0  Doing errands, shopping? 0  Preparing Food and eating ? N  Using the Toilet? N  In the past six months, have you accidently leaked urine? N  Do you have problems with loss of bowel control? N  Managing your Medications? N  Managing your Finances? N  Housekeeping or managing your Housekeeping? N    Patient Care Team: [name], FNP as PCP - General (General Practice) Jerline Pain, MD as PCP - Cardiology (Cardiology)  Indicate any recent Medical Services you may have received from other than [facility] providers in the past year (date may be approximate).     Assessment:   This is a routine wellness examination for Aadyn.  Hearing/Vision screen Vision Screening - Comments:: No regular eye exams  Dietary issues and exercise activities discussed: Current Exercise Habits: The patient does not participate in regular exercise at present   Goals Addressed             This Visit's Progress    Patient Stated       12/20/2022, no goals       Depression Screen    12/20/2022    8:32 AM 12/07/2021    8:33 AM  11/25/2020    9:33 AM 10/29/2019    9:22 AM 01/22/2019    9:15 AM 10/22/2018    9:09 AM 09/27/2018    9:06 AM  PHQ 2/9 Scores  PHQ - 2 Score 0 0 0 0 0 0 0  PHQ- 9 Score    0       Fall Risk    12/20/2022    8:32 AM 12/07/2021    8:33 AM 11/25/2020    9:32 AM 10/29/2019    9:21 AM 01/22/2019    9:15 AM  Fall Risk   Falls in the past year? 0 0 1 1 0  Comment   fell getting off the bike, fell in the garden tripped over grass   Number falls in past yr: 0  1    Injury with Fall? 0  0 1   Comment    broke wrist   Risk for fall due to : Medication side effect Medication side effect;Impaired mobility Medication side effect History of fall(s);Medication side effect   Follow up Falls prevention discussed;Education provided;Falls evaluation completed Falls evaluation completed;Education provided;Falls prevention discussed Falls evaluation completed;Education provided;Falls prevention discussed Falls evaluation completed;Education provided;Falls prevention discussed     FALL RISK PREVENTION PERTAINING TO THE HOME:  Any stairs in or around the home? No  If so, are there any without handrails? N/a Home free of loose throw rugs in walkways, pet beds, electrical cords, etc? Yes  Adequate lighting in your home to reduce risk of falls? Yes   ASSISTIVE DEVICES UTILIZED TO PREVENT FALLS:  Life alert? No  Use of a cane, walker or w/c? Yes  Grab bars in the bathroom? No  Shower chair or bench in shower? Yes  Elevated toilet seat or a handicapped toilet? No   TIMED UP AND GO:  Was the test performed? No .      Cognitive Function:  10/22/2018    9:11 AM  MMSE - Mini Mental State Exam  Orientation to time 5  Orientation to Place 5  Registration 3  Attention/ Calculation 5  Language- name 2 objects 2  Language- read & follow direction 1  Write a sentence 1  Copy design 1        12/20/2022    8:33 AM 08/28/2022    8:52 AM 12/07/2021    8:35 AM 11/25/2020    9:35 AM 10/29/2019    9:25 AM   6CIT Screen  What Year? 0 points 0 points 0 points 0 points 0 points  What month? 0 points 0 points 0 points 0 points 0 points  What time? 0 points 0 points 0 points 0 points 0 points  Count back from 20 0 points 0 points 0 points 0 points 0 points  Months in reverse 2 points 4 points 2 points 4 points 2 points  Repeat phrase 0 points 8 points 8 points 4 points 0 points  Total Score 2 points 12 points 10 points 8 points 2 points    Immunizations Immunization History  Administered Date(s) Administered   Fluad Quad(high Dose 65+) 09/14/2020, 06/28/2021, 08/03/2022   Influenza, High Dose Seasonal PF 09/27/2018, 08/05/2019   PFIZER(Purple Top)SARS-COV-2 Vaccination 12/17/2019, 01/14/2020, 08/30/2020, 02/07/2021    TDAP status: Due, Education has been provided regarding the importance of this vaccine. Advised may receive this vaccine at local pharmacy or Health Dept. Aware to provide a copy of the vaccination record if obtained from local pharmacy or Health Dept. Verbalized acceptance and understanding.  Flu Vaccine status: Up to date  Pneumococcal vaccine status: Due, Education has been provided regarding the importance of this vaccine. Advised may receive this vaccine at local pharmacy or Health Dept. Aware to provide a copy of the vaccination record if obtained from local pharmacy or Health Dept. Verbalized acceptance and understanding.  Covid-19 vaccine status: Completed vaccines  Qualifies for Shingles Vaccine? Yes   Zostavax completed No   Shingrix Completed?: No.    Education has been provided regarding the importance of this vaccine. Patient has been advised to call insurance company to determine out of pocket expense if they have not yet received this vaccine. Advised may also receive vaccine at local pharmacy or Health Dept. Verbalized acceptance and understanding.  Screening Tests Health Maintenance  Topic Date Due   DTaP/Tdap/Td (1 - Tdap) Never done   Zoster Vaccines-  Shingrix (1 of 2) Never done   Pneumonia Vaccine 58+ Years old (1 of 1 - PCV) Never done   COVID-19 Vaccine (5 - 2023-24 season) 06/23/2022   Medicare Annual Wellness (AWV)  12/07/2022   INFLUENZA VACCINE  Completed   Hepatitis C Screening  Completed   HPV VACCINES  Aged Out   COLONOSCOPY (Pts 45-44yr Insurance coverage will need to be confirmed)  Discontinued    Health Maintenance  Health Maintenance Due  Topic Date Due   DTaP/Tdap/Td (1 - Tdap) Never done   Zoster Vaccines- Shingrix (1 of 2) Never done   Pneumonia Vaccine 79 Years old (1 of 1 - PCV) Never done   COVID-19 Vaccine (5 - 2023-24 season) 06/23/2022   Medicare Annual Wellness (AWV)  12/07/2022    Colorectal cancer screening: No longer required.   Lung Cancer Screening: (Low Dose CT Chest recommended if Age 79-80years, 30 pack-year currently smoking OR have quit w/in 15years.) does not qualify.   Lung Cancer Screening Referral: no  Additional Screening:  Hepatitis  C Screening: does qualify; Completed 09/04/2017  Vision Screening: Recommended annual ophthalmology exams for early detection of glaucoma and other disorders of the eye. Is the patient up to date with their annual eye exam?  No  Who is the provider or what is the name of the office in which the patient attends annual eye exams? none If pt is not established with a provider, would they like to be referred to a provider to establish care? No .   Dental Screening: Recommended annual dental exams for proper oral hygiene  Community Resource Referral / Chronic Care Management: CRR required this visit?  No   CCM required this visit?  No      Plan:     I have personally reviewed and noted the following in the patient's chart:   Medical and social history Use of alcohol, tobacco or illicit drugs  Current medications and supplements including opioid prescriptions. Patient is currently taking opioid prescriptions. Information provided to patient  regarding non-opioid alternatives. Patient advised to discuss non-opioid treatment plan with their provider. Functional ability and status Nutritional status Physical activity Advanced directives List of other physicians Hospitalizations, surgeries, and ER visits in previous 12 months Vitals Screenings to include cognitive, depression, and falls Referrals and appointments  In addition, I have reviewed and discussed with patient certain preventive protocols, quality metrics, and best practice recommendations. A written personalized care plan for preventive services as well as general preventive health recommendations were provided to patient.     Kellie Simmering, LPN   [id number]   Nurse Notes: none  Due to this being a virtual visit, the after visit summary with patients personalized plan was offered to patient via mail or my-chart.  to pick up at office at next visit

## 2022-12-27 ENCOUNTER — Encounter: Payer: Self-pay | Admitting: Nurse Practitioner

## 2022-12-27 ENCOUNTER — Ambulatory Visit (INDEPENDENT_AMBULATORY_CARE_PROVIDER_SITE_OTHER): Payer: Medicare Other | Admitting: Nurse Practitioner

## 2022-12-27 VITALS — BP 138/68 | HR 66 | Temp 97.9°F | Ht 67.0 in | Wt 195.0 lb

## 2022-12-27 DIAGNOSIS — E7800 Pure hypercholesterolemia, unspecified: Secondary | ICD-10-CM

## 2022-12-27 DIAGNOSIS — Z2821 Immunization not carried out because of patient refusal: Secondary | ICD-10-CM

## 2022-12-27 DIAGNOSIS — I712 Thoracic aortic aneurysm, without rupture, unspecified: Secondary | ICD-10-CM | POA: Diagnosis not present

## 2022-12-27 DIAGNOSIS — R7989 Other specified abnormal findings of blood chemistry: Secondary | ICD-10-CM

## 2022-12-27 DIAGNOSIS — I10 Essential (primary) hypertension: Secondary | ICD-10-CM | POA: Diagnosis not present

## 2022-12-27 MED ORDER — VITAMIN B-12 100 MCG PO TABS: 100.0000 ug | ORAL_TABLET | Freq: Every day | ORAL | 1 refills | Status: DC

## 2022-12-27 NOTE — Progress Notes (Signed)
I,Sheena H Holbrook,acting as a Education administrator for [name], FNP.,have documented all relevant documentation on the behalf of [name], FNP,as directed by  [name], FNP while in the presence of [name], [place].    Subjective:     Patient ID: Ryan Craig , male    DOB: [birth year] , 77 y.o.   MRN: [id number]   Chief Complaint  Patient presents with   Hypertension    HPI  Patient presents today for hypertension follow up. States "I don't eat that much". Continues to exercise by cutting grass. He feels his knees and his back are doing better.  Wt Readings from Last 3 Encounters: 12/27/22 : 195 lb (88.5 kg) 12/20/22 : 185 lb (83.9 kg) 08/28/22 : 199 lb (90.3 kg)    Hypertension This is a chronic problem. The current episode started more than 1 year ago. The problem is unchanged. The problem is controlled. Pertinent negatives include no chest pain, headaches, palpitations or shortness of breath. There are no associated agents to hypertension. Risk factors for coronary artery disease include dyslipidemia, obesity and male gender. Past treatments include ACE inhibitors and calcium channel blockers. The current treatment provides significant improvement. There are no compliance problems.  There is no history of angina. There is no history of chronic renal disease.     Past Medical History:  Diagnosis Date   Anemia    no on any meds   Arthritis    Bifascicular block    Chronic back pain    deteriorating   Hyperlipidemia    Hypertension    Joint pain    Joint swelling    Mild CAD    Myocarditis (HCC)    PAT (paroxysmal atrial tachycardia)    Premature atrial contractions    PVC's (premature ventricular contractions)    Thoracic aortic aneurysm (TAA) (HCC)      Family History  Problem Relation Age of Onset   Heart attack Mother    Cancer Maternal Aunt    Cancer Paternal Aunt    Cancer Cousin      Current Outpatient Medications:    acetaminophen (TYLENOL) 500 MG  tablet, Take 500 mg by mouth every 6 (six) hours as needed., Disp: , Rfl:    amLODipine (NORVASC) 10 MG tablet, TAKE 1 TABLET DAILY, Disp: 90 tablet, Rfl: 3   aspirin EC 81 MG tablet, Take 81 mg by mouth daily., Disp: , Rfl:    BLACK CURRANT SEED OIL PO, Take by mouth., Disp: , Rfl:    Cholecalciferol (VITAMIN D-3 PO), Take 2 tablets by mouth daily. , Disp: , Rfl:    HYDROcodone-acetaminophen (NORCO/VICODIN) 5-325 MG tablet, Take 1 tablet by mouth 2 (two) times daily as needed for moderate pain., Disp: 40 tablet, Rfl: 0   lisinopril (ZESTRIL) 10 MG tablet, TAKE 1 TABLET BY MOUTH EVERY DAY, Disp: 90 tablet, Rfl: 1   metoprolol succinate (TOPROL-XL) 25 MG 24 hr tablet, TAKE 1 TABLET (25 MG TOTAL) BY MOUTH DAILY., Disp: 90 tablet, Rfl: 3   rosuvastatin (CRESTOR) 40 MG tablet, TAKE 1 TABLET BY MOUTH EVERY DAY, Disp: 90 tablet, Rfl: 3   triamcinolone cream (KENALOG) 0.1 %, APPLY TOPICALLY A THIN LAYER TO THE AFFECTED AREA(S) TWICE A DAY, Disp: 454 g, Rfl: 0   vitamin B-12 (CYANOCOBALAMIN) 100 MCG tablet, Take 1 tablet (100 mcg total) by mouth daily., Disp: 90 tablet, Rfl: 1   amLODipine (NORVASC) 10 MG tablet, Take 1 tablet (10 mg total) by mouth daily for 7 doses.,  Disp: 7 tablet, Rfl: 0   Allergies  Allergen Reactions   Contrast Media [Iodinated Contrast Media] Anaphylaxis   Latex Rash     Review of Systems  Constitutional: Negative.   Respiratory: Negative.  Negative for shortness of breath.   Cardiovascular: Negative.  Negative for chest pain, palpitations and leg swelling.  Gastrointestinal: Negative.   Musculoskeletal:        Right knee pain radiating up to his back which is worse at night. He has bilateral knee replacement.   Neurological: Negative.  Negative for headaches.  Psychiatric/Behavioral: Negative.    All other systems reviewed and are negative.    Today's Vitals   12/27/22 0827  BP: 138/68  Pulse: 66  Temp: 97.9 F (36.6 C)  TempSrc: Oral  SpO2: 97%  Weight: 195 lb  (88.5 kg)  Height: '5\' 7"'$  (1.702 m)   Body mass index is 30.54 kg/m.   Objective:  Physical Exam Vitals reviewed.  Constitutional:      General: He is not in acute distress.    Appearance: Normal appearance.  Cardiovascular:     Pulses: Normal pulses.     Heart sounds: Normal heart sounds. No murmur heard. Pulmonary:     Effort: Pulmonary effort is normal. No respiratory distress.     Breath sounds: Normal breath sounds. No wheezing.  Skin:    General: Skin is warm.     Capillary Refill: Capillary refill takes less than 2 seconds.  Neurological:     General: No focal deficit present.     Mental Status: He is alert and oriented to person, place, and time.     Cranial Nerves: No cranial nerve deficit.     Motor: No weakness.  Psychiatric:        Attention and Perception: Attention and perception normal.        Mood and Affect: Mood and affect normal.        Speech: Speech normal.        Behavior: Behavior normal.        Cognition and Memory: He exhibits impaired recent memory.         Assessment And Plan:     1. Essential hypertension Comments: Blood pressure is fairly controlled. Continue current medications and focus on life style modifications  2. Pure hypercholesterolemia Comments: Stable. Continue current medications  3. Thoracic aortic aneurysm without rupture, unspecified part ([facility]) Comments: Continue f/u with Cardiology  4. Low vitamin B12 level Comments: He has a low normal vitamin B12, will send Rx for vitamin B12 to see if helps with his memory - vitamin B-12 (CYANOCOBALAMIN) 100 MCG tablet; Take 1 tablet (100 mcg total) by mouth daily.  Dispense: 90 tablet; Refill: 1  5. Immunization declined Comments: Declined pneumonia vaccine  6. Herpes zoster vaccination declined Declines shingrix, educated on disease process and is aware if he changes his mind to notify office      Patient was given opportunity to ask questions. Patient verbalized  understanding of the plan and was able to repeat key elements of the plan. All questions were answered to their satisfaction.  [name], FNP   I, [name], FNP, have reviewed all documentation for this visit. The documentation on 12/27/22 for the exam, diagnosis, procedures, and orders are all accurate and complete.   IF YOU HAVE BEEN REFERRED TO A SPECIALIST, IT MAY TAKE 1-2 WEEKS TO SCHEDULE/PROCESS THE REFERRAL. IF YOU HAVE NOT HEARD FROM US/SPECIALIST IN TWO WEEKS, PLEASE GIVE [place] A CALL  AT [phone].   THE PATIENT IS ENCOURAGED TO PRACTICE SOCIAL DISTANCING DUE TO THE COVID-19 PANDEMIC.

## 2022-12-28 ENCOUNTER — Telehealth: Payer: Self-pay | Admitting: Orthopaedic Surgery

## 2022-12-28 ENCOUNTER — Other Ambulatory Visit: Payer: Self-pay | Admitting: Orthopaedic Surgery

## 2022-12-28 MED ORDER — HYDROCODONE-ACETAMINOPHEN 5-325 MG PO TABS: 1.0000 | ORAL_TABLET | Freq: Two times a day (BID) | ORAL | 0 refills | Status: DC | PRN

## 2022-12-28 NOTE — Telephone Encounter (Signed)
Patient requesting one more refill on hydrocodone please advise

## 2023-01-31 ENCOUNTER — Other Ambulatory Visit: Payer: Self-pay | Admitting: Orthopaedic Surgery

## 2023-01-31 MED ORDER — HYDROCODONE-ACETAMINOPHEN 5-325 MG PO TABS: 1.0000 | ORAL_TABLET | Freq: Two times a day (BID) | ORAL | 0 refills | Status: DC | PRN

## 2023-02-16 ENCOUNTER — Other Ambulatory Visit: Payer: Self-pay | Admitting: Cardiology

## 2023-02-21 ENCOUNTER — Ambulatory Visit (HOSPITAL_COMMUNITY): Payer: Medicare Other | Attending: Physician Assistant

## 2023-02-21 DIAGNOSIS — E785 Hyperlipidemia, unspecified: Secondary | ICD-10-CM | POA: Diagnosis not present

## 2023-02-21 DIAGNOSIS — I340 Nonrheumatic mitral (valve) insufficiency: Secondary | ICD-10-CM | POA: Diagnosis not present

## 2023-02-21 DIAGNOSIS — I452 Bifascicular block: Secondary | ICD-10-CM | POA: Diagnosis not present

## 2023-02-21 DIAGNOSIS — I4719 Other supraventricular tachycardia: Secondary | ICD-10-CM

## 2023-02-21 DIAGNOSIS — I493 Ventricular premature depolarization: Secondary | ICD-10-CM | POA: Diagnosis not present

## 2023-02-21 DIAGNOSIS — I491 Atrial premature depolarization: Secondary | ICD-10-CM | POA: Diagnosis not present

## 2023-02-21 DIAGNOSIS — I2510 Atherosclerotic heart disease of native coronary artery without angina pectoris: Secondary | ICD-10-CM

## 2023-02-21 DIAGNOSIS — I351 Nonrheumatic aortic (valve) insufficiency: Secondary | ICD-10-CM

## 2023-02-21 DIAGNOSIS — Z8679 Personal history of other diseases of the circulatory system: Secondary | ICD-10-CM | POA: Diagnosis not present

## 2023-02-21 DIAGNOSIS — R0789 Other chest pain: Secondary | ICD-10-CM | POA: Insufficient documentation

## 2023-02-21 DIAGNOSIS — I712 Thoracic aortic aneurysm, without rupture, unspecified: Secondary | ICD-10-CM | POA: Diagnosis not present

## 2023-02-21 LAB — ECHOCARDIOGRAM COMPLETE
Area-P 1/2: 3.02 cm2
P 1/2 time: 430 msec
S' Lateral: 3.1 cm

## 2023-03-02 ENCOUNTER — Other Ambulatory Visit: Payer: Self-pay | Admitting: Cardiology

## 2023-03-02 ENCOUNTER — Other Ambulatory Visit: Payer: Self-pay | Admitting: Nurse Practitioner

## 2023-03-05 ENCOUNTER — Other Ambulatory Visit: Payer: Self-pay

## 2023-03-05 MED ORDER — AMLODIPINE BESYLATE 10 MG PO TABS: 10.0000 mg | ORAL_TABLET | Freq: Every day | ORAL | 3 refills | Status: DC

## 2023-03-07 ENCOUNTER — Other Ambulatory Visit: Payer: Self-pay

## 2023-03-07 ENCOUNTER — Telehealth: Payer: Self-pay | Admitting: Cardiology

## 2023-03-07 DIAGNOSIS — Z01812 Encounter for preprocedural laboratory examination: Secondary | ICD-10-CM

## 2023-03-07 DIAGNOSIS — I712 Thoracic aortic aneurysm, without rupture, unspecified: Secondary | ICD-10-CM

## 2023-03-07 MED ORDER — AMLODIPINE BESYLATE 10 MG PO TABS: 10.0000 mg | ORAL_TABLET | Freq: Every day | ORAL | 3 refills | Status: DC

## 2023-03-07 NOTE — Telephone Encounter (Signed)
Patient called and said that Dr. Anne Fu was supposed to put in an order for him to have an MRI but I'm not showing one in the system

## 2023-03-07 NOTE — Telephone Encounter (Signed)
Michele Rockers last saw pt 08/2020. Pt then saw Robin Searing 02/14/22 and on 04/14/22 he saw Ronie Spies. Per her office visit note: 4. Mild MR, mild-moderate AI - generally asymptomatic from this standpoint. Will plan to repeat echo in 02/2023 in anticipation of repeat annual visit 03/2023 (primarily being obtained at 1 year interval due to following his aortic insufficiency in the presence of TAA).   5. Thoracic aortic aneurysm - reviewed aneurysm precautions including monitoring, family screening, avoidance of fluoroquinolones and heavy lifting. Will plan to repeat annual MRA in 02/2023 before next yearly visit. MRA chosen over CT due to contrast allergy.    Disposition: F/u with Dr. Anne Fu or myself in 03/2023 after echo/MRA.  Pt had echocardiogram 02/21/2023 but no indication MRA chest was ever ordered.  Will verify if testing is still required.  Of note, if required pt does have a contrast allergy and will need pre-med.

## 2023-03-08 ENCOUNTER — Telehealth: Payer: Self-pay | Admitting: *Deleted

## 2023-03-08 MED ORDER — PREDNISONE 50 MG PO TABS: ORAL_TABLET | ORAL | 0 refills | Status: DC

## 2023-03-08 NOTE — Telephone Encounter (Signed)
    03/08/23  8:58 AM Note Orders reviewed by Ronie Spies, PA.  She advises pt does not need pre-med prior to the MRA-chest as long as he did not have a reaction with the last MRA.  Contacted pt who reports no reaction to prior MRA.  Advised he does not need prednisone prior to testing and to not pick it up from the pharmacy.  He states understanding.  Called [facility] and left message to cancel this RX.

## 2023-03-08 NOTE — Telephone Encounter (Signed)
Pt is aware order for MRA-chest has been placed and he will be contacted to be scheduled at [facility].  Order placed for BMP and pt will come into the office on Tuesday 5/21 to have it drawn.  Order for pre-med - prednisone sent into [facility] [address].  Pt states he is aware how to take it.  He will call back if any questions or concerns prior to testing.

## 2023-03-08 NOTE — Telephone Encounter (Signed)
Orders reviewed by Ronie Spies, PA.  She advises pt does not need pre-med prior to the MRA-chest as long as he did not have a reaction with the last MRA.  Contacted pt who reports no reaction to prior MRA.  Advised he does not need prednisone prior to testing and to not pick it up from the pharmacy.  He states understanding.  Called [facility] [address] and left message to cancel this RX.

## 2023-03-08 NOTE — Telephone Encounter (Signed)
No pre-med needed - he has allergy to iodinated contrast dye which is why MRA was chosen (uses gadolinium instead). Did not require pre-med for previous MRA either. Can verify that he did not have a reaction to the previous MRA as well but if he did not, then no need for pre-med. Thanks!

## 2023-03-08 NOTE — Telephone Encounter (Signed)
Yes, still needs study. Order was in but is listed as cancelled by "[employer]" due to expiration on 02/26/23. (Able to view under Imaging -> uncheck "Hide canceled"). Please re-order, dx: thoracic aortic aneurysm without rupture. Needs updated BMET beforehand. Thank you for checking!

## 2023-03-13 ENCOUNTER — Ambulatory Visit: Payer: Medicare Other | Attending: Physician Assistant

## 2023-03-13 DIAGNOSIS — Z01812 Encounter for preprocedural laboratory examination: Secondary | ICD-10-CM

## 2023-03-13 DIAGNOSIS — I712 Thoracic aortic aneurysm, without rupture, unspecified: Secondary | ICD-10-CM | POA: Diagnosis not present

## 2023-03-13 LAB — BASIC METABOLIC PANEL
BUN/Creatinine Ratio: 11 (ref 10–24)
BUN: 10 mg/dL (ref 8–27)
CO2: 24 mmol/L (ref 20–29)
Calcium: 9.6 mg/dL (ref 8.6–10.2)
Chloride: 104 mmol/L (ref 96–106)
Creatinine, Ser: 0.87 mg/dL (ref 0.76–1.27)
Glucose: 101 mg/dL — ABNORMAL HIGH (ref 70–99)
Potassium: 4.9 mmol/L (ref 3.5–5.2)
Sodium: 141 mmol/L (ref 134–144)
eGFR: 88 mL/min/{1.73_m2} (ref >59)

## 2023-03-14 ENCOUNTER — Telehealth: Payer: Self-pay

## 2023-03-14 ENCOUNTER — Other Ambulatory Visit (INDEPENDENT_AMBULATORY_CARE_PROVIDER_SITE_OTHER): Payer: Medicare Other

## 2023-03-14 ENCOUNTER — Encounter: Payer: Self-pay | Admitting: Orthopaedic Surgery

## 2023-03-14 ENCOUNTER — Ambulatory Visit (INDEPENDENT_AMBULATORY_CARE_PROVIDER_SITE_OTHER): Payer: Medicare Other | Admitting: Orthopaedic Surgery

## 2023-03-14 DIAGNOSIS — M79674 Pain in right toe(s): Secondary | ICD-10-CM

## 2023-03-14 NOTE — Telephone Encounter (Signed)
Left VM to return call for lab results.  

## 2023-03-14 NOTE — Telephone Encounter (Signed)
-----   Message from Dayna N Dunn, PA-C sent at 03/14/2023  8:29 AM EDT ----- Pre MRA BMET is normal except glucose just barely a few pts above normal, continue plan 

## 2023-03-14 NOTE — Telephone Encounter (Signed)
-----   Message from Laurann Montana, [place] sent at 03/14/2023  8:29 AM EDT ----- Pre MRA BMET is normal except glucose just barely a few pts above normal, continue plan

## 2023-03-14 NOTE — Telephone Encounter (Signed)
Reviewed lab results with patient and confirmed appointment for MRA on 03/26/23. Patient verbalized understanding and had no questions.

## 2023-03-14 NOTE — Progress Notes (Signed)
The patient is a 77 year old patient of ours who injured his right foot third toe about a week ago when he stubbed that toe in the early morning hours when he was going to the bathroom.  He has had persistent pain in the third toe since then and swelling.  On exam there is minimal swelling of the third toe on the right foot but it is tender.  Clinically it is well aligned and there is no instability that I can feel with examining the toe.  3 views of the right third toe show no obvious fracture, dislocation or malalignment.  I gave him reassurance that there was no fracture that we could see.  The treatment is just conservative in terms of time and weightbearing as tolerated.  This should improve as time goes by.  Has been already been walking on it for a week and again the x-rays were normal.

## 2023-03-26 ENCOUNTER — Ambulatory Visit (HOSPITAL_COMMUNITY)
Admission: RE | Admit: 2023-03-26 | Discharge: 2023-03-26 | Disposition: A | Discharge status: Home / Self Care | Payer: Medicare Other | Source: Ambulatory Visit | Attending: Physician Assistant | Admitting: Physician Assistant

## 2023-03-26 DIAGNOSIS — I712 Thoracic aortic aneurysm, without rupture, unspecified: Secondary | ICD-10-CM | POA: Insufficient documentation

## 2023-03-26 MED ORDER — GADOBUTROL 1 MMOL/ML IV SOLN
9.0000 mL | Freq: Once | INTRAVENOUS | Status: AC | PRN
  Administered 2023-03-26: 9 mL via INTRAVENOUS

## 2023-04-17 ENCOUNTER — Ambulatory Visit: Payer: Medicare Other | Attending: Cardiology | Admitting: Cardiology

## 2023-04-17 ENCOUNTER — Encounter: Payer: Self-pay | Admitting: Cardiology

## 2023-04-17 VITALS — BP 122/60 | HR 78 | Ht 67.0 in | Wt 187.8 lb

## 2023-04-17 DIAGNOSIS — E782 Mixed hyperlipidemia: Secondary | ICD-10-CM | POA: Diagnosis not present

## 2023-04-17 DIAGNOSIS — Z8679 Personal history of other diseases of the circulatory system: Secondary | ICD-10-CM | POA: Insufficient documentation

## 2023-04-17 DIAGNOSIS — I2510 Atherosclerotic heart disease of native coronary artery without angina pectoris: Secondary | ICD-10-CM | POA: Diagnosis not present

## 2023-04-17 DIAGNOSIS — Z01812 Encounter for preprocedural laboratory examination: Secondary | ICD-10-CM | POA: Insufficient documentation

## 2023-04-17 NOTE — Progress Notes (Signed)
  Cardiology Office Note:  .   Date:  04/17/2023  ID:  Ryan Craig, DOB 11/06/1945, MRN [id number] PCP: [name], FNP  [facility] Providers Cardiologist:  Donato Schultz, MD    History of Present Illness: .   Ryan Craig is a 77 y.o. male here for follow-up nonobstructive CAD on prior heart catheterization 2021 20% lesion, cardiac MRI myocarditis.  Beta-blockers were started aorta was dilated 45 mm sinus of Valsalva normal ascending 38.  Possible old iodinated contrast allergy with hives or itching.  Overall been doing quite well.  Minimal shortness of breath with activity.  Minimal sharp fleeting chest pain.  No syncope.  Compliant with his medications.  Wife present to help with historical items.  ROS: Mild SOB. Minimal sharp CP fleeting. No syncope.   Studies Reviewed: Marland Kitchen   EKG Interpretation  Date/Time:  Tuesday April 17 2023 08:48:28 EDT Ventricular Rate:  78 PR Interval:  198 QRS Duration: 138 QT Interval:  434 QTC Calculation: 494 R Axis:   -58 Text Interpretation: Sinus rhythm with Premature atrial complexes Right bundle branch block Left anterior fascicular block Left ventricular hypertrophy with repolarization abnormality ( R in aVL ) When compared with ECG of 23-Jul-2020 05:04, Premature atrial complexes are now Present Confirmed by Donato Schultz ([id number]) on 04/17/2023 9:13:09 AM     Risk Assessment/Calculations:            Physical Exam:   VS:  BP 122/60   Pulse 78   Ht 5\' 7"  (1.702 m)   Wt 187 lb 12.8 oz (85.2 kg)   SpO2 96%   BMI 29.41 kg/m    Wt Readings from Last 3 Encounters:  04/17/23 187 lb 12.8 oz (85.2 kg)  12/27/22 195 lb (88.5 kg)  12/20/22 185 lb (83.9 kg)    GEN: Well nourished, well developed in no acute distress NECK: No JVD; No carotid bruits CARDIAC: RRR, no murmurs, rubs, gallops RESPIRATORY:  Clear to auscultation without rales, wheezing or rhonchi  ABDOMEN: Soft, non-tender, non-distended EXTREMITIES:  No edema; No deformity    ASSESSMENT AND PLAN: .   Myocarditis-resolved - MRI.  Stable.  No further symptoms.  No chest pain.  No arrhythmias.  Rare AC  Dilated sinus of Valsalva - Most recent MRI on 03/26/2023 showed 4 cm.  Relatively normal.  Ascending aorta was normal.  Reassurance.  Nonobstructive CAD - 20% lesion in 2021 cath.  Continue with medication management.  Mild AR/ trivial MR - Should be of no clinical significance  Hyperlipidemia - On Crestor 40 mg, LDL 55 excellent.  Hemoglobin A1c 5.7.  Ambulates with a cane.  Continue to encourage activity.      Dispo: 1 year follow-up with APP, next year with me  Signed, Donato Schultz, MD

## 2023-04-17 NOTE — Patient Instructions (Signed)
Medication Instructions:  The current medical regimen is effective;  continue present plan and medications.  *If you need a refill on your cardiac medications before your next appointment, please call your pharmacy*  Follow-Up: At [facility], you and your health needs are our priority.  As part of our continuing mission to provide you with exceptional heart care, we have created designated Provider Care Teams.  These Care Teams include your primary Cardiologist (physician) and Advanced Practice Providers (APPs -  Physician Assistants and Nurse Practitioners) who all work together to provide you with the care you need, when you need it.  We recommend signing up for the patient portal called "MyChart".  Sign up information is provided on this After Visit Summary.  MyChart is used to connect with patients for Virtual Visits (Telemedicine).  Patients are able to view lab/test results, encounter notes, upcoming appointments, etc.  Non-urgent messages can be sent to your provider as well.   To learn more about what you can do with MyChart, go to [email or web address].    Your next appointment:   1 year(s)  Provider:   Tessa Conte, PA-C, Ernest Dick, NP, Michele Lenze, PA-C, Michelle Swinyer, NP, or Scott Weaver, PA-C      Then, Mark Skains, MD will plan to see you again in 2 year(s).     

## 2023-04-18 ENCOUNTER — Encounter: Payer: Self-pay | Admitting: Nurse Practitioner

## 2023-04-18 DIAGNOSIS — I2510 Atherosclerotic heart disease of native coronary artery without angina pectoris: Secondary | ICD-10-CM | POA: Insufficient documentation

## 2023-05-02 ENCOUNTER — Ambulatory Visit: Payer: Medicare Other | Admitting: Nurse Practitioner

## 2023-05-12 ENCOUNTER — Other Ambulatory Visit: Payer: Self-pay | Admitting: Nurse Practitioner

## 2023-05-12 ENCOUNTER — Other Ambulatory Visit: Payer: Self-pay | Admitting: Cardiology

## 2023-05-12 DIAGNOSIS — I10 Essential (primary) hypertension: Secondary | ICD-10-CM

## 2023-05-25 ENCOUNTER — Encounter: Payer: Self-pay | Admitting: Nurse Practitioner

## 2023-05-25 ENCOUNTER — Ambulatory Visit (INDEPENDENT_AMBULATORY_CARE_PROVIDER_SITE_OTHER): Payer: Medicare Other | Admitting: Nurse Practitioner

## 2023-05-25 VITALS — BP 130/76 | HR 69 | Temp 98.1°F | Ht 67.0 in | Wt 183.4 lb

## 2023-05-25 DIAGNOSIS — I10 Essential (primary) hypertension: Secondary | ICD-10-CM

## 2023-05-25 DIAGNOSIS — E7800 Pure hypercholesterolemia, unspecified: Secondary | ICD-10-CM | POA: Diagnosis not present

## 2023-05-25 DIAGNOSIS — R634 Abnormal weight loss: Secondary | ICD-10-CM | POA: Diagnosis not present

## 2023-05-25 DIAGNOSIS — Z2821 Immunization not carried out because of patient refusal: Secondary | ICD-10-CM

## 2023-05-25 NOTE — Progress Notes (Unsigned)
Madelaine Bhat, CMA,acting as a Neurosurgeon for [name], FNP.,have documented all relevant documentation on the behalf of [name], FNP,as directed by  [name], FNP while in the presence of [name], FNP.  Subjective:  Patient ID: Ryan Craig , male    DOB: 1946/10/18 , 77 y.o.   MRN: [id number]  Chief Complaint  Patient presents with   Hypertension    HPI  Patient presents today for a BP and Chol check. Patient reports compliance with medications. Patient denies any chest pain, SOB, or headache. Patient reports he has been having left shoulder pain for a while, he reports it sometimes pops when he moves. He also reports he is wondering why he is losing a lot of weight, he reports he is weight at home was 175 lbs at home. Reports he will have some shortness of breath when he walks for long periods such as at stores.   BP Readings from Last 3 Encounters: 05/25/23 : 130/76 04/17/23 : 122/60 12/27/22 : 138/68    Hypertension This is a chronic problem. The current episode started more than 1 year ago. The problem is unchanged. The problem is controlled. Pertinent negatives include no chest pain, headaches, palpitations or shortness of breath. There are no associated agents to hypertension. Risk factors for coronary artery disease include dyslipidemia, obesity and male gender. Past treatments include ACE inhibitors and calcium channel blockers. The current treatment provides significant improvement. There are no compliance problems.  There is no history of angina. There is no history of chronic renal disease.     Past Medical History:  Diagnosis Date   Anemia    no on any meds   Arthritis    Bifascicular block    Chronic back pain    deteriorating   Hyperlipidemia    Hypertension    Joint pain    Joint swelling    Mild CAD    Myocarditis (HCC)    PAT (paroxysmal atrial tachycardia)    Premature atrial contractions    PVC's (premature ventricular contractions)     Thoracic aortic aneurysm (TAA) (HCC)      Family History  Problem Relation Age of Onset   Heart attack Mother    Cancer Maternal Aunt    Cancer Paternal Aunt    Cancer Cousin      Current Outpatient Medications:    acetaminophen (TYLENOL) 500 MG tablet, Take 500 mg by mouth every 6 (six) hours as needed., Disp: , Rfl:    amLODipine (NORVASC) 10 MG tablet, Take 1 tablet (10 mg total) by mouth daily., Disp: 90 tablet, Rfl: 3   aspirin EC 81 MG tablet, Take 81 mg by mouth daily., Disp: , Rfl:    BLACK CURRANT SEED OIL PO, Take by mouth., Disp: , Rfl:    Cholecalciferol (VITAMIN D-3 PO), Take 2 tablets by mouth daily. , Disp: , Rfl:    lisinopril (ZESTRIL) 10 MG tablet, TAKE 1 TABLET BY MOUTH EVERY DAY, Disp: 90 tablet, Rfl: 1   metoprolol succinate (TOPROL-XL) 25 MG 24 hr tablet, TAKE 1 TABLET BY MOUTH EVERY DAY (NEED OFFIE VISIT), Disp: 90 tablet, Rfl: 3   rosuvastatin (CRESTOR) 40 MG tablet, TAKE 1 TABLET BY MOUTH EVERY DAY, Disp: 90 tablet, Rfl: 0   triamcinolone cream (KENALOG) 0.1 %, APPLY TOPICALLY A THIN LAYER TO THE AFFECTED AREA(S) TWICE A DAY, Disp: 454 g, Rfl: 0   vitamin B-12 (CYANOCOBALAMIN) 100 MCG tablet, Take 1 tablet (100 mcg total) by mouth daily.,  Disp: 90 tablet, Rfl: 1   HYDROcodone-acetaminophen (NORCO/VICODIN) 5-325 MG tablet, Take 1 tablet by mouth 2 (two) times daily as needed for moderate pain. (Patient not taking: Reported on 04/17/2023), Disp: 40 tablet, Rfl: 0   Allergies  Allergen Reactions   Contrast Media [Iodinated Contrast Media] Anaphylaxis   Latex Rash     Review of Systems  Constitutional: Negative.   HENT: Negative.    Eyes: Negative.   Respiratory: Negative.  Negative for shortness of breath.   Cardiovascular: Negative.  Negative for chest pain, palpitations and leg swelling.  Gastrointestinal: Negative.   Neurological:  Negative for headaches.  Psychiatric/Behavioral: Negative.       Today's Vitals   05/25/23 0914  BP: 130/76  Pulse: 69   Temp: 98.1 F (36.7 C)  TempSrc: Oral  Weight: 183 lb 6.4 oz (83.2 kg)  Height: 5\' 7"  (1.702 m)  PainSc: 0-No pain   Body mass index is 28.72 kg/m.  Wt Readings from Last 3 Encounters:  05/25/23 183 lb 6.4 oz (83.2 kg)  04/17/23 187 lb 12.8 oz (85.2 kg)  12/27/22 195 lb (88.5 kg)     Objective:  Physical Exam Vitals reviewed.  Constitutional:      General: He is not in acute distress.    Appearance: Normal appearance.  Cardiovascular:     Rate and Rhythm: Normal rate and regular rhythm.     Pulses: Normal pulses.     Heart sounds: Normal heart sounds. No murmur heard. Pulmonary:     Effort: Pulmonary effort is normal. No respiratory distress.     Breath sounds: Normal breath sounds. No wheezing.  Skin:    General: Skin is warm.     Capillary Refill: Capillary refill takes less than 2 seconds.  Neurological:     General: No focal deficit present.     Mental Status: He is alert and oriented to person, place, and time.     Cranial Nerves: No cranial nerve deficit.     Motor: No weakness.  Psychiatric:        Attention and Perception: Attention and perception normal.        Mood and Affect: Mood and affect normal.        Speech: Speech normal.        Behavior: Behavior normal.        Cognition and Memory: He exhibits impaired recent memory.         Assessment And Plan:  Essential hypertension  Pure hypercholesterolemia  Herpes zoster vaccination declined  Tetanus, diphtheria, and acellular pertussis (Tdap) vaccination declined    Return for uncontrolled bp 3-78months check.  Patient was given opportunity to ask questions. Patient verbalized understanding of the plan and was able to repeat key elements of the plan. All questions were answered to their satisfaction.    Jeanell Sparrow, FNP, have reviewed all documentation for this visit. The documentation on 05/25/23 for the exam, diagnosis, procedures, and orders are all accurate and complete.   IF YOU HAVE  BEEN REFERRED TO A SPECIALIST, IT MAY TAKE 1-2 WEEKS TO SCHEDULE/PROCESS THE REFERRAL. IF YOU HAVE NOT HEARD FROM US/SPECIALIST IN TWO WEEKS, PLEASE GIVE [place] A CALL AT [phone].

## 2023-05-28 ENCOUNTER — Other Ambulatory Visit: Payer: Medicare Other

## 2023-05-28 DIAGNOSIS — R634 Abnormal weight loss: Secondary | ICD-10-CM | POA: Diagnosis not present

## 2023-05-28 DIAGNOSIS — E7800 Pure hypercholesterolemia, unspecified: Secondary | ICD-10-CM | POA: Diagnosis not present

## 2023-05-28 DIAGNOSIS — I10 Essential (primary) hypertension: Secondary | ICD-10-CM | POA: Diagnosis not present

## 2023-05-30 ENCOUNTER — Other Ambulatory Visit: Payer: Self-pay

## 2023-05-30 DIAGNOSIS — Z2821 Immunization not carried out because of patient refusal: Secondary | ICD-10-CM | POA: Insufficient documentation

## 2023-05-30 DIAGNOSIS — R634 Abnormal weight loss: Secondary | ICD-10-CM | POA: Insufficient documentation

## 2023-05-30 DIAGNOSIS — D649 Anemia, unspecified: Secondary | ICD-10-CM

## 2023-05-30 NOTE — Assessment & Plan Note (Signed)
Has had a 12 pound weight loss since March 2024.  Will check metabolic causes pending results will consider referral to GI or check chest x-ray.

## 2023-05-30 NOTE — Assessment & Plan Note (Signed)
Declines shingrix, educated on disease process and is aware if he changes his mind to notify office  

## 2023-05-30 NOTE — Assessment & Plan Note (Signed)
Blood pressure is fairly controlled.  Advised to focus on lifestyle modifications.

## 2023-05-30 NOTE — Assessment & Plan Note (Signed)
Cholesterol levels are stable.  Continue statin, tolerating well and follow-up with cardiology

## 2023-06-05 ENCOUNTER — Ambulatory Visit: Payer: Medicare Other | Admitting: Nurse Practitioner

## 2023-06-06 DIAGNOSIS — M549 Dorsalgia, unspecified: Secondary | ICD-10-CM | POA: Diagnosis not present

## 2023-06-06 DIAGNOSIS — R634 Abnormal weight loss: Secondary | ICD-10-CM | POA: Diagnosis not present

## 2023-07-02 ENCOUNTER — Ambulatory Visit (INDEPENDENT_AMBULATORY_CARE_PROVIDER_SITE_OTHER): Payer: Medicare Other | Admitting: Physician Assistant

## 2023-07-02 ENCOUNTER — Encounter: Payer: Self-pay | Admitting: Physician Assistant

## 2023-07-02 ENCOUNTER — Other Ambulatory Visit (INDEPENDENT_AMBULATORY_CARE_PROVIDER_SITE_OTHER): Payer: Medicare Other

## 2023-07-02 DIAGNOSIS — M545 Low back pain, unspecified: Secondary | ICD-10-CM

## 2023-07-02 MED ORDER — METHYLPREDNISOLONE 4 MG PO TABS: ORAL_TABLET | ORAL | 0 refills | Status: DC

## 2023-07-02 MED ORDER — BACLOFEN 10 MG PO TABS: 10.0000 mg | ORAL_TABLET | Freq: Three times a day (TID) | ORAL | 0 refills | Status: DC

## 2023-07-02 NOTE — Progress Notes (Signed)
Office Visit Note   Patient: Ryan Craig           Date of Birth: 03/24/1946           MRN: [id number] Visit Date: 07/02/2023              Requested by: [name], FNP [address] [place],  [place] [address] PCP: [name], FNP   Assessment & Plan: Visit Diagnoses:  1. Acute left-sided low back pain, unspecified whether sciatica present     Plan: Offered him formal therapy he defers.  Given the fact that he has no radicular symptoms we will start with conservative measures will consist of back exercises as shown by therapy in the past.  Baclofen and Medrol Dosepak.  He will follow-up with [place] in 2 weeks see how he is doing overall.  He will see [place] sooner if he develops any radicular symptoms or bowel or bladder incontinence.  Follow-Up Instructions: Return in about 2 weeks (around 07/16/2023).   Orders:  Orders Placed This Encounter  Procedures   XR Lumbar Spine 2-3 Views   Meds ordered this encounter  Medications   methylPREDNISolone (MEDROL) 4 MG tablet    Sig: Take as directed    Dispense:  21 tablet    Refill:  0   baclofen (LIORESAL) 10 MG tablet    Sig: Take 1 tablet (10 mg total) by mouth 3 (three) times daily.    Dispense:  30 each    Refill:  0      Procedures: No procedures performed   Clinical Data: No additional findings.   Subjective: Chief Complaint  Patient presents with   Lower Back - Pain    HPI [name] is well-known to Dr. Raye Sorrow service.  Comes in today with back pain.  No injury.  Pain is worse with standing.  States the pain is very debilitating where he can barely walk.  Started 3 days ago.  He denies any waking pain, bowel or bladder incontinence, saddle anesthesia like symptoms, fevers or chills.  He states that he was reaching for a newspaper had a "snap" in his back.  He states the lower left side of his back is tender to the touch.  Nothing is helping with his pain.  He has tried aspirin Tylenol and heat.  He is  having no radicular symptoms down either leg. He was last seen by Dr. Alvester Morin in February 2023 and given left L3 transforaminal injection which gave him good relief from the radicular symptoms and low back pain he was having at that time.  MRI in 2022 of his lumbar spine showed severe spinal stenosis at L2-3 and L3-4.  L4-5 disc extrusion with a left L5 nerve root impingement in the left lateral recess.  He states his back is done well since the injection with Dr. Alvester Morin in February 2023. Review of Systems See HPI otherwise negative  Objective: Vital Signs: There were no vitals taken for this visit.  Physical Exam Constitutional:      Appearance: He is not ill-appearing or diaphoretic.  Cardiovascular:     Pulses: Normal pulses.  Pulmonary:     Effort: Pulmonary effort is normal.  Neurological:     Mental Status: He is alert and oriented to person, place, and time.  Psychiatric:        Behavior: Behavior normal.     Ortho Exam Lower extremities 5 out of 5 strength throughout except for 4 out of 5  strength with extension of the left great toe against resistance and flexion of the lower leg against resistance which is 4 out of 5.  Straight leg raise is positive on the left negative on the right.  Tight hamstrings bilaterally.  He has decreased flexion extension lumbar spine secondary to pain.  Tenderness left lower lumbar paraspinous region. Specialty Comments:  No specialty comments available.  Imaging: XR Lumbar Spine 2-3 Views  Result Date: 07/02/2023 Lumbar spine 2 views: Loss of lordotic curvature.  No acute fractures acute findings.  Loss of disc space at L3-4.  L4-5 anterior vertebral spurring.    PMFS History: Patient Active Problem List   Diagnosis Date Noted   Herpes zoster vaccination declined 05/30/2023   Tetanus, diphtheria, and acellular pertussis (Tdap) vaccination declined 05/30/2023   Weight loss, abnormal 05/30/2023   Coronary artery disease 04/18/2023   Thoracic  aortic aneurysm without rupture, unspecified part (HCC) 12/27/2022   Hemorrhage of rectum and anus 11/27/2022   Class 1 obesity due to excess calories with serious comorbidity and body mass index (BMI) of 31.0 to 31.9 in adult 03/27/2022   HLD (hyperlipidemia) 07/23/2020   Chest pain 07/22/2020   Chest pain of uncertain etiology    Essential hypertension 01/22/2019   Persistent cough 01/22/2019   Right bundle branch block 11/21/2018   Left anterior fascicular block 11/21/2018   Former smoker 11/21/2018   Obesity (BMI 30-39.9) 11/21/2018   Osteoarthritis of left knee 12/22/2014   Status post total left knee replacement 12/22/2014   Acute medial meniscus tear of left knee 02/24/2014   Past Medical History:  Diagnosis Date   Anemia    no on any meds   Arthritis    Bifascicular block    Chronic back pain    deteriorating   Hyperlipidemia    Hypertension    Joint pain    Joint swelling    Mild CAD    Myocarditis (HCC)    PAT (paroxysmal atrial tachycardia)    Premature atrial contractions    PVC's (premature ventricular contractions)    Thoracic aortic aneurysm (TAA) (HCC)     Family History  Problem Relation Age of Onset   Heart attack Mother    Cancer Maternal Aunt    Cancer Paternal Aunt    Cancer Cousin     Past Surgical History:  Procedure Laterality Date   arthroscopic knee surgery Right    COLONOSCOPY     INGUINAL HERNIA REPAIR Bilateral    x 4   JOINT REPLACEMENT Right 6 yrs ago   knee   KNEE ARTHROSCOPY Left 02/24/2014   Procedure: LEFT KNEE ARTHROSCOPY WITH PARTIAL MEDIAL MENISCECTOMY;  Surgeon: [name], MD;  Location: [facility] OR;  Service: Orthopedics;  Laterality: Left;   LEFT HEART CATH AND CORONARY ANGIOGRAPHY N/A 07/22/2020   Procedure: LEFT HEART CATH AND CORONARY ANGIOGRAPHY;  Surgeon: [name], MD;  Location: [facility];  Service: Cardiovascular;  Laterality: N/A;   TOTAL KNEE ARTHROPLASTY Left 12/22/2014   Procedure:  LEFT TOTAL KNEE ARTHROPLASTY;  Surgeon: [name], MD;  Location: [facility] OR;  Service: Orthopedics;  Laterality: Left;   Social History   Occupational History   Occupation: retired  Tobacco Use   Smoking status: Former    Passive exposure: Past   Smokeless tobacco: Former   Tobacco comments:    quit smoking about 6-7ytrs ago  Vaping Use   Vaping status: Never Used  Substance and Sexual Activity   Alcohol  use: No    Comment: drank over 40 years ago   Drug use: Yes    Types: Hydrocodone   Sexual activity: Not Currently

## 2023-07-18 ENCOUNTER — Ambulatory Visit (INDEPENDENT_AMBULATORY_CARE_PROVIDER_SITE_OTHER): Payer: Medicare Other | Admitting: Orthopaedic Surgery

## 2023-07-18 ENCOUNTER — Encounter: Payer: Self-pay | Admitting: Orthopaedic Surgery

## 2023-07-18 DIAGNOSIS — M5441 Lumbago with sciatica, right side: Secondary | ICD-10-CM | POA: Diagnosis not present

## 2023-07-18 DIAGNOSIS — M5442 Lumbago with sciatica, left side: Secondary | ICD-10-CM | POA: Diagnosis not present

## 2023-07-18 DIAGNOSIS — G8929 Other chronic pain: Secondary | ICD-10-CM | POA: Diagnosis not present

## 2023-07-18 MED ORDER — HYDROCODONE-ACETAMINOPHEN 5-325 MG PO TABS: 1.0000 | ORAL_TABLET | Freq: Two times a day (BID) | ORAL | 0 refills | Status: DC | PRN

## 2023-07-18 NOTE — Progress Notes (Signed)
The patient is a 77 year old gentleman we have seen for many things over the years.  He has had knee replacements he has had chronic pain and back issues.  He comes in today after being seen 2 weeks ago and we put him on baclofen and a steroid taper.  He says he would really like hydrocodone.  His wife is with him.  I counseled him extensively that he needs a pain management referral at this standpoint and that I would not continue to keep providing hydrocodone.  He does not use it every day but uses enough and has been on enough that we need to add in a different direction.  I counseled him extensively about this and I would like to send him to pain management at this standpoint.  I will send in one last prescription for hydrocodone but do not plan to refill this again.

## 2023-07-19 ENCOUNTER — Other Ambulatory Visit: Payer: Self-pay

## 2023-07-19 DIAGNOSIS — M7061 Trochanteric bursitis, right hip: Secondary | ICD-10-CM

## 2023-07-19 DIAGNOSIS — M5416 Radiculopathy, lumbar region: Secondary | ICD-10-CM

## 2023-07-19 DIAGNOSIS — G8929 Other chronic pain: Secondary | ICD-10-CM

## 2023-07-19 DIAGNOSIS — Z96652 Presence of left artificial knee joint: Secondary | ICD-10-CM

## 2023-07-21 ENCOUNTER — Other Ambulatory Visit: Payer: Self-pay | Admitting: Nurse Practitioner

## 2023-08-08 ENCOUNTER — Telehealth: Payer: Self-pay | Admitting: Orthopaedic Surgery

## 2023-08-08 ENCOUNTER — Encounter: Payer: Self-pay | Admitting: Physical Medicine and Rehabilitation

## 2023-08-08 NOTE — Telephone Encounter (Signed)
Per Deisy with Pain management at [facility] this is still under review. This can take up to 6 weeks before decision is made.

## 2023-08-08 NOTE — Telephone Encounter (Signed)
Patient called and wants a refill on hydrocodone. CB#[phone]

## 2023-08-17 NOTE — Progress Notes (Addendum)
Subjective:    Patient ID: Ryan Craig, male    DOB: 01/24/1946, 77 y.o.   MRN: [id number]  HPI HPI  Ryan Craig is a 77 y.o. year old male  who  has a past medical history of Anemia, Arthritis, Bifascicular block, Chronic back pain, Hyperlipidemia, Hypertension, Joint pain, Joint swelling, Mild CAD, Myocarditis (HCC), PAT (paroxysmal atrial tachycardia) (HCC), Premature atrial contractions, PVC's (premature ventricular contractions), and Thoracic aortic aneurysm (TAA) (HCC).   They are presenting to PM&R clinic as a new patient for pain management evaluation. They were referred by Dr. Magnus Ivan for treatment of chronic low back and right hip pain.   Per their last note 9/25: The patient is a 77 year old gentleman we have seen for many things over the years.  He has had knee replacements he has had chronic pain and back issues.  He comes in today after being seen 2 weeks ago and we put him on baclofen and a steroid taper.  He says he would really like hydrocodone.  His wife is with him.  I counseled him extensively that he needs a pain management referral at this standpoint and that I would not continue to keep providing hydrocodone.  He does not use it every day but uses enough and has been on enough that we need to add in a different direction.  I counseled him extensively about this and I would like to send him to pain management at this standpoint.  I will send in one last prescription for hydrocodone but do not plan to refill this again.    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 and "it snapped my back"; he was in and out of the hospital for 6 months after that but never had surgery  Description of pain: constant and aching Exacerbating factors: bending forward, standing, activity, and cold Remitting factors: relaxation, pain medication,  heating pad, and injection Red flag symptoms: No red flags for back pain endorsed in Hx or ROS  Medications tried: Topical medications (mild effect) : Voltaren on his knees; stopped due to increasing his BP Nsaids ( can't take ) :  Tylenol  (mild effect) : Takes 1 in the morning and 1 at night when he's not on hydrocodone Opiates  (moderate effect) :   Norco 5 mg BID PRN #30 tabs filled 07/18/34. Has been taking for several years. Takes 1 in the morning and 1 at 5 pm. Wife says taking it does not change his functional status, but "it makes him want to get out and start moving, just go". No constipation, no drowsiness.   He has never tried another controlled medication.   Gabapentin / Lyrica  (never tried) :  TCAs  (never tried) :  SNRIs  (never tried) :  Other  (never tried) : Prescribed Baclofen by Ortho; he never started them because "he read something on them and he didn't want to take them."  Steroid didn't help.   Other treatments: PT/OT  (no effect) : Most recently last year for his shoulders, prior to that 2 years ago for his; he didn't feel it was beneficial. He does HEP every day but doesn't have any for his back. He says "I'm scared if I bend down my back will snap". He cannot swim.   Accupuncture/chiropractor/massage  (never tried) :   TENs unit (  moderate effect) : Had many years ago in [place]; he felt it was helpful.   Injections (moderate effect) : Used to get ESI in his back, which helped  and lasted 3+ months, most recently 1-2 years ago, unsure why it was stopped.   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 He can do some ADLs but has to sit down and take breaks frequently.   Prior UDS results: No results found for: "LABOPIA", "COCAINSCRNUR", "LABBENZ",  "AMPHETMU", "THCU", "LABBARB"    Pain Inventory Average Pain 7 Pain Right Now 8 My pain is intermittent and aching  In the last 24 hours, has pain interfered with the following? General activity 10 Relation with others 10 Enjoyment of life 10 What TIME of day is your pain at its worst? evening Sleep (in general) Fair  Pain is worse with: walking Pain improves with: heat/ice and medication Relief from Meds: 10  use a cane ability to climb steps?  yes do you drive?  no  retired  trouble walking  Any changes since last visit?  no  Any changes since last visit?  no    Family History  Problem Relation Age of Onset   Heart attack Mother    Cancer Maternal Aunt    Cancer Paternal Aunt    Cancer Cousin    Social History   Socioeconomic History   Marital status: Married    Spouse name: Not on file   Number of children: Not on file   Years of education: Not on file   Highest education level: Not on file  Occupational History   Occupation: retired  Tobacco Use   Smoking status: Former    Passive exposure: Past   Smokeless tobacco: Former   Tobacco comments:    quit smoking about 77ytrs ago  Advertising account planner   Vaping status: Never Used  Substance and Sexual Activity   Alcohol use: No    Comment: drank over 40 years ago   Drug use: Yes    Types: Hydrocodone   Sexual activity: Not Currently  Other Topics Concern   Not on file  Social History Narrative   Not on file   Social Determinants of Health   Financial Resource Strain: Low Risk  (12/20/2022)   Overall Financial Resource Strain (CARDIA)    Difficulty of Paying Living Expenses: Not hard at all  Food Insecurity: No Food Insecurity (12/20/2022)   Hunger Vital Sign    Worried About Running Out of Food in the Last Year: Never true    Ran Out of Food in the Last Year: Never true  Transportation Needs: No Transportation Needs (12/20/2022)   PRAPARE - Administrator, Civil Service (Medical): No     Lack of Transportation (Non-Medical): No  Physical Activity: Inactive (12/20/2022)   Exercise Vital Sign    Days of Exercise per Week: 0 days    Minutes of Exercise per Session: 0 min  Stress: No Stress Concern Present (12/20/2022)   [employer] of Occupational Health - Occupational Stress Questionnaire    Feeling of Stress : Not at all  Social Connections: Not on file   Past Surgical History:  Procedure Laterality Date   arthroscopic knee surgery Right  COLONOSCOPY     INGUINAL HERNIA REPAIR Bilateral    x 4   JOINT REPLACEMENT Right 6 yrs ago   knee   KNEE ARTHROSCOPY Left 02/24/2014   Procedure: LEFT KNEE ARTHROSCOPY WITH PARTIAL MEDIAL MENISCECTOMY;  Surgeon: [name], MD;  Location: [facility] OR;  Service: Orthopedics;  Laterality: Left;   LEFT HEART CATH AND CORONARY ANGIOGRAPHY N/A 07/22/2020   Procedure: LEFT HEART CATH AND CORONARY ANGIOGRAPHY;  Surgeon: [name], MD;  Location: [facility] INVAS[facility] CV LAB;  Service: Cardiovascular;  Laterality: N/A;   TOTAL KNEE ARTHROPLASTY Left 12/22/2014   Procedure: LEFT TOTAL KNEE ARTHROPLASTY;  Surgeon: [name], MD;  Location: [facility] OR;  Service: Orthopedics;  Laterality: Left;   Past Medical History:  Diagnosis Date   Anemia    no on any meds   Arthritis    Bifascicular block    Chronic back pain    deteriorating   Hyperlipidemia    Hypertension    Joint pain    Joint swelling    Mild CAD    Myocarditis (HCC)    PAT (paroxysmal atrial tachycardia)    Premature atrial contractions    PVC's (premature ventricular contractions)    Thoracic aortic aneurysm (TAA) (HCC)    There were no vitals taken for this visit.  Opioid Risk Score:   Fall Risk Score:  `1  Depression screen [facility] 2/9     12/27/2022    8:27 AM 12/20/2022    8:32 AM 12/07/2021    8:33 AM 11/25/2020    9:33 AM 10/29/2019    9:22 AM 01/22/2019    9:15 AM 10/22/2018    9:09 AM  Depression screen PHQ 2/9  Decreased Interest 0 0 0 0 0 0  0  Down, Depressed, Hopeless 0 0 0 0 0 0 0  PHQ - 2 Score 0 0 0 0 0 0 0  Altered sleeping     0    Tired, decreased energy     0    Change in appetite     0    Feeling bad or failure about yourself      0    Trouble concentrating     0    Moving slowly or fidgety/restless     0    Suicidal thoughts     0    PHQ-9 Score     0    Difficult doing work/chores     Not difficult at all      Review of Systems  Musculoskeletal:  Positive for back pain and gait problem.  All other systems reviewed and are negative.     Objective:   Physical Exam   PE: Constitution: Appropriate appearance for age. No apparent distress Resp: No respiratory distress. No accessory muscle usage. on RA Cardio: Well perfused appearance. No peripheral edema. Abdomen: Nondistended. Nontender.   Psych: Appropriate mood and affect. Neuro: AAOx4. No apparent cognitive deficits   Neurologic Exam:   DTRs: Reflexes were 2+ in bilateral achilles, patella, biceps, BR and triceps. Babinsky: flexor responses b/l.   Hoffmans: negative b/l Sensory exam: revealed normal sensation in all dermatomal regions in bilateral upper extremities and bilateral lower extremities Motor exam: strength 5/5 throughout bilateral upper extremities, bilateral lower extremities, and with exception of 4/5 bilateral hip flexion Coordination: Fine motor coordination was normal.   Gait: +antalgic gait, R trendelenberg, uses cane in R hand, some R heel catching but overall stable pattern  Back Exam:  Inspection: Pelvis was  even.  Lumbar lordotic curvature was  reduced.  There was mild evidence of scoliosis.  Palpation: Palpatory exam revealed ttp at the bilateral lumbar paraspinals only, no PSIS, SI joint, or hip pain. There was  no evidence of spasm.   ROM revealed restricted ROM in flexion and extension . Special/provocative testing:    SLR: -   Slump test: +   Facet loading: - (very non-specific)   TTP at paraspinals: +(sensitive for  facet pain...if no ttp then likely not facet pain)   Information in () parenthesis is normals/details of specific exam.     Assessment & Plan:   Ryan Craig is a 77 y.o. year old male  who  has a past medical history of Anemia, Arthritis, Bifascicular block, Chronic back pain, Hyperlipidemia, Hypertension, Joint pain, Joint swelling, Mild CAD, Myocarditis (HCC), PAT (paroxysmal atrial tachycardia) (HCC), Premature atrial contractions, PVC's (premature ventricular contractions), and Thoracic aortic aneurysm (TAA) (HCC).   They are presenting to PM&R clinic as a new patient for treatment of  L > R chronic low back with L sciatica, and bilateral knee pain s/p TKRs 2008 and 2009.   Chronic pain syndrome Encounter for therapeutic drug monitoring Encounter for long-term use of opiate analgesic  Today, we signed a pain contract and performed a urine drug screen.  I have prescribed Tramadol 50 mg twice daily for 30 days.   If urine results are as expected (can take 3-5 days), I will also send #15 tabs hydrocodone acetaminophem 5-325 mg for breakthrough pain. The goal will be to wean you off of this over time.  Follow-up with my nurse practitioner Riley Lam in 1 month.  If pain is well-controlled on your current regimen, you will see her every other month and me every 6 months.  If not, we will follow-up more frequently.  Feel free to use MyChart between appointments to discuss any acute issues, making usually get back to within 48 hours.   Chronic bilateral low back pain with left-sided sciatica Start PT for your low back and gait; we offered aquatherapy but you declined due to being unable to swim / fear of water.   MRI lumbar spine done in 2022 showed diffuse lumbar disc and facet degeneration with severe spinal stenosis at L2-3 and L3-4.Decreased size of L4-5 disc extrusion but with persistent severe left lateral recess stenosis.   At next visit, if you've gone through at least 2-3 PT  appointments without significant improvements, we will refer you to Dr. Wynn Banker to resume ESIs   Gait and Mobility impairment A face to face evaluation was performed during today's visit. Due to the patient's chronic low back pain, Left leg neuropathic pains, and bilateral chronic knee pain from OA, the patient requires a rolator walker to perform mobility and basic ADL's. This will allow him improved stability when ambulating to prevent worsening pain or falls, as well as a seat to rest for energy conservation and to prevent exacerbating pain with activity.

## 2023-08-20 ENCOUNTER — Encounter: Payer: Self-pay | Admitting: Physical Medicine and Rehabilitation

## 2023-08-20 ENCOUNTER — Encounter
Payer: Medicare Other | Attending: Physical Medicine and Rehabilitation | Admitting: Physical Medicine and Rehabilitation

## 2023-08-20 VITALS — BP 114/74 | HR 71 | Ht 67.0 in | Wt 172.0 lb

## 2023-08-20 DIAGNOSIS — R2689 Other abnormalities of gait and mobility: Secondary | ICD-10-CM

## 2023-08-20 DIAGNOSIS — G8929 Other chronic pain: Secondary | ICD-10-CM | POA: Diagnosis not present

## 2023-08-20 DIAGNOSIS — M5442 Lumbago with sciatica, left side: Secondary | ICD-10-CM | POA: Insufficient documentation

## 2023-08-20 DIAGNOSIS — Z5181 Encounter for therapeutic drug level monitoring: Secondary | ICD-10-CM | POA: Insufficient documentation

## 2023-08-20 DIAGNOSIS — Z79891 Long term (current) use of opiate analgesic: Secondary | ICD-10-CM | POA: Diagnosis not present

## 2023-08-20 DIAGNOSIS — G894 Chronic pain syndrome: Secondary | ICD-10-CM | POA: Insufficient documentation

## 2023-08-20 DIAGNOSIS — M7061 Trochanteric bursitis, right hip: Secondary | ICD-10-CM | POA: Insufficient documentation

## 2023-08-20 MED ORDER — TRAMADOL HCL 50 MG PO TABS
50.0000 mg | ORAL_TABLET | Freq: Two times a day (BID) | ORAL | 0 refills | Status: DC
Start: 2023-08-20 — End: 2023-09-17

## 2023-08-20 NOTE — Patient Instructions (Signed)
Chronic pain syndrome Encounter for therapeutic drug monitoring Encounter for long-term use of opiate analgesic  Today, we signed a pain ocntract and performed a urine drug screen.  I have prescribed Tramadol 50 mg twice daily for 30 days.   If urine results are as expected (can take 3-5 days), I will also send #15 tabs hydrocodone acetaminophem 5-325 mg for breakthrough pain. The goal will be to wean you off of this over time.  Follow-up with my nurse practitioner Riley Lam in 1 month.  If pain is well-controlled on your current regimen, you will see her every other month and me every 6 months.  If not, we will follow-up more frequently.  Feel free to use MyChart between appointments to discuss any acute issues, making usually get back to within 48 hours.    Chronic bilateral low back pain with right-sided sciatica Start PT for your low back and gait; we offered aquatherapy but you declined due to being unable to swim / fear of water.   MRI lumbar spine done in 2022 and should be recent enough  At next visit, if you've gone through at least 4 PT appointments without significant improvements, we will refer you to Dr. Wynn Banker to resume ESIs

## 2023-08-23 LAB — TOXASSURE SELECT,+ANTIDEPR,UR

## 2023-08-24 MED ORDER — HYDROCODONE-ACETAMINOPHEN 5-325 MG PO TABS: 1.0000 | ORAL_TABLET | Freq: Every day | ORAL | 0 refills | Status: DC | PRN

## 2023-08-24 NOTE — Addendum Note (Signed)
Addended by: Elijah Birk on: 08/24/2023 02:23 PM   Modules accepted: Orders

## 2023-08-28 ENCOUNTER — Ambulatory Visit (INDEPENDENT_AMBULATORY_CARE_PROVIDER_SITE_OTHER): Payer: Medicare Other | Admitting: Nurse Practitioner

## 2023-08-28 ENCOUNTER — Encounter: Payer: Self-pay | Admitting: Nurse Practitioner

## 2023-08-28 VITALS — BP 120/70 | HR 59 | Temp 97.9°F | Ht 67.0 in | Wt 175.0 lb

## 2023-08-28 DIAGNOSIS — Z23 Encounter for immunization: Secondary | ICD-10-CM | POA: Diagnosis not present

## 2023-08-28 DIAGNOSIS — I10 Essential (primary) hypertension: Secondary | ICD-10-CM

## 2023-08-28 DIAGNOSIS — Z6827 Body mass index (BMI) 27.0-27.9, adult: Secondary | ICD-10-CM | POA: Diagnosis not present

## 2023-08-28 DIAGNOSIS — R7309 Other abnormal glucose: Secondary | ICD-10-CM | POA: Diagnosis not present

## 2023-08-28 DIAGNOSIS — R634 Abnormal weight loss: Secondary | ICD-10-CM | POA: Diagnosis not present

## 2023-08-28 DIAGNOSIS — Z808 Family history of malignant neoplasm of other organs or systems: Secondary | ICD-10-CM | POA: Diagnosis not present

## 2023-08-28 DIAGNOSIS — E7800 Pure hypercholesterolemia, unspecified: Secondary | ICD-10-CM | POA: Diagnosis not present

## 2023-08-28 NOTE — Therapy (Signed)
OUTPATIENT PHYSICAL THERAPY THORACOLUMBAR EVALUATION   Patient Name: Ryan Craig MRN: [id number] DOB:September 25, 1946, 77 y.o., male Today's Date: 08/29/2023  END OF SESSION:  PT End of Session - 08/29/23 1111     Visit Number 1    Number of Visits 11    Date for PT Re-Evaluation 10/10/23    Authorization Type Medicare/AARP    PT Start Time 1115    PT Stop Time 1200    PT Time Calculation (min) 45 min    Activity Tolerance Patient tolerated treatment well    Behavior During Therapy WFL for tasks assessed/performed             Past Medical History:  Diagnosis Date   Anemia    no on any meds   Arthritis    Bifascicular block    Chronic back pain    deteriorating   Hyperlipidemia    Hypertension    Joint pain    Joint swelling    Mild CAD    Myocarditis (HCC)    PAT (paroxysmal atrial tachycardia) (HCC)    Premature atrial contractions    PVC's (premature ventricular contractions)    Thoracic aortic aneurysm (TAA) (HCC)    Past Surgical History:  Procedure Laterality Date   arthroscopic knee surgery Right    COLONOSCOPY     INGUINAL HERNIA REPAIR Bilateral    x 4   JOINT REPLACEMENT Right 6 yrs ago   knee   KNEE ARTHROSCOPY Left 02/24/2014   Procedure: LEFT KNEE ARTHROSCOPY WITH PARTIAL MEDIAL MENISCECTOMY;  Surgeon: [name], MD;  Location: [facility] OR;  Service: Orthopedics;  Laterality: Left;   LEFT HEART CATH AND CORONARY ANGIOGRAPHY N/A 07/22/2020   Procedure: LEFT HEART CATH AND CORONARY ANGIOGRAPHY;  Surgeon: [name], MD;  Location: [facility] INVASIVE [facility];  Service: Cardiovascular;  Laterality: N/A;   TOTAL KNEE ARTHROPLASTY Left 12/22/2014   Procedure: LEFT TOTAL KNEE ARTHROPLASTY;  Surgeon: [name], MD;  Location: [facility] OR;  Service: Orthopedics;  Laterality: Left;   Patient Active Problem List   Diagnosis Date Noted   Abnormal glucose 08/28/2023   Need for influenza vaccination 08/28/2023   Family history of bone cancer  08/28/2023   Chronic pain syndrome 08/20/2023   Chronic bilateral low back pain with left-sided sciatica 08/20/2023   Encounter for long-term use of opiate analgesic 08/20/2023   Trochanteric bursitis of right hip 08/20/2023   Herpes zoster vaccination declined 05/30/2023   Tetanus, diphtheria, and acellular pertussis (Tdap) vaccination declined 05/30/2023   Weight loss, abnormal 05/30/2023   Coronary artery disease 04/18/2023   Thoracic aortic aneurysm without rupture, unspecified part (HCC) 12/27/2022   Hemorrhage of rectum and anus 11/27/2022   Class 1 obesity due to excess calories with serious comorbidity and body mass index (BMI) of 31.0 to 31.9 in adult 03/27/2022   HLD (hyperlipidemia) 07/23/2020   Chest pain 07/22/2020   Chest pain of uncertain etiology    Essential hypertension 01/22/2019   Persistent cough 01/22/2019   Right bundle branch block 11/21/2018   Left anterior fascicular block 11/21/2018   Former smoker 11/21/2018   Obesity (BMI 30-39.9) 11/21/2018   Osteoarthritis of left knee 12/22/2014   Status post total left knee replacement 12/22/2014   Acute medial meniscus tear of left knee 02/24/2014    PCP: [name], FNP  REFERRING PROVIDER: Angelina Sheriff, DO  REFERRING DIAG: G89.4 (ICD-10-CM) - Chronic pain syndrome M54.41,G89.29 (ICD-10-CM) - Chronic bilateral low back pain with right-sided sciatica  Rationale for Evaluation and Treatment: Rehabilitation  THERAPY DIAG:  Other low back pain  Muscle weakness (generalized)  Chronic pain of both knees  ONSET DATE: 08/20/2023  SUBJECTIVE:                                                                                                                                                                                           SUBJECTIVE STATEMENT: Pt reports of chronic back pain. Mostly localized to left side of back but also travels to R side of back. Pt denies radicular symptoms. Pt reports of bil knee  pain. Pt had bil TKA doen >15 years ago. Pt uses st. Cane for mobility at home and community. Pt reports   PERTINENT HISTORY:  past medical history of Anemia, Arthritis, Bifascicular block, Chronic back pain, Hyperlipidemia, Hypertension, Joint pain, Joint swelling, Mild CAD, Myocarditis (HCC), PAT (paroxysmal atrial tachycardia) (HCC), Premature atrial contractions, PVC's (premature ventricular contractions), and Thoracic aortic aneurysm (TAA) (HCC).  PAIN:  Are you having pain? Yes: NPRS scale: 9/10 Pain location: lower back L>R Pain description: achy, stiff  Aggravating factors: standing, walking bending Relieving factors: sitting rest  PRECAUTIONS: None  RED FLAGS: None   WEIGHT BEARING RESTRICTIONS: No  FALLS:  Has patient fallen in last 6 months? No  L PATIENT GOALS: improve pain management    OBJECTIVE:  Note: Objective measures were completed at Evaluation unless otherwise noted.  DIAGNOSTIC FINDINGS:  08/20/2021 IMPRESSION: 1. Progressive diffuse lumbar disc and facet degeneration with severe spinal stenosis at L2-3 and L3-4. 2. Decreased size of L4-5 disc extrusion but with persistent severe left lateral recess stenosis.   COGNITION: Overall cognitive status: Within functional limits for tasks assessed    POSTURE: rounded shoulders, forward head, increased lumbar lordosis, increased thoracic kyphosis, posterior pelvic tilt, and flexed trunk    LUMBAR ROM:   AROM eval  Flexion   Extension   Right lateral flexion   Left lateral flexion   Right rotation   Left rotation    (Blank rows = not tested)  LOWER EXTREMITY ROM:     Active  Right eval Left eval  Hip flexion    Hip extension    Hip abduction    Hip adduction    Hip internal rotation    Hip external rotation    Knee flexion    Knee extension    Ankle dorsiflexion    Ankle plantarflexion    Ankle inversion    Ankle eversion     (Blank rows = not tested)  LOWER EXTREMITY MMT:     MMT Right eval Left eval  Hip flexion  Hip extension    Hip abduction    Hip adduction    Hip internal rotation    Hip external rotation    Knee flexion    Knee extension    Ankle dorsiflexion    Ankle plantarflexion    Ankle inversion    Ankle eversion     (Blank rows = not tested)   FUNCTIONAL TESTS:  5 times sit to stand: 37 sec without UE support 6 minute walk test: Pt unable to finish test. Pt able to ambulate with st. Cane for 560 feet in 4 min 45 sec and then had to stop due to pain in his lower back 10 meter walk test: 0.52 m/s with st. cane ; 0.62 m/s  GAIT: Distance walked: ?990' total Assistive device utilized: Single point cane and Walker - 4 wheeled Level of assistance: SBA   TODAY'S TREATMENT:                                                                                                                              DATE:   Sit to stand with use of UE: 10x, pt educated to perform this in sturdy kitchen chair at home.  Pt education: pt educated on benefits of activity modification and use of rollator to improve his pain with standing/walking and to prolong his ability to be able to stand/walk using AD. We discussed he can use cane when he is at home or if he is going to store for short time. But if he is going to go to store or if he knows he is going to be out in community for long time, he can use the RW. Pt reported no significant increase in his back pain when using rollator with walking 430' while when using cane, he reported pain within 1' of 5-6/10.   Pt educated on using electric cart when he is in store if he has long grocery list to improve his ability to participate in grocery shopping with his wife (instead of waiting in the car)    PATIENT EDUCATION:  Education details: see above Person educated: Patient and Spouse Education method: Medical illustrator Education comprehension: verbalized understanding  HOME EXERCISE  PROGRAM: Access Code: [email or web address] URL: [email or web address] Date: 08/29/2023 Prepared by: Lavone Nian  Exercises - Sit to Stand with Counter Support  - 1 x daily - 7 x weekly - 1 sets - 10 reps  ASSESSMENT:  CLINICAL IMPRESSION: Patient is a 77 y.o. male who was seen today for physical therapy evaluation and treatment for chronic lower back pain and knee pain. Patient has significant hx of bil TKA which was done >15 years ago. Patient has severe stenosis in lumbar spine per MRI from >2 years ago. Pt demonstrated decreased functional strength (5x sit to stand), decreased gait speed, and decreased functional mobility (Gait speed, 6 minute walk test). Pt will benefit from skilled PT to improve pain management with home exercise program and activity  modification and improve overall strength and endurance. .   OBJECTIVE IMPAIRMENTS: Abnormal gait, decreased activity tolerance, decreased endurance, decreased mobility, difficulty walking, decreased ROM, decreased strength, hypomobility, increased fascial restrictions, increased muscle spasms, impaired flexibility, postural dysfunction, and pain.   ACTIVITY LIMITATIONS: carrying, lifting, bending, standing, squatting, stairs, and transfers  PARTICIPATION LIMITATIONS: meal prep, cleaning, laundry, shopping, community activity, and yard work  PERSONAL FACTORS: Age and Time since onset of injury/illness/exacerbation are also affecting patient's functional outcome.   REHAB POTENTIAL: Fair chronic nature of pain  CLINICAL DECISION MAKING: Stable/uncomplicated  EVALUATION COMPLEXITY: Low   GOALS: Goals reviewed with patient? Yes   SHORT TERM GOALS: No STG established   LONG TERM GOALS: Target date: 09/26/2023    Pt will demo 5x sit to stand without UE support in <25 sec to improve overall functional strength Baseline: 37 sec Goal status: INITIAL  2.  Pt will demo gait speed of >0.75 m/s with appropriate AD to improve  functional mobility and decrease fall risk Baseline: 0.52 m/s with st. cane; 0.62 m/s Goal status: INITIAL  3.  Pt will demo improve ability to finish 6 minute walk test and be abl e to walk>800 feet with appropriate AD to improve walking endurance Baseline: Pt unable to finish 6 min walk test. Only able to perform 4 min 45 sec and covered 560 feet. Goal status: INITIAL   PLAN:  PT FREQUENCY: 2x/week  PT DURATION: 4 weeks  PLANNED INTERVENTIONS: 97164- PT Re-evaluation, 97110-Therapeutic exercises, 97530- Therapeutic activity, 97112- Neuromuscular re-education, 97535- Self Care, 81191- Manual therapy, [phone]- Gait training, [phone]- Electrical stimulation (unattended), Patient/Family education, Balance training, Stair training, Joint mobilization, Spinal mobilization, Cryotherapy, and Moist heat.  PLAN FOR NEXT SESSION: practice with rollator, establish HEP   Ileana Ladd, PT 08/29/2023, 2:00 PM

## 2023-08-28 NOTE — Assessment & Plan Note (Addendum)
Has lost another 8 lbs since August. Will check CT abdomen and pelvis.  Will also recheck labs and a multiple myeloma panel since he has a family history of bone cancer. He is also having a lot of back pain. He did have a MRA of chest and did not show any abnormal findings or concerns.

## 2023-08-28 NOTE — Assessment & Plan Note (Signed)
Cholesterol levels are stable.  Continue statin, tolerating well and follow-up with cardiology

## 2023-08-28 NOTE — Assessment & Plan Note (Signed)
Will check HgbA1 due to abnormal weight loss.

## 2023-08-28 NOTE — Progress Notes (Signed)
I,Ryan Craig, CMA,acting as a Neurosurgeon for [employer], FNP.,have documented all relevant documentation on the behalf of Ryan Felts, FNP,as directed by  Ryan Felts, FNP while in the presence of Ryan Felts, FNP.  Subjective:  Patient ID: Ryan Craig , male    DOB: 1946-05-25 , 77 y.o.   MRN: [id number]  Chief Complaint  Patient presents with   Hypertension    HPI  Patient presents today for a BP and Chol check. Patient reports compliance with medications. Patient denies any chest pain, SOB, or headache. Patient is concerned about his weight loss. He has been to GI a couple months ago. Reports "is afraid he may have cancer, has a family history of bone cancer". He has been eating and trying to drink protein shakes but it irritates his stomach. He has an appt with Dr. Elnoria Craig this month or next month.   Wt Readings from Last 3 Encounters: 08/28/23 : 175 lb (79.4 kg) 08/20/23 : 172 lb (78 kg) 05/25/23 : 183 lb 6.4 oz (83.2 kg)    Hypertension This is a chronic problem. The current episode started more than 1 year ago. The problem is unchanged. The problem is controlled. Pertinent negatives include no chest pain, headaches, palpitations or shortness of breath. There are no associated agents to hypertension. Risk factors for coronary artery disease include dyslipidemia, obesity and male gender. Past treatments include ACE inhibitors and calcium channel blockers. The current treatment provides significant improvement. There are no compliance problems.  There is no history of angina. There is no history of chronic renal disease.     Past Medical History:  Diagnosis Date   Anemia    no on any meds   Arthritis    Bifascicular block    Chronic back pain    deteriorating   Hyperlipidemia    Hypertension    Joint pain    Joint swelling    Mild CAD    Myocarditis (HCC)    PAT (paroxysmal atrial tachycardia) (HCC)    Premature atrial contractions    PVC's (premature ventricular  contractions)    Thoracic aortic aneurysm (TAA) (HCC)      Family History  Problem Relation Age of Onset   Heart attack Mother    Cancer Maternal Aunt    Cancer Paternal Aunt    Cancer Cousin      Current Outpatient Medications:    acetaminophen (TYLENOL) 500 MG tablet, Take 500 mg by mouth every 6 (six) hours as needed., Disp: , Rfl:    amLODipine (NORVASC) 10 MG tablet, Take 1 tablet (10 mg total) by mouth daily., Disp: 90 tablet, Rfl: 3   aspirin EC 81 MG tablet, Take 81 mg by mouth daily., Disp: , Rfl:    BLACK CURRANT SEED OIL PO, Take by mouth., Disp: , Rfl:    Cholecalciferol (VITAMIN D-3 PO), Take 2 tablets by mouth daily. , Disp: , Rfl:    HYDROcodone-acetaminophen (NORCO) 5-325 MG tablet, Take 1 tablet by mouth daily as needed for severe pain (pain score 7-10). Use as needed once daily for breakthrough pain; you are only receiving 15 tablets for this month and are responsible for budgeting them; early refills of this medication will not be permitted., Disp: 15 tablet, Rfl: 0   lisinopril (ZESTRIL) 10 MG tablet, TAKE 1 TABLET BY MOUTH EVERY DAY, Disp: 90 tablet, Rfl: 1   methylPREDNISolone (MEDROL) 4 MG tablet, Take as directed, Disp: 21 tablet, Rfl: 0   metoprolol succinate (TOPROL-XL) 25 MG  24 hr tablet, TAKE 1 TABLET BY MOUTH EVERY DAY (NEED OFFIE VISIT), Disp: 90 tablet, Rfl: 3   rosuvastatin (CRESTOR) 40 MG tablet, TAKE 1 TABLET BY MOUTH EVERY DAY, Disp: 90 tablet, Rfl: 0   traMADol (ULTRAM) 50 MG tablet, Take 1 tablet (50 mg total) by mouth 2 (two) times daily., Disp: 60 tablet, Rfl: 0   triamcinolone cream (KENALOG) 0.1 %, APPLY TOPICALLY A THIN LAYER TO THE AFFECTED AREA(S) TWICE A DAY, Disp: 454 g, Rfl: 0   vitamin B-12 (CYANOCOBALAMIN) 100 MCG tablet, Take 1 tablet (100 mcg total) by mouth daily., Disp: 90 tablet, Rfl: 1   Allergies  Allergen Reactions   Contrast Media [Iodinated Contrast Media] Anaphylaxis   Latex Rash     Review of Systems  Constitutional:   Positive for unexpected weight change (per patient reports his weight at home was 170 lbs without clothes).  Eyes: Negative.   Respiratory:  Negative for shortness of breath.   Cardiovascular:  Negative for chest pain and palpitations.  Musculoskeletal: Negative.   Skin: Negative.   Neurological:  Negative for headaches.  Psychiatric/Behavioral: Negative.       Today's Vitals   08/28/23 0821  BP: 120/70  Pulse: (!) 59  Temp: 97.9 F (36.6 C)  Weight: 175 lb (79.4 kg)  Height: 5\' 7"  (1.702 m)  PainSc: 0-No pain   Body mass index is 27.41 kg/m.  Wt Readings from Last 3 Encounters:  08/28/23 175 lb (79.4 kg)  08/20/23 172 lb (78 kg)  05/25/23 183 lb 6.4 oz (83.2 kg)     Objective:  Physical Exam Vitals reviewed.  Constitutional:      General: He is not in acute distress.    Appearance: Normal appearance.  Cardiovascular:     Rate and Rhythm: Normal rate and regular rhythm.     Pulses: Normal pulses.     Heart sounds: Normal heart sounds. No murmur heard. Pulmonary:     Effort: Pulmonary effort is normal. No respiratory distress.     Breath sounds: Normal breath sounds. No wheezing.  Abdominal:     General: Abdomen is flat. Bowel sounds are normal. There is no distension.     Palpations: Abdomen is soft.  Musculoskeletal:     Comments: Does not have his cane today  Skin:    General: Skin is warm.     Capillary Refill: Capillary refill takes less than 2 seconds.  Neurological:     General: No focal deficit present.     Mental Status: He is alert and oriented to person, place, and time.     Cranial Nerves: No cranial nerve deficit.     Motor: No weakness.  Psychiatric:        Attention and Perception: Attention and perception normal.        Mood and Affect: Mood and affect normal.        Speech: Speech normal.        Behavior: Behavior normal.         Assessment And Plan:  Essential hypertension Assessment & Plan: Blood pressure is well controlled.  Advised  to focus on lifestyle modifications. Continue current medications.   Orders: -     Basic metabolic panel  Pure hypercholesterolemia Assessment & Plan: Cholesterol levels are stable.  Continue statin, tolerating well and follow-up with cardiology  Orders: -     Lipid panel  Weight loss, abnormal Assessment & Plan: Has lost another 8 lbs since August. Will check CT abdomen  and pelvis.  Will also recheck labs and a multiple myeloma panel since he has a family history of bone cancer. He is also having a lot of back pain. He did have a MRA of chest and did not show any abnormal findings or concerns.   Orders: -     CBC -     CT ABDOMEN PELVIS W CONTRAST; Future -     Hemoglobin A1c  Abnormal glucose Assessment & Plan: Will check HgbA1 due to abnormal weight loss.   Orders: -     Hemoglobin A1c  Need for influenza vaccination Assessment & Plan: Influenza vaccine administered Encouraged to take Tylenol as needed for fever or muscle aches.   Orders: -     Flu Vaccine Trivalent High Dose (Fluad)  BMI 27.0-27.9,adult  Family history of bone cancer -     Multiple Myeloma Panel (SPEP&IFE w/QIG)    Return for Uncontrolled BP check-3/4 months march with his AWV.  Patient was given opportunity to ask questions. Patient verbalized understanding of the plan and was able to repeat key elements of the plan. All questions were answered to their satisfaction.    Jeanell Sparrow, FNP, have reviewed all documentation for this visit. The documentation on 08/28/23 for the exam, diagnosis, procedures, and orders are all accurate and complete.   IF YOU HAVE BEEN REFERRED TO A SPECIALIST, IT MAY TAKE 1-2 WEEKS TO SCHEDULE/PROCESS THE REFERRAL. IF YOU HAVE NOT HEARD FROM US/SPECIALIST IN TWO WEEKS, PLEASE GIVE [place] A CALL AT [phone].

## 2023-08-28 NOTE — Assessment & Plan Note (Signed)
Blood pressure is well controlled.  Advised to focus on lifestyle modifications. Continue current medications.

## 2023-08-28 NOTE — Assessment & Plan Note (Signed)
Influenza vaccine administered Encouraged to take Tylenol as needed for fever or muscle aches.

## 2023-08-29 ENCOUNTER — Ambulatory Visit: Payer: Medicare Other | Attending: Physical Medicine and Rehabilitation

## 2023-08-29 ENCOUNTER — Other Ambulatory Visit: Payer: Self-pay

## 2023-08-29 DIAGNOSIS — M25562 Pain in left knee: Secondary | ICD-10-CM | POA: Insufficient documentation

## 2023-08-29 DIAGNOSIS — G894 Chronic pain syndrome: Secondary | ICD-10-CM | POA: Diagnosis not present

## 2023-08-29 DIAGNOSIS — G8929 Other chronic pain: Secondary | ICD-10-CM | POA: Insufficient documentation

## 2023-08-29 DIAGNOSIS — M25561 Pain in right knee: Secondary | ICD-10-CM | POA: Insufficient documentation

## 2023-08-29 DIAGNOSIS — M6281 Muscle weakness (generalized): Secondary | ICD-10-CM | POA: Insufficient documentation

## 2023-08-29 DIAGNOSIS — M5459 Other low back pain: Secondary | ICD-10-CM | POA: Insufficient documentation

## 2023-09-03 ENCOUNTER — Telehealth: Payer: Self-pay | Admitting: *Deleted

## 2023-09-03 ENCOUNTER — Telehealth (HOSPITAL_BASED_OUTPATIENT_CLINIC_OR_DEPARTMENT_OTHER): Payer: Self-pay | Admitting: *Deleted

## 2023-09-03 DIAGNOSIS — R630 Anorexia: Secondary | ICD-10-CM | POA: Diagnosis not present

## 2023-09-03 DIAGNOSIS — Z860100 Personal history of colon polyps, unspecified: Secondary | ICD-10-CM | POA: Diagnosis not present

## 2023-09-03 DIAGNOSIS — R634 Abnormal weight loss: Secondary | ICD-10-CM | POA: Diagnosis not present

## 2023-09-03 LAB — MULTIPLE MYELOMA PANEL, SERUM
Albumin SerPl Elph-Mcnc: 3.8 g/dL (ref 2.9–4.4)
Albumin/Glob SerPl: 1.5 (ref 0.7–1.7)
Alpha 1: 0.2 g/dL (ref 0.0–0.4)
Alpha2 Glob SerPl Elph-Mcnc: 0.9 g/dL (ref 0.4–1.0)
B-Globulin SerPl Elph-Mcnc: 0.8 g/dL (ref 0.7–1.3)
Gamma Glob SerPl Elph-Mcnc: 0.7 g/dL (ref 0.4–1.8)
Globulin, Total: 2.6 g/dL (ref 2.2–3.9)
IgA/Immunoglobulin A, Serum: 115 mg/dL (ref 61–437)
IgG (Immunoglobin G), Serum: 814 mg/dL (ref 603–1613)
IgM (Immunoglobulin M), Srm: 40 mg/dL (ref 15–143)
Total Protein: 6.4 g/dL (ref 6.0–8.5)

## 2023-09-03 LAB — LIPID PANEL
Chol/HDL Ratio: 2.2 ratio (ref 0.0–5.0)
Cholesterol, Total: 130 mg/dL (ref 100–199)
HDL: 58 mg/dL (ref >39)
LDL Chol Calc (NIH): 56 mg/dL (ref 0–99)
Triglycerides: 80 mg/dL (ref 0–149)
VLDL Cholesterol Cal: 16 mg/dL (ref 5–40)

## 2023-09-03 LAB — BASIC METABOLIC PANEL
BUN/Creatinine Ratio: 16 (ref 10–24)
BUN: 14 mg/dL (ref 8–27)
CO2: 23 mmol/L (ref 20–29)
Calcium: 9.8 mg/dL (ref 8.6–10.2)
Chloride: 104 mmol/L (ref 96–106)
Creatinine, Ser: 0.9 mg/dL (ref 0.76–1.27)
Glucose: 110 mg/dL — ABNORMAL HIGH (ref 70–99)
Potassium: 5.2 mmol/L (ref 3.5–5.2)
Sodium: 142 mmol/L (ref 134–144)
eGFR: 87 mL/min/{1.73_m2} (ref >59)

## 2023-09-03 LAB — CBC
Hematocrit: 41.4 % (ref 37.5–51.0)
Hemoglobin: 12.9 g/dL — ABNORMAL LOW (ref 13.0–17.7)
MCH: 27.6 pg (ref 26.6–33.0)
MCHC: 31.2 g/dL — ABNORMAL LOW (ref 31.5–35.7)
MCV: 89 fL (ref 79–97)
Platelets: 275 10*3/uL (ref 150–450)
RBC: 4.68 x10E6/uL (ref 4.14–5.80)
RDW: 11.7 % (ref 11.6–15.4)
WBC: 5.4 10*3/uL (ref 3.4–10.8)

## 2023-09-03 LAB — HEMOGLOBIN A1C
Est. average glucose Bld gHb Est-mCnc: 123 mg/dL
Hgb A1c MFr Bld: 5.9 % — ABNORMAL HIGH (ref 4.8–5.6)

## 2023-09-03 NOTE — Telephone Encounter (Signed)
   Pre-operative Risk Assessment    Patient Name: Ryan Craig  DOB: 1946/01/21 MRN: [id number]      Request for Surgical Clearance    Procedure:   COLONOSCOPY  Date of Surgery:  Clearance 10/02/23                                 Surgeon:  DR. HUNG Surgeon's Group or Practice Name:  [facility] Phone number:  [phone] Fax number:  [phone]   Type of Clearance Requested:   - Medical  - Pharmacy:  Hold Aspirin NOT INDICATED   Type of Anesthesia:   PROPOFOL   Additional requests/questions:    [name]   09/03/2023, 2:55 PM  3

## 2023-09-03 NOTE — Telephone Encounter (Signed)
Pt has been scheduled tele pre op appt 09/18/23 @ 1:40. Med rec and consent are done.

## 2023-09-03 NOTE — Telephone Encounter (Signed)
Pt has been scheduled tele pre op appt 09/18/23 @ 1:40. Med rec and consent are done.     Patient Consent for Virtual Visit        Eugen Rigler has provided verbal consent on 09/03/2023 for a virtual visit (video or telephone).   CONSENT FOR VIRTUAL VISIT FOR:  Ryan Craig  By participating in this virtual visit I agree to the following:  I hereby voluntarily request, consent and authorize [facility] and its employed or contracted physicians, physician assistants, nurse practitioners or other licensed health care professionals (the Practitioner), to provide me with telemedicine health care services (the "Services") as deemed necessary by the treating Practitioner. I acknowledge and consent to receive the Services by the Practitioner via telemedicine. I understand that the telemedicine visit will involve communicating with the Practitioner through live audiovisual communication technology and the disclosure of certain medical information by electronic transmission. I acknowledge that I have been given the opportunity to request an in-person assessment or other available alternative prior to the telemedicine visit and am voluntarily participating in the telemedicine visit.  I understand that I have the right to withhold or withdraw my consent to the use of telemedicine in the course of my care at any time, without affecting my right to future care or treatment, and that the Practitioner or I may terminate the telemedicine visit at any time. I understand that I have the right to inspect all information obtained and/or recorded in the course of the telemedicine visit and may receive copies of available information for a reasonable fee.  I understand that some of the potential risks of receiving the Services via telemedicine include:  Delay or interruption in medical evaluation due to technological equipment failure or disruption; Information transmitted may not be sufficient (e.g. poor  resolution of images) to allow for appropriate medical decision making by the Practitioner; and/or  In rare instances, security protocols could fail, causing a breach of personal health information.  Furthermore, I acknowledge that it is my responsibility to provide information about my medical history, conditions and care that is complete and accurate to the best of my ability. I acknowledge that Practitioner's advice, recommendations, and/or decision may be based on factors not within their control, such as incomplete or inaccurate data provided by me or distortions of diagnostic images or specimens that may result from electronic transmissions. I understand that the practice of medicine is not an exact science and that Practitioner makes no warranties or guarantees regarding treatment outcomes. I acknowledge that a copy of this consent can be made available to me via my patient portal [facility] MyChart), or I can request a printed copy by calling the office of  [facility].    I understand that my insurance will be billed for this visit.   I have read or had this consent read to me. I understand the contents of this consent, which adequately explains the benefits and risks of the Services being provided via telemedicine.  I have been provided ample opportunity to ask questions regarding this consent and the Services and have had my questions answered to my satisfaction. I give my informed consent for the services to be provided through the use of telemedicine in my medical care

## 2023-09-03 NOTE — Telephone Encounter (Signed)
   Name: Ryan Craig  DOB: 05/10/1946  MRN: [id number]  Primary Cardiologist: Donato Schultz, MD   Preoperative team, please contact this patient and set up a phone call appointment for further preoperative risk assessment. Please obtain consent and complete medication review. Thank you for your help.  Last seen by Dr. Anne Fu 04/17/2023  I confirm that guidance regarding antiplatelet and oral anticoagulation therapy has been completed and, if necessary, noted below.  Per office protocol, if patient is without any new symptoms or concerns at the time of their virtual visit, he/she may hold ASA for 7 days prior to procedure. Please resume ASA as soon as possible postprocedure, at the discretion of the surgeon.    I also confirmed the patient resides in the state of [place]. As per [facility] Medical Board telemedicine laws, the patient must reside in the state in which the provider is licensed.   Joni Reining, NP 09/03/2023, 4:17 PM [facility]

## 2023-09-05 ENCOUNTER — Ambulatory Visit: Payer: Medicare Other

## 2023-09-07 ENCOUNTER — Ambulatory Visit: Payer: Medicare Other

## 2023-09-10 ENCOUNTER — Other Ambulatory Visit: Payer: Medicare Other

## 2023-09-11 ENCOUNTER — Ambulatory Visit: Payer: Medicare Other

## 2023-09-11 DIAGNOSIS — G8929 Other chronic pain: Secondary | ICD-10-CM | POA: Diagnosis not present

## 2023-09-11 DIAGNOSIS — M6281 Muscle weakness (generalized): Secondary | ICD-10-CM

## 2023-09-11 DIAGNOSIS — M25561 Pain in right knee: Secondary | ICD-10-CM | POA: Diagnosis not present

## 2023-09-11 DIAGNOSIS — G894 Chronic pain syndrome: Secondary | ICD-10-CM | POA: Diagnosis not present

## 2023-09-11 DIAGNOSIS — M5459 Other low back pain: Secondary | ICD-10-CM | POA: Diagnosis not present

## 2023-09-11 DIAGNOSIS — M25562 Pain in left knee: Secondary | ICD-10-CM | POA: Diagnosis not present

## 2023-09-11 NOTE — Therapy (Signed)
OUTPATIENT PHYSICAL THERAPY THORACOLUMBAR TREATMENT NOTE   Patient Name: Ryan Craig MRN: [id number] DOB:1946/07/21, 77 y.o., male Today's Date: 09/11/2023  END OF SESSION:  PT End of Session - 09/11/23 0929     Visit Number 2    Number of Visits 11    Date for PT Re-Evaluation 09/26/23    Authorization Type Medicare/AARP    PT Start Time 0930    PT Stop Time 1015    PT Time Calculation (min) 45 min    Activity Tolerance Patient tolerated treatment well    Behavior During Therapy WFL for tasks assessed/performed             Past Medical History:  Diagnosis Date   Anemia    no on any meds   Arthritis    Bifascicular block    Chronic back pain    deteriorating   Hyperlipidemia    Hypertension    Joint pain    Joint swelling    Mild CAD    Myocarditis (HCC)    PAT (paroxysmal atrial tachycardia) (HCC)    Premature atrial contractions    PVC's (premature ventricular contractions)    Thoracic aortic aneurysm (TAA) (HCC)    Past Surgical History:  Procedure Laterality Date   arthroscopic knee surgery Right    COLONOSCOPY     INGUINAL HERNIA REPAIR Bilateral    x 4   JOINT REPLACEMENT Right 6 yrs ago   knee   KNEE ARTHROSCOPY Left 02/24/2014   Procedure: LEFT KNEE ARTHROSCOPY WITH PARTIAL MEDIAL MENISCECTOMY;  Surgeon: [name], MD;  Location: [facility] OR;  Service: Orthopedics;  Laterality: Left;   LEFT HEART CATH AND CORONARY ANGIOGRAPHY N/A 07/22/2020   Procedure: LEFT HEART CATH AND CORONARY ANGIOGRAPHY;  Surgeon: [name], MD;  Location: [facility];  Service: Cardiovascular;  Laterality: N/A;   TOTAL KNEE ARTHROPLASTY Left 12/22/2014   Procedure: LEFT TOTAL KNEE ARTHROPLASTY;  Surgeon: [name], MD;  Location: [facility] OR;  Service: Orthopedics;  Laterality: Left;   Patient Active Problem List   Diagnosis Date Noted   Abnormal glucose 08/28/2023   Need for influenza vaccination 08/28/2023   Family history of bone  cancer 08/28/2023   Chronic pain syndrome 08/20/2023   Chronic bilateral low back pain with left-sided sciatica 08/20/2023   Encounter for long-term use of opiate analgesic 08/20/2023   Trochanteric bursitis of right hip 08/20/2023   Herpes zoster vaccination declined 05/30/2023   Tetanus, diphtheria, and acellular pertussis (Tdap) vaccination declined 05/30/2023   Weight loss, abnormal 05/30/2023   Coronary artery disease 04/18/2023   Thoracic aortic aneurysm without rupture, unspecified part (HCC) 12/27/2022   Hemorrhage of rectum and anus 11/27/2022   Class 1 obesity due to excess calories with serious comorbidity and body mass index (BMI) of 31.0 to 31.9 in adult 03/27/2022   HLD (hyperlipidemia) 07/23/2020   Chest pain 07/22/2020   Chest pain of uncertain etiology    Essential hypertension 01/22/2019   Persistent cough 01/22/2019   Right bundle branch block 11/21/2018   Left anterior fascicular block 11/21/2018   Former smoker 11/21/2018   Obesity (BMI 30-39.9) 11/21/2018   Osteoarthritis of left knee 12/22/2014   Status post total left knee replacement 12/22/2014   Acute medial meniscus tear of left knee 02/24/2014    PCP: [name], FNP  REFERRING PROVIDER: Angelina Sheriff, DO  REFERRING DIAG: G89.4 (ICD-10-CM) - Chronic pain syndrome M54.41,G89.29 (ICD-10-CM) - Chronic bilateral low back pain with right-sided  sciatica  Rationale for Evaluation and Treatment: Rehabilitation  THERAPY DIAG:  Other low back pain  Muscle weakness (generalized)  Chronic pain of both knees  ONSET DATE: 08/20/2023  SUBJECTIVE:                                                                                                                                                                                           SUBJECTIVE STATEMENT: Pt reports back is hurting. If I lean to right, it catches my back on right. Pain is about 9/10. I have been doing my exercises at home.   PERTINENT  HISTORY:  past medical history of Anemia, Arthritis, Bifascicular block, Chronic back pain, Hyperlipidemia, Hypertension, Joint pain, Joint swelling, Mild CAD, Myocarditis (HCC), PAT (paroxysmal atrial tachycardia) (HCC), Premature atrial contractions, PVC's (premature ventricular contractions), and Thoracic aortic aneurysm (TAA) (HCC).  PAIN:  Are you having pain? Yes: NPRS scale: 9/10 Pain location: lower back L>R Pain description: achy, stiff  Aggravating factors: standing, walking bending Relieving factors: sitting rest  PRECAUTIONS: None  RED FLAGS: None   WEIGHT BEARING RESTRICTIONS: No  FALLS:  Has patient fallen in last 6 months? No  L PATIENT GOALS: improve pain management    OBJECTIVE:  Note: Objective measures were completed at Evaluation unless otherwise noted.  DIAGNOSTIC FINDINGS:  08/20/2021 IMPRESSION: 1. Progressive diffuse lumbar disc and facet degeneration with severe spinal stenosis at L2-3 and L3-4. 2. Decreased size of L4-5 disc extrusion but with persistent severe left lateral recess stenosis.   COGNITION: Overall cognitive status: Within functional limits for tasks assessed    POSTURE: rounded shoulders, forward head, increased lumbar lordosis, increased thoracic kyphosis, posterior pelvic tilt, and flexed trunk    LUMBAR ROM:   AROM eval  Flexion   Extension   Right lateral flexion   Left lateral flexion   Right rotation   Left rotation    (Blank rows = not tested)  LOWER EXTREMITY ROM:     Active  Right eval Left eval  Hip flexion    Hip extension    Hip abduction    Hip adduction    Hip internal rotation    Hip external rotation    Knee flexion    Knee extension    Ankle dorsiflexion    Ankle plantarflexion    Ankle inversion    Ankle eversion     (Blank rows = not tested)  LOWER EXTREMITY MMT:    MMT Right eval Left eval  Hip flexion    Hip extension    Hip abduction    Hip adduction    Hip internal rotation  Hip external rotation    Knee flexion    Knee extension    Ankle dorsiflexion    Ankle plantarflexion    Ankle inversion    Ankle eversion     (Blank rows = not tested)   FUNCTIONAL TESTS:  5 times sit to stand: 37 sec without UE support 6 minute walk test: Pt unable to finish test. Pt able to ambulate with st. Cane for 560 feet in 4 min 45 sec and then had to stop due to pain in his lower back 10 meter walk test: 0.52 m/s with st. cane ; 0.62 m/s  GAIT: Distance walked: ?990' total Assistive device utilized: Single point cane and Walker - 4 wheeled Level of assistance: SBA   TODAY'S TREATMENT:                                                                                                                              DATE:   Sci Fit level 3 for 8' UE and LE for aerobic conditioning  Soft tissue massage to bil lumbar paraspinalis, quadratus lumborum, cleared bil posterior iliac crest borders  Supine lower trunk rotations: 10 x 5" holds R and L Supine hooklying alternating marching: 10x R and L Supine bil double leg lifts bent knees: 2 x 5 Sit to stand: 10x no UE support   PATIENT EDUCATION:  Education details: see above Person educated: Patient and Spouse Education method: Medical illustrator Education comprehension: verbalized understanding  HOME EXERCISE PROGRAM: Access Code: [email or web address] URL: [email or web address] Date: 08/29/2023 Prepared by: Lavone Nian  Exercises - Sit to Stand with Counter Support  - 1 x daily - 7 x weekly - 1 sets - 10 reps  Access Code: [email or web address] URL: [email or web address] Date: 09/11/2023 Prepared by: Lavone Nian  Exercises - Sit to Stand with Counter Support  - 1 x daily - 7 x weekly - 1 sets - 10 reps - Supine Lower Trunk Rotation  - 1 x daily - 7 x weekly - 10 reps - Supine March  - 1 x daily - 7 x weekly - 2 sets - 10 reps - Bilateral Bent Leg Lift  - 1 x daily - 7 x weekly - 2 sets - 5  reps  ASSESSMENT:  CLINICAL IMPRESSION: Today's session focused on manual therapy for pain management. Pt reported pain reduction to 2-3/10 at end of the session and reported no pain with leaning to R side. We also progressed his exercise program for self management of pain at home  OBJECTIVE IMPAIRMENTS: Abnormal gait, decreased activity tolerance, decreased endurance, decreased mobility, difficulty walking, decreased ROM, decreased strength, hypomobility, increased fascial restrictions, increased muscle spasms, impaired flexibility, postural dysfunction, and pain.   ACTIVITY LIMITATIONS: carrying, lifting, bending, standing, squatting, stairs, and transfers  PARTICIPATION LIMITATIONS: meal prep, cleaning, laundry, shopping, community activity, and yard work  PERSONAL FACTORS: Age and Time since onset of injury/illness/exacerbation are also affecting patient's functional outcome.   REHAB POTENTIAL:  Fair chronic nature of pain  CLINICAL DECISION MAKING: Stable/uncomplicated  EVALUATION COMPLEXITY: Low   GOALS: Goals reviewed with patient? Yes   SHORT TERM GOALS: No STG established   LONG TERM GOALS: Target date: 09/26/2023    Pt will demo 5x sit to stand without UE support in <25 sec to improve overall functional strength Baseline: 37 sec Goal status: INITIAL  2.  Pt will demo gait speed of >0.75 m/s with appropriate AD to improve functional mobility and decrease fall risk Baseline: 0.52 m/s with st. cane; 0.62 m/s Goal status: INITIAL  3.  Pt will demo improve ability to finish 6 minute walk test and be abl e to walk>800 feet with appropriate AD to improve walking endurance Baseline: Pt unable to finish 6 min walk test. Only able to perform 4 min 45 sec and covered 560 feet. Goal status: INITIAL   PLAN:  PT FREQUENCY: 2x/week  PT DURATION: 4 weeks  PLANNED INTERVENTIONS: 97164- PT Re-evaluation, 97110-Therapeutic exercises, 97530- Therapeutic activity, 97112-  Neuromuscular re-education, 97535- Self Care, 84132- Manual therapy, [phone]- Gait training, [phone]- Electrical stimulation (unattended), Patient/Family education, Balance training, Stair training, Joint mobilization, Spinal mobilization, Cryotherapy, and Moist heat.  PLAN FOR NEXT SESSION: practice with rollator, establish HEP   Ileana Ladd, PT 09/11/2023, 10:13 AM

## 2023-09-12 DIAGNOSIS — R2689 Other abnormalities of gait and mobility: Secondary | ICD-10-CM | POA: Insufficient documentation

## 2023-09-12 NOTE — Addendum Note (Signed)
Addended by: Elijah Birk on: 09/12/2023 11:34 PM   Modules accepted: Orders

## 2023-09-14 ENCOUNTER — Ambulatory Visit: Payer: Medicare Other

## 2023-09-14 DIAGNOSIS — G8929 Other chronic pain: Secondary | ICD-10-CM

## 2023-09-14 DIAGNOSIS — M25561 Pain in right knee: Secondary | ICD-10-CM | POA: Diagnosis not present

## 2023-09-14 DIAGNOSIS — M5459 Other low back pain: Secondary | ICD-10-CM | POA: Diagnosis not present

## 2023-09-14 DIAGNOSIS — M25562 Pain in left knee: Secondary | ICD-10-CM | POA: Diagnosis not present

## 2023-09-14 DIAGNOSIS — M6281 Muscle weakness (generalized): Secondary | ICD-10-CM | POA: Diagnosis not present

## 2023-09-14 DIAGNOSIS — G894 Chronic pain syndrome: Secondary | ICD-10-CM | POA: Diagnosis not present

## 2023-09-14 NOTE — Therapy (Signed)
OUTPATIENT PHYSICAL THERAPY THORACOLUMBAR TREATMENT NOTE   Patient Name: Ryan Craig MRN: [id number] DOB:[birth year], 77 y.o., male Today's Date: 09/14/2023  END OF SESSION:  PT End of Session - 09/14/23 1014     Visit Number 3    Number of Visits 11    Date for PT Re-Evaluation 09/26/23    Authorization Type Medicare/AARP    PT Start Time 0930    PT Stop Time 1015    PT Time Calculation (min) 45 min    Activity Tolerance Patient tolerated treatment well    Behavior During Therapy WFL for tasks assessed/performed             Past Medical History:  Diagnosis Date   Anemia    no on any meds   Arthritis    Bifascicular block    Chronic back pain    deteriorating   Hyperlipidemia    Hypertension    Joint pain    Joint swelling    Mild CAD    Myocarditis (HCC)    PAT (paroxysmal atrial tachycardia) (HCC)    Premature atrial contractions    PVC's (premature ventricular contractions)    Thoracic aortic aneurysm (TAA) (HCC)    Past Surgical History:  Procedure Laterality Date   arthroscopic knee surgery Right    COLONOSCOPY     INGUINAL HERNIA REPAIR Bilateral    x 4   JOINT REPLACEMENT Right 6 yrs ago   knee   KNEE ARTHROSCOPY Left 02/24/2014   Procedure: LEFT KNEE ARTHROSCOPY WITH PARTIAL MEDIAL MENISCECTOMY;  Surgeon: [name], MD;  Location: [facility] OR;  Service: Orthopedics;  Laterality: Left;   LEFT HEART CATH AND CORONARY ANGIOGRAPHY N/A 07/22/2020   Procedure: LEFT HEART CATH AND CORONARY ANGIOGRAPHY;  Surgeon: [name], MD;  Location: [facility];  Service: Cardiovascular;  Laterality: N/A;   TOTAL KNEE ARTHROPLASTY Left 12/22/2014   Procedure: LEFT TOTAL KNEE ARTHROPLASTY;  Surgeon: [name], MD;  Location: [facility] OR;  Service: Orthopedics;  Laterality: Left;   Patient Active Problem List   Diagnosis Date Noted   Impaired gait and mobility 09/12/2023   Abnormal glucose 08/28/2023   Need for influenza vaccination  08/28/2023   Family history of bone cancer 08/28/2023   Chronic pain syndrome 08/20/2023   Chronic bilateral low back pain with left-sided sciatica 08/20/2023   Encounter for long-term use of opiate analgesic 08/20/2023   Trochanteric bursitis of right hip 08/20/2023   Herpes zoster vaccination declined 05/30/2023   Tetanus, diphtheria, and acellular pertussis (Tdap) vaccination declined 05/30/2023   Weight loss, abnormal 05/30/2023   Coronary artery disease 04/18/2023   Thoracic aortic aneurysm without rupture, unspecified part (HCC) 12/27/2022   Hemorrhage of rectum and anus 11/27/2022   Class 1 obesity due to excess calories with serious comorbidity and body mass index (BMI) of 31.0 to 31.9 in adult 03/27/2022   HLD (hyperlipidemia) 07/23/2020   Chest pain 07/22/2020   Chest pain of uncertain etiology    Essential hypertension 01/22/2019   Persistent cough 01/22/2019   Right bundle branch block 11/21/2018   Left anterior fascicular block 11/21/2018   Former smoker 11/21/2018   Obesity (BMI 30-39.9) 11/21/2018   Osteoarthritis of left knee 12/22/2014   Status post total left knee replacement 12/22/2014   Acute medial meniscus tear of left knee 02/24/2014    PCP: [name], FNP  REFERRING PROVIDER: Angelina Sheriff, DO  REFERRING DIAG: G89.4 (ICD-10-CM) - Chronic pain syndrome M54.41,G89.29 (ICD-10-CM) -  Chronic bilateral low back pain with right-sided sciatica  Rationale for Evaluation and Treatment: Rehabilitation  THERAPY DIAG:  Other low back pain  Muscle weakness (generalized)  Chronic pain of both knees  ONSET DATE: 08/20/2023  SUBJECTIVE:                                                                                                                                                                                           SUBJECTIVE STATEMENT: Pt reports back felet better. No significant pain right now. He still can't walk a lot without back getting tired and  huring.  PERTINENT HISTORY:  past medical history of Anemia, Arthritis, Bifascicular block, Chronic back pain, Hyperlipidemia, Hypertension, Joint pain, Joint swelling, Mild CAD, Myocarditis (HCC), PAT (paroxysmal atrial tachycardia) (HCC), Premature atrial contractions, PVC's (premature ventricular contractions), and Thoracic aortic aneurysm (TAA) (HCC).  PAIN:  Are you having pain? Yes: NPRS scale: 3/10 Pain location: lower back L>R Pain description: achy, stiff  Aggravating factors: standing, walking bending Relieving factors: sitting rest  PRECAUTIONS: None  RED FLAGS: None   WEIGHT BEARING RESTRICTIONS: No  FALLS:  Has patient fallen in last 6 months? No  L PATIENT GOALS: improve pain management    OBJECTIVE:  Note: Objective measures were completed at Evaluation unless otherwise noted.  DIAGNOSTIC FINDINGS:  08/20/2021 IMPRESSION: 1. Progressive diffuse lumbar disc and facet degeneration with severe spinal stenosis at L2-3 and L3-4. 2. Decreased size of L4-5 disc extrusion but with persistent severe left lateral recess stenosis.   COGNITION: Overall cognitive status: Within functional limits for tasks assessed    POSTURE: rounded shoulders, forward head, increased lumbar lordosis, increased thoracic kyphosis, posterior pelvic tilt, and flexed trunk    LUMBAR ROM:   AROM eval  Flexion   Extension   Right lateral flexion   Left lateral flexion   Right rotation   Left rotation    (Blank rows = not tested)  LOWER EXTREMITY ROM:     Active  Right eval Left eval  Hip flexion    Hip extension    Hip abduction    Hip adduction    Hip internal rotation    Hip external rotation    Knee flexion    Knee extension    Ankle dorsiflexion    Ankle plantarflexion    Ankle inversion    Ankle eversion     (Blank rows = not tested)  LOWER EXTREMITY MMT:    MMT Right eval Left eval  Hip flexion    Hip extension    Hip abduction    Hip adduction     Hip internal rotation  Hip external rotation    Knee flexion    Knee extension    Ankle dorsiflexion    Ankle plantarflexion    Ankle inversion    Ankle eversion     (Blank rows = not tested)   FUNCTIONAL TESTS:  5 times sit to stand: 37 sec without UE support 6 minute walk test: Pt unable to finish test. Pt able to ambulate with st. Cane for 560 feet in 4 min 45 sec and then had to stop due to pain in his lower back 10 meter walk test: 0.52 m/s with st. cane ; 0.62 m/s  GAIT: Distance walked: ?990' total Assistive device utilized: Single point cane and Walker - 4 wheeled Level of assistance: SBA   TODAY'S TREATMENT:                                                                                                                              DATE:   Sci Fit level 3 for 8' UE and LE for aerobic conditioning  Soft tissue massage to bil lumbar paraspinalis, quadratus lumborum, cleared bil posterior iliac crest borders    PATIENT EDUCATION:  Education details: see above Person educated: Patient and Spouse Education method: Medical illustrator Education comprehension: verbalized understanding  HOME EXERCISE PROGRAM: Access Code: [email or web address] URL: [email or web address] Date: 08/29/2023 Prepared by: Lavone Nian  Exercises - Sit to Stand with Counter Support  - 1 x daily - 7 x weekly - 1 sets - 10 reps  Access Code: [email or web address] URL: [email or web address] Date: 09/11/2023 Prepared by: Lavone Nian  Exercises - Sit to Stand with Counter Support  - 1 x daily - 7 x weekly - 1 sets - 10 reps - Supine Lower Trunk Rotation  - 1 x daily - 7 x weekly - 10 reps - Supine March  - 1 x daily - 7 x weekly - 2 sets - 10 reps - Bilateral Bent Leg Lift  - 1 x daily - 7 x weekly - 2 sets - 5 reps  ASSESSMENT:  CLINICAL IMPRESSION: Pt reporting improving pain with manual therapy. Pt reporting  compliance with HEP  OBJECTIVE IMPAIRMENTS: Abnormal  gait, decreased activity tolerance, decreased endurance, decreased mobility, difficulty walking, decreased ROM, decreased strength, hypomobility, increased fascial restrictions, increased muscle spasms, impaired flexibility, postural dysfunction, and pain.   ACTIVITY LIMITATIONS: carrying, lifting, bending, standing, squatting, stairs, and transfers  PARTICIPATION LIMITATIONS: meal prep, cleaning, laundry, shopping, community activity, and yard work  PERSONAL FACTORS: Age and Time since onset of injury/illness/exacerbation are also affecting patient's functional outcome.   REHAB POTENTIAL: Fair chronic nature of pain  CLINICAL DECISION MAKING: Stable/uncomplicated  EVALUATION COMPLEXITY: Low   GOALS: Goals reviewed with patient? Yes   SHORT TERM GOALS: No STG established   LONG TERM GOALS: Target date: 09/26/2023    Pt will demo 5x sit to stand without UE support in <25 sec to improve overall functional strength Baseline: 37 sec Goal status: INITIAL  2.  Pt will demo gait speed of >0.75 m/s with appropriate AD to improve functional mobility and decrease fall risk Baseline: 0.52 m/s with st. cane; 0.62 m/s Goal status: INITIAL  3.  Pt will demo improve ability to finish 6 minute walk test and be abl e to walk>800 feet with appropriate AD to improve walking endurance Baseline: Pt unable to finish 6 min walk test. Only able to perform 4 min 45 sec and covered 560 feet. Goal status: INITIAL   PLAN:  PT FREQUENCY: 2x/week  PT DURATION: 4 weeks  PLANNED INTERVENTIONS: 97164- PT Re-evaluation, 97110-Therapeutic exercises, 97530- Therapeutic activity, 97112- Neuromuscular re-education, 97535- Self Care, 84696- Manual therapy, [phone]- Gait training, [phone]- Electrical stimulation (unattended), Patient/Family education, Balance training, Stair training, Joint mobilization, Spinal mobilization, Cryotherapy, and Moist heat.  PLAN FOR NEXT SESSION: practice with rollator, establish  HEP   Ileana Ladd, PT 09/14/2023, 10:15 AM

## 2023-09-17 ENCOUNTER — Encounter: Payer: Self-pay | Admitting: Registered Nurse

## 2023-09-17 ENCOUNTER — Encounter: Payer: Medicare Other | Attending: Physical Medicine and Rehabilitation | Admitting: Registered Nurse

## 2023-09-17 VITALS — BP 147/73 | HR 74 | Ht 67.0 in | Wt 173.0 lb

## 2023-09-17 DIAGNOSIS — M5442 Lumbago with sciatica, left side: Secondary | ICD-10-CM | POA: Diagnosis not present

## 2023-09-17 DIAGNOSIS — G894 Chronic pain syndrome: Secondary | ICD-10-CM | POA: Diagnosis not present

## 2023-09-17 DIAGNOSIS — Z79891 Long term (current) use of opiate analgesic: Secondary | ICD-10-CM | POA: Diagnosis not present

## 2023-09-17 DIAGNOSIS — G8929 Other chronic pain: Secondary | ICD-10-CM | POA: Diagnosis not present

## 2023-09-17 DIAGNOSIS — Z5181 Encounter for therapeutic drug level monitoring: Secondary | ICD-10-CM | POA: Diagnosis not present

## 2023-09-17 DIAGNOSIS — R2689 Other abnormalities of gait and mobility: Secondary | ICD-10-CM | POA: Diagnosis not present

## 2023-09-17 MED ORDER — HYDROCODONE-ACETAMINOPHEN 5-325 MG PO TABS: 1.0000 | ORAL_TABLET | Freq: Every day | ORAL | 0 refills | Status: DC | PRN

## 2023-09-17 MED ORDER — TRAMADOL HCL 50 MG PO TABS: 50.0000 mg | ORAL_TABLET | Freq: Two times a day (BID) | ORAL | 0 refills | Status: DC

## 2023-09-17 NOTE — Progress Notes (Signed)
Subjective:    Patient ID: Ryan Craig, male    DOB: 11/10/1945, 77 y.o.   MRN: [id number]  HPI: Ryan Craig is a 77 y.o. male who returns for follow up appointment for chronic pain and medication refill. He states his pain is located in his lower back. He rates his pain 8. His current exercise regime is going to physical therapy twice a week and performing stretching exercises.  [name] Morphine equivalent is 15.00 MME.   Last UDS was Performed on 08/20/2023, it was consistent.    Pain Inventory Average Pain 8 Pain Right Now 8 My pain is intermittent  In the last 24 hours, has pain interfered with the following? General activity 10 Relation with others 0 Enjoyment of life 10 What TIME of day is your pain at its worst? evening Sleep (in general) Fair  Pain is worse with: walking and standing Pain improves with: medication Relief from Meds: 8  Family History  Problem Relation Age of Onset   Heart attack Mother    Cancer Maternal Aunt    Cancer Paternal Aunt    Cancer Cousin    Social History   Socioeconomic History   Marital status: Married    Spouse name: Not on file   Number of children: Not on file   Years of education: Not on file   Highest education level: Not on file  Occupational History   Occupation: retired  Tobacco Use   Smoking status: Former    Passive exposure: Past   Smokeless tobacco: Former   Tobacco comments:    quit smoking about 6-7ytrs ago  Advertising account planner   Vaping status: Never Used  Substance and Sexual Activity   Alcohol use: No    Comment: drank over 40 years ago   Drug use: Yes    Types: Hydrocodone   Sexual activity: Not Currently  Other Topics Concern   Not on file  Social History Narrative   Not on file   Social Determinants of Health   Financial Resource Strain: Low Risk  (12/20/2022)   Overall Financial Resource Strain (CARDIA)    Difficulty of Paying Living Expenses: Not hard at all  Food Insecurity: No Food Insecurity  (12/20/2022)   Hunger Vital Sign    Worried About Running Out of Food in the Last Year: Never true    Ran Out of Food in the Last Year: Never true  Transportation Needs: No Transportation Needs (12/20/2022)   PRAPARE - Administrator, Civil Service (Medical): No    Lack of Transportation (Non-Medical): No  Physical Activity: Inactive (12/20/2022)   Exercise Vital Sign    Days of Exercise per Week: 0 days    Minutes of Exercise per Session: 0 min  Stress: No Stress Concern Present (12/20/2022)   [employer] of Occupational Health - Occupational Stress Questionnaire    Feeling of Stress : Not at all  Social Connections: Not on file   Past Surgical History:  Procedure Laterality Date   arthroscopic knee surgery Right    COLONOSCOPY     INGUINAL HERNIA REPAIR Bilateral    x 4   JOINT REPLACEMENT Right 6 yrs ago   knee   KNEE ARTHROSCOPY Left 02/24/2014   Procedure: LEFT KNEE ARTHROSCOPY WITH PARTIAL MEDIAL MENISCECTOMY;  Surgeon: [name], MD;  Location: [facility] OR;  Service: Orthopedics;  Laterality: Left;   LEFT HEART CATH AND CORONARY ANGIOGRAPHY N/A 07/22/2020   Procedure: LEFT HEART CATH AND CORONARY ANGIOGRAPHY;  Surgeon: [name], MD;  Location: [facility] INVASIVE CV LAB;  Service: Cardiovascular;  Laterality: N/A;   TOTAL KNEE ARTHROPLASTY Left 12/22/2014   Procedure: LEFT TOTAL KNEE ARTHROPLASTY;  Surgeon: [name], MD;  Location: [facility];  Service: Orthopedics;  Laterality: Left;   Past Surgical History:  Procedure Laterality Date   arthroscopic knee surgery Right    COLONOSCOPY     INGUINAL HERNIA REPAIR Bilateral    x 4   JOINT REPLACEMENT Right 6 yrs ago   knee   KNEE ARTHROSCOPY Left 02/24/2014   Procedure: LEFT KNEE ARTHROSCOPY WITH PARTIAL MEDIAL MENISCECTOMY;  Surgeon: [name], MD;  Location: [facility] OR;  Service: Orthopedics;  Laterality: Left;   LEFT HEART CATH AND CORONARY ANGIOGRAPHY N/A 07/22/2020   Procedure:  LEFT HEART CATH AND CORONARY ANGIOGRAPHY;  Surgeon: [name], MD;  Location: [facility] INVASIVE CV LAB;  Service: Cardiovascular;  Laterality: N/A;   TOTAL KNEE ARTHROPLASTY Left 12/22/2014   Procedure: LEFT TOTAL KNEE ARTHROPLASTY;  Surgeon: [name], MD;  Location: [facility] OR;  Service: Orthopedics;  Laterality: Left;   Past Medical History:  Diagnosis Date   Anemia    no on any meds   Arthritis    Bifascicular block    Chronic back pain    deteriorating   Hyperlipidemia    Hypertension    Joint pain    Joint swelling    Mild CAD    Myocarditis (HCC)    PAT (paroxysmal atrial tachycardia) (HCC)    Premature atrial contractions    PVC's (premature ventricular contractions)    Thoracic aortic aneurysm (TAA) (HCC)    Ht 5\' 7"  (1.702 m)   Wt 175 lb (79.4 kg)   BMI 27.41 kg/m   Opioid Risk Score:   Fall Risk Score:  `1  Depression screen [facility] 2/9     08/28/2023    8:53 AM 08/20/2023   10:59 AM 12/27/2022    8:27 AM 12/20/2022    8:32 AM 12/07/2021    8:33 AM 11/25/2020    9:33 AM 10/29/2019    9:22 AM  Depression screen PHQ 2/9  Decreased Interest 1 3 0 0 0 0 0  Down, Depressed, Hopeless 1 3 0 0 0 0 0  PHQ - 2 Score 2 6 0 0 0 0 0  Altered sleeping 0 3     0  Tired, decreased energy 1 2     0  Change in appetite 3 3     0  Feeling bad or failure about yourself  1 3     0  Trouble concentrating 0 2     0  Moving slowly or fidgety/restless 0 0     0  Suicidal thoughts 0 0     0  PHQ-9 Score 7 19     0  Difficult doing work/chores Not difficult at all      Not difficult at all      Review of Systems  Musculoskeletal:  Positive for back pain and gait problem.      Objective:   Physical Exam Vitals and nursing note reviewed.  Constitutional:      Appearance: Normal appearance.  Cardiovascular:     Rate and Rhythm: Normal rate and regular rhythm.     Pulses: Normal pulses.     Heart sounds: Normal heart sounds.  Pulmonary:     Effort: Pulmonary  effort is normal.     Breath sounds: Normal  breath sounds.  Musculoskeletal:     Comments: Normal Muscle Bulk and Muscle Testing Reveals:  Upper Extremities:Right: Upper Extremity: Full  ROM and Muscle Strength 5/5 Left Upper Extremity: Decreased ROM 90 Degrees  and Muscle Strength 5/5  Lumbar Hypersensitivity Lower Extremities: Full ROM and Muscle Strength 5/5 Arises from Table slowly using cane for support Narrow Based Gait    Skin:    General: Skin is warm and dry.  Neurological:     Mental Status: He is alert and oriented to person, place, and time.  Psychiatric:        Mood and Affect: Mood normal.        Behavior: Behavior normal.         Assessment & Plan:  Chronic Bilateral low back pain with left sided sciatica: Continue Physical Therapy. Continue to Monitor.  Impaired Gait Mobility: Continue Physical Therapy, continue to use assistive device with his cane.  Chronic Pain Syndrome: Refilled: Hydrocodone 5/325 mg daily and Tramadol 50 mg one tablet twice a day as needed for pain.#60.  We will continue the opioid monitoring program, this consists of regular clinic visits, examinations, urine drug screen, pill counts as well as use of [place] Controlled Substance Reporting system. A 12 month History has been reviewed on the [place] Controlled Substance Reporting System on 09/17/2023  F/U in 3 weeks

## 2023-09-18 ENCOUNTER — Ambulatory Visit: Payer: Medicare Other | Attending: Physician Assistant

## 2023-09-18 ENCOUNTER — Ambulatory Visit: Payer: Medicare Other

## 2023-09-18 DIAGNOSIS — M5459 Other low back pain: Secondary | ICD-10-CM

## 2023-09-18 DIAGNOSIS — M25561 Pain in right knee: Secondary | ICD-10-CM | POA: Diagnosis not present

## 2023-09-18 DIAGNOSIS — G894 Chronic pain syndrome: Secondary | ICD-10-CM | POA: Diagnosis not present

## 2023-09-18 DIAGNOSIS — Z01810 Encounter for preprocedural cardiovascular examination: Secondary | ICD-10-CM

## 2023-09-18 DIAGNOSIS — G8929 Other chronic pain: Secondary | ICD-10-CM | POA: Diagnosis not present

## 2023-09-18 DIAGNOSIS — M25562 Pain in left knee: Secondary | ICD-10-CM | POA: Diagnosis not present

## 2023-09-18 DIAGNOSIS — M6281 Muscle weakness (generalized): Secondary | ICD-10-CM

## 2023-09-18 NOTE — Progress Notes (Signed)
Virtual Visit via Telephone Note   Because of Ryan Craig co-morbid illnesses, he is at least at moderate risk for complications without adequate follow up.  This format is felt to be most appropriate for this patient at this time.  The patient did not have access to video technology/had technical difficulties with video requiring transitioning to audio format only (telephone).  All issues noted in this document were discussed and addressed.  No physical exam could be performed with this format.  Please refer to the patient's chart for his consent to telehealth for [facility].  Evaluation Performed:  Preoperative cardiovascular risk assessment _____________   Date:  09/18/2023   Patient ID:  Ryan Craig, DOB [birth year], MRN [id number] Patient Location:  Home Provider location:   Office  Primary Care Provider:  Arnette Craig, Ryan Craig Primary Cardiologist:  Ryan Schultz, MD  Chief Complaint / Patient Profile   77 y.o. y/o male with a h/o nonobstructive CAD on prior cardiac catheterization 2021 with 20% lesion, cardiac MRI with myocarditis which is now resolved, mild AR and trivial MR, hyperlipidemia, dilated sinus of Valsalva who is pending colonoscopy and presents today for telephonic preoperative cardiovascular risk assessment.  History of Present Illness    Ryan Craig is a 77 y.o. male who presents via audio/video conferencing for a telehealth visit today.  Pt was last seen in cardiology clinic on 04/17/2023 by Dr. Anne Craig.  At that time Ryan Craig was doing well .  The patient is now pending procedure as outlined above. Since his last visit, he tells me he has not had any issues with his heart.  Every now and then he used to get a sharp pain in his chest but has not happened in a while.  Some shortness of breath which occurs when he is walking around the grocery store but this is usual for him.  No new shortness of breath.  No palpitations.  He does meet minimal METS on  the DASI.   Per office protocol, if patient is without any new symptoms or concerns at the time of their virtual visit, he/she may hold ASA for 7 days prior to procedure. Please resume ASA as soon as possible postprocedure, at the discretion of the surgeon.    Reports no shortness of breath nor dyspnea on exertion. No edema, orthopnea, PND. Reports no palpitations.   Past Medical History    Past Medical History:  Diagnosis Date   Anemia    no on any meds   Arthritis    Bifascicular block    Chronic back pain    deteriorating   Hyperlipidemia    Hypertension    Joint pain    Joint swelling    Mild CAD    Myocarditis (HCC)    PAT (paroxysmal atrial tachycardia) (HCC)    Premature atrial contractions    PVC's (premature ventricular contractions)    Thoracic aortic aneurysm (TAA) (HCC)    Past Surgical History:  Procedure Laterality Date   arthroscopic knee surgery Right    COLONOSCOPY     INGUINAL HERNIA REPAIR Bilateral    x 4   JOINT REPLACEMENT Right 6 yrs ago   knee   KNEE ARTHROSCOPY Left 02/24/2014   Procedure: LEFT KNEE ARTHROSCOPY WITH PARTIAL MEDIAL MENISCECTOMY;  Surgeon: Ryan Hitch, MD;  Location: [facility] OR;  Service: Orthopedics;  Laterality: Left;   LEFT HEART CATH AND CORONARY ANGIOGRAPHY N/A 07/22/2020   Procedure: LEFT HEART CATH AND CORONARY ANGIOGRAPHY;  Surgeon: Ryan Craig,  Ryan Dear, MD;  Location: [facility] INVASIVE CV LAB;  Service: Cardiovascular;  Laterality: N/A;   TOTAL KNEE ARTHROPLASTY Left 12/22/2014   Procedure: LEFT TOTAL KNEE ARTHROPLASTY;  Surgeon: Ryan Hitch, MD;  Location: [facility] OR;  Service: Orthopedics;  Laterality: Left;    Allergies  Allergies  Allergen Reactions   Contrast Media [Iodinated Contrast Media] Anaphylaxis   Latex Rash    Home Medications    Prior to Admission medications   Medication Sig Start Date End Date Taking? Authorizing Provider  acetaminophen (TYLENOL) 500 MG tablet Take 500 mg by mouth every 6  (six) hours as needed.    [provider]  amLODipine (NORVASC) 10 MG tablet Take 1 tablet (10 mg total) by mouth daily. 03/07/23   Ryan Craig, Ryan Craig  aspirin EC 81 MG tablet Take 81 mg by mouth daily.    [provider]  BLACK CURRANT SEED OIL PO Take by mouth.    [provider]  Cholecalciferol (VITAMIN D-3 PO) Take 2 tablets by mouth daily.     [provider]  HYDROcodone-acetaminophen (NORCO) 5-325 MG tablet Take 1 tablet by mouth daily as needed for severe pain (pain score 7-10). Use as needed once daily for breakthrough pain; you are only receiving 15 tablets for this month and are responsible for budgeting them; early refills of this medication will not be permitted. 09/17/23   Ryan Bales, NP  lisinopril (ZESTRIL) 10 MG tablet TAKE 1 TABLET BY MOUTH EVERY DAY 05/14/23   Ryan Craig, Ryan Craig  methylPREDNISolone (MEDROL) 4 MG tablet Take as directed 07/02/23   Ryan Bouchard, PA-C  metoprolol succinate (TOPROL-XL) 25 MG 24 hr tablet TAKE 1 TABLET BY MOUTH EVERY DAY (NEED OFFIE VISIT) 05/14/23   Ryan Bathe, MD  rosuvastatin (CRESTOR) 40 MG tablet TAKE 1 TABLET BY MOUTH EVERY DAY 03/02/23   Ryan Bathe, MD  traMADol (ULTRAM) 50 MG tablet Take 1 tablet (50 mg total) by mouth 2 (two) times daily. 09/17/23 09/16/24  Ryan Bales, NP  triamcinolone cream (KENALOG) 0.1 % APPLY TOPICALLY A THIN LAYER TO THE AFFECTED AREA(S) TWICE A DAY 07/25/23   Ryan Craig, Ryan Craig  vitamin B-12 (CYANOCOBALAMIN) 100 MCG tablet Take 1 tablet (100 mcg total) by mouth daily. 12/27/22 12/27/23  Ryan Craig, Ryan Craig    Physical Exam    Vital Signs:  Ryan Craig does not have vital signs available for review today.  Given telephonic nature of communication, physical exam is limited. AAOx3. NAD. Normal affect.  Speech and respirations are unlabored.  Accessory Clinical Findings    None  Assessment & Plan    1.  Preoperative Cardiovascular Risk Assessment:  Ryan Craig  perioperative risk of a major cardiac event is 0.9% according to the Revised Cardiac Risk Index (RCRI).  Therefore, he is at low risk for perioperative complications.   His functional capacity is good at 5.62 METs according to the [place] Activity Status Index (DASI). Recommendations: According to ACC/AHA guidelines, no further cardiovascular testing needed.  The patient may proceed to surgery at acceptable risk.   Antiplatelet and/or Anticoagulation Recommendations: Aspirin can be held for 5-7 days prior to his surgery.  Please resume Aspirin post operatively when it is felt to be safe from a bleeding standpoint.    The patient was advised that if he develops new symptoms prior to surgery to contact our office to arrange for a follow-up visit, and he verbalized understanding.   A copy of this note will  be routed to requesting surgeon.  Time:   Today, I have spent 6 minutes with the patient with telehealth technology discussing medical history, symptoms, and management plan.     Sharlene Dory, PA-C  09/18/2023, 1:43 PM

## 2023-09-18 NOTE — Therapy (Signed)
OUTPATIENT PHYSICAL THERAPY THORACOLUMBAR TREATMENT NOTE   Patient Name: Ryan Craig MRN: [id number] DOB:October 31, 1945, 77 y.o., male Today's Date: 09/18/2023  END OF SESSION:  PT End of Session - 09/18/23 0939     Visit Number 4    Number of Visits 11    Date for PT Re-Evaluation 09/26/23    Authorization Type Medicare/AARP    PT Start Time 0930    PT Stop Time 1015    PT Time Calculation (min) 45 min    Activity Tolerance Patient tolerated treatment well    Behavior During Therapy WFL for tasks assessed/performed             Past Medical History:  Diagnosis Date   Anemia    no on any meds   Arthritis    Bifascicular block    Chronic back pain    deteriorating   Hyperlipidemia    Hypertension    Joint pain    Joint swelling    Mild CAD    Myocarditis (HCC)    PAT (paroxysmal atrial tachycardia) (HCC)    Premature atrial contractions    PVC's (premature ventricular contractions)    Thoracic aortic aneurysm (TAA) (HCC)    Past Surgical History:  Procedure Laterality Date   arthroscopic knee surgery Right    COLONOSCOPY     INGUINAL HERNIA REPAIR Bilateral    x 4   JOINT REPLACEMENT Right 6 yrs ago   knee   KNEE ARTHROSCOPY Left 02/24/2014   Procedure: LEFT KNEE ARTHROSCOPY WITH PARTIAL MEDIAL MENISCECTOMY;  Surgeon: [name], MD;  Location: [facility] OR;  Service: Orthopedics;  Laterality: Left;   LEFT HEART CATH AND CORONARY ANGIOGRAPHY N/A 07/22/2020   Procedure: LEFT HEART CATH AND CORONARY ANGIOGRAPHY;  Surgeon: [name], MD;  Location: [facility];  Service: Cardiovascular;  Laterality: N/A;   TOTAL KNEE ARTHROPLASTY Left 12/22/2014   Procedure: LEFT TOTAL KNEE ARTHROPLASTY;  Surgeon: [name], MD;  Location: [facility] OR;  Service: Orthopedics;  Laterality: Left;   Patient Active Problem List   Diagnosis Date Noted   Impaired gait and mobility 09/12/2023   Abnormal glucose 08/28/2023   Need for influenza vaccination  08/28/2023   Family history of bone cancer 08/28/2023   Chronic pain syndrome 08/20/2023   Chronic bilateral low back pain with left-sided sciatica 08/20/2023   Encounter for long-term use of opiate analgesic 08/20/2023   Trochanteric bursitis of right hip 08/20/2023   Herpes zoster vaccination declined 05/30/2023   Tetanus, diphtheria, and acellular pertussis (Tdap) vaccination declined 05/30/2023   Weight loss, abnormal 05/30/2023   Coronary artery disease 04/18/2023   Thoracic aortic aneurysm without rupture, unspecified part (HCC) 12/27/2022   Hemorrhage of rectum and anus 11/27/2022   Class 1 obesity due to excess calories with serious comorbidity and body mass index (BMI) of 31.0 to 31.9 in adult 03/27/2022   HLD (hyperlipidemia) 07/23/2020   Chest pain 07/22/2020   Chest pain of uncertain etiology    Essential hypertension 01/22/2019   Persistent cough 01/22/2019   Right bundle branch block 11/21/2018   Left anterior fascicular block 11/21/2018   Former smoker 11/21/2018   Obesity (BMI 30-39.9) 11/21/2018   Osteoarthritis of left knee 12/22/2014   Status post total left knee replacement 12/22/2014   Acute medial meniscus tear of left knee 02/24/2014    PCP: [name], FNP  REFERRING PROVIDER: Angelina Sheriff, DO  REFERRING DIAG: G89.4 (ICD-10-CM) - Chronic pain syndrome M54.41,G89.29 (ICD-10-CM) -  Chronic bilateral low back pain with right-sided sciatica  Rationale for Evaluation and Treatment: Rehabilitation  THERAPY DIAG:  Other low back pain  Muscle weakness (generalized)  ONSET DATE: 08/20/2023  SUBJECTIVE:                                                                                                                                                                                           SUBJECTIVE STATEMENT: Pt reports that he was sore from manual therapy for couple of days after last time.   PERTINENT HISTORY:  past medical history of Anemia,  Arthritis, Bifascicular block, Chronic back pain, Hyperlipidemia, Hypertension, Joint pain, Joint swelling, Mild CAD, Myocarditis (HCC), PAT (paroxysmal atrial tachycardia) (HCC), Premature atrial contractions, PVC's (premature ventricular contractions), and Thoracic aortic aneurysm (TAA) (HCC).  PAIN:  Are you having pain? Yes: NPRS scale: 3/10 Pain location: lower back L>R Pain description: achy, stiff  Aggravating factors: standing, walking bending Relieving factors: sitting rest  PRECAUTIONS: None  RED FLAGS: None   WEIGHT BEARING RESTRICTIONS: No  FALLS:  Has patient fallen in last 6 months? No  L PATIENT GOALS: improve pain management    OBJECTIVE:  Note: Objective measures were completed at Evaluation unless otherwise noted.  DIAGNOSTIC FINDINGS:  08/20/2021 IMPRESSION: 1. Progressive diffuse lumbar disc and facet degeneration with severe spinal stenosis at L2-3 and L3-4. 2. Decreased size of L4-5 disc extrusion but with persistent severe left lateral recess stenosis.   COGNITION: Overall cognitive status: Within functional limits for tasks assessed    POSTURE: rounded shoulders, forward head, increased lumbar lordosis, increased thoracic kyphosis, posterior pelvic tilt, and flexed trunk    LUMBAR ROM:   AROM eval  Flexion   Extension   Right lateral flexion   Left lateral flexion   Right rotation   Left rotation    (Blank rows = not tested)  LOWER EXTREMITY ROM:     Active  Right eval Left eval  Hip flexion    Hip extension    Hip abduction    Hip adduction    Hip internal rotation    Hip external rotation    Knee flexion    Knee extension    Ankle dorsiflexion    Ankle plantarflexion    Ankle inversion    Ankle eversion     (Blank rows = not tested)  LOWER EXTREMITY MMT:    MMT Right eval Left eval  Hip flexion    Hip extension    Hip abduction    Hip adduction    Hip internal rotation    Hip external rotation    Knee flexion  Knee extension    Ankle dorsiflexion    Ankle plantarflexion    Ankle inversion    Ankle eversion     (Blank rows = not tested)   FUNCTIONAL TESTS:  5 times sit to stand: 37 sec without UE support 6 minute walk test: Pt unable to finish test. Pt able to ambulate with st. Cane for 560 feet in 4 min 45 sec and then had to stop due to pain in his lower back 10 meter walk test: 0.52 m/s with st. cane ; 0.62 m/s  GAIT: Distance walked: ?990' total Assistive device utilized: Single point cane and Walker - 4 wheeled Level of assistance: SBA   TODAY'S TREATMENT:                                                                                                                              DATE:   Sci Fit level 4 for 8' UE and LE for aerobic conditioning Pt and wife educated on potentially discussing with MD for another epidural injection in his back for improved pain management. Pt is feeling better with PT and there is a potential that another epidural injection may help to keep his pain at manageable level for longer periods of time.   Supine hooklying lower trunk rotations: 2 x 10 Supine hooklying alternating marching: 2 x 10 Supine hooklying double leg lifts: 3 x 5   6 minute walk test: rolling walker: 605' total distance, pt needed one seated break on rollator for 30 sec due to fatigue. After the walking, pt's pain was 7/10.   PATIENT EDUCATION:  Education details: see above Person educated: Patient and Spouse Education method: Medical illustrator Education comprehension: verbalized understanding  HOME EXERCISE PROGRAM: Access Code: [email or web address] URL: [email or web address] Date: 08/29/2023 Prepared by: Lavone Nian  Exercises - Sit to Stand with Counter Support  - 1 x daily - 7 x weekly - 1 sets - 10 reps  Access Code: [email or web address] URL: [email or web address] Date: 09/11/2023 Prepared by: Lavone Nian  Exercises - Sit to Stand with  Counter Support  - 1 x daily - 7 x weekly - 1 sets - 10 reps - Supine Lower Trunk Rotation  - 1 x daily - 7 x weekly - 10 reps - Supine March  - 1 x daily - 7 x weekly - 2 sets - 10 reps - Bilateral Bent Leg Lift  - 1 x daily - 7 x weekly - 2 sets - 5 reps  ASSESSMENT:  CLINICAL IMPRESSION: Today's session focused on reviewing HEP for compliance and for improved pain management. Pt demo improved walking endurance with rolling walker compared to cane. Patient will benefit from rolling walker to improve his ability to walk longer distances and with improved pain management.    OBJECTIVE IMPAIRMENTS: Abnormal gait, decreased activity tolerance, decreased endurance, decreased mobility, difficulty walking, decreased ROM, decreased strength, hypomobility, increased fascial restrictions, increased muscle spasms, impaired flexibility, postural dysfunction, and pain.  ACTIVITY LIMITATIONS: carrying, lifting, bending, standing, squatting, stairs, and transfers  PARTICIPATION LIMITATIONS: meal prep, cleaning, laundry, shopping, community activity, and yard work  PERSONAL FACTORS: Age and Time since onset of injury/illness/exacerbation are also affecting patient's functional outcome.   REHAB POTENTIAL: Fair chronic nature of pain  CLINICAL DECISION MAKING: Stable/uncomplicated  EVALUATION COMPLEXITY: Low   GOALS: Goals reviewed with patient? Yes   SHORT TERM GOALS: No STG established   LONG TERM GOALS: Target date: 09/26/2023    Pt will demo 5x sit to stand without UE support in <25 sec to improve overall functional strength Baseline: 37 sec Goal status: INITIAL  2.  Pt will demo gait speed of >0.75 m/s with appropriate AD to improve functional mobility and decrease fall risk Baseline: 0.52 m/s with st. cane; 0.62 m/s;  Goal status: INITIAL  3.  Pt will demo improve ability to finish 6 minute walk test and be abl e to walk>800 feet with appropriate AD to improve walking  endurance Baseline: Pt unable to finish 6 min walk test. Only able to perform 4 min 45 sec and covered 560 feet; 605 feet with rollator in 6 minutes with 1 seated break on rollator for 30 sec. (09/18/23) Goal status: Progressing   PLAN:  PT FREQUENCY: 2x/week  PT DURATION: 4 weeks  PLANNED INTERVENTIONS: 97164- PT Re-evaluation, 97110-Therapeutic exercises, 97530- Therapeutic activity, 97112- Neuromuscular re-education, 97535- Self Care, 82956- Manual therapy, [phone]- Gait training, [phone]- Electrical stimulation (unattended), Patient/Family education, Balance training, Stair training, Joint mobilization, Spinal mobilization, Cryotherapy, and Moist heat.  PLAN FOR NEXT SESSION: Do 6 minute walk test, Review goals, cancel/reschedule 12/6 appt   Ileana Ladd, PT 09/18/2023, 9:40 AM

## 2023-09-25 ENCOUNTER — Ambulatory Visit: Payer: Medicare Other

## 2023-09-25 ENCOUNTER — Encounter: Payer: Self-pay | Admitting: Registered Nurse

## 2023-09-28 ENCOUNTER — Ambulatory Visit: Payer: Medicare Other

## 2023-10-02 DIAGNOSIS — K562 Volvulus: Secondary | ICD-10-CM | POA: Diagnosis not present

## 2023-10-02 DIAGNOSIS — D120 Benign neoplasm of cecum: Secondary | ICD-10-CM | POA: Diagnosis not present

## 2023-10-02 DIAGNOSIS — R634 Abnormal weight loss: Secondary | ICD-10-CM | POA: Diagnosis not present

## 2023-10-02 DIAGNOSIS — Z860101 Personal history of adenomatous and serrated colon polyps: Secondary | ICD-10-CM | POA: Diagnosis not present

## 2023-10-02 DIAGNOSIS — K635 Polyp of colon: Secondary | ICD-10-CM | POA: Diagnosis not present

## 2023-10-02 DIAGNOSIS — K5730 Diverticulosis of large intestine without perforation or abscess without bleeding: Secondary | ICD-10-CM | POA: Diagnosis not present

## 2023-10-02 DIAGNOSIS — D122 Benign neoplasm of ascending colon: Secondary | ICD-10-CM | POA: Diagnosis not present

## 2023-10-02 DIAGNOSIS — K6389 Other specified diseases of intestine: Secondary | ICD-10-CM | POA: Diagnosis not present

## 2023-10-02 LAB — HM COLONOSCOPY

## 2023-10-04 NOTE — Progress Notes (Signed)
Subjective:    Patient ID: Ryan Craig, male    DOB: 1946-03-13, 77 y.o.   MRN: [id number]  HPI: Ryan Craig is a 77 y.o. male who returns for follow up appointment for chronic pain and medication refill. He states his pain is located in his lower back and bilateral knee pain. He rates his pain 8. His current exercise regime is walking and performing stretching exercises.  Ryan Craig reports on Saturday 10/06/2023 he was vacuuming under his bed, when he hit the bed rail and fell forward, he reports he landed on his right shoulder. He was able to pick himself up, he didn't seek medical attention. He was educated on falls prevention he verbalizes understanding.    Ryan Craig Morphine equivalent is 25.00 MME.   Last UDS was Performed on 08/20/2023, it was consistent.      Pain Inventory Average Pain 8 Pain Right Now 8 My pain is intermittent and aching  In the last 24 hours, has pain interfered with the following? General activity 8 Relation with others 1 Enjoyment of life 0 What TIME of day is your pain at its worst? morning  Sleep (in general) Good  Pain is worse with: walking and standing Pain improves with: rest and medication Relief from Meds: 9  Family History  Problem Relation Age of Onset   Heart attack Mother    Cancer Maternal Aunt    Cancer Paternal Aunt    Cancer Cousin    Social History   Socioeconomic History   Marital status: Married    Spouse name: Not on file   Number of children: Not on file   Years of education: Not on file   Highest education level: Not on file  Occupational History   Occupation: retired  Tobacco Use   Smoking status: Former    Passive exposure: Past   Smokeless tobacco: Former   Tobacco comments:    quit smoking about 6-7ytrs ago  Advertising account planner   Vaping status: Never Used  Substance and Sexual Activity   Alcohol use: No    Comment: drank over 40 years ago   Drug use: Yes    Types: Hydrocodone   Sexual activity: Not  Currently  Other Topics Concern   Not on file  Social History Narrative   Not on file   Social Drivers of Health   Financial Resource Strain: Low Risk  (12/20/2022)   Overall Financial Resource Strain (CARDIA)    Difficulty of Paying Living Expenses: Not hard at all  Food Insecurity: No Food Insecurity (12/20/2022)   Hunger Vital Sign    Worried About Running Out of Food in the Last Year: Never true    Ran Out of Food in the Last Year: Never true  Transportation Needs: No Transportation Needs (12/20/2022)   PRAPARE - Administrator, Civil Service (Medical): No    Lack of Transportation (Non-Medical): No  Physical Activity: Inactive (12/20/2022)   Exercise Vital Sign    Days of Exercise per Week: 0 days    Minutes of Exercise per Session: 0 min  Stress: No Stress Concern Present (12/20/2022)   [employer] of Occupational Health - Occupational Stress Questionnaire    Feeling of Stress : Not at all  Social Connections: Not on file   Past Surgical History:  Procedure Laterality Date   arthroscopic knee surgery Right    COLONOSCOPY     INGUINAL HERNIA REPAIR Bilateral    x 4   JOINT REPLACEMENT  Right 6 yrs ago   knee   KNEE ARTHROSCOPY Left 02/24/2014   Procedure: LEFT KNEE ARTHROSCOPY WITH PARTIAL MEDIAL MENISCECTOMY;  Surgeon: [name], MD;  Location: [facility] OR;  Service: Orthopedics;  Laterality: Left;   LEFT HEART CATH AND CORONARY ANGIOGRAPHY N/A 07/22/2020   Procedure: LEFT HEART CATH AND CORONARY ANGIOGRAPHY;  Surgeon: [name], MD;  Location: [facility];  Service: Cardiovascular;  Laterality: N/A;   TOTAL KNEE ARTHROPLASTY Left 12/22/2014   Procedure: LEFT TOTAL KNEE ARTHROPLASTY;  Surgeon: [name], MD;  Location: [facility] OR;  Service: Orthopedics;  Laterality: Left;   Past Surgical History:  Procedure Laterality Date   arthroscopic knee surgery Right    COLONOSCOPY     INGUINAL HERNIA REPAIR Bilateral    x 4    JOINT REPLACEMENT Right 6 yrs ago   knee   KNEE ARTHROSCOPY Left 02/24/2014   Procedure: LEFT KNEE ARTHROSCOPY WITH PARTIAL MEDIAL MENISCECTOMY;  Surgeon: [name], MD;  Location: [facility] OR;  Service: Orthopedics;  Laterality: Left;   LEFT HEART CATH AND CORONARY ANGIOGRAPHY N/A 07/22/2020   Procedure: LEFT HEART CATH AND CORONARY ANGIOGRAPHY;  Surgeon: [name], MD;  Location: [facility];  Service: Cardiovascular;  Laterality: N/A;   TOTAL KNEE ARTHROPLASTY Left 12/22/2014   Procedure: LEFT TOTAL KNEE ARTHROPLASTY;  Surgeon: [name], MD;  Location: [facility] OR;  Service: Orthopedics;  Laterality: Left;   Past Medical History:  Diagnosis Date   Anemia    no on any meds   Arthritis    Bifascicular block    Chronic back pain    deteriorating   Hyperlipidemia    Hypertension    Joint pain    Joint swelling    Mild CAD    Myocarditis (HCC)    PAT (paroxysmal atrial tachycardia) (HCC)    Premature atrial contractions    PVC's (premature ventricular contractions)    Thoracic aortic aneurysm (TAA) (HCC)    BP 121/75   Pulse 65   Ht 5\' 7"  (1.702 m)   Wt 175 lb (79.4 kg)   SpO2 97%   BMI 27.41 kg/m   Opioid Risk Score:   Fall Risk Score:  `1  Depression screen [facility] 2/9     10/08/2023    9:01 AM 08/28/2023    8:53 AM 08/20/2023   10:59 AM 12/27/2022    8:27 AM 12/20/2022    8:32 AM 12/07/2021    8:33 AM 11/25/2020    9:33 AM  Depression screen PHQ 2/9  Decreased Interest 0 1 3 0 0 0 0  Down, Depressed, Hopeless 0 1 3 0 0 0 0  PHQ - 2 Score 0 2 6 0 0 0 0  Altered sleeping  0 3      Tired, decreased energy  1 2      Change in appetite  3 3      Feeling bad or failure about yourself   1 3      Trouble concentrating  0 2      Moving slowly or fidgety/restless  0 0      Suicidal thoughts  0 0      PHQ-9 Score  7 19      Difficult doing work/chores  Not difficult at all         Review of Systems  Musculoskeletal:        PAIN BOTH KNEES   All other systems reviewed  and are negative.      Objective:   Physical Exam Vitals and nursing note reviewed.  Constitutional:      Appearance: Normal appearance.  Cardiovascular:     Rate and Rhythm: Normal rate and regular rhythm.     Pulses: Normal pulses.     Heart sounds: Normal heart sounds.  Pulmonary:     Effort: Pulmonary effort is normal.     Breath sounds: Normal breath sounds.  Musculoskeletal:     Comments: Normal Muscle Bulk and Muscle Testing Reveals:  Upper Extremities: Right: Decreased ROM  90 Degrees and Muscle Strength 5/5 Right AC Joint Tenderness Left Upper Extremity: Full ROM and Muscle Strength 5/5  Lower Extremities: Right: Decreased ROM and Muscle Strength 5/5 Right Lower Extremity Flexion Produces Pain into his Right Patella  Left Lower Extremity: Full ROM and Muscle Strength 5/5 Arises from Table Slowly using cane for support Antalgic Gait     Skin:    General: Skin is warm and dry.  Neurological:     Mental Status: He is alert and oriented to person, place, and time.  Psychiatric:        Mood and Affect: Mood normal.        Behavior: Behavior normal.         Assessment & Plan:  Chronic Bilateral low back pain with left sided sciatica: Continue Physical Therapy. Continue to Monitor. 10/08/2023 Impaired Gait Mobility: Continue Physical Therapy, continue to use assistive device with his cane. 10/08/2023 Chronic Pain Syndrome: Refilled: Hydrocodone 5/325 mg daily, using it sparingly and Tramadol 50 mg one tablet twice a day as needed for pain.#60.  We will continue the opioid monitoring program, this consists of regular clinic visits, examinations, urine drug screen, pill counts as well as use of [place] Controlled Substance Reporting system. A 12 month History has been reviewed on the [place] Controlled Substance Reporting System on 10/08/2023   F/U in 2 months

## 2023-10-08 ENCOUNTER — Encounter: Payer: Medicare Other | Attending: Physical Medicine and Rehabilitation | Admitting: Registered Nurse

## 2023-10-08 ENCOUNTER — Encounter: Payer: Self-pay | Admitting: Registered Nurse

## 2023-10-08 VITALS — BP 121/75 | HR 65 | Ht 67.0 in | Wt 175.0 lb

## 2023-10-08 DIAGNOSIS — G894 Chronic pain syndrome: Secondary | ICD-10-CM | POA: Diagnosis not present

## 2023-10-08 DIAGNOSIS — R2689 Other abnormalities of gait and mobility: Secondary | ICD-10-CM

## 2023-10-08 DIAGNOSIS — Z5181 Encounter for therapeutic drug level monitoring: Secondary | ICD-10-CM

## 2023-10-08 DIAGNOSIS — M25561 Pain in right knee: Secondary | ICD-10-CM | POA: Diagnosis not present

## 2023-10-08 DIAGNOSIS — R269 Unspecified abnormalities of gait and mobility: Secondary | ICD-10-CM | POA: Insufficient documentation

## 2023-10-08 DIAGNOSIS — M25562 Pain in left knee: Secondary | ICD-10-CM | POA: Insufficient documentation

## 2023-10-08 DIAGNOSIS — G8929 Other chronic pain: Secondary | ICD-10-CM

## 2023-10-08 DIAGNOSIS — Y92009 Unspecified place in unspecified non-institutional (private) residence as the place of occurrence of the external cause: Secondary | ICD-10-CM | POA: Diagnosis not present

## 2023-10-08 DIAGNOSIS — M5442 Lumbago with sciatica, left side: Secondary | ICD-10-CM | POA: Diagnosis not present

## 2023-10-08 DIAGNOSIS — Z79891 Long term (current) use of opiate analgesic: Secondary | ICD-10-CM

## 2023-10-08 DIAGNOSIS — W19XXXD Unspecified fall, subsequent encounter: Secondary | ICD-10-CM | POA: Diagnosis not present

## 2023-10-08 DIAGNOSIS — M1712 Unilateral primary osteoarthritis, left knee: Secondary | ICD-10-CM

## 2023-10-08 DIAGNOSIS — Z760 Encounter for issue of repeat prescription: Secondary | ICD-10-CM | POA: Insufficient documentation

## 2023-10-08 MED ORDER — TRAMADOL HCL 50 MG PO TABS: 50.0000 mg | ORAL_TABLET | Freq: Two times a day (BID) | ORAL | 3 refills | Status: DC

## 2023-10-08 MED ORDER — HYDROCODONE-ACETAMINOPHEN 5-325 MG PO TABS: 1.0000 | ORAL_TABLET | Freq: Every day | ORAL | 0 refills | Status: DC | PRN

## 2023-10-09 ENCOUNTER — Ambulatory Visit: Payer: Medicare Other | Attending: Nurse Practitioner

## 2023-10-09 DIAGNOSIS — M5459 Other low back pain: Secondary | ICD-10-CM | POA: Insufficient documentation

## 2023-10-09 DIAGNOSIS — M6281 Muscle weakness (generalized): Secondary | ICD-10-CM | POA: Insufficient documentation

## 2023-10-09 DIAGNOSIS — M25561 Pain in right knee: Secondary | ICD-10-CM | POA: Insufficient documentation

## 2023-10-09 DIAGNOSIS — M25562 Pain in left knee: Secondary | ICD-10-CM | POA: Insufficient documentation

## 2023-10-09 DIAGNOSIS — G8929 Other chronic pain: Secondary | ICD-10-CM | POA: Diagnosis not present

## 2023-10-09 NOTE — Therapy (Signed)
OUTPATIENT PHYSICAL THERAPY THORACOLUMBAR TREATMENT NOTE   Patient Name: Ryan Craig MRN: [id number] DOB:May 09, 1946, 77 y.o., male Today's Date: 10/09/2023  PHYSICAL THERAPY DISCHARGE SUMMARY  Visits from Start of Care: 5  Current functional level related to goals / functional outcomes: 6 mwt: 800 feet with rolator Gait speed: 0.82m/s with st. Cane 5x sit to stand: 22 sec no UE support   Remaining deficits: Chronic pain   Education / Equipment: HEP, faxed order for Rolator to [facility]   Patient agrees to discharge. Patient goals were met. Patient is being discharged due to maximized rehab potential.    END OF SESSION:  PT End of Session - 10/09/23 1053     Visit Number 5    Number of Visits 11    Date for PT Re-Evaluation 09/26/23    Authorization Type Medicare/AARP    PT Start Time 0930    PT Stop Time 1030    PT Time Calculation (min) 60 min    Activity Tolerance Patient tolerated treatment well    Behavior During Therapy WFL for tasks assessed/performed              Past Medical History:  Diagnosis Date   Anemia    no on any meds   Arthritis    Bifascicular block    Chronic back pain    deteriorating   Hyperlipidemia    Hypertension    Joint pain    Joint swelling    Mild CAD    Myocarditis (HCC)    PAT (paroxysmal atrial tachycardia) (HCC)    Premature atrial contractions    PVC's (premature ventricular contractions)    Thoracic aortic aneurysm (TAA) (HCC)    Past Surgical History:  Procedure Laterality Date   arthroscopic knee surgery Right    COLONOSCOPY     INGUINAL HERNIA REPAIR Bilateral    x 4   JOINT REPLACEMENT Right 6 yrs ago   knee   KNEE ARTHROSCOPY Left 02/24/2014   Procedure: LEFT KNEE ARTHROSCOPY WITH PARTIAL MEDIAL MENISCECTOMY;  Surgeon: [name], MD;  Location: [facility] OR;  Service: Orthopedics;  Laterality: Left;   LEFT HEART CATH AND CORONARY ANGIOGRAPHY N/A 07/22/2020   Procedure: LEFT HEART CATH AND  CORONARY ANGIOGRAPHY;  Surgeon: [name], MD;  Location: [facility];  Service: Cardiovascular;  Laterality: N/A;   TOTAL KNEE ARTHROPLASTY Left 12/22/2014   Procedure: LEFT TOTAL KNEE ARTHROPLASTY;  Surgeon: [name], MD;  Location: [facility] OR;  Service: Orthopedics;  Laterality: Left;   Patient Active Problem List   Diagnosis Date Noted   Impaired gait and mobility 09/12/2023   Abnormal glucose 08/28/2023   Need for influenza vaccination 08/28/2023   Family history of bone cancer 08/28/2023   Chronic pain syndrome 08/20/2023   Chronic bilateral low back pain with left-sided sciatica 08/20/2023   Encounter for long-term use of opiate analgesic 08/20/2023   Trochanteric bursitis of right hip 08/20/2023   Herpes zoster vaccination declined 05/30/2023   Tetanus, diphtheria, and acellular pertussis (Tdap) vaccination declined 05/30/2023   Weight loss, abnormal 05/30/2023   Coronary artery disease 04/18/2023   Thoracic aortic aneurysm without rupture, unspecified part (HCC) 12/27/2022   Hemorrhage of rectum and anus 11/27/2022   Class 1 obesity due to excess calories with serious comorbidity and body mass index (BMI) of 31.0 to 31.9 in adult 03/27/2022   HLD (hyperlipidemia) 07/23/2020   Chest pain 07/22/2020   Chest pain of uncertain etiology    Essential  hypertension 01/22/2019   Persistent cough 01/22/2019   Right bundle branch block 11/21/2018   Left anterior fascicular block 11/21/2018   Former smoker 11/21/2018   Obesity (BMI 30-39.9) 11/21/2018   Osteoarthritis of left knee 12/22/2014   Status post total left knee replacement 12/22/2014   Acute medial meniscus tear of left knee 02/24/2014    PCP: [name], FNP  REFERRING PROVIDER: Angelina Sheriff, DO  REFERRING DIAG: G89.4 (ICD-10-CM) - Chronic pain syndrome M54.41,G89.29 (ICD-10-CM) - Chronic bilateral low back pain with right-sided sciatica  Rationale for Evaluation and Treatment:  Rehabilitation  THERAPY DIAG:  Other low back pain  Chronic pain of both knees  Muscle weakness (generalized)  ONSET DATE: 08/20/2023  SUBJECTIVE:                                                                                                                                                                                           SUBJECTIVE STATEMENT: Pt reports he was fixing bed and his feet got caught and he fell. Fall was on Saturday. His back is sore from it as well as his R shoulder.    PERTINENT HISTORY:  past medical history of Anemia, Arthritis, Bifascicular block, Chronic back pain, Hyperlipidemia, Hypertension, Joint pain, Joint swelling, Mild CAD, Myocarditis (HCC), PAT (paroxysmal atrial tachycardia) (HCC), Premature atrial contractions, PVC's (premature ventricular contractions), and Thoracic aortic aneurysm (TAA) (HCC).  PAIN:  Are you having pain? Yes: NPRS scale: 8-9/10 Pain location: lower back R>L Pain description: achy, stiff  Aggravating factors: standing, walking bending Relieving factors: sitting rest  PRECAUTIONS: None  RED FLAGS: None   WEIGHT BEARING RESTRICTIONS: No  FALLS:  Has patient fallen in last 6 months? No  L PATIENT GOALS: improve pain management    OBJECTIVE:  Note: Objective measures were completed at Evaluation unless otherwise noted.  DIAGNOSTIC FINDINGS:  08/20/2021 IMPRESSION: 1. Progressive diffuse lumbar disc and facet degeneration with severe spinal stenosis at L2-3 and L3-4. 2. Decreased size of L4-5 disc extrusion but with persistent severe left lateral recess stenosis.   COGNITION: Overall cognitive status: Within functional limits for tasks assessed    POSTURE: rounded shoulders, forward head, increased lumbar lordosis, increased thoracic kyphosis, posterior pelvic tilt, and flexed trunk    LUMBAR ROM:   AROM eval  Flexion   Extension   Right lateral flexion   Left lateral flexion   Right rotation    Left rotation    (Blank rows = not tested)  LOWER EXTREMITY ROM:     Active  Right eval Left eval  Hip flexion    Hip extension    Hip abduction  Hip adduction    Hip internal rotation    Hip external rotation    Knee flexion    Knee extension    Ankle dorsiflexion    Ankle plantarflexion    Ankle inversion    Ankle eversion     (Blank rows = not tested)  LOWER EXTREMITY MMT:    MMT Right eval Left eval  Hip flexion    Hip extension    Hip abduction    Hip adduction    Hip internal rotation    Hip external rotation    Knee flexion    Knee extension    Ankle dorsiflexion    Ankle plantarflexion    Ankle inversion    Ankle eversion     (Blank rows = not tested)   FUNCTIONAL TESTS:  5 times sit to stand: 37 sec without UE support 6 minute walk test: Pt unable to finish test. Pt able to ambulate with st. Cane for 560 feet in 4 min 45 sec and then had to stop due to pain in his lower back 10 meter walk test: 0.52 m/s with st. cane ; 0.62 m/s  GAIT: Distance walked: ?990' total Assistive device utilized: Single point cane and Walker - 4 wheeled Level of assistance: SBA   TODAY'S TREATMENT:                                                                                                                              DATE:  Manual therapy soft tissue massage to bil lumbar paraspinalis and multifidus Pt reported pain level come down to <3//10 afterwards with walking 115' with st. Cane. Performed 6 minute walk test with rolator: 800 feet- met goal Sci Fit level 4 for 7' UE and LE for aerobic conditioning Performed sit to stand: 22 sec without UE support from mat table on lowest height Performed gait speed: 0.74 m/s with st. Cane  Pt educated on how to adjust the walker. They went to [facility] for walker but they don't accept Medicare.  With patient and spouse's permission, pt's face sheet along with medical notes and walker order will be faxed to [facility]  for processing.   Supine hooklying lower trunk rotations: 2 x 10 Supine bridge: 10x Upine piriformis stretch: R 3 x 30", deferred L due to anterior hip pain  PATIENT EDUCATION:  Education details: see above Person educated: Patient and Spouse Education method: Medical illustrator Education comprehension: verbalized understanding  HOME EXERCISE PROGRAM: Access Code: [email or web address] URL: [email or web address] Date: 08/29/2023 Prepared by: Lavone Nian  Exercises - Sit to Stand with Counter Support  - 1 x daily - 7 x weekly - 1 sets - 10 reps  Access Code: [email or web address] URL: [email or web address] Date: 09/11/2023 Prepared by: Lavone Nian  Exercises - Sit to Stand with Counter Support  - 1 x daily - 7 x weekly - 1 sets - 10 reps - Supine Lower Trunk Rotation  - 1 x  daily - 7 x weekly - 10 reps - Supine March  - 1 x daily - 7 x weekly - 2 sets - 10 reps - Bilateral Bent Leg Lift  - 1 x daily - 7 x weekly - 2 sets - 5 reps  ASSESSMENT:  CLINICAL IMPRESSION: Patient has been seen for total of 5 sessions for chronic lower back pain. Patient has progressed well and met 2/3 of his long term goals and almost met his 3rd goal for gait speed. Pt has good compliance with HEP for self management of symptoms. Patient will be discharged from skilled PT  OBJECTIVE IMPAIRMENTS: Abnormal gait, decreased activity tolerance, decreased endurance, decreased mobility, difficulty walking, decreased ROM, decreased strength, hypomobility, increased fascial restrictions, increased muscle spasms, impaired flexibility, postural dysfunction, and pain.   ACTIVITY LIMITATIONS: carrying, lifting, bending, standing, squatting, stairs, and transfers  PARTICIPATION LIMITATIONS: meal prep, cleaning, laundry, shopping, community activity, and yard work  PERSONAL FACTORS: Age and Time since onset of injury/illness/exacerbation are also affecting patient's functional outcome.   REHAB  POTENTIAL: Fair chronic nature of pain  CLINICAL DECISION MAKING: Stable/uncomplicated  EVALUATION COMPLEXITY: Low   GOALS: Goals reviewed with patient? Yes   SHORT TERM GOALS: No STG established   LONG TERM GOALS: Target date: 09/26/2023    Pt will demo 5x sit to stand without UE support in <25 sec to improve overall functional strength Baseline: 37 sec (eval)22 sec (10/09/23) Goal status: Goal met  2.  Pt will demo gait speed of >0.75 m/s with appropriate AD to improve functional mobility and decrease fall risk Baseline: 0.52 m/s with st. cane; 0.62 m/s; 0.74 m/s with st. Cane (10/09/23) Goal status: Almost met  3.  Pt will demo improve ability to finish 6 minute walk test and be abl e to walk>800 feet with appropriate AD to improve walking endurance Baseline: Pt unable to finish 6 min walk test. Only able to perform 4 min 45 sec and covered 560 feet; 605 feet with rollator in 6 minutes with 1 seated break on rollator for 30 sec. (09/18/23); ; 800 feet without any seated break with with rolator (10/09/23) Goal status: gola met (10/09/23)   PLAN: discharge from skilled PT  Ileana Ladd, PT 10/09/2023, 10:54 AM

## 2023-10-29 ENCOUNTER — Telehealth: Payer: Self-pay

## 2023-10-29 DIAGNOSIS — M1712 Unilateral primary osteoarthritis, left knee: Secondary | ICD-10-CM

## 2023-10-29 DIAGNOSIS — G8929 Other chronic pain: Secondary | ICD-10-CM

## 2023-10-29 DIAGNOSIS — G894 Chronic pain syndrome: Secondary | ICD-10-CM

## 2023-10-29 NOTE — Telephone Encounter (Signed)
 Patient states that he was traveling over the holidays and pain is worse in his knee. He states he is having to use a Hydrocodone every day for breakthrough pain. Please advise.

## 2023-10-31 ENCOUNTER — Other Ambulatory Visit: Payer: Self-pay | Admitting: Physical Medicine and Rehabilitation

## 2023-10-31 DIAGNOSIS — M1712 Unilateral primary osteoarthritis, left knee: Secondary | ICD-10-CM

## 2023-10-31 DIAGNOSIS — G8929 Other chronic pain: Secondary | ICD-10-CM

## 2023-10-31 DIAGNOSIS — Z5181 Encounter for therapeutic drug level monitoring: Secondary | ICD-10-CM

## 2023-10-31 DIAGNOSIS — G894 Chronic pain syndrome: Secondary | ICD-10-CM

## 2023-10-31 MED ORDER — TRAMADOL HCL 50 MG PO TABS: 50.0000 mg | ORAL_TABLET | Freq: Four times a day (QID) | ORAL | 1 refills | Status: DC

## 2023-10-31 MED ORDER — TRAMADOL HCL 50 MG PO TABS: 50.0000 mg | ORAL_TABLET | Freq: Four times a day (QID) | ORAL | 1 refills | Status: DC | PRN

## 2023-10-31 NOTE — Addendum Note (Signed)
 Addended by: Elijah Birk on: 10/31/2023 04:55 PM   Modules accepted: Orders

## 2023-10-31 NOTE — Telephone Encounter (Signed)
 Discussed issue with patient, he has about 5 tabs left but endorses using 2 pills per day recently, one in the morning and one at night. No recent injury, attributes this to arthritis in his knee, for which he is doing stretches and alcohol cream; he does not find voltaren  very helpful and does not use bracing. He says the tramadol  does reduce his pain from 9/10 to 6/10, but does not last more than 4 hours.   I advised that he is to ration his remaining Norco and that an early refill is not appropriate based on him taking more than we prescribed without any changes in his pain or notifying the office first. I did increase his tramadol  to 50 mg QID, and sent a script for that with 1 refill; he is allowed to take 100 mg at once but not to exceed 200 mg per day. He has a follow up with Fidela in February. Thank you!

## 2023-10-31 NOTE — Addendum Note (Signed)
 Addended by: Elijah Birk on: 10/31/2023 04:54 PM   Modules accepted: Orders

## 2023-10-31 NOTE — Telephone Encounter (Signed)
 He has been using the Tramadol twice a day and has had to use the Hydrocodone for breakthrough. His wife seems to think he is in more pain cause he was traveling over the holidays and activity had increased. He is home now.

## 2023-10-31 NOTE — Addendum Note (Signed)
 Addended by: Elijah Birk on: 10/31/2023 04:58 PM   Modules accepted: Orders

## 2023-10-31 NOTE — Addendum Note (Signed)
 Addended by: Elijah Birk on: 10/31/2023 04:45 PM   Modules accepted: Orders

## 2023-11-06 ENCOUNTER — Ambulatory Visit (HOSPITAL_COMMUNITY): Payer: Medicare Other

## 2023-11-08 ENCOUNTER — Other Ambulatory Visit: Payer: Self-pay | Admitting: Nurse Practitioner

## 2023-11-08 DIAGNOSIS — I10 Essential (primary) hypertension: Secondary | ICD-10-CM

## 2023-11-13 ENCOUNTER — Other Ambulatory Visit: Payer: Self-pay | Admitting: Nurse Practitioner

## 2023-12-11 ENCOUNTER — Encounter: Payer: Self-pay | Admitting: Registered Nurse

## 2023-12-11 ENCOUNTER — Encounter: Payer: Medicare Other | Attending: Physical Medicine and Rehabilitation | Admitting: Registered Nurse

## 2023-12-11 VITALS — BP 124/77 | HR 81 | Ht 67.0 in | Wt 166.6 lb

## 2023-12-11 DIAGNOSIS — Z5181 Encounter for therapeutic drug level monitoring: Secondary | ICD-10-CM | POA: Diagnosis not present

## 2023-12-11 DIAGNOSIS — Z79891 Long term (current) use of opiate analgesic: Secondary | ICD-10-CM | POA: Diagnosis not present

## 2023-12-11 DIAGNOSIS — G8929 Other chronic pain: Secondary | ICD-10-CM | POA: Insufficient documentation

## 2023-12-11 DIAGNOSIS — M5442 Lumbago with sciatica, left side: Secondary | ICD-10-CM | POA: Diagnosis not present

## 2023-12-11 DIAGNOSIS — R2689 Other abnormalities of gait and mobility: Secondary | ICD-10-CM

## 2023-12-11 DIAGNOSIS — G894 Chronic pain syndrome: Secondary | ICD-10-CM

## 2023-12-11 DIAGNOSIS — M1711 Unilateral primary osteoarthritis, right knee: Secondary | ICD-10-CM | POA: Diagnosis not present

## 2023-12-11 MED ORDER — HYDROCODONE-ACETAMINOPHEN 5-325 MG PO TABS: 1.0000 | ORAL_TABLET | Freq: Every day | ORAL | 0 refills | Status: DC | PRN

## 2023-12-11 MED ORDER — TRAMADOL HCL 50 MG PO TABS: 50.0000 mg | ORAL_TABLET | Freq: Four times a day (QID) | ORAL | 1 refills | Status: DC

## 2023-12-11 NOTE — Progress Notes (Signed)
 Subjective:    Patient ID: Ryan Craig, male    DOB: November 19, 1945, 78 y.o.   MRN: [id number]  HPI: Ryan Craig is a 78 y.o. male who returns for follow up appointment for chronic pain and medication refill. He states his pain is located in his lower back and right knee pain. He rates his pain 6. His current exercise regime is walking short distances with his cane.   [name] Morphine equivalent is 45.00 MME.   UDS ordered today.     Pain Inventory Average Pain 8 Pain Right Now 6 My pain is constant and aching  In the last 24 hours, has pain interfered with the following? General activity 8 Relation with others 1 Enjoyment of life 0 What TIME of day is your pain at its worst? morning , daytime, evening, and night Sleep (in general) Fair  Pain is worse with: walking, standing, and some activites Pain improves with: rest and medication Relief from Meds: 4  Family History  Problem Relation Age of Onset   Heart attack Mother    Cancer Maternal Aunt    Cancer Paternal Aunt    Cancer Cousin    Social History   Socioeconomic History   Marital status: Married    Spouse name: Not on file   Number of children: Not on file   Years of education: Not on file   Highest education level: Not on file  Occupational History   Occupation: retired  Tobacco Use   Smoking status: Former    Passive exposure: Past   Smokeless tobacco: Former   Tobacco comments:    quit smoking about 6-7ytrs ago  Advertising account planner   Vaping status: Never Used  Substance and Sexual Activity   Alcohol use: No    Comment: drank over 40 years ago   Drug use: Yes    Types: Hydrocodone   Sexual activity: Not Currently  Other Topics Concern   Not on file  Social History Narrative   Not on file   Social Drivers of Health   Financial Resource Strain: Low Risk  (12/20/2022)   Overall Financial Resource Strain (CARDIA)    Difficulty of Paying Living Expenses: Not hard at all  Food Insecurity: No Food  Insecurity (12/20/2022)   Hunger Vital Sign    Worried About Running Out of Food in the Last Year: Never true    Ran Out of Food in the Last Year: Never true  Transportation Needs: No Transportation Needs (12/20/2022)   PRAPARE - Administrator, Civil Service (Medical): No    Lack of Transportation (Non-Medical): No  Physical Activity: Inactive (12/20/2022)   Exercise Vital Sign    Days of Exercise per Week: 0 days    Minutes of Exercise per Session: 0 min  Stress: No Stress Concern Present (12/20/2022)   [employer] of Occupational Health - Occupational Stress Questionnaire    Feeling of Stress : Not at all  Social Connections: Not on file   Past Surgical History:  Procedure Laterality Date   arthroscopic knee surgery Right    COLONOSCOPY     INGUINAL HERNIA REPAIR Bilateral    x 4   JOINT REPLACEMENT Right 6 yrs ago   knee   KNEE ARTHROSCOPY Left 02/24/2014   Procedure: LEFT KNEE ARTHROSCOPY WITH PARTIAL MEDIAL MENISCECTOMY;  Surgeon: [name], MD;  Location: [facility] OR;  Service: Orthopedics;  Laterality: Left;   LEFT HEART CATH AND CORONARY ANGIOGRAPHY N/A 07/22/2020   Procedure:  LEFT HEART CATH AND CORONARY ANGIOGRAPHY;  Surgeon: [name], MD;  Location: [facility];  Service: Cardiovascular;  Laterality: N/A;   TOTAL KNEE ARTHROPLASTY Left 12/22/2014   Procedure: LEFT TOTAL KNEE ARTHROPLASTY;  Surgeon: [name], MD;  Location: [facility] OR;  Service: Orthopedics;  Laterality: Left;   Past Surgical History:  Procedure Laterality Date   arthroscopic knee surgery Right    COLONOSCOPY     INGUINAL HERNIA REPAIR Bilateral    x 4   JOINT REPLACEMENT Right 6 yrs ago   knee   KNEE ARTHROSCOPY Left 02/24/2014   Procedure: LEFT KNEE ARTHROSCOPY WITH PARTIAL MEDIAL MENISCECTOMY;  Surgeon: [name], MD;  Location: [facility] OR;  Service: Orthopedics;  Laterality: Left;   LEFT HEART CATH AND CORONARY ANGIOGRAPHY N/A 07/22/2020    Procedure: LEFT HEART CATH AND CORONARY ANGIOGRAPHY;  Surgeon: [name], MD;  Location: [facility];  Service: Cardiovascular;  Laterality: N/A;   TOTAL KNEE ARTHROPLASTY Left 12/22/2014   Procedure: LEFT TOTAL KNEE ARTHROPLASTY;  Surgeon: [name], MD;  Location: [facility] OR;  Service: Orthopedics;  Laterality: Left;   Past Medical History:  Diagnosis Date   Anemia    no on any meds   Arthritis    Bifascicular block    Chronic back pain    deteriorating   Hyperlipidemia    Hypertension    Joint pain    Joint swelling    Mild CAD    Myocarditis (HCC)    PAT (paroxysmal atrial tachycardia) (HCC)    Premature atrial contractions    PVC's (premature ventricular contractions)    Thoracic aortic aneurysm (TAA) (HCC)    BP 124/77   Pulse 81   Ht 5\' 7"  (1.702 m)   Wt 166 lb 9.6 oz (75.6 kg)   SpO2 91%   BMI 26.09 kg/m   Opioid Risk Score:   Fall Risk Score:  `1  Depression screen [facility] 2/9     12/11/2023    8:49 AM 10/08/2023    9:01 AM 08/28/2023    8:53 AM 08/20/2023   10:59 AM 12/27/2022    8:27 AM 12/20/2022    8:32 AM 12/07/2021    8:33 AM  Depression screen PHQ 2/9  Decreased Interest 0 0 1 3 0 0 0  Down, Depressed, Hopeless 0 0 1 3 0 0 0  PHQ - 2 Score 0 0 2 6 0 0 0  Altered sleeping   0 3     Tired, decreased energy   1 2     Change in appetite   3 3     Feeling bad or failure about yourself    1 3     Trouble concentrating   0 2     Moving slowly or fidgety/restless   0 0     Suicidal thoughts   0 0     PHQ-9 Score   7 19     Difficult doing work/chores   Not difficult at all         Review of Systems  Musculoskeletal:  Positive for gait problem.       Hurts from right knee to his back  All other systems reviewed and are negative.      Objective:   Physical Exam Vitals and nursing note reviewed.  Constitutional:      Appearance: Normal appearance.  Cardiovascular:     Rate and Rhythm: Normal rate and regular rhythm.  Pulses: Normal pulses.     Heart sounds: Normal heart sounds.  Pulmonary:     Effort: Pulmonary effort is normal.     Breath sounds: Normal breath sounds.  Musculoskeletal:     Comments: Normal Muscle Bulk and Muscle Testing Reveals:  Upper Extremities: Full ROM and Muscle Strength: 5/5  Lumbar Paraspinal Tenderness: L-4-L-5 Mainly Right Side  Lower Extremities : Right: Decreased ROM and Muscle Strength 5/5 Right Lower Extremity Flexion Produces Pain into his Right Patella Left Lower Extremity: Full ROM and Muscle Strength 5/5 Arises from Table slowly using cane for support Narrow Based Gait     Skin:    General: Skin is warm and dry.  Neurological:     Mental Status: He is alert and oriented to person, place, and time.  Psychiatric:        Mood and Affect: Mood normal.        Behavior: Behavior normal.         Assessment & Plan:  Chronic Bilateral low back pain with left sided sciatica: Continue Physical Therapy. Continue to Monitor. 12/11/2023 Impaired Gait Mobility: Continue to use assistive device with his cane. 12/11/2023 Chronic Pain Syndrome: Refilled: Hydrocodone 5/325 mg daily, using it sparingly, second script sent for the following month and  Tramadol 50 mg one tablet 4 times  a day as needed for pain.#120. May take two tablets twice a day, do not exceed 4 tablets daily.  We will continue the opioid monitoring program, this consists of regular clinic visits, examinations, urine drug screen, pill counts as well as use of [place] Controlled Substance Reporting system. A 12 month History has been reviewed on the [place] Controlled Substance Reporting System on 12/11/2023   F/U in 2 months

## 2023-12-15 LAB — TOXASSURE SELECT,+ANTIDEPR,UR

## 2023-12-17 ENCOUNTER — Encounter (HOSPITAL_COMMUNITY): Payer: Self-pay

## 2023-12-17 ENCOUNTER — Ambulatory Visit (HOSPITAL_COMMUNITY): Payer: Medicare Other | Attending: Nurse Practitioner

## 2023-12-19 ENCOUNTER — Other Ambulatory Visit (HOSPITAL_COMMUNITY): Payer: Self-pay | Admitting: Gastroenterology

## 2023-12-19 DIAGNOSIS — R630 Anorexia: Secondary | ICD-10-CM | POA: Diagnosis not present

## 2023-12-19 DIAGNOSIS — R634 Abnormal weight loss: Secondary | ICD-10-CM

## 2023-12-25 NOTE — Progress Notes (Signed)
 Marcy Salvo, CMA,acting as a scribe for [name], FNP.,have documented all relevant documentation on the behalf of [name], FNP,as directed by  [name], FNP while in the presence of [name], FNP.  Subjective:  Patient ID: Ryan Craig , male    DOB: December 22, 1945 , 78 y.o.   MRN: [id number]  Chief Complaint  Patient presents with   Hypertension    HPI  Patient presents today for a bp and pre dm follow up, Patient reports compliance with medication. Patient denies any chest pain, SOB, or headaches. Patient has no concerns today. Checks bp every other day, readings 100's/70's, no higher than 130 systolic.  Taking medications as ordered, states that knee pain has been better and taking less medication since done with PT.  States that he does eat very much anymore over the past few months since losing weight, drinks one boost a day.  CT scan on 3/15, had recent colonoscopy.  Adds some salt to foods, not much.  Not exercising regularly, rides stationary bike at times but prefers working in garden outside.    Had his AWV done today as well     Past Medical History:  Diagnosis Date   Anemia    no on any meds   Arthritis    Bifascicular block    Chronic back pain    deteriorating   Hyperlipidemia    Hypertension    Joint pain    Joint swelling    Mild CAD    Myocarditis (HCC)    PAT (paroxysmal atrial tachycardia) (HCC)    Premature atrial contractions    PVC's (premature ventricular contractions)    Thoracic aortic aneurysm (TAA) (HCC)      Family History  Problem Relation Age of Onset   Heart attack Mother    Cancer Maternal Aunt    Cancer Paternal Aunt    Cancer Cousin      Current Outpatient Medications:    acetaminophen (TYLENOL) 500 MG tablet, Take 500 mg by mouth every 6 (six) hours as needed., Disp: , Rfl:    amLODipine (NORVASC) 10 MG tablet, Take 1 tablet (10 mg total) by mouth daily., Disp: 90 tablet, Rfl: 3   aspirin EC 81 MG tablet, Take 81  mg by mouth daily., Disp: , Rfl:    BLACK CURRANT SEED OIL PO, Take by mouth., Disp: , Rfl:    Cholecalciferol (VITAMIN D-3 PO), Take 2 tablets by mouth daily. , Disp: , Rfl:    lisinopril (ZESTRIL) 10 MG tablet, TAKE 1 TABLET BY MOUTH EVERY DAY, Disp: 90 tablet, Rfl: 1   metoprolol succinate (TOPROL-XL) 25 MG 24 hr tablet, TAKE 1 TABLET BY MOUTH EVERY DAY (NEED OFFIE VISIT), Disp: 90 tablet, Rfl: 3   rosuvastatin (CRESTOR) 40 MG tablet, TAKE 1 TABLET BY MOUTH EVERY DAY, Disp: 90 tablet, Rfl: 0   traMADol (ULTRAM) 50 MG tablet, Take 1 tablet (50 mg total) by mouth 4 (four) times daily. You may take up to 2 tablets at once, but do not exceed 4 tablets daily, Disp: 120 tablet, Rfl: 1   triamcinolone cream (KENALOG) 0.1 %, APPLY TOPICALLY A THIN LAYER TO THE AFFECTED AREA(S) TWICE A DAY, Disp: 454 g, Rfl: 0   HYDROcodone-acetaminophen (NORCO) 5-325 MG tablet, Take 1 tablet by mouth daily as needed for severe pain (pain score 7-10). Use as needed once daily for breakthrough pain; you are only receiving 15 tablets for the month and are responsible for budgeting them; early refills of  this medication will not be permitted. Do Not Fill Before 01/05/2024 (Patient not taking: Reported on 12/26/2023), Disp: 15 tablet, Rfl: 0   Allergies  Allergen Reactions   Contrast Media [Iodinated Contrast Media] Anaphylaxis   Latex Rash     Review of Systems  All other systems reviewed and are negative.    Today's Vitals   12/26/23 0916  BP: 112/62  Pulse: 84  Temp: (!) 97.5 F (36.4 C)  TempSrc: Oral  Weight: 167 lb (75.8 kg)  Height: 5\' 7"  (1.702 m)  PainSc: 0-No pain   Body mass index is 26.16 kg/m.  Wt Readings from Last 3 Encounters:  12/26/23 167 lb (75.8 kg)  12/26/23 167 lb 6.4 oz (75.9 kg)  12/11/23 166 lb 9.6 oz (75.6 kg)     Objective:  Physical Exam Vitals reviewed.  Constitutional:      Appearance: Normal appearance.  HENT:     Head: Normocephalic and atraumatic.  Eyes:      Conjunctiva/sclera: Conjunctivae normal.     Pupils: Pupils are equal, round, and reactive to light.  Cardiovascular:     Rate and Rhythm: Normal rate and regular rhythm.     Pulses: Normal pulses.     Heart sounds: Normal heart sounds.  Pulmonary:     Effort: Pulmonary effort is normal.     Breath sounds: Normal breath sounds.  Musculoskeletal:        General: Normal range of motion.     Cervical back: Normal range of motion.  Skin:    General: Skin is warm and dry.     Capillary Refill: Capillary refill takes 2 to 3 seconds.  Neurological:     Mental Status: He is alert.  Psychiatric:        Mood and Affect: Mood normal.        Behavior: Behavior normal.        Thought Content: Thought content normal.         Assessment And Plan:  Essential hypertension Assessment & Plan: Blood pressure is well controlled.  Advised to focus on lifestyle modifications. Continue current medications.   Orders: -     BMP8+eGFR  Pure hypercholesterolemia Assessment & Plan: Cholesterol levels are stable.  Continue statin, tolerating well and follow-up with cardiology  Orders: -     Lipid panel  Abnormal glucose Assessment & Plan: Stable, continue focusing on lifestyle modifications.   Orders: -     Hemoglobin A1c    Return for 6 month bp check.  Patient was given opportunity to ask questions. Patient verbalized understanding of the plan and was able to repeat key elements of the plan. All questions were answered to their satisfaction.   Jeanell Sparrow, FNP, have reviewed all documentation for this visit. The documentation on 12/25/23 for the exam, diagnosis, procedures, and orders are all accurate and complete.   I have reviewed this encounter including the documentation in this note and/or discussed this patient with the Ezra Sites Student NP. I am certifying that I agree with the content of this note as the primary care nurse practitioner.  [name], DNP, FNP-BC  IF  YOU HAVE BEEN REFERRED TO A SPECIALIST, IT MAY TAKE 1-2 WEEKS TO SCHEDULE/PROCESS THE REFERRAL. IF YOU HAVE NOT HEARD FROM US/SPECIALIST IN TWO WEEKS, PLEASE GIVE [place] A CALL AT [phone].

## 2023-12-26 ENCOUNTER — Encounter: Payer: Self-pay | Admitting: Nurse Practitioner

## 2023-12-26 ENCOUNTER — Ambulatory Visit: Payer: Medicare Other | Admitting: Nurse Practitioner

## 2023-12-26 ENCOUNTER — Ambulatory Visit: Payer: Medicare Other

## 2023-12-26 VITALS — BP 112/62 | HR 84 | Temp 97.5°F | Ht 67.0 in | Wt 167.4 lb

## 2023-12-26 VITALS — BP 112/62 | HR 84 | Temp 97.5°F | Ht 67.0 in | Wt 167.0 lb

## 2023-12-26 DIAGNOSIS — Z0000 Encounter for general adult medical examination without abnormal findings: Secondary | ICD-10-CM

## 2023-12-26 DIAGNOSIS — I10 Essential (primary) hypertension: Secondary | ICD-10-CM | POA: Diagnosis not present

## 2023-12-26 DIAGNOSIS — E7800 Pure hypercholesterolemia, unspecified: Secondary | ICD-10-CM

## 2023-12-26 DIAGNOSIS — R7309 Other abnormal glucose: Secondary | ICD-10-CM

## 2023-12-26 NOTE — Progress Notes (Signed)
 Subjective:   Ryan Craig is a 78 y.o. who presents for a Medicare Wellness preventive visit.  Visit Complete: In person    AWV Questionnaire: No: Patient Medicare AWV questionnaire was not completed prior to this visit.  Cardiac Risk Factors include: advanced age (>67men, >20 women);dyslipidemia;hypertension;male gender     Objective:    Today's Vitals   12/26/23 0901  BP: 112/62  Pulse: 84  Temp: (!) 97.5 F (36.4 C)  TempSrc: Oral  SpO2: 96%  Weight: 167 lb 6.4 oz (75.9 kg)  Height: 5\' 7"  (1.702 m)   Body mass index is 26.22 kg/m.     12/26/2023    9:07 AM 08/29/2023   11:10 AM 12/20/2022    8:32 AM 01/02/2022   11:46 AM 12/07/2021    8:33 AM 03/28/2021    8:38 AM 11/25/2020    9:31 AM  Advanced Directives  Does Patient Have a Medical Advance Directive? Yes No No No Yes Yes Yes  Type of Advance Directive Living will    Living will Healthcare Power of [place];Living will Healthcare Power of [place];Living will  Does patient want to make changes to medical advance directive?      No - Patient declined   Copy of Healthcare Power of Attorney in Chart?      No - copy requested No - copy requested  Would patient like information on creating a medical advance directive?  No - Patient declined         Current Medications (verified) Outpatient Encounter Medications as of 12/26/2023  Medication Sig   acetaminophen (TYLENOL) 500 MG tablet Take 500 mg by mouth every 6 (six) hours as needed.   amLODipine (NORVASC) 10 MG tablet Take 1 tablet (10 mg total) by mouth daily.   aspirin EC 81 MG tablet Take 81 mg by mouth daily.   BLACK CURRANT SEED OIL PO Take by mouth.   Cholecalciferol (VITAMIN D-3 PO) Take 2 tablets by mouth daily.    lisinopril (ZESTRIL) 10 MG tablet TAKE 1 TABLET BY MOUTH EVERY DAY   metoprolol succinate (TOPROL-XL) 25 MG 24 hr tablet TAKE 1 TABLET BY MOUTH EVERY DAY (NEED OFFIE VISIT)   rosuvastatin (CRESTOR) 40 MG tablet TAKE 1 TABLET BY MOUTH EVERY DAY    traMADol (ULTRAM) 50 MG tablet Take 1 tablet (50 mg total) by mouth 4 (four) times daily. You may take up to 2 tablets at once, but do not exceed 4 tablets daily   triamcinolone cream (KENALOG) 0.1 % APPLY TOPICALLY A THIN LAYER TO THE AFFECTED AREA(S) TWICE A DAY   vitamin B-12 (CYANOCOBALAMIN) 100 MCG tablet Take 1 tablet (100 mcg total) by mouth daily.   HYDROcodone-acetaminophen (NORCO) 5-325 MG tablet Take 1 tablet by mouth daily as needed for severe pain (pain score 7-10). Use as needed once daily for breakthrough pain; you are only receiving 15 tablets for the month and are responsible for budgeting them; early refills of this medication will not be permitted. Do Not Fill Before 01/05/2024 (Patient not taking: Reported on 12/26/2023)   No facility-administered encounter medications on file as of 12/26/2023.    Allergies (verified) Contrast media [iodinated contrast media] and Latex   History: Past Medical History:  Diagnosis Date   Anemia    no on any meds   Arthritis    Bifascicular block    Chronic back pain    deteriorating   Hyperlipidemia    Hypertension    Joint pain    Joint swelling  Mild CAD    Myocarditis (HCC)    PAT (paroxysmal atrial tachycardia) (HCC)    Premature atrial contractions    PVC's (premature ventricular contractions)    Thoracic aortic aneurysm (TAA) (HCC)    Past Surgical History:  Procedure Laterality Date   arthroscopic knee surgery Right    COLONOSCOPY     INGUINAL HERNIA REPAIR Bilateral    x 4   JOINT REPLACEMENT Right 6 yrs ago   knee   KNEE ARTHROSCOPY Left 02/24/2014   Procedure: LEFT KNEE ARTHROSCOPY WITH PARTIAL MEDIAL MENISCECTOMY;  Surgeon: [name], MD;  Location: [facility] OR;  Service: Orthopedics;  Laterality: Left;   LEFT HEART CATH AND CORONARY ANGIOGRAPHY N/A 07/22/2020   Procedure: LEFT HEART CATH AND CORONARY ANGIOGRAPHY;  Surgeon: [name], MD;  Location: [facility] INVASIVE CV LAB;  Service: Cardiovascular;   Laterality: N/A;   TOTAL KNEE ARTHROPLASTY Left 12/22/2014   Procedure: LEFT TOTAL KNEE ARTHROPLASTY;  Surgeon: [name], MD;  Location: [facility] OR;  Service: Orthopedics;  Laterality: Left;   Family History  Problem Relation Age of Onset   Heart attack Mother    Cancer Maternal Aunt    Cancer Paternal Aunt    Cancer Cousin    Social History   Socioeconomic History   Marital status: Married    Spouse name: Not on file   Number of children: Not on file   Years of education: Not on file   Highest education level: Not on file  Occupational History   Occupation: retired  Tobacco Use   Smoking status: Former    Passive exposure: Past   Smokeless tobacco: Former   Tobacco comments:    quit smoking about 6-7ytrs ago  Advertising account planner   Vaping status: Never Used  Substance and Sexual Activity   Alcohol use: No    Comment: drank over 40 years ago   Drug use: Not Currently    Types: Hydrocodone   Sexual activity: Not Currently  Other Topics Concern   Not on file  Social History Narrative   Not on file   Social Drivers of Health   Financial Resource Strain: Low Risk  (12/26/2023)   Overall Financial Resource Strain (CARDIA)    Difficulty of Paying Living Expenses: Not hard at all  Food Insecurity: No Food Insecurity (12/26/2023)   Hunger Vital Sign    Worried About Running Out of Food in the Last Year: Never true    Ran Out of Food in the Last Year: Never true  Transportation Needs: No Transportation Needs (12/26/2023)   PRAPARE - Administrator, Civil Service (Medical): No    Lack of Transportation (Non-Medical): No  Physical Activity: Inactive (12/26/2023)   Exercise Vital Sign    Days of Exercise per Week: 0 days    Minutes of Exercise per Session: 0 min  Stress: No Stress Concern Present (12/26/2023)   [employer] of Occupational Health - Occupational Stress Questionnaire    Feeling of Stress : Not at all  Social Connections: Moderately Integrated  (12/26/2023)   Social Connection and Isolation Panel [NHANES]    Frequency of Communication with Friends and Family: More than three times a week    Frequency of Social Gatherings with Friends and Family: Once a week    Attends Religious Services: More than 4 times per year    Active Member of [employer] or Organizations: No    Attends Banker Meetings: Never    Marital Status:  Married    Tobacco Counseling Counseling given: Not Answered Tobacco comments: quit smoking about 6-7ytrs ago    Clinical Intake:  Pre-visit preparation completed: Yes  Pain : No/denies pain     Nutritional Status: BMI 25 -29 Overweight Nutritional Risks: None Diabetes: No  How often do you need to have someone help you when you read instructions, pamphlets, or other written materials from your doctor or pharmacy?: 1 - Never  Interpreter Needed?: No  Information entered by :: NAllen LPN   Activities of Daily Living     12/26/2023    9:02 AM  In your present state of health, do you have any difficulty performing the following activities:  Hearing? 0  Vision? 0  Difficulty concentrating or making decisions? 0  Walking or climbing stairs? 0  Dressing or bathing? 0  Doing errands, shopping? 0  Preparing Food and eating ? N  Using the Toilet? N  In the past six months, have you accidently leaked urine? Y  Do you have problems with loss of bowel control? N  Managing your Medications? N  Managing your Finances? N  Housekeeping or managing your Housekeeping? N    Patient Care Team: [name], FNP as PCP - General (General Practice) Jake Bathe, MD as PCP - Cardiology (Cardiology)  Indicate any recent Medical Services you may have received from other than [facility] providers in the past year (date may be approximate).     Assessment:   This is a routine wellness examination for [place].  Hearing/Vision screen Hearing Screening - Comments:: Denies hearing issues Vision Screening -  Comments:: No regular eye exams   Goals Addressed             This Visit's Progress    Patient Stated       12/26/2023, stay healthy       Depression Screen     12/26/2023    9:08 AM 12/11/2023    8:49 AM 10/08/2023    9:01 AM 08/28/2023    8:53 AM 08/20/2023   10:59 AM 12/27/2022    8:27 AM 12/20/2022    8:32 AM  PHQ 2/9 Scores  PHQ - 2 Score 0 0 0 2 6 0 0  PHQ- 9 Score 0   7 19      Fall Risk     12/26/2023    9:08 AM 12/11/2023    8:48 AM 10/08/2023    9:01 AM 08/20/2023   11:06 AM 12/27/2022    8:27 AM  Fall Risk   Falls in the past year? 1 1 1  0 0  Comment loses balance  LAST FALL IN DEC 2024 WITH NO INJURY.    Number falls in past yr: 1 0 0 0   Comment  09/2023     Injury with Fall? 0 0 0 0   Risk for fall due to : Medication side effect;History of fall(s);Impaired mobility;Impaired balance/gait      Follow up Falls prevention discussed;Falls evaluation completed        MEDICARE RISK AT HOME:  Medicare Risk at Home Any stairs in or around the home?: No If so, are there any without handrails?: No Home free of loose throw rugs in walkways, pet beds, electrical cords, etc?: Yes Adequate lighting in your home to reduce risk of falls?: Yes Life alert?: No Use of a cane, walker or w/c?: Yes Grab bars in the bathroom?: No Shower chair or bench in shower?: Yes Elevated toilet seat or a  handicapped toilet?: No  TIMED UP AND GO:  Was the test performed?  Yes  Length of time to ambulate 10 feet: 7 sec Gait slow and steady with assistive device  Cognitive Function: 6CIT completed    10/22/2018    9:11 AM  MMSE - Mini Mental State Exam  Orientation to time 5  Orientation to Place 5  Registration 3  Attention/ Calculation 5  Language- name 2 objects 2  Language- read & follow direction 1  Write a sentence 1  Copy design 1        12/26/2023    9:10 AM 12/20/2022    8:33 AM 08/28/2022    8:52 AM 12/07/2021    8:35 AM 11/25/2020    9:35 AM  6CIT Screen  What  Year? 0 points 0 points 0 points 0 points 0 points  What month? 0 points 0 points 0 points 0 points 0 points  What time? 0 points 0 points 0 points 0 points 0 points  Count back from 20 0 points 0 points 0 points 0 points 0 points  Months in reverse 4 points 2 points 4 points 2 points 4 points  Repeat phrase 4 points 0 points 8 points 8 points 4 points  Total Score 8 points 2 points 12 points 10 points 8 points    Immunizations Immunization History  Administered Date(s) Administered   Fluad Quad(high Dose 65+) 09/14/2020, 06/28/2021, 08/03/2022   Fluad Trivalent(High Dose 65+) 08/28/2023   Influenza, High Dose Seasonal PF 09/27/2018, 08/05/2019   PFIZER(Purple Top)SARS-COV-2 Vaccination 12/17/2019, 01/14/2020, 08/30/2020, 02/07/2021   Pfizer Covid-19 Vaccine Bivalent Booster 57yrs & up 12/14/2021, 08/15/2022    Screening Tests Health Maintenance  Topic Date Due   Pneumonia Vaccine 55+ Years old (1 of 2 - PCV) 12/27/2023 (Originally 12/09/1951)   COVID-19 Vaccine (7 - 2024-25 season) 01/11/2024 (Originally 06/24/2023)   Zoster Vaccines- Shingrix (1 of 2) 03/27/2024 (Originally 12/09/1993)   DTaP/Tdap/Td (1 - Tdap) 12/25/2024 (Originally 12/09/1964)   Medicare Annual Wellness (AWV)  12/25/2024   INFLUENZA VACCINE  Completed   HPV VACCINES  Aged Out   Colonoscopy  Discontinued   Hepatitis C Screening  Discontinued    Health Maintenance  There are no preventive care reminders to display for this patient.  Health Maintenance Items Addressed: Declines vaccines  Additional Screening:  Vision Screening: Recommended annual ophthalmology exams for early detection of glaucoma and other disorders of the eye.  Dental Screening: Recommended annual dental exams for proper oral hygiene  Community Resource Referral / Chronic Care Management: CRR required this visit?  No   CCM required this visit?  No     Plan:     I have personally reviewed and noted the following in the patient's  chart:   Medical and social history Use of alcohol, tobacco or illicit drugs  Current medications and supplements including opioid prescriptions. Patient is not currently taking opioid prescriptions. Functional ability and status Nutritional status Physical activity Advanced directives List of other physicians Hospitalizations, surgeries, and ER visits in previous 12 months Vitals Screenings to include cognitive, depression, and falls Referrals and appointments  In addition, I have reviewed and discussed with patient certain preventive protocols, quality metrics, and best practice recommendations. A written personalized care plan for preventive services as well as general preventive health recommendations were provided to patient.     Barb Merino, LPN   10/28/1094   After Visit Summary: (In Person-Printed) AVS printed and given to the patient  Notes:  Nothing significant to report at this time.

## 2023-12-26 NOTE — Patient Instructions (Signed)
 Mr. Dettman , Thank you for taking time to come for your Medicare Wellness Visit. I appreciate your ongoing commitment to your health goals. Please review the following plan we discussed and let me know if I can assist you in the future.   Referrals/Orders/Follow-Ups/Clinician Recommendations: none  This is a list of the screening recommended for you and due dates:  Health Maintenance  Topic Date Due   Pneumonia Vaccine (1 of 2 - PCV) 12/27/2023*   COVID-19 Vaccine (7 - 2024-25 season) 01/11/2024*   Zoster (Shingles) Vaccine (1 of 2) 03/27/2024*   DTaP/Tdap/Td vaccine (1 - Tdap) 12/25/2024*   Medicare Annual Wellness Visit  12/25/2024   Flu Shot  Completed   HPV Vaccine  Aged Out   Colon Cancer Screening  Discontinued   Hepatitis C Screening  Discontinued  *Topic was postponed. The date shown is not the original due date.    Advanced directives: (Copy Requested) Please bring a copy of your health care power of attorney and living will to the office to be added to your chart at your convenience.  Next Medicare Annual Wellness Visit scheduled for next year: No  insert Preventive Care attachment 7Insert FALL PREVENTION attachment if needed

## 2023-12-27 LAB — LIPID PANEL
Chol/HDL Ratio: 2.2 ratio (ref 0.0–5.0)
Cholesterol, Total: 119 mg/dL (ref 100–199)
HDL: 53 mg/dL (ref >39)
LDL Chol Calc (NIH): 50 mg/dL (ref 0–99)
Triglycerides: 78 mg/dL (ref 0–149)
VLDL Cholesterol Cal: 16 mg/dL (ref 5–40)

## 2023-12-27 LAB — BMP8+EGFR
BUN/Creatinine Ratio: 18 (ref 10–24)
BUN: 18 mg/dL (ref 8–27)
CO2: 22 mmol/L (ref 20–29)
Calcium: 9.7 mg/dL (ref 8.6–10.2)
Chloride: 104 mmol/L (ref 96–106)
Creatinine, Ser: 0.98 mg/dL (ref 0.76–1.27)
Glucose: 102 mg/dL — ABNORMAL HIGH (ref 70–99)
Potassium: 5 mmol/L (ref 3.5–5.2)
Sodium: 142 mmol/L (ref 134–144)
eGFR: 78 mL/min/{1.73_m2} (ref >59)

## 2023-12-27 LAB — HEMOGLOBIN A1C
Est. average glucose Bld gHb Est-mCnc: 120 mg/dL
Hgb A1c MFr Bld: 5.8 % — ABNORMAL HIGH (ref 4.8–5.6)

## 2024-01-04 ENCOUNTER — Encounter: Payer: Self-pay | Admitting: Nurse Practitioner

## 2024-01-04 NOTE — Assessment & Plan Note (Signed)
 Stable, continue focusing on lifestyle modifications.

## 2024-01-04 NOTE — Assessment & Plan Note (Signed)
Cholesterol levels are stable.  Continue statin, tolerating well and follow-up with cardiology

## 2024-01-04 NOTE — Assessment & Plan Note (Signed)
 Blood pressure is well controlled.  Advised to focus on lifestyle modifications. Continue current medications.

## 2024-01-05 ENCOUNTER — Ambulatory Visit (HOSPITAL_BASED_OUTPATIENT_CLINIC_OR_DEPARTMENT_OTHER)
Admission: RE | Admit: 2024-01-05 | Discharge: 2024-01-05 | Disposition: A | Discharge status: Home / Self Care | Payer: Medicare Other | Source: Ambulatory Visit | Attending: Gastroenterology | Admitting: Gastroenterology

## 2024-01-05 DIAGNOSIS — R634 Abnormal weight loss: Secondary | ICD-10-CM | POA: Diagnosis not present

## 2024-01-05 DIAGNOSIS — K4090 Unilateral inguinal hernia, without obstruction or gangrene, not specified as recurrent: Secondary | ICD-10-CM | POA: Diagnosis not present

## 2024-01-05 DIAGNOSIS — R1033 Periumbilical pain: Secondary | ICD-10-CM | POA: Diagnosis not present

## 2024-01-05 DIAGNOSIS — K5730 Diverticulosis of large intestine without perforation or abscess without bleeding: Secondary | ICD-10-CM | POA: Diagnosis not present

## 2024-01-05 MED ORDER — IOHEXOL 300 MG/ML  SOLN
100.0000 mL | Freq: Once | INTRAMUSCULAR | Status: AC | PRN
  Administered 2024-01-05: 100 mL via INTRAVENOUS

## 2024-01-22 ENCOUNTER — Encounter: Payer: Self-pay | Admitting: Nurse Practitioner

## 2024-01-22 DIAGNOSIS — I700 Atherosclerosis of aorta: Secondary | ICD-10-CM | POA: Insufficient documentation

## 2024-02-04 ENCOUNTER — Encounter: Payer: Medicare Other | Attending: Physical Medicine and Rehabilitation | Admitting: Registered Nurse

## 2024-02-04 ENCOUNTER — Encounter: Payer: Self-pay | Admitting: Registered Nurse

## 2024-02-04 VITALS — BP 127/77 | HR 71 | Ht 67.0 in | Wt 155.0 lb

## 2024-02-04 DIAGNOSIS — W19XXXD Unspecified fall, subsequent encounter: Secondary | ICD-10-CM | POA: Insufficient documentation

## 2024-02-04 DIAGNOSIS — R2689 Other abnormalities of gait and mobility: Secondary | ICD-10-CM | POA: Diagnosis not present

## 2024-02-04 DIAGNOSIS — G8929 Other chronic pain: Secondary | ICD-10-CM | POA: Diagnosis not present

## 2024-02-04 DIAGNOSIS — Z5181 Encounter for therapeutic drug level monitoring: Secondary | ICD-10-CM

## 2024-02-04 DIAGNOSIS — Z79891 Long term (current) use of opiate analgesic: Secondary | ICD-10-CM | POA: Diagnosis not present

## 2024-02-04 DIAGNOSIS — Y92009 Unspecified place in unspecified non-institutional (private) residence as the place of occurrence of the external cause: Secondary | ICD-10-CM | POA: Diagnosis not present

## 2024-02-04 DIAGNOSIS — M1712 Unilateral primary osteoarthritis, left knee: Secondary | ICD-10-CM

## 2024-02-04 DIAGNOSIS — M1711 Unilateral primary osteoarthritis, right knee: Secondary | ICD-10-CM | POA: Diagnosis not present

## 2024-02-04 DIAGNOSIS — M5442 Lumbago with sciatica, left side: Secondary | ICD-10-CM | POA: Diagnosis not present

## 2024-02-04 DIAGNOSIS — G894 Chronic pain syndrome: Secondary | ICD-10-CM

## 2024-02-04 MED ORDER — TRAMADOL HCL 50 MG PO TABS: 50.0000 mg | ORAL_TABLET | Freq: Four times a day (QID) | ORAL | 1 refills | Status: DC

## 2024-02-04 MED ORDER — HYDROCODONE-ACETAMINOPHEN 5-325 MG PO TABS: 1.0000 | ORAL_TABLET | Freq: Every day | ORAL | 0 refills | Status: DC | PRN

## 2024-02-04 NOTE — Progress Notes (Unsigned)
 Subjective:    Patient ID: Ryan Craig, male    DOB: Aug 19, 1946, 78 y.o.   MRN: [id number]  HPI: Ryan Craig is a 78 y.o. male who returns for follow up appointment for chronic pain and medication refill. states *** pain is located in  ***. rates pain ***. current exercise regime is walking and performing stretching exercises.  Mr. Harlow Asa Morphine equivalent is *** MME.   Last UDS was Performed on 12/11/2023, it was consistent.      Pain Inventory Average Pain 8 Pain Right Now 6 My pain is intermittent, sharp, and aching  In the last 24 hours, has pain interfered with the following? General activity 0 Relation with others 0 Enjoyment of life 0 What TIME of day is your pain at its worst? evening Sleep (in general) Fair  Pain is worse with: walking and bending Pain improves with: medication Relief from Meds: 9  Family History  Problem Relation Age of Onset   Heart attack Mother    Cancer Maternal Aunt    Cancer Paternal Aunt    Cancer Cousin    Social History   Socioeconomic History   Marital status: Married    Spouse name: Not on file   Number of children: Not on file   Years of education: Not on file   Highest education level: Not on file  Occupational History   Occupation: retired  Tobacco Use   Smoking status: Former    Passive exposure: Past   Smokeless tobacco: Former   Tobacco comments:    quit smoking about 6-7ytrs ago  Advertising account planner   Vaping status: Never Used  Substance and Sexual Activity   Alcohol use: No    Comment: drank over 40 years ago   Drug use: Not Currently    Types: Hydrocodone   Sexual activity: Not Currently  Other Topics Concern   Not on file  Social History Narrative   Not on file   Social Drivers of Health   Financial Resource Strain: Low Risk  (12/26/2023)   Overall Financial Resource Strain (CARDIA)    Difficulty of Paying Living Expenses: Not hard at all  Food Insecurity: No Food Insecurity (12/26/2023)   Hunger Vital Sign     Worried About Running Out of Food in the Last Year: Never true    Ran Out of Food in the Last Year: Never true  Transportation Needs: No Transportation Needs (12/26/2023)   PRAPARE - Administrator, Civil Service (Medical): No    Lack of Transportation (Non-Medical): No  Physical Activity: Inactive (12/26/2023)   Exercise Vital Sign    Days of Exercise per Week: 0 days    Minutes of Exercise per Session: 0 min  Stress: No Stress Concern Present (12/26/2023)   [employer] of Occupational Health - Occupational Stress Questionnaire    Feeling of Stress : Not at all  Social Connections: Moderately Integrated (12/26/2023)   Social Connection and Isolation Panel [NHANES]    Frequency of Communication with Friends and Family: More than three times a week    Frequency of Social Gatherings with Friends and Family: Once a week    Attends Religious Services: More than 4 times per year    Active Member of [employer] or Organizations: No    Attends Banker Meetings: Never    Marital Status: Married   Past Surgical History:  Procedure Laterality Date   arthroscopic knee surgery Right    COLONOSCOPY     INGUINAL  HERNIA REPAIR Bilateral    x 4   JOINT REPLACEMENT Right 6 yrs ago   knee   KNEE ARTHROSCOPY Left 02/24/2014   Procedure: LEFT KNEE ARTHROSCOPY WITH PARTIAL MEDIAL MENISCECTOMY;  Surgeon: Arnie Lao, MD;  Location: [facility] OR;  Service: Orthopedics;  Laterality: Left;   LEFT HEART CATH AND CORONARY ANGIOGRAPHY N/A 07/22/2020   Procedure: LEFT HEART CATH AND CORONARY ANGIOGRAPHY;  Surgeon: Odie Benne, MD;  [facility];  Service: Cardiovascular;  Laterality: N/A;   TOTAL KNEE ARTHROPLASTY Left 12/22/2014   Procedure: LEFT TOTAL KNEE ARTHROPLASTY;  Surgeon: Arnie Lao, MD;  Location: [facility] OR;  Service: Orthopedics;  Laterality: Left;   Past Surgical History:  Procedure Laterality Date   arthroscopic knee surgery Right     COLONOSCOPY     INGUINAL HERNIA REPAIR Bilateral    x 4   JOINT REPLACEMENT Right 6 yrs ago   knee   KNEE ARTHROSCOPY Left 02/24/2014   Procedure: LEFT KNEE ARTHROSCOPY WITH PARTIAL MEDIAL MENISCECTOMY;  Surgeon: Arnie Lao, MD;  Location: [facility] OR;  Service: Orthopedics;  Laterality: Left;   LEFT HEART CATH AND CORONARY ANGIOGRAPHY N/A 07/22/2020   Procedure: LEFT HEART CATH AND CORONARY ANGIOGRAPHY;  Surgeon: Odie Benne, MD;  [facility];  Service: Cardiovascular;  Laterality: N/A;   TOTAL KNEE ARTHROPLASTY Left 12/22/2014   Procedure: LEFT TOTAL KNEE ARTHROPLASTY;  Surgeon: Arnie Lao, MD;  Location: [facility] OR;  Service: Orthopedics;  Laterality: Left;   Past Medical History:  Diagnosis Date   Anemia    no on any meds   Arthritis    Bifascicular block    Chronic back pain    deteriorating   Hyperlipidemia    Hypertension    Joint pain    Joint swelling    Mild CAD    Myocarditis (HCC)    PAT (paroxysmal atrial tachycardia) (HCC)    Premature atrial contractions    PVC's (premature ventricular contractions)    Thoracic aortic aneurysm (TAA) (HCC)    There were no vitals taken for this visit.  Opioid Risk Score:   Fall Risk Score:  `1  Depression screen [facility] 2/9     12/26/2023    9:08 AM 12/11/2023    8:49 AM 10/08/2023    9:01 AM 08/28/2023    8:53 AM 08/20/2023   10:59 AM 12/27/2022    8:27 AM 12/20/2022    8:32 AM  Depression screen PHQ 2/9  Decreased Interest 0 0 0 1 3 0 0  Down, Depressed, Hopeless 0 0 0 1 3 0 0  PHQ - 2 Score 0 0 0 2 6 0 0  Altered sleeping 0   0 3    Tired, decreased energy 0   1 2    Change in appetite 0   3 3    Feeling bad or failure about yourself  0   1 3    Trouble concentrating 0   0 2    Moving slowly or fidgety/restless 0   0 0    Suicidal thoughts 0   0 0    PHQ-9 Score 0   7 19    Difficult doing work/chores Not difficult at all   Not difficult at all       Review of Systems   Musculoskeletal:  Positive for back pain and gait problem.       Pain in both knees  All other systems  reviewed and are negative.      Objective:   Physical Exam        Assessment & Plan:  Chronic Bilateral low back pain with left sided sciatica: Continue Physical Therapy. Continue to Monitor. 12/11/2023 Impaired Gait Mobility: Continue to use assistive device with his cane. 12/11/2023 Chronic Pain Syndrome: Refilled: Hydrocodone 5/325 mg daily, using it sparingly, second script sent for the following month and  Tramadol 50 mg one tablet 4 times  a day as needed for pain.#120. May take two tablets twice a day, do not exceed 4 tablets daily.  We will continue the opioid monitoring program, this consists of regular clinic visits, examinations, urine drug screen, pill counts as well as use of [place]  Controlled Substance Reporting system. A 12 month History has been reviewed on the [place]  Controlled Substance Reporting System on 12/11/2023   F/U in 2 months

## 2024-02-12 ENCOUNTER — Other Ambulatory Visit: Payer: Self-pay | Admitting: Nurse Practitioner

## 2024-02-12 ENCOUNTER — Other Ambulatory Visit: Payer: Self-pay | Admitting: Cardiology

## 2024-02-12 DIAGNOSIS — R7989 Other specified abnormal findings of blood chemistry: Secondary | ICD-10-CM

## 2024-02-15 ENCOUNTER — Other Ambulatory Visit: Payer: Self-pay | Admitting: Nurse Practitioner

## 2024-02-15 DIAGNOSIS — I10 Essential (primary) hypertension: Secondary | ICD-10-CM

## 2024-03-02 ENCOUNTER — Other Ambulatory Visit: Payer: Self-pay | Admitting: Nurse Practitioner

## 2024-03-19 DIAGNOSIS — R630 Anorexia: Secondary | ICD-10-CM | POA: Diagnosis not present

## 2024-03-19 DIAGNOSIS — R634 Abnormal weight loss: Secondary | ICD-10-CM | POA: Diagnosis not present

## 2024-04-02 ENCOUNTER — Encounter: Attending: Physical Medicine and Rehabilitation | Admitting: Registered Nurse

## 2024-04-02 ENCOUNTER — Telehealth: Payer: Self-pay

## 2024-04-02 ENCOUNTER — Encounter: Payer: Self-pay | Admitting: Registered Nurse

## 2024-04-02 VITALS — BP 109/68 | HR 65 | Ht 67.0 in | Wt 154.8 lb

## 2024-04-02 DIAGNOSIS — M5442 Lumbago with sciatica, left side: Secondary | ICD-10-CM | POA: Diagnosis not present

## 2024-04-02 DIAGNOSIS — Z79891 Long term (current) use of opiate analgesic: Secondary | ICD-10-CM | POA: Insufficient documentation

## 2024-04-02 DIAGNOSIS — G894 Chronic pain syndrome: Secondary | ICD-10-CM | POA: Insufficient documentation

## 2024-04-02 DIAGNOSIS — M1711 Unilateral primary osteoarthritis, right knee: Secondary | ICD-10-CM | POA: Diagnosis not present

## 2024-04-02 DIAGNOSIS — G8929 Other chronic pain: Secondary | ICD-10-CM | POA: Diagnosis not present

## 2024-04-02 DIAGNOSIS — Z5181 Encounter for therapeutic drug level monitoring: Secondary | ICD-10-CM | POA: Insufficient documentation

## 2024-04-02 MED ORDER — TRAMADOL HCL 50 MG PO TABS: 50.0000 mg | ORAL_TABLET | Freq: Four times a day (QID) | ORAL | 1 refills | Status: DC

## 2024-04-02 NOTE — Telephone Encounter (Signed)
(  Key: [id number]) Rx #: [id number] PA submitted for tramadol

## 2024-04-02 NOTE — Progress Notes (Signed)
 Subjective:    Patient ID: Ryan Craig, male    DOB: Jul 18, 1946, 78 y.o.   MRN: [id number]  HPI: Ryan Craig is a 78 y.o. male who returns for follow up appointment for chronic pain and medication refill. He states his pain is located in his lower back and right knee pain. He rates his pain 8. His current exercise regime is walking and performing stretching exercises.  Ryan Craig Morphine  equivalent is  25. .   UDS ordered today.    Pain Inventory Average Pain 8 Pain Right Now 8 My pain is tingling  In the last 24 hours, has pain interfered with the following? General activity 2 Relation with others 0 Enjoyment of life 0 What TIME of day is your pain at its worst? morning  Sleep (in general) Fair  Pain is worse with: walking, bending, and some activites Pain improves with: rest, heat/ice, and medication Relief from Meds: 3  Family History  Problem Relation Age of Onset   Heart attack Mother    Cancer Maternal Aunt    Cancer Paternal Aunt    Cancer Cousin    Social History   Socioeconomic History   Marital status: Married    Spouse name: Not on file   Number of children: Not on file   Years of education: Not on file   Highest education level: Not on file  Occupational History   Occupation: retired  Tobacco Use   Smoking status: Former    Passive exposure: Past   Smokeless tobacco: Former   Tobacco comments:    quit smoking about 6-7ytrs ago  Advertising account planner   Vaping status: Never Used  Substance and Sexual Activity   Alcohol use: No    Comment: drank over 40 years ago   Drug use: Not Currently    Types: Hydrocodone    Sexual activity: Not Currently  Other Topics Concern   Not on file  Social History Narrative   Not on file   Social Drivers of Health   Financial Resource Strain: Low Risk  (12/26/2023)   Overall Financial Resource Strain (CARDIA)    Difficulty of Paying Living Expenses: Not hard at all  Food Insecurity: No Food Insecurity (12/26/2023)    Hunger Vital Sign    Worried About Running Out of Food in the Last Year: Never true    Ran Out of Food in the Last Year: Never true  Transportation Needs: No Transportation Needs (12/26/2023)   PRAPARE - Administrator, Civil Service (Medical): No    Lack of Transportation (Non-Medical): No  Physical Activity: Inactive (12/26/2023)   Exercise Vital Sign    Days of Exercise per Week: 0 days    Minutes of Exercise per Session: 0 min  Stress: No Stress Concern Present (12/26/2023)   [employer] of Occupational Health - Occupational Stress Questionnaire    Feeling of Stress : Not at all  Social Connections: Moderately Integrated (12/26/2023)   Social Connection and Isolation Panel [NHANES]    Frequency of Communication with Friends and Family: More than three times a week    Frequency of Social Gatherings with Friends and Family: Once a week    Attends Religious Services: More than 4 times per year    Active Member of [employer] or Organizations: No    Attends Banker Meetings: Never    Marital Status: Married   Past Surgical History:  Procedure Laterality Date   arthroscopic knee surgery Right    COLONOSCOPY  INGUINAL HERNIA REPAIR Bilateral    x 4   JOINT REPLACEMENT Right 6 yrs ago   knee   KNEE ARTHROSCOPY Left 02/24/2014   Procedure: LEFT KNEE ARTHROSCOPY WITH PARTIAL MEDIAL MENISCECTOMY;  Surgeon: Arnie Lao, MD;  Location: [facility] OR;  Service: Orthopedics;  Laterality: Left;   LEFT HEART CATH AND CORONARY ANGIOGRAPHY N/A 07/22/2020   Procedure: LEFT HEART CATH AND CORONARY ANGIOGRAPHY;  Surgeon: Odie Benne, MD;  [facility];  Service: Cardiovascular;  Laterality: N/A;   TOTAL KNEE ARTHROPLASTY Left 12/22/2014   Procedure: LEFT TOTAL KNEE ARTHROPLASTY;  Surgeon: Arnie Lao, MD;  Location: [facility] OR;  Service: Orthopedics;  Laterality: Left;   Past Surgical History:  Procedure Laterality Date   arthroscopic knee  surgery Right    COLONOSCOPY     INGUINAL HERNIA REPAIR Bilateral    x 4   JOINT REPLACEMENT Right 6 yrs ago   knee   KNEE ARTHROSCOPY Left 02/24/2014   Procedure: LEFT KNEE ARTHROSCOPY WITH PARTIAL MEDIAL MENISCECTOMY;  Surgeon: Arnie Lao, MD;  [facility] OR;  Service: Orthopedics;  Laterality: Left;   LEFT HEART CATH AND CORONARY ANGIOGRAPHY N/A 07/22/2020   Procedure: LEFT HEART CATH AND CORONARY ANGIOGRAPHY;  Surgeon: Odie Benne, MD;  [facility];  Service: Cardiovascular;  Laterality: N/A;   TOTAL KNEE ARTHROPLASTY Left 12/22/2014   Procedure: LEFT TOTAL KNEE ARTHROPLASTY;  Surgeon: Arnie Lao, MD;  Location: [facility] OR;  Service: Orthopedics;  Laterality: Left;   Past Medical History:  Diagnosis Date   Anemia    no on any meds   Arthritis    Bifascicular block    Chronic back pain    deteriorating   Hyperlipidemia    Hypertension    Joint pain    Joint swelling    Mild CAD    Myocarditis (HCC)    PAT (paroxysmal atrial tachycardia) (HCC)    Premature atrial contractions    PVC's (premature ventricular contractions)    Thoracic aortic aneurysm (TAA) (HCC)    BP 109/68 (BP Location: Left Arm, Patient Position: Sitting, Cuff Size: Normal)   Pulse 65   Ht 5' 7 (1.702 m)   Wt 154 lb 12.8 oz (70.2 kg)   SpO2 97%   BMI 24.25 kg/m   Opioid Risk Score:   Fall Risk Score:  `1  Depression screen [facility] 2/9     04/02/2024    9:01 AM 02/04/2024    9:01 AM 12/26/2023    9:08 AM 12/11/2023    8:49 AM 10/08/2023    9:01 AM 08/28/2023    8:53 AM 08/20/2023   10:59 AM  Depression screen PHQ 2/9  Decreased Interest 0 0 0 0 0 1 3  Down, Depressed, Hopeless 0 0 0 0 0 1 3  PHQ - 2 Score 0 0 0 0 0 2 6  Altered sleeping 0  0   0 3  Tired, decreased energy 0  0   1 2  Change in appetite 0  0   3 3  Feeling bad or failure about yourself  0  0   1 3  Trouble concentrating 0  0   0 2  Moving slowly or fidgety/restless 0  0   0 0   Suicidal thoughts 0  0   0 0  PHQ-9 Score 0  0   7 19  Difficult doing work/chores   Not difficult at all  Not difficult at all      Review of Systems  Musculoskeletal:  Positive for joint swelling.       Bilateral hip and knee pain  All other systems reviewed and are negative.      Objective:   Physical Exam Vitals and nursing note reviewed.  Constitutional:      Appearance: Normal appearance.  Cardiovascular:     Rate and Rhythm: Normal rate and regular rhythm.     Pulses: Normal pulses.     Heart sounds: Normal heart sounds.  Musculoskeletal:     Comments: Normal Muscle Bulk and Muscle Testing Reveals:  Upper Extremities: Decreased ROM 90 Degrees  and Muscle Strength  5/5  Lumbar Paraspinal Tenderness: L-4-L-5 Lower Extremities: Full ROM and Muscle Strength 5/5 Arises from Table slowly using cane for support Antalgic  Gait     Skin:    General: Skin is warm and dry.  Neurological:     Mental Status: He is alert and oriented to person, place, and time.  Psychiatric:        Mood and Affect: Mood normal.        Behavior: Behavior normal.         Assessment & Plan:  Chronic Bilateral low back pain with left sided sciatica: Continue HEP as tolerated. Continue to Monitor. 03/24/2024 Impaired Gait Mobility: Continue to use assistive device with his cane. 03/24/2024 Chronic Pain Syndrome: Continue : Hydrocodone  5/325 mg daily, using it sparingly, and  Refilled: Tramadol  50 mg one tablet 4 times  a day as needed for pain.#120. May take two tablets twice a day, do not exceed 4 tablets daily.  We will continue the opioid monitoring program, this consists of regular clinic visits, examinations, urine drug screen, pill counts as well as use of [place]  Controlled Substance Reporting system. A 12 month History has been reviewed on the [place]  Controlled Substance Reporting System on 03/24/2024 Right Knee Pain: Continue HEP as Tolerated. Continue to Monitor.  5.   Fall at Home subsequent encounter: No Falls this month Continue HEP as tolerated. Continue to Monitor.    F/U in 2 months

## 2024-04-03 NOTE — Telephone Encounter (Signed)
 Pt.notified

## 2024-04-03 NOTE — Telephone Encounter (Signed)
 Outcome Approved today by [facility] NCPDP 2017 Your PA request has been approved. Additional information will be provided in the approval communication. (Message 1145) Effective Date: 04/03/2024 Authorization Expiration Date: 10/03/2024

## 2024-04-04 NOTE — Telephone Encounter (Signed)
 Approved 04/03/24-10/03/24

## 2024-04-05 LAB — TOXASSURE SELECT,+ANTIDEPR,UR

## 2024-04-15 ENCOUNTER — Telehealth: Payer: Self-pay

## 2024-04-15 NOTE — Telephone Encounter (Signed)
 Patient called stating he is leaving on the 28th to go out of town and the pharmacy said you need to call and give the ok to pick up early because not suppose to pick up until the 30th.

## 2024-05-05 ENCOUNTER — Other Ambulatory Visit: Payer: Self-pay | Admitting: Nurse Practitioner

## 2024-06-02 ENCOUNTER — Other Ambulatory Visit: Payer: Self-pay | Admitting: Nurse Practitioner

## 2024-06-17 ENCOUNTER — Encounter: Attending: Physical Medicine and Rehabilitation | Admitting: Registered Nurse

## 2024-06-17 VITALS — BP 119/71 | Ht 67.0 in | Wt 153.0 lb

## 2024-06-17 DIAGNOSIS — Z5181 Encounter for therapeutic drug level monitoring: Secondary | ICD-10-CM | POA: Diagnosis not present

## 2024-06-17 DIAGNOSIS — M1711 Unilateral primary osteoarthritis, right knee: Secondary | ICD-10-CM | POA: Diagnosis not present

## 2024-06-17 DIAGNOSIS — Z79891 Long term (current) use of opiate analgesic: Secondary | ICD-10-CM | POA: Diagnosis not present

## 2024-06-17 DIAGNOSIS — M5442 Lumbago with sciatica, left side: Secondary | ICD-10-CM | POA: Diagnosis not present

## 2024-06-17 DIAGNOSIS — G894 Chronic pain syndrome: Secondary | ICD-10-CM

## 2024-06-17 DIAGNOSIS — G8929 Other chronic pain: Secondary | ICD-10-CM | POA: Diagnosis not present

## 2024-06-17 MED ORDER — HYDROCODONE-ACETAMINOPHEN 5-325 MG PO TABS: 1.0000 | ORAL_TABLET | Freq: Every day | ORAL | 0 refills | Status: DC | PRN

## 2024-06-17 NOTE — Progress Notes (Signed)
 Subjective:    Patient ID: Ryan Craig, male    DOB: 08/01/1946, 78 y.o.   MRN: [id number]  HPI: Ryan Craig is a 78 y.o. male who returns for follow up appointment for chronic pain and medication refill. He states his pain is located in his lower back and right knee pain. He rates his pain 9. His current exercise regime is walking and performing stretching exercises.  [name]  equivalent is 45.00 MME.   Last UDS was Performed on 04/02/2024, it was consistent for Tramadol , he uses his Hydrocodone  sparingly.     Pain Inventory Average Pain 8 Pain Right Now 9 My pain is aching  In the last 24 hours, has pain interfered with the following? General activity 9 Relation with others 0 Enjoyment of life 0 What TIME of day is your pain at its worst? daytime Sleep (in general) Fair  Pain is worse with: walking and standing Pain improves with: rest and medication Relief from Meds: 9  Family History  Problem Relation Age of Onset   Heart attack Mother    Cancer Maternal Aunt    Cancer Paternal Aunt    Cancer Cousin    Social History   Socioeconomic History   Marital status: Married    Spouse name: Not on file   Number of children: Not on file   Years of education: Not on file   Highest education level: Not on file  Occupational History   Occupation: retired  Tobacco Use   Smoking status: Former    Passive exposure: Past   Smokeless tobacco: Former   Tobacco comments:    quit smoking about 6-7ytrs ago  Advertising account planner   Vaping status: Never Used  Substance and Sexual Activity   Alcohol use: No    Comment: drank over 40 years ago   Drug use: Not Currently    Types: Hydrocodone    Sexual activity: Not Currently  Other Topics Concern   Not on file  Social History Narrative   Not on file   Social Drivers of Health   Financial Resource Strain: Low Risk  (12/26/2023)   Overall Financial Resource Strain (CARDIA)    Difficulty of Paying Living Expenses: Not hard at  all  Food Insecurity: No Food Insecurity (12/26/2023)   Hunger Vital Sign    Worried About Running Out of Food in the Last Year: Never true    Ran Out of Food in the Last Year: Never true  Transportation Needs: No Transportation Needs (12/26/2023)   PRAPARE - Administrator, Civil Service (Medical): No    Lack of Transportation (Non-Medical): No  Physical Activity: Inactive (12/26/2023)   Exercise Vital Sign    Days of Exercise per Week: 0 days    Minutes of Exercise per Session: 0 min  Stress: No Stress Concern Present (12/26/2023)   [employer] of Occupational Health - Occupational Stress Questionnaire    Feeling of Stress : Not at all  Social Connections: Moderately Integrated (12/26/2023)   Social Connection and Isolation Panel    Frequency of Communication with Friends and Family: More than three times a week    Frequency of Social Gatherings with Friends and Family: Once a week    Attends Religious Services: More than 4 times per year    Active Member of [employer] or Organizations: No    Attends Banker Meetings: Never    Marital Status: Married   Past Surgical History:  Procedure Laterality Date  arthroscopic knee surgery Right    COLONOSCOPY     INGUINAL HERNIA REPAIR Bilateral    x 4   JOINT REPLACEMENT Right 6 yrs ago   knee   KNEE ARTHROSCOPY Left 02/24/2014   Procedure: LEFT KNEE ARTHROSCOPY WITH PARTIAL MEDIAL MENISCECTOMY;  Surgeon: Lonni CINDERELLA Poli, MD;  Location: [facility] OR;  Service: Orthopedics;  Laterality: Left;   LEFT HEART CATH AND CORONARY ANGIOGRAPHY N/A 07/22/2020   Procedure: LEFT HEART CATH AND CORONARY ANGIOGRAPHY;  Surgeon: Verlin Lonni BIRCH, MD;  [facility];  Service: Cardiovascular;  Laterality: N/A;   TOTAL KNEE ARTHROPLASTY Left 12/22/2014   Procedure: LEFT TOTAL KNEE ARTHROPLASTY;  Surgeon: Lonni CINDERELLA Poli, MD;  Location: [facility] OR;  Service: Orthopedics;  Laterality: Left;   Past Surgical History:   Procedure Laterality Date   arthroscopic knee surgery Right    COLONOSCOPY     INGUINAL HERNIA REPAIR Bilateral    x 4   JOINT REPLACEMENT Right 6 yrs ago   knee   KNEE ARTHROSCOPY Left 02/24/2014   Procedure: LEFT KNEE ARTHROSCOPY WITH PARTIAL MEDIAL MENISCECTOMY;  Surgeon: Lonni CINDERELLA Poli, MD;  [facility] OR;  Service: Orthopedics;  Laterality: Left;   LEFT HEART CATH AND CORONARY ANGIOGRAPHY N/A 07/22/2020   Procedure: LEFT HEART CATH AND CORONARY ANGIOGRAPHY;  Surgeon: Verlin Lonni BIRCH, MD;  [facility];  Service: Cardiovascular;  Laterality: N/A;   TOTAL KNEE ARTHROPLASTY Left 12/22/2014   Procedure: LEFT TOTAL KNEE ARTHROPLASTY;  Surgeon: Lonni CINDERELLA Poli, MD;  Location: [facility] OR;  Service: Orthopedics;  Laterality: Left;   Past Medical History:  Diagnosis Date   Anemia    no on any meds   Arthritis    Bifascicular block    Chronic back pain    deteriorating   Hyperlipidemia    Hypertension    Joint pain    Joint swelling    Mild CAD    Myocarditis (HCC)    PAT (paroxysmal atrial tachycardia) (HCC)    Premature atrial contractions    PVC's (premature ventricular contractions)    Thoracic aortic aneurysm (TAA) (HCC)    BP 119/71   Ht 5' 7 (1.702 m)   Wt 153 lb (69.4 kg)   SpO2 94%   BMI 23.96 kg/m   Opioid Risk Score:   Fall Risk Score:  `1  Depression screen [facility] 2/9     04/02/2024    9:01 AM 02/04/2024    9:01 AM 12/26/2023    9:08 AM 12/11/2023    8:49 AM 10/08/2023    9:01 AM 08/28/2023    8:53 AM 08/20/2023   10:59 AM  Depression screen PHQ 2/9  Decreased Interest 0 0 0 0 0 1 3  Down, Depressed, Hopeless 0 0 0 0 0 1 3  PHQ - 2 Score 0 0 0 0 0 2 6  Altered sleeping 0  0   0 3  Tired, decreased energy 0  0   1 2  Change in appetite 0  0   3 3  Feeling bad or failure about yourself  0  0   1 3  Trouble concentrating 0  0   0 2  Moving slowly or fidgety/restless 0  0   0 0  Suicidal thoughts 0  0   0 0  PHQ-9 Score 0  0    7 19  Difficult doing work/chores   Not difficult at all   Not difficult at all  Review of Systems  Musculoskeletal:        B/L hip pain Right knee pain       Objective:   Physical Exam Vitals and nursing note reviewed.  Constitutional:      Appearance: Normal appearance.  Cardiovascular:     Rate and Rhythm: Normal rate and regular rhythm.     Pulses: Normal pulses.     Heart sounds: Normal heart sounds.  Pulmonary:     Effort: Pulmonary effort is normal.     Breath sounds: Normal breath sounds.  Musculoskeletal:     Comments: Normal Muscle Bulk and Muscle Testing Reveals:  Upper Extremities: Full ROM and Muscle Strength 5/5  Lumbar Paraspinal Tenderness: L-4-L-5 Lower Extremities: Right: Decreased ROM and Muscle Strength 5/5 Right Lower Extremity Flexion Produces Pain into his Right Patella Left Lower Extremity: Full ROM and Muscle Strength 5/5 Arises from Table slowly using cane for support Antalgic  Gait     Skin:    General: Skin is warm and dry.  Neurological:     Mental Status: He is alert and oriented to person, place, and time.  Psychiatric:        Mood and Affect: Mood normal.        Behavior: Behavior normal.          Assessment & Plan:  Chronic Bilateral low back pain with left sided sciatica: Continue HEP as tolerated. Continue to Monitor. 06/17/2024 Impaired Gait Mobility: Continue to use assistive device with his cane. 06/17/2024 Chronic Pain Syndrome: Continue : Hydrocodone  5/325 mg daily, using it sparingly, and  Refilled: Tramadol  50 mg one tablet 4 times  a day as needed for pain.#120. May take two tablets twice a day, do not exceed 4 tablets daily.  We will continue the opioid monitoring program, this consists of regular clinic visits, examinations, urine drug screen, pill counts as well as use of [place]  Controlled Substance Reporting system. A 12 month History has been reviewed on the [place]  Controlled Substance Reporting  System on 06/17/2024 Right Knee Pain: Continue HEP as Tolerated. Continue to Monitor. 06/17/2024 5.  Fall at Home subsequent encounter: No Falls this month Continue HEP as tolerated. Continue to Monitor. 06/17/2024   F/U in 2 months

## 2024-06-18 ENCOUNTER — Other Ambulatory Visit (HOSPITAL_COMMUNITY): Payer: Self-pay | Admitting: Gastroenterology

## 2024-06-18 DIAGNOSIS — R6881 Early satiety: Secondary | ICD-10-CM | POA: Diagnosis not present

## 2024-06-18 DIAGNOSIS — R1084 Generalized abdominal pain: Secondary | ICD-10-CM

## 2024-06-18 DIAGNOSIS — R634 Abnormal weight loss: Secondary | ICD-10-CM

## 2024-06-22 ENCOUNTER — Encounter: Payer: Self-pay | Admitting: Registered Nurse

## 2024-06-24 ENCOUNTER — Telehealth: Payer: Self-pay | Admitting: Cardiology

## 2024-06-24 DIAGNOSIS — R6881 Early satiety: Secondary | ICD-10-CM | POA: Diagnosis not present

## 2024-06-24 MED ORDER — METOPROLOL SUCCINATE ER 25 MG PO TB24: 25.0000 mg | ORAL_TABLET | Freq: Every day | ORAL | 0 refills | Status: DC

## 2024-06-24 NOTE — Telephone Encounter (Signed)
*  STAT* If patient is at the pharmacy, call can be transferred to refill team.   1. Which medications need to be refilled? (please list name of each medication and dose if known) metoprolol  succinate (TOPROL -XL) 25 MG 24 hr tablet [[id number]] (completely out)    2. Would you like to learn more about the convenience, safety, & potential cost savings by using the [facility]? na    3. Are you open to using the [facility] (Type [facility]. Na   4. Which pharmacy/location (including street and city if local pharmacy) [facility] [address]    5. Do they need a 30 day or 90 day supply? 90

## 2024-06-24 NOTE — Telephone Encounter (Signed)
 RX sent in

## 2024-06-27 ENCOUNTER — Ambulatory Visit (HOSPITAL_COMMUNITY)
Admission: RE | Admit: 2024-06-27 | Discharge: 2024-06-27 | Disposition: A | Discharge status: Home / Self Care | Source: Ambulatory Visit | Attending: Gastroenterology | Admitting: Gastroenterology

## 2024-06-27 ENCOUNTER — Encounter (HOSPITAL_COMMUNITY): Payer: Self-pay

## 2024-06-27 DIAGNOSIS — R1084 Generalized abdominal pain: Secondary | ICD-10-CM | POA: Insufficient documentation

## 2024-06-27 DIAGNOSIS — R634 Abnormal weight loss: Secondary | ICD-10-CM | POA: Insufficient documentation

## 2024-06-27 MED ORDER — TECHNETIUM TC 99M SULFUR COLLOID
2.2000 | Freq: Once | INTRAVENOUS | Status: AC
  Administered 2024-06-27: 2.2 via ORAL

## 2024-06-30 NOTE — Progress Notes (Signed)
 LILLETTE Kristeen JINNY Gladis, CMA,acting as a Neurosurgeon for [name], FNP.,have documented all relevant documentation on the behalf of [name], FNP,as directed by  [name], FNP while in the presence of [name], FNP.  Subjective:  Patient ID: Ryan Craig , male    DOB: 04-Nov-1945 , 78 y.o.   MRN: [id number]  Chief Complaint  Patient presents with   Hypertension    Patient presents today for a bp and CHOL follow up, Patient reports compliance with medication. Patient denies any chest pain, SOB, or headaches. Patient has no concerns today.     HPI Discussed the use of AI scribe software for clinical note transcription with the patient, who gave verbal consent to proceed.  History of Present Illness Ryan Craig is an 78 year old male with hypertension who presents for a blood pressure follow-up and evaluation of stomach issues. He is accompanied by his wife.  He did not eat anything before the visit and had coffee in the morning. He does not require refills for his heart medication as they were recently sent to him.  He experiences ongoing stomach pain that starts from his stomach and extends past his navel. He has lost significant weight, from 220 pounds to 145 pounds over the past year, partly due to changes in his eating habits as he is unable to eat as much as before. He underwent a procedure last week, and the results were communicated as normal. He has not discussed the use of Pepcid yet. He continues to be followed by Dr. Rollin  He has a history of arthritis, which causes popping sounds in his joints, particularly when he moves. He uses a red arthritis alcohol for relief, which he applies to his back and other areas. He recently completed physical therapy for his back and knee.  He is currently taking heart medication and is awaiting a prescription for rosuvastatin , which was initially prescribed last year. The patient expressed concern about the possibility of cancer but did not  report any specific symptoms suggestive of cancer. The patient reports that his heart is still doing okay.   HPI   Past Medical History:  Diagnosis Date   Anemia    no on any meds   Arthritis    Bifascicular block    Chronic back pain    deteriorating   Hyperlipidemia    Hypertension    Joint pain    Joint swelling    Mild CAD    Myocarditis (HCC)    PAT (paroxysmal atrial tachycardia) (HCC)    Premature atrial contractions    PVC's (premature ventricular contractions)    Thoracic aortic aneurysm (TAA) (HCC)      Family History  Problem Relation Age of Onset   Heart attack Mother    Cancer Maternal Aunt    Cancer Paternal Aunt    Cancer Cousin      Current Outpatient Medications:    acetaminophen  (TYLENOL ) 500 MG tablet, Take 500 mg by mouth every 6 (six) hours as needed., Disp: , Rfl:    amLODipine  (NORVASC ) 10 MG tablet, TAKE 1 TABLET BY MOUTH EVERY DAY, Disp: 90 tablet, Rfl: 3   aspirin  EC 81 MG tablet, Take 81 mg by mouth daily., Disp: , Rfl:    BLACK CURRANT SEED OIL PO, Take by mouth., Disp: , Rfl:    Cholecalciferol (VITAMIN D -3 PO), Take 2 tablets by mouth daily. , Disp: , Rfl:    HYDROcodone -acetaminophen  (NORCO) 5-325 MG tablet, Take 1 tablet by  mouth daily as needed for severe pain (pain score 7-10). Use as needed once daily for breakthrough pain; you are only receiving 15 tablets for the month and are responsible for budgeting them; early refills of this medication will not be permitted. Do Not Fill Before 07/15/2024, Disp: 15 tablet, Rfl: 0   lisinopril  (ZESTRIL ) 10 MG tablet, TAKE 1 TABLET BY MOUTH EVERY DAY, Disp: 90 tablet, Rfl: 1   metoprolol  succinate (TOPROL -XL) 25 MG 24 hr tablet, Take 1 tablet (25 mg total) by mouth daily., Disp: 30 tablet, Rfl: 0   traMADol  (ULTRAM ) 50 MG tablet, Take 1 tablet (50 mg total) by mouth 4 (four) times daily. You may take up to 2 tablets at once, but do not exceed 4 tablets daily, Disp: 120 tablet, Rfl: 1   triamcinolone   cream (KENALOG ) 0.1 %, APPLY TOPICALLY A THIN LAYER TO THE AFFECTED AREA(S) TWICE A DAY, Disp: 454 g, Rfl: 0   vitamin B-12 (CYANOCOBALAMIN ) 100 MCG tablet, TAKE 1 TABLET BY MOUTH EVERY DAY, Disp: 90 tablet, Rfl: 1   rosuvastatin  (CRESTOR ) 40 MG tablet, Take 1 tablet (40 mg total) by mouth daily., Disp: 90 tablet, Rfl: 0   Allergies  Allergen Reactions   Contrast Media [Iodinated Contrast Media] Anaphylaxis   Latex Rash     Review of Systems  Constitutional:  Negative for unexpected weight change.  Eyes: Negative.   Respiratory:  Negative for shortness of breath.   Cardiovascular:  Negative for chest pain and palpitations.  Musculoskeletal: Negative.   Skin: Negative.   Neurological:  Negative for headaches.  Psychiatric/Behavioral: Negative.       Today's Vitals   07/01/24 0922  BP: 110/60  Pulse: 71  Temp: 97.8 F (36.6 C)  TempSrc: Oral  Weight: 154 lb 9.6 oz (70.1 kg)  Height: 5' 7 (1.702 m)  PainSc: 0-No pain   Body mass index is 24.21 kg/m.  Wt Readings from Last 3 Encounters:  07/08/24 154 lb (69.9 kg)  07/01/24 154 lb 9.6 oz (70.1 kg)  06/17/24 153 lb (69.4 kg)      Objective:  Physical Exam Vitals and nursing note reviewed.  Constitutional:      Appearance: Normal appearance.  HENT:     Head: Normocephalic and atraumatic.  Eyes:     Conjunctiva/sclera: Conjunctivae normal.     Pupils: Pupils are equal, round, and reactive to light.  Cardiovascular:     Rate and Rhythm: Normal rate and regular rhythm.     Pulses: Normal pulses.     Heart sounds: Normal heart sounds. No murmur heard. Pulmonary:     Effort: Pulmonary effort is normal. No respiratory distress.     Breath sounds: Normal breath sounds. No wheezing.  Musculoskeletal:     Cervical back: Normal range of motion.  Skin:    General: Skin is warm and dry.     Capillary Refill: Capillary refill takes 2 to 3 seconds.  Neurological:     General: No focal deficit present.     Mental Status:  He is alert and oriented to person, place, and time.     Cranial Nerves: No cranial nerve deficit.  Psychiatric:        Mood and Affect: Mood normal.        Behavior: Behavior normal.        Thought Content: Thought content normal.        Assessment And Plan:  Hypertensive heart disease without heart failure Assessment & Plan: Blood pressure is well  controlled.  Advised to focus on lifestyle modifications. Continue current medications. Continue statin   Orders: -     BMP8+eGFR  Aortic atherosclerosis (HCC) Assessment & Plan: Continue statin   Pure hypercholesterolemia Assessment & Plan: Cholesterol levels are stable.  Continue statin, tolerating well and follow-up with cardiology  Orders: -     Lipid panel -     Rosuvastatin  Calcium ; Take 1 tablet (40 mg total) by mouth daily.  Dispense: 90 tablet; Refill: 0  Herpes zoster vaccination declined Assessment & Plan: Declines shingrix, educated on disease process and is aware if he changes his mind to notify office    COVID-19 vaccination declined  Influenza vaccination declined    Return for KEEP SAME NEXT.  Patient was given opportunity to ask questions. Patient verbalized understanding of the plan and was able to repeat key elements of the plan. All questions were answered to their satisfaction.   LILLETTE [name], FNP, have reviewed all documentation for this visit. The documentation on 07/01/24 for the exam, diagnosis, procedures, and orders are all accurate and complete.   IF YOU HAVE BEEN REFERRED TO A SPECIALIST, IT MAY TAKE 1-2 WEEKS TO SCHEDULE/PROCESS THE REFERRAL. IF YOU HAVE NOT HEARD FROM US /SPECIALIST IN TWO WEEKS, PLEASE GIVE US  A CALL AT [phone] X [phone].

## 2024-07-01 ENCOUNTER — Ambulatory Visit (INDEPENDENT_AMBULATORY_CARE_PROVIDER_SITE_OTHER): Admitting: Nurse Practitioner

## 2024-07-01 ENCOUNTER — Encounter: Payer: Self-pay | Admitting: Nurse Practitioner

## 2024-07-01 VITALS — BP 110/60 | HR 71 | Temp 97.8°F | Ht 67.0 in | Wt 154.6 lb

## 2024-07-01 DIAGNOSIS — Z2821 Immunization not carried out because of patient refusal: Secondary | ICD-10-CM | POA: Diagnosis not present

## 2024-07-01 DIAGNOSIS — I10 Essential (primary) hypertension: Secondary | ICD-10-CM | POA: Diagnosis not present

## 2024-07-01 DIAGNOSIS — I700 Atherosclerosis of aorta: Secondary | ICD-10-CM | POA: Diagnosis not present

## 2024-07-01 DIAGNOSIS — I119 Hypertensive heart disease without heart failure: Secondary | ICD-10-CM

## 2024-07-01 DIAGNOSIS — E7800 Pure hypercholesterolemia, unspecified: Secondary | ICD-10-CM

## 2024-07-01 LAB — BMP8+EGFR
BUN/Creatinine Ratio: 18 (ref 10–24)
BUN: 16 mg/dL (ref 8–27)
CO2: 28 mmol/L (ref 20–29)
Calcium: 9.9 mg/dL (ref 8.6–10.2)
Chloride: 101 mmol/L (ref 96–106)
Creatinine, Ser: 0.89 mg/dL (ref 0.76–1.27)
Glucose: 110 mg/dL — ABNORMAL HIGH (ref 70–99)
Potassium: 5 mmol/L (ref 3.5–5.2)
Sodium: 142 mmol/L (ref 134–144)
eGFR: 87 mL/min/1.73 (ref >59)

## 2024-07-01 LAB — LIPID PANEL
Chol/HDL Ratio: 3 ratio (ref 0.0–5.0)
Cholesterol, Total: 194 mg/dL (ref 100–199)
HDL: 65 mg/dL (ref >39)
LDL Chol Calc (NIH): 111 mg/dL — ABNORMAL HIGH (ref 0–99)
Triglycerides: 100 mg/dL (ref 0–149)
VLDL Cholesterol Cal: 18 mg/dL (ref 5–40)

## 2024-07-01 MED ORDER — ROSUVASTATIN CALCIUM 40 MG PO TABS: 40.0000 mg | ORAL_TABLET | Freq: Every day | ORAL | 0 refills | Status: DC

## 2024-07-08 ENCOUNTER — Ambulatory Visit: Payer: Self-pay | Admitting: Nurse Practitioner

## 2024-07-08 ENCOUNTER — Ambulatory Visit (INDEPENDENT_AMBULATORY_CARE_PROVIDER_SITE_OTHER): Payer: Self-pay

## 2024-07-08 VITALS — BP 112/64 | HR 70 | Temp 98.6°F | Ht 67.0 in | Wt 154.0 lb

## 2024-07-08 DIAGNOSIS — Z23 Encounter for immunization: Secondary | ICD-10-CM | POA: Diagnosis not present

## 2024-07-08 DIAGNOSIS — Z2821 Immunization not carried out because of patient refusal: Secondary | ICD-10-CM | POA: Insufficient documentation

## 2024-07-08 NOTE — Assessment & Plan Note (Signed)
 Blood pressure is well controlled.  Advised to focus on lifestyle modifications. Continue current medications. Continue statin

## 2024-07-08 NOTE — Assessment & Plan Note (Signed)
 Continue statin

## 2024-07-08 NOTE — Assessment & Plan Note (Signed)
Cholesterol levels are stable.  Continue statin, tolerating well and follow-up with cardiology

## 2024-07-08 NOTE — Progress Notes (Cosign Needed Addendum)
 Patient is in office today for a nurse visit for flu Immunization. Patient Injection was given in the  Left deltoid. Patient tolerated injection well.

## 2024-07-08 NOTE — Assessment & Plan Note (Signed)
 Declines shingrix , educated on disease process and is aware if he changes his mind to notify office

## 2024-07-09 ENCOUNTER — Telehealth: Payer: Self-pay

## 2024-07-09 NOTE — Telephone Encounter (Signed)
 Copied from CRM [id number]. Topic: Clinical - Medical Advice >> Jul 09, 2024  2:53 PM Debby BROCKS wrote: Reason for CRM: Patient received a phone call today from a nurse that was speaking to him about his eating habits but he forgot the information from that call. He would like to get called again on 07/10/2024 to go over the foods as he will be out for the rest of the day today

## 2024-07-17 ENCOUNTER — Other Ambulatory Visit: Payer: Self-pay | Admitting: Cardiology

## 2024-07-22 ENCOUNTER — Other Ambulatory Visit: Payer: Self-pay | Admitting: Nurse Practitioner

## 2024-07-22 DIAGNOSIS — E7800 Pure hypercholesterolemia, unspecified: Secondary | ICD-10-CM

## 2024-07-28 NOTE — Progress Notes (Signed)
 Cardiology Office Note:  .   Date:  08/05/2024  ID:  Ryan Craig, DOB December 06, 1945, MRN [id number] PCP: Georgina Speaks, FNP  [facility] Providers Cardiologist:  Oneil Parchment, MD    History of Present Illness: .   Ryan Craig is a 78 y.o. male with history of nonobstructive CAD on prior heart catheterization 2021 20% lesion, cardiac MRI myocarditis. Beta-blockers were started aorta was dilated 45 mm sinus of Valsalva normal ascending 38. Possible old iodinated contrast allergy with hives or itching.  Patient here with his wife. Having a lot of stomach issues with nausea and has lost 70 lbs in the past year. Seeing Dr. Rollin, GI. Also arthritis. Denies chest pain, palpitations, dyspnea, edema. His wife says he breaths hard when he walks. He works in his garden some but no regular exercise. Has back and knee issues so usually rides a cart when he goes shopping. On tramadol  for pain. BP running low. Sometimes takes his meds every other day instead of daily.  ROS:    Studies Reviewed: SABRA    EKG Interpretation Date/Time:  Tuesday August 05 2024 09:03:51 EDT Ventricular Rate:  72 PR Interval:    QRS Duration:  118 QT Interval:  436 QTC Calculation: 477 R Axis:   -75  Text Interpretation: NSR Premature ventricular complexes Right bundle branch block Left anterior fascicular block Bifascicular block When compared with ECG of 17-Apr-2023 08:48, Confirmed by Ryan Craig [phone]) on 08/05/2024 9:25:43 AM    Prior CV Studies:    Echo 02/2023 IMPRESSIONS     1. Left ventricular ejection fraction, by estimation, is 55 to 60%. The  left ventricle has normal function. The left ventricle has no regional  wall motion abnormalities. There is mild left ventricular hypertrophy.  Left ventricular diastolic parameters  are consistent with Grade I diastolic dysfunction (impaired relaxation).   2. Right ventricular systolic function is normal. The right ventricular  size is normal. There is  normal pulmonary artery systolic pressure. The  estimated right ventricular systolic pressure is 30.9 mmHg.   3. The mitral valve is normal in structure. Trivial mitral valve  regurgitation. No evidence of mitral stenosis.   4. The aortic valve is tricuspid. Aortic valve regurgitation is mild. No  aortic stenosis is present.   5. Aortic dilatation noted. There is dilatation of the aortic root,  measuring 43 mm. There is dilatation of the ascending aorta, measuring 40  mm.   6. The inferior vena cava is normal in size with greater than 50%  respiratory variability, suggesting right atrial pressure of 3 mmHg.   MR cardiac 2022 IMPRESSION: 1. Motion artifact reduces the diagnostic quality of this study.   2. On side by side comparison of images, there is no significant change in LVEF, wall motion, or pattern of delayed enhancement.   3. Normal LV size and systolic function. LVEF 61%. Mild hypokinesis of basal inferoseptum and basal-mid inferior wall.   4. No myocardial edema. Mild focal delayed enhancement as noted in findings. Visually similar distribution to prior study. Findings suggest stable features of prior myocarditis with no definite active inflammation.   5. Tricuspid aortic valve with visually estimated moderate aortic valve regurgitation, recommend correlation with echo.   6. Moderately dilated ascending aorta, 45 mm at sinus of Valsalva and 40 mm at mid ascending aorta. These measurements are not orthogonal but appear grossly stable compared to prior study.     Electronically Signed   By: Ryan  Craig   On:  12/13/2020 08:54   Risk Assessment/Calculations:             Physical Exam:   VS:  BP (!) 106/56 (BP Location: Left Arm, Patient Position: Sitting, Cuff Size: Normal)   Pulse 72   Ht 5' 7 (1.702 m)   Wt 143 lb (64.9 kg)   BMI 22.40 kg/m    Orhtostatics: No data found. Wt Readings from Last 3 Encounters:  08/05/24 143 lb (64.9 kg)  07/08/24 154  lb (69.9 kg)  07/01/24 154 lb 9.6 oz (70.1 kg)    GEN: Thin, elderly in no acute distress NECK: No JVD; No carotid bruits CARDIAC:  RRR, some skipping, 2/6 systolic murmur LSB RESPIRATORY:  Clear to auscultation without rales, wheezing or rhonchi  ABDOMEN: Soft, non-tender, non-distended EXTREMITIES:  No edema; No deformity   ASSESSMENT AND PLAN: .    Nonobstructive CAD - 20% lesion in 2021 cath.  Continue with medication management on crestor . No chest pain. Some DOE but has become inactive and suspect is due to deconditioning.  HTN-BP running low most likely due to weight loss. Decrease amlodipine  5 mg daily.   Mild AR/ trivial MR - continue to monitor   Hyperlipidemia - On Crestor  40 mg, LDL 111 06/2024 but wasn't taking crestor  then. He is back on. Reiterated to him to take daily. FLP in 3 months  Myocarditis-resolved - MRI.  Stable.  No further symptoms.  No chest pain.  No arrhythmias.  Rare AC   Dilated sinus of Valsalva - Most recent MRI on 03/26/2023 showed 4 cm.  Relatively normal.  Ascending aorta was normal.  Reassurance.  RBBB/LBBB/PVC's on metoprolol . asymptomatic  Unintentional weight loss - 70 lbs in past year. GI w/u unremarkable. Encouraged him to drink boost/ensure and eat regularly. F/u with PCP/GI            Dispo: f/u in 1 yr  Signed, Ryan Pavy, PA-C

## 2024-08-05 ENCOUNTER — Ambulatory Visit: Attending: Physician Assistant | Admitting: Physician Assistant

## 2024-08-05 ENCOUNTER — Encounter: Payer: Self-pay | Admitting: Physician Assistant

## 2024-08-05 VITALS — BP 106/56 | HR 72 | Ht 67.0 in | Wt 143.0 lb

## 2024-08-05 DIAGNOSIS — I10 Essential (primary) hypertension: Secondary | ICD-10-CM | POA: Diagnosis not present

## 2024-08-05 DIAGNOSIS — Q233 Congenital mitral insufficiency: Secondary | ICD-10-CM | POA: Diagnosis not present

## 2024-08-05 DIAGNOSIS — I4510 Unspecified right bundle-branch block: Secondary | ICD-10-CM | POA: Diagnosis present

## 2024-08-05 DIAGNOSIS — E782 Mixed hyperlipidemia: Secondary | ICD-10-CM | POA: Insufficient documentation

## 2024-08-05 DIAGNOSIS — R634 Abnormal weight loss: Secondary | ICD-10-CM | POA: Diagnosis not present

## 2024-08-05 DIAGNOSIS — I2510 Atherosclerotic heart disease of native coronary artery without angina pectoris: Secondary | ICD-10-CM | POA: Insufficient documentation

## 2024-08-05 DIAGNOSIS — I444 Left anterior fascicular block: Secondary | ICD-10-CM | POA: Diagnosis present

## 2024-08-05 DIAGNOSIS — I2583 Coronary atherosclerosis due to lipid rich plaque: Secondary | ICD-10-CM | POA: Insufficient documentation

## 2024-08-05 DIAGNOSIS — Z8679 Personal history of other diseases of the circulatory system: Secondary | ICD-10-CM | POA: Insufficient documentation

## 2024-08-05 MED ORDER — METOPROLOL SUCCINATE ER 25 MG PO TB24: 25.0000 mg | ORAL_TABLET | Freq: Every day | ORAL | 3 refills | Status: AC

## 2024-08-05 MED ORDER — METOPROLOL SUCCINATE ER 25 MG PO TB24: 25.0000 mg | ORAL_TABLET | Freq: Every day | ORAL | 3 refills | Status: DC

## 2024-08-05 MED ORDER — AMLODIPINE BESYLATE 5 MG PO TABS: 5.0000 mg | ORAL_TABLET | Freq: Every day | ORAL | 3 refills | Status: AC

## 2024-08-05 MED ORDER — AMLODIPINE BESYLATE 5 MG PO TABS: 5.0000 mg | ORAL_TABLET | Freq: Every day | ORAL | 3 refills | Status: DC

## 2024-08-05 NOTE — Patient Instructions (Addendum)
 Medication Instructions:  DECREASE AMLODIPINE  TO 5 MG DAILY.  PLEASE BE SURE TO TAKE ALL CARDIAC MEDICATIONS AS PRESCRIBED.  Lab Work:  FASTING LIPID PANEL TO BE DONE IN 3 MONTHS. (AROUND November 05, 2024).   Testing/Procedures: NONE  Follow-Up: At [facility], you and your health needs are our priority.  As part of our continuing mission to provide you with exceptional heart care, our providers are all part of one team.  This team includes your primary Cardiologist (physician) and Advanced Practice Providers or APPs (Physician Assistants and Nurse Practitioners) who all work together to provide you with the care you need, when you need it.  Your next appointment:   1 YEAR  Provider:   Oneil Parchment, MD    We recommend signing up for the patient portal called MyChart.  Sign up information is provided on this After Visit Summary.  MyChart is used to connect with patients for Virtual Visits (Telemedicine).  Patients are able to view lab/test results, encounter notes, upcoming appointments, etc.  Non-urgent messages can be sent to your provider as well.   To learn more about what you can do with MyChart, go to [email or web address].

## 2024-08-05 NOTE — Addendum Note (Signed)
 Addended by: BILLY CAMELIA CROME on: 08/05/2024 09:59 AM   Modules accepted: Orders

## 2024-08-19 ENCOUNTER — Encounter: Payer: Self-pay | Admitting: Registered Nurse

## 2024-08-19 ENCOUNTER — Telehealth: Payer: Self-pay | Admitting: Cardiology

## 2024-08-19 ENCOUNTER — Encounter: Attending: Physical Medicine and Rehabilitation | Admitting: Registered Nurse

## 2024-08-19 VITALS — BP 105/69 | HR 73 | Ht 67.0 in | Wt 148.0 lb

## 2024-08-19 DIAGNOSIS — M1711 Unilateral primary osteoarthritis, right knee: Secondary | ICD-10-CM | POA: Insufficient documentation

## 2024-08-19 DIAGNOSIS — Z79891 Long term (current) use of opiate analgesic: Secondary | ICD-10-CM | POA: Diagnosis not present

## 2024-08-19 DIAGNOSIS — Z5181 Encounter for therapeutic drug level monitoring: Secondary | ICD-10-CM | POA: Diagnosis not present

## 2024-08-19 DIAGNOSIS — M545 Low back pain, unspecified: Secondary | ICD-10-CM | POA: Insufficient documentation

## 2024-08-19 DIAGNOSIS — G894 Chronic pain syndrome: Secondary | ICD-10-CM | POA: Insufficient documentation

## 2024-08-19 DIAGNOSIS — G8929 Other chronic pain: Secondary | ICD-10-CM | POA: Insufficient documentation

## 2024-08-19 DIAGNOSIS — M1712 Unilateral primary osteoarthritis, left knee: Secondary | ICD-10-CM | POA: Insufficient documentation

## 2024-08-19 MED ORDER — TRAMADOL HCL 50 MG PO TABS: 50.0000 mg | ORAL_TABLET | Freq: Four times a day (QID) | ORAL | 1 refills | Status: DC

## 2024-08-19 NOTE — Telephone Encounter (Signed)
*  STAT* If patient is at the pharmacy, call can be transferred to refill team.   1. Which medications need to be refilled? (please list name of each medication and dose if known) amLODipine  (NORVASC ) 5 MG tablet   metoprolol  succinate (TOPROL -XL) 25 MG 24 hr tablet   2. Which pharmacy/location (including street and city if local pharmacy) is medication to be sent to? [id number] [address] [place], [place] - [address]   3. Do they need a 30 day or 90 day supply? 90

## 2024-08-19 NOTE — Progress Notes (Signed)
 Subjective:    Patient ID: Ryan Craig, male    DOB: [birth year], 78 y.o.   MRN: [id number]  HPI: Ryan Craig is a 78 y.o. male who returns for follow up appointment for chronic pain and medication refill. He states his pain is located in his lower back and bilateral knee pain. He rates his pain 8. His current exercise regime is walking and performing stretching exercises.  Mr. Chatmon Morphine  equivalent is 45.00 MME.   Oral Swab was Performed today.      Pain Inventory Average Pain 8 Pain Right Now 8 My pain is intermittent, sharp, dull, and aching  In the last 24 hours, has pain interfered with the following? General activity 9 Relation with others 8 Enjoyment of life 10 What TIME of day is your pain at its worst? night Sleep (in general) Fair  Pain is worse with: walking and standing Pain improves with: medication Relief from Meds: 8  Family History  Problem Relation Age of Onset   Heart attack Mother    Cancer Maternal Aunt    Cancer Paternal Aunt    Cancer Cousin    Social History   Socioeconomic History   Marital status: Married    Spouse name: Not on file   Number of children: Not on file   Years of education: Not on file   Highest education level: Not on file  Occupational History   Occupation: retired  Tobacco Use   Smoking status: Former    Passive exposure: Past   Smokeless tobacco: Former   Tobacco comments:    quit smoking about 6-7ytrs ago  Advertising Account Planner   Vaping status: Never Used  Substance and Sexual Activity   Alcohol use: No    Comment: drank over 40 years ago   Drug use: Not Currently    Types: Hydrocodone    Sexual activity: Not Currently  Other Topics Concern   Not on file  Social History Narrative   Not on file   Social Drivers of Health   Financial Resource Strain: Low Risk  (12/26/2023)   Overall Financial Resource Strain (CARDIA)    Difficulty of Paying Living Expenses: Not hard at all  Food Insecurity: No Food Insecurity  (12/26/2023)   Hunger Vital Sign    Worried About Running Out of Food in the Last Year: Never true    Ran Out of Food in the Last Year: Never true  Transportation Needs: No Transportation Needs (12/26/2023)   PRAPARE - Administrator, Civil Service (Medical): No    Lack of Transportation (Non-Medical): No  Physical Activity: Inactive (12/26/2023)   Exercise Vital Sign    Days of Exercise per Week: 0 days    Minutes of Exercise per Session: 0 min  Stress: No Stress Concern Present (12/26/2023)   [employer] of Occupational Health - Occupational Stress Questionnaire    Feeling of Stress : Not at all  Social Connections: Moderately Integrated (12/26/2023)   Social Connection and Isolation Panel    Frequency of Communication with Friends and Family: More than three times a week    Frequency of Social Gatherings with Friends and Family: Once a week    Attends Religious Services: More than 4 times per year    Active Member of [employer] or Organizations: No    Attends Banker Meetings: Never    Marital Status: Married   Past Surgical History:  Procedure Laterality Date   arthroscopic knee surgery Right    COLONOSCOPY  INGUINAL HERNIA REPAIR Bilateral    x 4   JOINT REPLACEMENT Right 6 yrs ago   knee   KNEE ARTHROSCOPY Left 02/24/2014   Procedure: LEFT KNEE ARTHROSCOPY WITH PARTIAL MEDIAL MENISCECTOMY;  Surgeon: Lonni CINDERELLA Poli, MD;  Location: [facility] OR;  Service: Orthopedics;  Laterality: Left;   LEFT HEART CATH AND CORONARY ANGIOGRAPHY N/A 07/22/2020   Procedure: LEFT HEART CATH AND CORONARY ANGIOGRAPHY;  Surgeon: Verlin Lonni BIRCH, MD;  Location: [facility];  Service: Cardiovascular;  Laterality: N/A;   TOTAL KNEE ARTHROPLASTY Left 12/22/2014   Procedure: LEFT TOTAL KNEE ARTHROPLASTY;  Surgeon: Lonni CINDERELLA Poli, MD;  Location: [facility] OR;  Service: Orthopedics;  Laterality: Left;   Past Surgical History:  Procedure Laterality Date   arthroscopic  knee surgery Right    COLONOSCOPY     INGUINAL HERNIA REPAIR Bilateral    x 4   JOINT REPLACEMENT Right 6 yrs ago   knee   KNEE ARTHROSCOPY Left 02/24/2014   Procedure: LEFT KNEE ARTHROSCOPY WITH PARTIAL MEDIAL MENISCECTOMY;  Surgeon: Lonni CINDERELLA Poli, MD;  Location: [facility] OR;  Service: Orthopedics;  Laterality: Left;   LEFT HEART CATH AND CORONARY ANGIOGRAPHY N/A 07/22/2020   Procedure: LEFT HEART CATH AND CORONARY ANGIOGRAPHY;  Surgeon: Verlin Lonni BIRCH, MD;  Location: [facility];  Service: Cardiovascular;  Laterality: N/A;   TOTAL KNEE ARTHROPLASTY Left 12/22/2014   Procedure: LEFT TOTAL KNEE ARTHROPLASTY;  Surgeon: Lonni CINDERELLA Poli, MD;  Location: [facility] OR;  Service: Orthopedics;  Laterality: Left;   Past Medical History:  Diagnosis Date   Anemia    no on any meds   Arthritis    Bifascicular block    Chronic back pain    deteriorating   Hyperlipidemia    Hypertension    Joint pain    Joint swelling    Mild CAD    Myocarditis (HCC)    PAT (paroxysmal atrial tachycardia)    Premature atrial contractions    PVC's (premature ventricular contractions)    Thoracic aortic aneurysm (TAA)    There were no vitals taken for this visit.  Opioid Risk Score:   Fall Risk Score:  `1  Depression screen [facility] 2/9     04/02/2024    9:01 AM 02/04/2024    9:01 AM 12/26/2023    9:08 AM 12/11/2023    8:49 AM 10/08/2023    9:01 AM 08/28/2023    8:53 AM 08/20/2023   10:59 AM  Depression screen PHQ 2/9  Decreased Interest 0 0 0 0 0 1 3  Down, Depressed, Hopeless 0 0 0 0 0 1 3  PHQ - 2 Score 0 0 0 0 0 2 6  Altered sleeping 0  0   0 3  Tired, decreased energy 0  0   1 2  Change in appetite 0  0   3 3  Feeling bad or failure about yourself  0  0   1 3  Trouble concentrating 0  0   0 2  Moving slowly or fidgety/restless 0  0   0 0  Suicidal thoughts 0  0   0 0  PHQ-9 Score 0  0   7 19  Difficult doing work/chores   Not difficult at all   Not difficult at all     Review of  Systems  Musculoskeletal:  Positive for back pain and gait problem.       Pain in both knees  All other systems reviewed  and are negative.      Objective:   Physical Exam Vitals and nursing note reviewed.  Constitutional:      Appearance: Normal appearance.  Cardiovascular:     Rate and Rhythm: Normal rate and regular rhythm.     Pulses: Normal pulses.     Heart sounds: Normal heart sounds.  Pulmonary:     Effort: Pulmonary effort is normal.     Breath sounds: Normal breath sounds.  Musculoskeletal:     Comments: Normal Muscle Bulk and Muscle Testing Reveals:  Upper Extremities: Full ROM and Muscle Strength 5/5 Lumbar Paraspinal Tenderness: L-4-L-5 Lower Extremities: Full ROM and Muscle Strength 5/5 Bilateral Lower Extremities Flexion Produces Pain into his Bilateral Patella's Arises from Table slowly using cane for support Antalgic Gait     Skin:    General: Skin is warm and dry.  Neurological:     Mental Status: He is alert and oriented to person, place, and time.          Assessment & Plan:  Chronic Bilateral low back pain with left sided sciatica: Continue HEP as tolerated. Continue to Monitor. 08/19/2024 Impaired Gait Mobility: Continue to use assistive device with his cane. 08/19/2024 Chronic Pain Syndrome: Continue : Hydrocodone  5/325 mg daily, using it sparingly, and  Refilled: Tramadol  50 mg one tablet 4 times  a day as needed for pain.#120. May take two tablets twice a day, do not exceed 4 tablets daily.  We will continue the opioid monitoring program, this consists of regular clinic visits, examinations, urine drug screen, pill counts as well as use of [place]  Controlled Substance Reporting system. A 12 month History has been reviewed on the [place]  Controlled Substance Reporting System on 08/19/2024 Bilateral  Knee Pain: Continue HEP as Tolerated. Continue to Monitor. 08/19/2024 5.  Fall at Home subsequent encounter: No Falls this month Continue  HEP as tolerated. Continue to Monitor. 08/19/2024   F/U in 2 months

## 2024-08-19 NOTE — Telephone Encounter (Signed)
 Pt's medications were sent to pt's pharmacy on 08/05/24 with a year supply. Confirmation received.

## 2024-08-22 LAB — DRUG TOX MONITOR 1 W/CONF, ORAL FLD

## 2024-08-22 LAB — DRUG TOX ALC METAB W/CON, ORAL FLD: Alcohol Metabolite: NEGATIVE ng/mL (ref <25)

## 2024-09-03 ENCOUNTER — Encounter (HOSPITAL_COMMUNITY): Payer: Self-pay | Admitting: Emergency Medicine

## 2024-09-03 ENCOUNTER — Ambulatory Visit (HOSPITAL_COMMUNITY)
Admission: EM | Admit: 2024-09-03 | Discharge: 2024-09-03 | Disposition: A | Discharge status: Home / Self Care | Attending: Internal Medicine | Admitting: Internal Medicine

## 2024-09-03 ENCOUNTER — Ambulatory Visit (INDEPENDENT_AMBULATORY_CARE_PROVIDER_SITE_OTHER)

## 2024-09-03 ENCOUNTER — Other Ambulatory Visit: Payer: Self-pay

## 2024-09-03 DIAGNOSIS — M19041 Primary osteoarthritis, right hand: Secondary | ICD-10-CM | POA: Diagnosis not present

## 2024-09-03 DIAGNOSIS — S6991XA Unspecified injury of right wrist, hand and finger(s), initial encounter: Secondary | ICD-10-CM | POA: Diagnosis not present

## 2024-09-03 DIAGNOSIS — W108XXA Fall (on) (from) other stairs and steps, initial encounter: Secondary | ICD-10-CM | POA: Diagnosis not present

## 2024-09-03 DIAGNOSIS — Z043 Encounter for examination and observation following other accident: Secondary | ICD-10-CM | POA: Diagnosis not present

## 2024-09-03 NOTE — Discharge Instructions (Signed)
 Your x-rays are negative for fracture, however I am suspicious that you may have broken your scaphoid bone in your right hand/wrist. We have placed your right hand/wrist in a thumb spica splint.  Do not get the splint wet and do not remove the splint. Wear the splint until your follow-up appointment with the hand specialist (phone number is listed on your paperwork to call to make this appointment).  Rest, ice, elevate, and compress the injury to reduce swelling and inflammation.   Schedule an appointment with the orthopedic provider listed on your paperwork for follow-up in the next 3-5 days.  Return if you experience worsening pain, numbness, tingling, skin color changes, or any other concerning symptoms. If you experience vision changes, nausea/vomiting, severe headache, dizziness, or one-sided weakness of your body, please go to the ER as this could be a sign of a head injury.  If symptoms are severe, please go to the ER. I hope you feel better!!

## 2024-09-03 NOTE — ED Notes (Signed)
Called ortho.  

## 2024-09-03 NOTE — ED Notes (Signed)
 Provided ice pack for hand

## 2024-09-03 NOTE — Progress Notes (Signed)
 Orthopedic Tech Progress Note Patient Details:  Ryan Craig Aug 31, 1946 [id number]  Ortho Devices Type of Ortho Device: Thumb spica splint Splint Material: Fiberglass Ortho Device/Splint Location: RUE Ortho Device/Splint Interventions: Ordered, Application, Adjustment   Post Interventions Patient Tolerated: Fair Instructions Provided: Adjustment of device, Care of device   Vibhav Waddill F Que Meneely 09/03/2024, 12:04 PM

## 2024-09-03 NOTE — ED Provider Notes (Signed)
 [facility]    CSN: [id number] Arrival date & time: 09/03/24  9148      History   Chief Complaint Chief Complaint  Patient presents with   Fall    HPI Ryan Craig is a 78 y.o. male.   Ryan Craig is a 78 y.o. male presenting with wife who contributes to the history for chief complaint of Fall that happened 48 hours ago on Monday November 10th, 2025. Patient had just finished blowing leaves in his yard and was walking up his front steps when he tripped over his foot and fell forward landing on both of his knees and onto an outstretched right hand.  He states the top of his head hit the wall as result of fall but most of the impact from the ball was onto his right hand/wrist. He was able to ring the doorbell and alert his wife of his fall. He stood up and was ambulatory immediately after falling. He did not become nauseous or vomit after falling and denies headache, vision changes, unilateral extremity weakness, epistaxis, changes in gait from baseline, dizziness, and decreased appetite after hitting head/fall. Takes aspirin  daily, denies blood thinner use. No amnesia after event or memory changes/increased confusion in the last 2 days since fall per wife.  His right hand/thumb pain has worsened over the last 24-48 hours. Right hand has become increasingly swollen and painful over the right thumb and snuffbox region. Denies previous injuries to the right thumb, numbness/tingling to distal right hand, and open wounds to the right hand. He takes hydrocodone  and tramadol  for chronic low back pain, taking this helps with right hand pain slightly. Pain is worse at nighttime and is waking him up out of his sleep.    Fall    Past Medical History:  Diagnosis Date   Anemia    no on any meds   Arthritis    Bifascicular block    Chronic back pain    deteriorating   Hyperlipidemia    Hypertension    Joint pain    Joint swelling    Mild CAD    Myocarditis (HCC)    PAT  (paroxysmal atrial tachycardia)    Premature atrial contractions    PVC's (premature ventricular contractions)    Thoracic aortic aneurysm (TAA)     Patient Active Problem List   Diagnosis Date Noted   COVID-19 vaccination declined 07/08/2024   Influenza vaccination declined 07/08/2024   Aortic atherosclerosis 01/22/2024   Impaired gait and mobility 09/12/2023   Abnormal glucose 08/28/2023   Need for influenza vaccination 08/28/2023   Family history of bone cancer 08/28/2023   Chronic pain syndrome 08/20/2023   Chronic bilateral low back pain with left-sided sciatica 08/20/2023   Encounter for long-term use of opiate analgesic 08/20/2023   Trochanteric bursitis of right hip 08/20/2023   Herpes zoster vaccination declined 05/30/2023   Tetanus, diphtheria, and acellular pertussis (Tdap) vaccination declined 05/30/2023   Weight loss, abnormal 05/30/2023   Coronary artery disease 04/18/2023   Thoracic aortic aneurysm without rupture, unspecified part 12/27/2022   Hemorrhage of rectum and anus 11/27/2022   Class 1 obesity due to excess calories with serious comorbidity and body mass index (BMI) of 31.0 to 31.9 in adult 03/27/2022   HLD (hyperlipidemia) 07/23/2020   Chest pain 07/22/2020   Chest pain of uncertain etiology    Hypertensive heart disease 01/22/2019   Persistent cough 01/22/2019   Right bundle branch block 11/21/2018   Left anterior fascicular block 11/21/2018  Former smoker 11/21/2018   Obesity (BMI 30-39.9) 11/21/2018   Osteoarthritis of left knee 12/22/2014   Status post total left knee replacement 12/22/2014   Acute medial meniscus tear of left knee 02/24/2014    Past Surgical History:  Procedure Laterality Date   arthroscopic knee surgery Right    COLONOSCOPY     INGUINAL HERNIA REPAIR Bilateral    x 4   JOINT REPLACEMENT Right 6 yrs ago   knee   KNEE ARTHROSCOPY Left 02/24/2014   Procedure: LEFT KNEE ARTHROSCOPY WITH PARTIAL MEDIAL MENISCECTOMY;  Surgeon:  Lonni CINDERELLA Poli, MD;  Location: [facility] OR;  Service: Orthopedics;  Laterality: Left;   LEFT HEART CATH AND CORONARY ANGIOGRAPHY N/A 07/22/2020   Procedure: LEFT HEART CATH AND CORONARY ANGIOGRAPHY;  Surgeon: Verlin Lonni BIRCH, MD;  Location: [facility];  Service: Cardiovascular;  Laterality: N/A;   TOTAL KNEE ARTHROPLASTY Left 12/22/2014   Procedure: LEFT TOTAL KNEE ARTHROPLASTY;  Surgeon: Lonni CINDERELLA Poli, MD;  Location: [facility] OR;  Service: Orthopedics;  Laterality: Left;       Home Medications    Prior to Admission medications   Medication Sig Start Date End Date Taking? Authorizing Provider  HYDROcodone -acetaminophen  (NORCO) 5-325 MG tablet Take 1 tablet by mouth daily as needed for severe pain (pain score 7-10). Use as needed once daily for breakthrough pain; you are only receiving 15 tablets for the month and are responsible for budgeting them; early refills of this medication will not be permitted. Do Not Fill Before 07/15/2024 06/17/24  Yes Debby Fidela CROME, NP  acetaminophen  (TYLENOL ) 500 MG tablet Take 500 mg by mouth every 6 (six) hours as needed.    [provider]  amLODipine  (NORVASC ) 5 MG tablet Take 1 tablet (5 mg total) by mouth daily. 08/05/24   Parthenia Olivia HERO, PA-C  aspirin  EC 81 MG tablet Take 81 mg by mouth daily.    [provider]  BLACK CURRANT SEED OIL PO Take by mouth.    [provider]  Cholecalciferol (VITAMIN D -3 PO) Take 2 tablets by mouth daily.     [provider]  lisinopril  (ZESTRIL ) 10 MG tablet TAKE 1 TABLET BY MOUTH EVERY DAY 02/15/24   Moore, Janece, FNP  metoprolol  succinate (TOPROL -XL) 25 MG 24 hr tablet Take 1 tablet (25 mg total) by mouth daily. 08/05/24   Parthenia Olivia HERO, PA-C  rosuvastatin  (CRESTOR ) 40 MG tablet TAKE 1 TABLET BY MOUTH EVERY DAY 07/24/24   Moore, Janece, FNP  traMADol  (ULTRAM ) 50 MG tablet Take 1 tablet (50 mg total) by mouth 4 (four) times daily. You may take up to 2 tablets at  once, but do not exceed 4 tablets daily 08/19/24   Debby Fidela CROME, NP  triamcinolone  cream (KENALOG ) 0.1 % APPLY TOPICALLY A THIN LAYER TO THE AFFECTED AREA(S) TWICE A DAY 06/02/24   Georgina Speaks, FNP  vitamin B-12 (CYANOCOBALAMIN ) 100 MCG tablet TAKE 1 TABLET BY MOUTH EVERY DAY 02/12/24   Georgina Speaks, FNP    Family History Family History  Problem Relation Age of Onset   Heart attack Mother    Cancer Maternal Aunt    Cancer Paternal Aunt    Cancer Cousin     Social History Social History   Tobacco Use   Smoking status: Former    Passive exposure: Past   Smokeless tobacco: Former   Tobacco comments:    quit smoking about 6-7ytrs ago  Vaping Use   Vaping status: Never Used  Substance  Use Topics   Alcohol use: No    Comment: drank over 40 years ago   Drug use: Not Currently    Types: Hydrocodone      Allergies   Contrast media [iodinated contrast media] and Latex   Review of Systems Review of Systems Per HPI  Physical Exam Triage Vital Signs ED Triage Vitals  Encounter Vitals Group     BP 09/03/24 1016 138/73     Girls Systolic BP Percentile --      Girls Diastolic BP Percentile --      Boys Systolic BP Percentile --      Boys Diastolic BP Percentile --      Pulse Rate 09/03/24 1016 73     Resp 09/03/24 1016 20     Temp 09/03/24 1016 97.9 F (36.6 C)     Temp Source 09/03/24 1016 Oral     SpO2 09/03/24 1016 94 %     Weight --      Height --      Head Circumference --      Peak Flow --      Pain Score 09/03/24 1013 10     Pain Loc --      Pain Education --      Exclude from Growth Chart --    No data found.  Updated Vital Signs BP 138/73 (BP Location: Left Arm)   Pulse 73   Temp 97.9 F (36.6 C) (Oral)   Resp 20   SpO2 94%   Visual Acuity Right Eye Distance:   Left Eye Distance:   Bilateral Distance:    Right Eye Near:   Left Eye Near:    Bilateral Near:     Physical Exam Vitals and nursing note reviewed.  Constitutional:       Appearance: He is not ill-appearing or toxic-appearing.  HENT:     Head: Normocephalic and atraumatic.     Right Ear: Hearing, tympanic membrane, ear canal and external ear normal.     Left Ear: Hearing, tympanic membrane, ear canal and external ear normal.     Nose: Nose normal.     Mouth/Throat:     Lips: Pink.     Mouth: Mucous membranes are moist. No injury or oral lesions.     Dentition: Normal dentition.     Tongue: No lesions.     Pharynx: Oropharynx is clear. Uvula midline. No pharyngeal swelling, oropharyngeal exudate, posterior oropharyngeal erythema, uvula swelling or postnasal drip.     Tonsils: No tonsillar exudate.  Eyes:     General: Lids are normal. Vision grossly intact. Gaze aligned appropriately.     Extraocular Movements: Extraocular movements intact.     Conjunctiva/sclera: Conjunctivae normal.  Neck:     Trachea: Trachea and phonation normal.  Cardiovascular:     Rate and Rhythm: Normal rate and regular rhythm.     Heart sounds: Normal heart sounds, S1 normal and S2 normal.  Pulmonary:     Effort: Pulmonary effort is normal. No respiratory distress.     Breath sounds: Normal breath sounds and air entry.  Musculoskeletal:     Right wrist: Swelling, tenderness, bony tenderness and snuff box tenderness present. No deformity, effusion, lacerations or crepitus. Decreased range of motion. Normal pulse.     Right hand: Swelling present. No deformity, lacerations, tenderness or bony tenderness. Normal range of motion. Normal sensation. There is no disruption of two-point discrimination. Normal capillary refill. Normal pulse.     Cervical back: Neck supple.  Comments: Able to make loose fist with right hand secondary to pain and swelling of the right thumb.  Moves all 5 fingers of right hand voluntarily.  5/5 strength against resistance with flexion and extension of all 5 fingers of right hand.  Sensation intact to distal right hand.  +2 right radial pulse.  Into cap  refill.  Significantly tender with light palpation over the right snuffbox.  Lymphadenopathy:     Cervical: No cervical adenopathy.  Skin:    General: Skin is warm and dry.     Capillary Refill: Capillary refill takes less than 2 seconds.     Findings: No rash.  Neurological:     General: No focal deficit present.     Mental Status: He is alert and oriented to person, place, and time. Mental status is at baseline.     GCS: GCS eye subscore is 4. GCS verbal subscore is 5. GCS motor subscore is 6.     Cranial Nerves: Cranial nerves 2-12 are intact. No dysarthria or facial asymmetry.     Sensory: Sensation is intact.     Motor: Motor function is intact. No weakness, tremor, abnormal muscle tone or pronator drift.     Coordination: Coordination is intact. Romberg sign negative. Coordination normal. Finger-Nose-Finger Test normal.     Gait: Gait is intact.     Comments: Strength and sensation intact to bilateral upper and lower extremities (5/5). Moves all 4 extremities with normal coordination voluntarily. Non-focal neuro exam.   Psychiatric:        Mood and Affect: Mood normal.        Speech: Speech normal.        Behavior: Behavior normal.        Thought Content: Thought content normal.        Judgment: Judgment normal.      UC Treatments / Results  Labs (all labs ordered are listed, but only abnormal results are displayed) Labs Reviewed - No data to display  EKG   Radiology DG Hand Complete Right Result Date: 09/03/2024 CLINICAL DATA:  Thumb and index finger pain after a fall. EXAM: DG HAND COMPLETE 3+V*R* COMPARISON:  None Available. FINDINGS: No evidence for an acute fracture. No subluxation or dislocation. Degenerative changes are seen at the first MCP joint. Degenerative changes also noted in scattered IP joints of the hand. No worrisome lytic or sclerotic osseous abnormality. IMPRESSION: Degenerative changes without acute bony findings. Electronically Signed   By: Camellia Candle M.D.   On: 09/03/2024 10:49    Procedures Procedures (including critical care time)  Medications Ordered in UC Medications - No data to display  Initial Impression / Assessment and Plan / UC Course  I have reviewed the triage vital signs and the nursing notes.  Pertinent labs & imaging results that were available during my care of the patient were reviewed by me and considered in my medical decision making (see chart for details).   1.  Injury of right hand, fall on steps Right hand x-rays are unremarkable for acute bony abnormality and showed degenerative changes.  Regardless of negative images, there is a high clinical suspicion for scaphoid injury/acute fracture, therefore we will treat this with thumb spica splint and hand specialist follow-up in 3 to 5 days for recheck. RICE advised. He is neurovascularly intact to baseline without focal deficit on neuroexam.   Cranium is atraumatic and nontender to palpation without palpable or visible hematoma.  No Battle sign or raccoon eyes.  No red flags on exam indicating need for CT imaging of the head or neck as a result of fall.  We have discussed ER return precautions should he develop new neurosymptoms indicating need for CT scan to rule out venous bleed.   Continue taking hydrocodone -acetaminophen  and tramadol  as prescribed by pain clinic to treat low back pain and new wrist injury.  Ice in clinic has significantly improved pain and swelling.   Counseled patient on potential for adverse effects with medications prescribed/recommended today, strict ER and return-to-clinic precautions discussed, patient verbalized understanding.    Final Clinical Impressions(s) / UC Diagnoses   Final diagnoses:  Injury of right hand, initial encounter  Fall on steps, initial encounter     Discharge Instructions      Your x-rays are negative for fracture, however I am suspicious that you may have broken your scaphoid bone in your right  hand/wrist. We have placed your right hand/wrist in a thumb spica splint.  Do not get the splint wet and do not remove the splint. Wear the splint until your follow-up appointment with the hand specialist (phone number is listed on your paperwork to call to make this appointment).  Rest, ice, elevate, and compress the injury to reduce swelling and inflammation.   Schedule an appointment with the orthopedic provider listed on your paperwork for follow-up in the next 3-5 days.  Return if you experience worsening pain, numbness, tingling, skin color changes, or any other concerning symptoms. If you experience vision changes, nausea/vomiting, severe headache, dizziness, or one-sided weakness of your body, please go to the ER as this could be a sign of a head injury.  If symptoms are severe, please go to the ER. I hope you feel better!!       ED Prescriptions   None    PDMP not reviewed this encounter.   Enedelia Dorna HERO, FNP 09/03/24 1146

## 2024-09-03 NOTE — ED Triage Notes (Addendum)
 Fell Monday.  Questions if he tripped on steps.  Patient fell to his knees, landed on right hand and reports head hit the wall.  Patient did not have any loc.  Patient is here for right hand.  Pain has kept patient awake.  Right hand is swollen, pain in right thumb and hand at base of thumb and index finger  Able to move remaining fingers, but not thumb or index finger  Patient has tramadol  and hydrocodone  and took tramadol  at 3 am

## 2024-09-03 NOTE — ED Notes (Signed)
 Ortho team have finished with patient

## 2024-09-05 DIAGNOSIS — M25531 Pain in right wrist: Secondary | ICD-10-CM | POA: Diagnosis not present

## 2024-09-24 DIAGNOSIS — R630 Anorexia: Secondary | ICD-10-CM | POA: Diagnosis not present

## 2024-09-24 DIAGNOSIS — R634 Abnormal weight loss: Secondary | ICD-10-CM | POA: Diagnosis not present

## 2024-09-24 DIAGNOSIS — R1084 Generalized abdominal pain: Secondary | ICD-10-CM | POA: Diagnosis not present

## 2024-09-26 ENCOUNTER — Telehealth: Payer: Self-pay | Admitting: *Deleted

## 2024-09-26 DIAGNOSIS — G8929 Other chronic pain: Secondary | ICD-10-CM

## 2024-09-26 DIAGNOSIS — M1711 Unilateral primary osteoarthritis, right knee: Secondary | ICD-10-CM

## 2024-09-26 DIAGNOSIS — M1712 Unilateral primary osteoarthritis, left knee: Secondary | ICD-10-CM

## 2024-09-26 DIAGNOSIS — G894 Chronic pain syndrome: Secondary | ICD-10-CM

## 2024-09-26 MED ORDER — HYDROCODONE-ACETAMINOPHEN 5-325 MG PO TABS: 1.0000 | ORAL_TABLET | Freq: Every day | ORAL | 0 refills | Status: DC | PRN

## 2024-09-26 MED ORDER — TRAMADOL HCL 50 MG PO TABS: 50.0000 mg | ORAL_TABLET | Freq: Four times a day (QID) | ORAL | 0 refills | Status: DC

## 2024-09-26 NOTE — Telephone Encounter (Signed)
 Ryan Craig called for a refill on his tramadol  and hydrocodone . He has appt with Fidela 10/21/24.

## 2024-09-26 NOTE — Telephone Encounter (Signed)
 Refilled each 1 month to [facility] in [place]

## 2024-09-29 DIAGNOSIS — M25531 Pain in right wrist: Secondary | ICD-10-CM | POA: Diagnosis not present

## 2024-10-18 ENCOUNTER — Ambulatory Visit (HOSPITAL_COMMUNITY): Admission: EM | Admit: 2024-10-18 | Discharge: 2024-10-18 | Disposition: A | Discharge status: Home / Self Care

## 2024-10-18 ENCOUNTER — Encounter (HOSPITAL_COMMUNITY): Payer: Self-pay | Admitting: Emergency Medicine

## 2024-10-18 ENCOUNTER — Emergency Department (HOSPITAL_COMMUNITY)

## 2024-10-18 ENCOUNTER — Encounter (HOSPITAL_COMMUNITY): Payer: Self-pay

## 2024-10-18 ENCOUNTER — Emergency Department (HOSPITAL_COMMUNITY)
Admission: EM | Admit: 2024-10-18 | Discharge: 2024-10-19 | Disposition: A | Discharge status: Home / Self Care | Attending: Emergency Medicine | Admitting: Emergency Medicine

## 2024-10-18 DIAGNOSIS — Y92007 Garden or yard of unspecified non-institutional (private) residence as the place of occurrence of the external cause: Secondary | ICD-10-CM | POA: Diagnosis not present

## 2024-10-18 DIAGNOSIS — Z91040 Latex allergy status: Secondary | ICD-10-CM | POA: Insufficient documentation

## 2024-10-18 DIAGNOSIS — S52501A Unspecified fracture of the lower end of right radius, initial encounter for closed fracture: Secondary | ICD-10-CM | POA: Diagnosis not present

## 2024-10-18 DIAGNOSIS — S62101A Fracture of unspecified carpal bone, right wrist, initial encounter for closed fracture: Secondary | ICD-10-CM

## 2024-10-18 DIAGNOSIS — S52611A Displaced fracture of right ulna styloid process, initial encounter for closed fracture: Secondary | ICD-10-CM | POA: Insufficient documentation

## 2024-10-18 DIAGNOSIS — S0990XA Unspecified injury of head, initial encounter: Secondary | ICD-10-CM

## 2024-10-18 DIAGNOSIS — Z7982 Long term (current) use of aspirin: Secondary | ICD-10-CM | POA: Diagnosis not present

## 2024-10-18 DIAGNOSIS — W19XXXA Unspecified fall, initial encounter: Secondary | ICD-10-CM

## 2024-10-18 DIAGNOSIS — W01198A Fall on same level from slipping, tripping and stumbling with subsequent striking against other object, initial encounter: Secondary | ICD-10-CM | POA: Insufficient documentation

## 2024-10-18 DIAGNOSIS — Z79899 Other long term (current) drug therapy: Secondary | ICD-10-CM | POA: Insufficient documentation

## 2024-10-18 DIAGNOSIS — Y9389 Activity, other specified: Secondary | ICD-10-CM | POA: Insufficient documentation

## 2024-10-18 DIAGNOSIS — I10 Essential (primary) hypertension: Secondary | ICD-10-CM | POA: Insufficient documentation

## 2024-10-18 DIAGNOSIS — M7989 Other specified soft tissue disorders: Secondary | ICD-10-CM | POA: Diagnosis not present

## 2024-10-18 DIAGNOSIS — S0083XA Contusion of other part of head, initial encounter: Secondary | ICD-10-CM | POA: Insufficient documentation

## 2024-10-18 DIAGNOSIS — S6991XA Unspecified injury of right wrist, hand and finger(s), initial encounter: Secondary | ICD-10-CM | POA: Diagnosis present

## 2024-10-18 DIAGNOSIS — T148XXA Other injury of unspecified body region, initial encounter: Secondary | ICD-10-CM

## 2024-10-18 NOTE — ED Triage Notes (Signed)
 PT was tripped and feel while working on Christmas lights in yard.   Fell onto [employer].  No loc.  Pt not on blood thinners.  Abrasions noted to R forehead.  Pt c/o pain to R forehead and R arm.  Denies dizziness or nausea.

## 2024-10-18 NOTE — ED Provider Triage Note (Signed)
 Emergency Medicine Provider Triage Evaluation Note  Ryan Craig , a 78 y.o. male  was evaluated in triage.  Pt complains of fall.  Patient was walking outside when he tripped over some bricks and fell with the majority of the impact occurring to his right forehead and right upper extremity.  Complaining of right sided headache as well as right wrist pain and right shoulder pain.  Denies use of blood thinners, denies loss of consciousness.  Review of Systems  Positive: As above Negative: As above  Physical Exam  BP (!) 103/58 (BP Location: Right Arm)   Pulse 72   Temp (!) 97.5 F (36.4 C) (Oral)   Resp 16   SpO2 95%  Gen:   Awake, no distress   Resp:  Normal effort  MSK:   Moves extremities without difficulty  Other:  Hematoma to R forehead TTP of R wrist, limited ROM at wrist and shoulder secondary to pain  Medical Decision Making  Medically screening exam initiated at 8:27 PM.  Appropriate orders placed.  Ryan Craig was informed that the remainder of the evaluation will be completed by another provider, this initial triage assessment does not replace that evaluation, and the importance of remaining in the ED until their evaluation is complete.     Ryan Craig SAILOR, [place] 10/18/24 2029

## 2024-10-18 NOTE — ED Notes (Signed)
 Denies tenderness to neck upon palpation

## 2024-10-18 NOTE — ED Provider Notes (Signed)
 Patient seen briefly in triage.  This evening when he went out in the dark to see why his Christmas lights were not coming on, he tripped on the curb and fell forward onto concrete hitting his right frontal area.  He denies a loss of consciousness.  He sustained an abrasion and hematoma to the right frontal area.  He denies any blurry vision or double vision.  He takes aspirin  but no other blood thinners  On exam he is alert and oriented x 4 and overall well-appearing.  He actually has a soft blood pressure of 96/60.  He usually runs in the 130 systolic.  He denies any dizziness.  A neighbor is driving him and his wife.  We have asked him to proceed to the emergency room for further evaluation that we cannot provide here in a clinic setting.  They are agreeable and will go by private vehicle staff will wheel him to the car or transport to the emergency room.   Vonna Sharlet POUR, MD 10/18/24 [phone]

## 2024-10-18 NOTE — ED Notes (Signed)
 Patient is being discharged from the Urgent Care and sent to the Emergency Department via personal opperated vehicle with family . Per Dr Sharlet Linden, patient is in need of higher level of care due to fall wtih head injury. Patient is aware and verbalizes understanding of plan of care.  Vitals:   10/18/24 1930  BP: 96/60  Pulse: 78  Resp: 17  SpO2: 100%

## 2024-10-18 NOTE — ED Triage Notes (Signed)
 Patient was checking his light in the yard when he tripped and fell. Right side of the forehead hit into the pavement. Re-injured the right wrist that was in a brace already. Denies any LOC, vision, changes, or nausea.

## 2024-10-19 ENCOUNTER — Other Ambulatory Visit: Payer: Self-pay

## 2024-10-19 DIAGNOSIS — S52501A Unspecified fracture of the lower end of right radius, initial encounter for closed fracture: Secondary | ICD-10-CM | POA: Diagnosis not present

## 2024-10-19 NOTE — ED Notes (Signed)
 Ortho tech at bedside

## 2024-10-19 NOTE — Discharge Instructions (Addendum)
 History, exam, workup today revealed a right wrist fracture in the radius and the ulna.  I spoke to Dr. Reyne with orthopedic hand surgery with [facility] and they recommended follow-up with them in clinic and he should be available on Tuesday to see you.  Please keep in the splint and the sling to help with stabilization and please rest and stay hydrated.  Use his home pain medicine to help with discomfort if he needs it.  The CT scan of the head was overall reassuring with no skull fracture or intracranial bleeding.  Please follow-up with his PCP as well.  Please rest and stay hydrated.  If any symptoms change or worsen acutely, please return to the nearest emergency department.

## 2024-10-19 NOTE — Progress Notes (Signed)
 Orthopedic Tech Progress Note Patient Details:  Ryan Craig 06-Apr-1946 [id number]  Ortho Devices Type of Ortho Device: Sugartong splint, Arm sling Ortho Device/Splint Location: RUE Ortho Device/Splint Interventions: Application   Post Interventions Patient Tolerated: Well  Massie BRAVO Marian Grandt 10/19/2024, 10:47 AM

## 2024-10-19 NOTE — ED Provider Notes (Signed)
 " [facility] Provider Note   CSN: [id number] Arrival date & time: 10/18/24  1941     Patient presents with: Ryan Craig is a 78 y.o. male.   The history is provided by the patient, medical records and the spouse. No language interpreter was used.  Fall This is a new problem. The current episode started yesterday. The problem occurs rarely. The problem has not changed since onset.Associated symptoms include headaches. Pertinent negatives include no chest pain, no abdominal pain and no shortness of breath. Nothing aggravates the symptoms. Nothing relieves the symptoms. He has tried nothing for the symptoms. The treatment provided no relief.       Prior to Admission medications  Medication Sig Start Date End Date Taking? Authorizing Provider  acetaminophen  (TYLENOL ) 500 MG tablet Take 500 mg by mouth every 6 (six) hours as needed.    [provider]  amLODipine  (NORVASC ) 5 MG tablet Take 1 tablet (5 mg total) by mouth daily. 08/05/24   Parthenia Olivia HERO, PA-C  aspirin  EC 81 MG tablet Take 81 mg by mouth daily.    [provider]  BLACK CURRANT SEED OIL PO Take by mouth.    [provider]  Cholecalciferol (VITAMIN D -3 PO) Take 2 tablets by mouth daily.     [provider]  HYDROcodone -acetaminophen  (NORCO) 5-325 MG tablet Take 1 tablet by mouth daily as needed for severe pain (pain score 7-10). Use as needed once daily for breakthrough pain; you are only receiving 15 tablets for the month and are responsible for budgeting them; early refills of this medication will not be permitted. Do Not Fill Before 09/26/24 09/26/24   Emeline Search C, DO  lisinopril  (ZESTRIL ) 10 MG tablet TAKE 1 TABLET BY MOUTH EVERY DAY 02/15/24   Georgina Speaks, FNP  metoprolol  succinate (TOPROL -XL) 25 MG 24 hr tablet Take 1 tablet (25 mg total) by mouth daily. 08/05/24   Parthenia Olivia HERO, PA-C  rosuvastatin  (CRESTOR ) 40 MG tablet  TAKE 1 TABLET BY MOUTH EVERY DAY 07/24/24   Georgina Speaks, FNP  traMADol  (ULTRAM ) 50 MG tablet Take 1 tablet (50 mg total) by mouth 4 (four) times daily. You may take up to 2 tablets at once, but do not exceed 4 tablets daily 09/26/24 10/26/24  Engler, Morgan C, DO  triamcinolone  cream (KENALOG ) 0.1 % APPLY TOPICALLY A THIN LAYER TO THE AFFECTED AREA(S) TWICE A DAY 06/02/24   Georgina Speaks, FNP  vitamin B-12 (CYANOCOBALAMIN ) 100 MCG tablet TAKE 1 TABLET BY MOUTH EVERY DAY 02/12/24   Moore, Janece, FNP    Allergies: Contrast media [iodinated contrast media] and Latex    Review of Systems  Constitutional:  Negative for chills, fatigue and fever.  HENT:  Negative for congestion.   Respiratory:  Negative for cough, chest tightness and shortness of breath.   Cardiovascular:  Negative for chest pain, palpitations and leg swelling.  Gastrointestinal:  Negative for abdominal pain, constipation, diarrhea, nausea and vomiting.  Genitourinary:  Negative for dysuria and flank pain.  Musculoskeletal:  Negative for back pain, neck pain and neck stiffness.  Skin:  Positive for wound. Negative for rash.  Neurological:  Positive for headaches. Negative for weakness, light-headedness and numbness.  Psychiatric/Behavioral:  Negative for agitation and confusion.   All other systems reviewed and are negative.   Updated Vital Signs BP 111/72 (BP Location: Left Arm)   Pulse 74   Temp 97.9 F (36.6 C) (Oral)  Resp 18   Ht 5' 7 (1.702 m)   Wt 63.5 kg   SpO2 99%   BMI 21.93 kg/m   Physical Exam Vitals and nursing note reviewed.  Constitutional:      General: He is not in acute distress.    Appearance: He is well-developed. He is not ill-appearing, toxic-appearing or diaphoretic.  HENT:     Head: Contusion present.     Comments: Bruising and small hematoma on right forehead.  Abrasion.  No crepitance or fluctuance.  Pupils symmetric and reactive with normal extraocular movements.    Nose: No congestion or  rhinorrhea.     Mouth/Throat:     Mouth: Mucous membranes are moist.     Pharynx: No oropharyngeal exudate or posterior oropharyngeal erythema.  Eyes:     Extraocular Movements: Extraocular movements intact.     Conjunctiva/sclera: Conjunctivae normal.     Pupils: Pupils are equal, round, and reactive to light.  Cardiovascular:     Rate and Rhythm: Normal rate and regular rhythm.     Heart sounds: No murmur heard. Pulmonary:     Effort: Pulmonary effort is normal. No respiratory distress.     Breath sounds: Normal breath sounds. No wheezing, rhonchi or rales.  Chest:     Chest wall: No tenderness.  Abdominal:     General: Abdomen is flat.     Palpations: Abdomen is soft.     Tenderness: There is no abdominal tenderness. There is no guarding or rebound.  Musculoskeletal:        General: Swelling and tenderness present.     Cervical back: Neck supple. No tenderness.     Comments: Swelling and tenderness in right wrist but intact sensation strength and pulse.  No laceration.  Skin:    General: Skin is warm and dry.     Capillary Refill: Capillary refill takes less than 2 seconds.     Findings: Bruising present. No erythema.  Neurological:     General: No focal deficit present.     Mental Status: He is alert.     Sensory: No sensory deficit.     Motor: No weakness.  Psychiatric:        Mood and Affect: Mood normal.     (all labs ordered are listed, but only abnormal results are displayed) Labs Reviewed - No data to display  EKG: None  Radiology: CT Cervical Spine Wo Contrast Result Date: 10/18/2024 EXAM: CT CERVICAL SPINE WITHOUT CONTRAST 10/18/2024 09:41:59 PM TECHNIQUE: CT of the cervical spine was performed without the administration of intravenous contrast. Multiplanar reformatted images are provided for review. Automated exposure control, iterative reconstruction, and/or weight based adjustment of the mA/kV was utilized to reduce the radiation dose to as low as  reasonably achievable. COMPARISON: None available. CLINICAL HISTORY: Facial trauma, blunt. FINDINGS: BONES AND ALIGNMENT: Well-circumscribed gas lesion within the posterior C5 and posterior lateral C7 vertebral bodies suggestive of intraosseous pneumatocyst versus vertebral pneumatocyst versus subchondral cysts. No acute fracture or traumatic malalignment. DEGENERATIVE CHANGES: Mild-to-moderate degenerative changes of the spine. SOFT TISSUES: No prevertebral soft tissue swelling. LUNGS: Emphysematous changes of the lungs. IMPRESSION: 1. No acute findings. 2. Well-circumscribed gas lesion within the posterior C5 and posterior lateral C7 vertebral bodies suggestive of intraosseous pneumatocyst versus vertebral pneumatocyst versus subchondral cysts. 3. Emphysema. Electronically signed by: Morgane Naveau MD 10/18/2024 09:58 PM EST RP Workstation: [id number]   DG Wrist Complete Right Result Date: 10/18/2024 EXAM: 3 or more VIEW(S) XRAY OF THE WRIST 10/18/2024 09:08:00  PM COMPARISON: None available. CLINICAL HISTORY: fall, pain FINDINGS: BONES AND JOINTS: Acute, nondisplaced, transverse, possibly intra-articular, distal radial fracture. Acute minimally displaced ulnar styloid fracture. Degenerative changes of the first CMC. No malalignment. SOFT TISSUES: Soft tissue edema of the wrist. IMPRESSION: 1. Acute, nondisplaced, transverse, possibly intra-articular, distal radial fracture. 2. Acute minimally displaced ulnar styloid fracture. Electronically signed by: Morgane Naveau MD 10/18/2024 09:43 PM EST RP Workstation: [id number]   DG Elbow Complete Right Result Date: 10/18/2024 EXAM: 3 VIEW(S) XRAY OF THE ELBOW COMPARISON: None available. CLINICAL HISTORY: fall FINDINGS: BONES AND JOINTS: Well-corticated ossific density along the lateral epicondyle. SOFT TISSUES: The soft tissues are unremarkable. IMPRESSION: 1. No acute fracture or dislocation. Electronically signed by: Morgane Naveau MD 10/18/2024 09:42 PM EST RP  Workstation: [id number]   DG Forearm Right Result Date: 10/18/2024 EXAM: 1 VIEW XRAY OF THE RIGHT FOREARM 10/18/2024 09:08:00 PM COMPARISON: X-RAY RIGHT WRIST 10/18/2024. CLINICAL HISTORY: fall FINDINGS: BONES AND JOINTS: Acute, nondisplaced, likely intra-articular, distal radial fracture. Acute nondisplaced ulnar styloid fracture. No other acute fracture of the ulnar radius. Elbow grossly unremarkable. No malalignment. No dislocation. SOFT TISSUES: Soft tissue edema. IMPRESSION: 1. Acute, nondisplaced, likely intra-articular, distal radial fracture. 2. Acute nondisplaced ulnar styloid fracture. Electronically signed by: Morgane Naveau MD 10/18/2024 09:39 PM EST RP Workstation: [id number]   DG Shoulder Right Result Date: 10/18/2024 EXAM: 1 VIEW(S) XRAY OF THE _LATERALITY_ SHOULDER 10/18/2024 09:08:00 PM COMPARISON: None available. CLINICAL HISTORY: fall FINDINGS: BONES AND JOINTS: Glenohumeral joint is normally aligned. No acute fracture. No malalignment. Mild acromioclavicular and glenohumeral degenerative change. SOFT TISSUES: No abnormal calcifications. Visualized lung is unremarkable. IMPRESSION: 1. No evidence of acute traumatic injury. Electronically signed by: Morgane Naveau MD 10/18/2024 09:36 PM EST RP Workstation: [id number]     Procedures   Medications Ordered in the ED - No data to display                                  Medical Decision Making Amount and/or Complexity of Data Reviewed Radiology: ordered.    Oseph Imburgia is a 78 y.o. male with a past medical history significant for hypertension, hyperlipidemia, and osteoarthritis who presents with a fall.  According to patient family, they had recently gotten back into town from a trip when patient was out in his yard last night and tripped on a brick and then fell and hit his head on the ground.  He did not lose consciousness but got a bump on his right forehead and injured his right arm.  He is right-handed.  Of note, patient  has been in the emergency department for many hours prior to my initial evaluation.  Patient is denying any chest pain, shortness of breath, abdominal pain or new back pain.  He reports pain and swelling in his right wrist but denies other injuries.  He is denying any vision changes nausea or vomiting.  On exam, he has abrasion and bump to his right forehead but no deep laceration.  Pupils symmetric and reactive with normal extraocular movements.  Chest nontender.  Abdomen nontender.  Neck nontender, back nontender.  He has tenderness and swelling to his right wrist but no laceration.  He had intact sensation strength and pulse with his fingers.  Good cap refill.  No elbow tenderness.  He had some shoulder tenderness as well but no crepitance.  Patient had imaging overnight that revealed wrist fractures of the distal radius and  ulnar styloid.  No other fracture seen.  CT head and neck were reassuring with just some soft tissue injuries.  I took quite some time to get the CT reads but radiology was able to get them and they reported to me verbally that there was no concerning finding on there this time.  We discussed with family there was no abnormal looking cyst on the C-spine that PCP can follow-up with.  I called and spoke with orthopedics with Dr. Reyne with [facility] and they will see the patient on Tuesday for further management of this wrist fracture.  They recommended splinting and sling which we provided.  His forehead scrape was dressed and cleaned and family agrees to discharge.  They report he has pain medicine at home and does not need prescription.  Patient discharged in good condition for outpatient follow-up.              Final diagnoses:  Fall, initial encounter  Closed fracture of right wrist, initial encounter  Minor head injury, initial encounter  Abrasion    ED Discharge Orders     None      Clinical Impression: 1. Fall, initial encounter   2. Closed  fracture of right wrist, initial encounter   3. Minor head injury, initial encounter   4. Abrasion     Disposition: Discharge  Condition: Good  I have discussed the results, Dx and Tx plan with the pt(& family if present). He/she/they expressed understanding and agree(s) with the plan. Discharge instructions discussed at great length. Strict return precautions discussed and pt &/or family have verbalized understanding of the instructions. No further questions at time of discharge.    New Prescriptions   No medications on file    Follow Up: Reyne Cordella SQUIBB, MD [address] [place] [address] [phone]   Hand orthopedic surgeon with [facility].  Please call to try to get appointment for Tuesday when he is in clinic.      Saber Dickerman, Lonni PARAS, MD 10/19/24 1552  "

## 2024-10-20 ENCOUNTER — Ambulatory Visit: Payer: Self-pay

## 2024-10-20 NOTE — Telephone Encounter (Signed)
 On call [name] called returning our page. Shared CT results with provider.

## 2024-10-20 NOTE — Telephone Encounter (Signed)
" ° ° °  Copied from CRM [id number]. Topic: Clinical - Lab/Test Results >> Oct 20, 2024  5:09 PM Delon HERO wrote: Reason for CRM: [facility] is calling with critical STAT Lab. Reason for Disposition  Lab or radiology calling with CRITICAL test results  Answer Assessment - Initial Assessment Questions 1. REASON FOR CALL or QUESTION: What is your reason for calling today? or How can I best     CT head results: possible abnormal soft tissue along the right aspect of the sella vs tortuous course of internal carotid artery at this level.  Rec Non emergent mri of pituitary, sella with and without contrast for further evaluation.  2. CALLER: Document the source of call. (e.g., laboratory staff, caregiver or patient).     MJ from radiology  Protocols used: PCP Call - No Triage-A-AH  "

## 2024-10-21 ENCOUNTER — Encounter: Payer: Self-pay | Admitting: Registered Nurse

## 2024-10-21 ENCOUNTER — Telehealth: Payer: Self-pay

## 2024-10-21 ENCOUNTER — Encounter: Attending: Physical Medicine and Rehabilitation | Admitting: Registered Nurse

## 2024-10-21 DIAGNOSIS — G894 Chronic pain syndrome: Secondary | ICD-10-CM | POA: Insufficient documentation

## 2024-10-21 DIAGNOSIS — G8929 Other chronic pain: Secondary | ICD-10-CM | POA: Diagnosis present

## 2024-10-21 DIAGNOSIS — M545 Low back pain, unspecified: Secondary | ICD-10-CM | POA: Diagnosis present

## 2024-10-21 DIAGNOSIS — M1711 Unilateral primary osteoarthritis, right knee: Secondary | ICD-10-CM | POA: Diagnosis not present

## 2024-10-21 DIAGNOSIS — M1712 Unilateral primary osteoarthritis, left knee: Secondary | ICD-10-CM | POA: Diagnosis present

## 2024-10-21 MED ORDER — TRAMADOL HCL 50 MG PO TABS: 50.0000 mg | ORAL_TABLET | Freq: Four times a day (QID) | ORAL | 0 refills | Status: AC

## 2024-10-21 MED ORDER — HYDROCODONE-ACETAMINOPHEN 5-325 MG PO TABS: 1.0000 | ORAL_TABLET | Freq: Every day | ORAL | 0 refills | Status: AC | PRN

## 2024-10-21 NOTE — Transitions of Care (Post Inpatient/ED Visit) (Signed)
" ° °  10/21/2024  Name: Ryan Craig MRN: [id number] DOB: 28-Oct-1945  Today's TOC FU Call Status: Today's TOC FU Call Status:: Successful TOC FU Call Completed TOC FU Call Complete Date: 10/21/24  Patient's Name and Date of Birth confirmed. Name, DOB  Transition Care Management Follow-up Telephone Call Date of Discharge: 10/18/24 [facility]charge Facility: [facility]) Type of Discharge: Emergency Department Reason for ED Visit: Other: How have you been since you were released from the hospital?: Better Any questions or concerns?: No  Items Reviewed: Did you receive and understand the discharge instructions provided?: Yes Medications obtained,verified, and reconciled?: Yes (Medications Reviewed) Any new allergies since your discharge?: No Dietary orders reviewed?: No Do you have support at home?: Yes People in Home [RPT]: spouse  Medications Reviewed Today: Medications Reviewed Today   Medications were not reviewed in this encounter     Home Care and Equipment/Supplies: Were Home Health Services Ordered?: No Any new equipment or medical supplies ordered?: No  Functional Questionnaire: Do you need assistance with bathing/showering or dressing?: No Do you need assistance with meal preparation?: No Do you need assistance with eating?: No Do you have difficulty maintaining continence: No Do you need assistance with getting out of bed/getting out of a chair/moving?: No Do you have difficulty managing or taking your medications?: No  Follow up appointments reviewed: PCP Follow-up appointment confirmed?: Yes Date of PCP follow-up appointment?: 10/27/24 Follow-up Provider: [facility] Follow-up appointment confirmed?: Yes Date of Specialist follow-up appointment?: 10/22/24 Follow-Up Specialty Provider:: ORTHO Do you need transportation to your follow-up appointment?: No Do you understand care options if your condition(s) worsen?: Yes-patient verbalized  understanding    SIGNATURE Kristeen Lunger, CMA  "

## 2024-10-21 NOTE — Progress Notes (Unsigned)
 "  Subjective:    Patient ID: Ryan Craig, male    DOB: 1946-03-22, 78 y.o.   MRN: [id number]  HPI: Ryan Craig is a 79 y.o. male who returns for follow up appointment for chronic pain and medication refill. states *** pain is located in  ***. rates pain ***. current exercise regime is walking and performing stretching exercises.  [name] Morphine  equivalent is *** MME.        Pain Inventory Average Pain 10 Pain Right Now 10 My pain is dull and aching  In the last 24 hours, has pain interfered with the following? General activity 10 Relation with others 5 Enjoyment of life 8 What TIME of day is your pain at its worst? daytime Sleep (in general) Fair  Pain is worse with: walking, bending, and sitting Pain improves with: rest Relief from Meds: 8  Family History  Problem Relation Age of Onset   Heart attack Mother    Cancer Maternal Aunt    Cancer Paternal Aunt    Cancer Cousin    Social History   Socioeconomic History   Marital status: Married    Spouse name: Not on file   Number of children: Not on file   Years of education: Not on file   Highest education level: Not on file  Occupational History   Occupation: retired  Tobacco Use   Smoking status: Former    Passive exposure: Past   Smokeless tobacco: Former   Tobacco comments:    quit smoking about 6-7ytrs ago  Advertising Account Planner   Vaping status: Never Used  Substance and Sexual Activity   Alcohol use: No    Comment: drank over 40 years ago   Drug use: Not Currently    Types: Hydrocodone    Sexual activity: Not Currently  Other Topics Concern   Not on file  Social History Narrative   Not on file   Social Drivers of Health   Tobacco Use: Medium Risk (10/18/2024)   Patient History    Smoking Tobacco Use: Former    Smokeless Tobacco Use: Former    Passive Exposure: Past  Physicist, Medical Strain: Low Risk (12/26/2023)   Overall Financial Resource Strain (CARDIA)    Difficulty of Paying Living Expenses:  Not hard at all  Food Insecurity: No Food Insecurity (12/26/2023)   Hunger Vital Sign    Worried About Running Out of Food in the Last Year: Never true    Ran Out of Food in the Last Year: Never true  Transportation Needs: No Transportation Needs (12/26/2023)   PRAPARE - Administrator, Civil Service (Medical): No    Lack of Transportation (Non-Medical): No  Physical Activity: Inactive (12/26/2023)   Exercise Vital Sign    Days of Exercise per Week: 0 days    Minutes of Exercise per Session: 0 min  Stress: No Stress Concern Present (12/26/2023)   [employer] of Occupational Health - Occupational Stress Questionnaire    Feeling of Stress : Not at all  Social Connections: Moderately Integrated (12/26/2023)   Social Connection and Isolation Panel    Frequency of Communication with Friends and Family: More than three times a week    Frequency of Social Gatherings with Friends and Family: Once a week    Attends Religious Services: More than 4 times per year    Active Member of [employer] or Organizations: No    Attends Banker Meetings: Never    Marital Status: Married  Depression (PHQ2-9): Low  Risk (04/02/2024)   Depression (PHQ2-9)    PHQ-2 Score: 0  Alcohol Screen: Low Risk (12/26/2023)   Alcohol Screen    Last Alcohol Screening Score (AUDIT): 0  Housing: Unknown (12/26/2023)   Housing Stability Vital Sign    Unable to Pay for Housing in the Last Year: No    Number of Times Moved in the Last Year: Not on file    Homeless in the Last Year: No  Utilities: Not At Risk (12/26/2023)   AHC Utilities    Threatened with loss of utilities: No  Health Literacy: Adequate Health Literacy (12/26/2023)   B1300 Health Literacy    Frequency of need for help with medical instructions: Never   Past Surgical History:  Procedure Laterality Date   arthroscopic knee surgery Right    COLONOSCOPY     INGUINAL HERNIA REPAIR Bilateral    x 4   JOINT REPLACEMENT Right 6 yrs ago   knee    KNEE ARTHROSCOPY Left 02/24/2014   Procedure: LEFT KNEE ARTHROSCOPY WITH PARTIAL MEDIAL MENISCECTOMY;  Surgeon: Lonni CINDERELLA Poli, MD;  Location: [facility] OR;  Service: Orthopedics;  Laterality: Left;   LEFT HEART CATH AND CORONARY ANGIOGRAPHY N/A 07/22/2020   Procedure: LEFT HEART CATH AND CORONARY ANGIOGRAPHY;  Surgeon: Verlin Lonni BIRCH, MD;  Location: [facility] IN[facility];  Service: Cardiovascular;  Laterality: N/A;   TOTAL KNEE ARTHROPLASTY Left 12/22/2014   Procedure: LEFT TOTAL KNEE ARTHROPLASTY;  Surgeon: Lonni CINDERELLA Poli, MD;  Location: [facility] OR;  Service: Orthopedics;  Laterality: Left;   Past Surgical History:  Procedure Laterality Date   arthroscopic knee surgery Right    COLONOSCOPY     INGUINAL HERNIA REPAIR Bilateral    x 4   JOINT REPLACEMENT Right 6 yrs ago   knee   KNEE ARTHROSCOPY Left 02/24/2014   Procedure: LEFT KNEE ARTHROSCOPY WITH PARTIAL MEDIAL MENISCECTOMY;  Surgeon: Lonni CINDERELLA Poli, MD;  Location: [facility] OR;  Service: Orthopedics;  Laterality: Left;   LEFT HEART CATH AND CORONARY ANGIOGRAPHY N/A 07/22/2020   Procedure: LEFT HEART CATH AND CORONARY ANGIOGRAPHY;  Surgeon: Verlin Lonni BIRCH, MD;  Location: [facility];  Service: Cardiovascular;  Laterality: N/A;   TOTAL KNEE ARTHROPLASTY Left 12/22/2014   Procedure: LEFT TOTAL KNEE ARTHROPLASTY;  Surgeon: Lonni CINDERELLA Poli, MD;  Location: [facility] OR;  Service: Orthopedics;  Laterality: Left;   Past Medical History:  Diagnosis Date   Anemia    no on any meds   Arthritis    Bifascicular block    Chronic back pain    deteriorating   Hyperlipidemia    Hypertension    Joint pain    Joint swelling    Mild CAD    Myocarditis (HCC)    PAT (paroxysmal atrial tachycardia)    Premature atrial contractions    PVC's (premature ventricular contractions)    Thoracic aortic aneurysm (TAA)    There were no vitals taken for this visit.  Opioid Risk Score:   Fall Risk Score:  `1  Depression screen [facility]  2/9     04/02/2024    9:01 AM 02/04/2024    9:01 AM 12/26/2023    9:08 AM 12/11/2023    8:49 AM 10/08/2023    9:01 AM 08/28/2023    8:53 AM 08/20/2023   10:59 AM  Depression screen PHQ 2/9  Decreased Interest 0 0 0 0 0 1 3  Down, Depressed, Hopeless 0 0 0 0 0 1 3  PHQ - 2 Score 0  0 0 0 0 2 6  Altered sleeping 0  0   0 3  Tired, decreased energy 0  0   1 2  Change in appetite 0  0   3 3  Feeling bad or failure about yourself  0  0   1 3  Trouble concentrating 0  0   0 2  Moving slowly or fidgety/restless 0  0   0 0  Suicidal thoughts 0  0   0 0  PHQ-9 Score 0   0    7  19   Difficult doing work/chores   Not difficult at all   Not difficult at all      Data saved with a previous flowsheet row definition    Review of Systems  Musculoskeletal:  Positive for back pain and gait problem.       Pain in both knees, right arm & wrist  All other systems reviewed and are negative.      Objective:   Physical Exam        Assessment & Plan:  Chronic Bilateral low back pain with left sided sciatica: Continue HEP as tolerated. Continue to Monitor. 08/19/2024 Impaired Gait Mobility: Continue to use assistive device with his cane. 08/19/2024 Chronic Pain Syndrome: Continue : Hydrocodone  5/325 mg daily, using it sparingly, and  Refilled: Tramadol  50 mg one tablet 4 times  a day as needed for pain.#120. May take two tablets twice a day, do not exceed 4 tablets daily.  We will continue the opioid monitoring program, this consists of regular clinic visits, examinations, urine drug screen, pill counts as well as use of [place]  Controlled Substance Reporting system. A 12 month History has been reviewed on the [place]  Controlled Substance Reporting System on 08/19/2024 Bilateral  Knee Pain: Continue HEP as Tolerated. Continue to Monitor. 08/19/2024 5.  Fall at Home subsequent encounter: No Falls this month Continue HEP as tolerated. Continue to Monitor. 08/19/2024   F/U in 2 months      "

## 2024-10-21 NOTE — Transitions of Care (Post Inpatient/ED Visit) (Signed)
" ° °  10/21/2024  Name: Ryan Craig MRN: [id number] DOB: Jan 13, 1946  Today's TOC FU Call Status: Today's TOC FU Call Status:: Unsuccessful Call (1st Attempt) Unsuccessful Call (1st Attempt) Date: 10/21/24  Attempted to reach the patient regarding the most recent Inpatient/ED visit.  Follow Up Plan: Additional outreach attempts will be made to reach the patient to complete the Transitions of Care (Post Inpatient/ED visit) call.   Signature  Kristeen Lunger, CMA  "

## 2024-10-22 ENCOUNTER — Other Ambulatory Visit: Payer: Self-pay | Admitting: Nurse Practitioner

## 2024-10-22 DIAGNOSIS — R9089 Other abnormal findings on diagnostic imaging of central nervous system: Secondary | ICD-10-CM

## 2024-10-27 ENCOUNTER — Ambulatory Visit: Payer: Self-pay | Admitting: Nurse Practitioner

## 2024-11-03 ENCOUNTER — Encounter: Payer: Self-pay | Admitting: Nurse Practitioner

## 2024-11-03 ENCOUNTER — Ambulatory Visit: Admitting: Nurse Practitioner

## 2024-11-03 VITALS — BP 110/70 | HR 75 | Temp 98.1°F | Ht 67.0 in | Wt 142.4 lb

## 2024-11-03 DIAGNOSIS — W19XXXD Unspecified fall, subsequent encounter: Secondary | ICD-10-CM

## 2024-11-03 DIAGNOSIS — S62101D Fracture of unspecified carpal bone, right wrist, subsequent encounter for fracture with routine healing: Secondary | ICD-10-CM

## 2024-11-03 DIAGNOSIS — G8929 Other chronic pain: Secondary | ICD-10-CM

## 2024-11-03 DIAGNOSIS — Z09 Encounter for follow-up examination after completed treatment for conditions other than malignant neoplasm: Secondary | ICD-10-CM

## 2024-11-03 DIAGNOSIS — K5903 Drug induced constipation: Secondary | ICD-10-CM

## 2024-11-03 NOTE — Addendum Note (Signed)
 Addended by: GEORGINA SPEAKS F on: 11/03/2024 10:16 AM   Modules accepted: Orders

## 2024-11-03 NOTE — Progress Notes (Signed)
 LILLETTE Kristeen JINNY Gladis, CMA,acting as a neurosurgeon for Ryan Ada, FNP.,have documented all relevant documentation on the behalf of Ryan Ada, FNP,as directed by  Ryan Ada, FNP while in the presence of Ryan Ada, FNP.  Subjective:  Patient ID: Ryan Craig , male    DOB: January 06, 1946 , 79 y.o.   MRN: [id number]  Chief Complaint  Patient presents with   Hospitalization Follow-up    Patient presents today for a hospital follow up, patient reports compliance with medications. Patient denies any chest pain, SOB, or headaches. Patient was admitted 10/18/2024 and discharged on 10/19/2024. Patient was diagnosed with Fall. Patient also has Closed fracture of right wrist. Patient reports today they are feeling about the same, he is still having a lot of pain in his right wrist and right shoulder. Patient reports he tripped over something.   Head Injury    Patient was supposed to have another scan of his head done but he is allergic to the contrast they need a new order to be done at the hospital.      HPI  Discussed the use of AI scribe software for clinical note transcription with the patient, who gave verbal consent to proceed.  History of Present Illness Ryan Craig is an 79 year old male who presents with a fall resulting in a broken arm and head injury.  He fell in his yard on a brick, resulting in a broken arm and a head injury. No loss of consciousness occurred during the fall. He has been evaluated by an orthopedic specialist and is currently wearing a splint on his arm.  He experiences pain in his wrist and is currently taking Sotol and hydrocodone  for pain management. He has a history of back pain and is under the care of a pain management doctor.  He reports occasional difficulty breathing, described as 'breathing hard,' but denies shortness of breath.  He experiences constipation, which he attributes to his pain medication. He takes Senokot as needed, but not daily, and has tried  Linzess without success. He also uses milk of magnesia occasionally. He drinks about two bottles of water a day, which is less than he used to.  He has a known allergy that prevents him from undergoing an MRI. He requires further imaging for evaluation. A CT scan has been performed, but further imaging is required.  Past Medical History:  Diagnosis Date   Anemia    no on any meds   Arthritis    Bifascicular block    Chronic back pain    deteriorating   Hyperlipidemia    Hypertension    Joint pain    Joint swelling    Mild CAD    Myocarditis (HCC)    PAT (paroxysmal atrial tachycardia)    Premature atrial contractions    PVC's (premature ventricular contractions)    Thoracic aortic aneurysm (TAA)      Family History  Problem Relation Age of Onset   Heart attack Mother    Cancer Maternal Aunt    Cancer Paternal Aunt    Cancer Cousin     Current Medications[1]   Allergies[2]   Review of Systems  Constitutional:  Negative for unexpected weight change.  Eyes: Negative.   Respiratory:  Negative for shortness of breath.   Cardiovascular:  Negative for chest pain and palpitations.  Musculoskeletal: Negative.   Skin: Negative.   Neurological:  Negative for headaches.  Psychiatric/Behavioral: Negative.       Today's Vitals   11/03/24 0955  BP: 110/70  Pulse: 75  Temp: 98.1 F (36.7 C)  TempSrc: Oral  Weight: 142 lb 6.4 oz (64.6 kg)  Height: 5' 7 (1.702 m)  PainSc: 9   PainLoc: Wrist   Body mass index is 22.3 kg/m.  Wt Readings from Last 3 Encounters:  11/03/24 142 lb 6.4 oz (64.6 kg)  10/21/24 144 lb (65.3 kg)  10/19/24 140 lb (63.5 kg)      Objective:  Physical Exam Vitals and nursing note reviewed.  Constitutional:      Appearance: Normal appearance.  HENT:     Head: Normocephalic and atraumatic.  Eyes:     Conjunctiva/sclera: Conjunctivae normal.     Pupils: Pupils are equal, round, and reactive to light.  Cardiovascular:     Rate and Rhythm:  Normal rate and regular rhythm.     Pulses: Normal pulses.     Heart sounds: Normal heart sounds. No murmur heard. Pulmonary:     Effort: Pulmonary effort is normal. No respiratory distress.     Breath sounds: Normal breath sounds. No wheezing.  Musculoskeletal:        General: Normal range of motion.     Cervical back: Normal range of motion.     Comments: Right wrist splint on  Skin:    General: Skin is warm and dry.     Capillary Refill: Capillary refill takes 2 to 3 seconds.  Neurological:     Mental Status: He is alert.  Psychiatric:        Mood and Affect: Mood normal.        Behavior: Behavior normal.        Thought Content: Thought content normal.         Assessment And Plan:   Assessment & Plan Fall, subsequent encounter Resulted in a closed wrist fracture. CT scan performed, MRI needed for pituitary evaluation due to contrast allergy. - Ordered MRI to evaluate the pituitary gland. - Advised to avoid yard activities to prevent further falls. Closed fracture of right wrist with routine healing, subsequent encounter Conservatively managed with a brace, no surgery needed. Pain controlled with hydrocodone  and tramadol . - Continue wearing the brace as instructed by orthopedics. - Continue pain management with hydrocodone  and tramadol  as prescribed. Hospital discharge follow-up TCM Performed. A member of the clinical team spoke with the patient upon dischare. Discharge summary was reviewed in full detail during the visit. Meds reconciled and compared to discharge meds. Medication list is updated and reviewed with the patient.  Greater than 50% face to face time was spent in counseling an coordination of care.  All questions were answered to the satisfaction of the patient.    Drug-induced constipation Constipation likely due to opioid use. Senokot effective, Linzess not as expected. - Take Senokot every other day to regulate bowel movements. - Increase water intake to at  least four bottles per day. - Consult with Doctor Rollin regarding the effectiveness of Linzess. Chronic bilateral low back pain with left-sided sciatica Managed with hydrocodone  and tramadol . Pain worsens in cold weather. - Continue current pain management regimen with hydrocodone  and tramadol .  No orders of the defined types were placed in this encounter.    Return for keep same next.  Patient was given opportunity to ask questions. Patient verbalized understanding of the plan and was able to repeat key elements of the plan. All questions were answered to their satisfaction.    LILLETTE Ryan Ada, FNP, have reviewed all documentation for this visit. The documentation  on 11/03/24 for the exam, diagnosis, procedures, and orders are all accurate and complete.   IF YOU HAVE BEEN REFERRED TO A SPECIALIST, IT MAY TAKE 1-2 WEEKS TO SCHEDULE/PROCESS THE REFERRAL. IF YOU HAVE NOT HEARD FROM US /SPECIALIST IN TWO WEEKS, PLEASE GIVE US  A CALL AT [phone].      [1]  Current Outpatient Medications:    acetaminophen  (TYLENOL ) 500 MG tablet, Take 500 mg by mouth every 6 (six) hours as needed., Disp: , Rfl:    amLODipine  (NORVASC ) 5 MG tablet, Take 1 tablet (5 mg total) by mouth daily., Disp: 90 tablet, Rfl: 3   aspirin  EC 81 MG tablet, Take 81 mg by mouth daily., Disp: , Rfl:    BLACK CURRANT SEED OIL PO, Take by mouth., Disp: , Rfl:    Cholecalciferol (VITAMIN D -3 PO), Take 2 tablets by mouth daily. , Disp: , Rfl:    HYDROcodone -acetaminophen  (NORCO) 5-325 MG tablet, Take 1 tablet by mouth daily as needed for severe pain (pain score 7-10). Use as needed once daily for breakthrough pain; you are only receiving 15 tablets for the month and are responsible for budgeting them; early refills of this medication will not be permitted. Do Not Fill Before 10/25/24, Disp: 15 tablet, Rfl: 0   metoprolol  succinate (TOPROL -XL) 25 MG 24 hr tablet, Take 1 tablet (25 mg total) by mouth daily., Disp: 90 tablet,  Rfl: 3   rosuvastatin  (CRESTOR ) 40 MG tablet, TAKE 1 TABLET BY MOUTH EVERY DAY, Disp: 90 tablet, Rfl: 0   traMADol  (ULTRAM ) 50 MG tablet, Take 1 tablet (50 mg total) by mouth 4 (four) times daily. You may take up to 2 tablets at once, but do not exceed 4 tablets daily, Disp: 120 tablet, Rfl: 0   triamcinolone  cream (KENALOG ) 0.1 %, APPLY TOPICALLY A THIN LAYER TO THE AFFECTED AREA(S) TWICE A DAY, Disp: 454 g, Rfl: 0   vitamin B-12 (CYANOCOBALAMIN ) 100 MCG tablet, TAKE 1 TABLET BY MOUTH EVERY DAY, Disp: 90 tablet, Rfl: 1   lisinopril  (ZESTRIL ) 10 MG tablet, TAKE 1 TABLET BY MOUTH EVERY DAY, Disp: 90 tablet, Rfl: 1 [2]  Allergies Allergen Reactions   Contrast Media [Iodinated Contrast Media] Anaphylaxis   Latex Rash and Itching

## 2024-11-05 ENCOUNTER — Other Ambulatory Visit: Payer: Self-pay | Admitting: Nurse Practitioner

## 2024-11-05 DIAGNOSIS — I10 Essential (primary) hypertension: Secondary | ICD-10-CM

## 2024-11-09 DIAGNOSIS — Z09 Encounter for follow-up examination after completed treatment for conditions other than malignant neoplasm: Secondary | ICD-10-CM | POA: Insufficient documentation

## 2024-11-09 DIAGNOSIS — S62101A Fracture of unspecified carpal bone, right wrist, initial encounter for closed fracture: Secondary | ICD-10-CM | POA: Insufficient documentation

## 2024-11-09 DIAGNOSIS — W19XXXA Unspecified fall, initial encounter: Secondary | ICD-10-CM | POA: Insufficient documentation

## 2024-11-09 NOTE — Assessment & Plan Note (Signed)
 Resulted in a closed wrist fracture. CT scan performed, MRI needed for pituitary evaluation due to contrast allergy. - Ordered MRI to evaluate the pituitary gland. - Advised to avoid yard activities to prevent further falls.

## 2024-11-09 NOTE — Assessment & Plan Note (Signed)
 TCM Performed. A member of the clinical team spoke with the patient upon dischare. Discharge summary was reviewed in full detail during the visit. Meds reconciled and compared to discharge meds. Medication list is updated and reviewed with the patient.  Greater than 50% face to face time was spent in counseling an coordination of care.  All questions were answered to the satisfaction of the patient.

## 2024-11-09 NOTE — Assessment & Plan Note (Signed)
 Conservatively managed with a brace, no surgery needed. Pain controlled with hydrocodone  and tramadol . - Continue wearing the brace as instructed by orthopedics. - Continue pain management with hydrocodone  and tramadol  as prescribed.

## 2024-11-09 NOTE — Assessment & Plan Note (Signed)
 Managed with hydrocodone  and tramadol . Pain worsens in cold weather. - Continue current pain management regimen with hydrocodone  and tramadol .

## 2024-11-12 ENCOUNTER — Ambulatory Visit (HOSPITAL_COMMUNITY)
Admission: RE | Admit: 2024-11-12 | Discharge: 2024-11-12 | Disposition: A | Discharge status: Home / Self Care | Source: Ambulatory Visit | Attending: Nurse Practitioner | Admitting: Nurse Practitioner

## 2024-11-12 DIAGNOSIS — R9089 Other abnormal findings on diagnostic imaging of central nervous system: Secondary | ICD-10-CM | POA: Insufficient documentation

## 2024-11-12 DIAGNOSIS — Z8673 Personal history of transient ischemic attack (TIA), and cerebral infarction without residual deficits: Secondary | ICD-10-CM

## 2024-11-24 ENCOUNTER — Ambulatory Visit: Payer: Self-pay | Admitting: Nurse Practitioner

## 2024-12-23 ENCOUNTER — Encounter: Admitting: Registered Nurse

## 2025-01-28 ENCOUNTER — Ambulatory Visit: Payer: Self-pay | Admitting: Nurse Practitioner

## 2025-01-28 ENCOUNTER — Ambulatory Visit

## 2025-01-28 ENCOUNTER — Ambulatory Visit: Payer: Self-pay
